# Patient Record
Sex: Female | Born: 1960 | Race: White | Hispanic: No | Marital: Married | Smoking: Former smoker
Health system: Southern US, Community
[De-identification: ages and names within clinical notes are randomized; demographics above are authoritative.]

## PROBLEM LIST (undated history)

## (undated) DIAGNOSIS — I639 Cerebral infarction, unspecified: Secondary | ICD-10-CM

## (undated) DIAGNOSIS — G709 Myoneural disorder, unspecified: Secondary | ICD-10-CM

## (undated) DIAGNOSIS — I4891 Unspecified atrial fibrillation: Secondary | ICD-10-CM

## (undated) DIAGNOSIS — H518 Other specified disorders of binocular movement: Secondary | ICD-10-CM

## (undated) DIAGNOSIS — I10 Essential (primary) hypertension: Secondary | ICD-10-CM

## (undated) DIAGNOSIS — F419 Anxiety disorder, unspecified: Secondary | ICD-10-CM

## (undated) DIAGNOSIS — K219 Gastro-esophageal reflux disease without esophagitis: Secondary | ICD-10-CM

## (undated) DIAGNOSIS — R51 Headache: Secondary | ICD-10-CM

## (undated) DIAGNOSIS — M1990 Unspecified osteoarthritis, unspecified site: Secondary | ICD-10-CM

## (undated) DIAGNOSIS — G8929 Other chronic pain: Secondary | ICD-10-CM

## (undated) DIAGNOSIS — F410 Panic disorder [episodic paroxysmal anxiety] without agoraphobia: Secondary | ICD-10-CM

## (undated) DIAGNOSIS — R519 Headache, unspecified: Secondary | ICD-10-CM

## (undated) DIAGNOSIS — M542 Cervicalgia: Secondary | ICD-10-CM

## (undated) DIAGNOSIS — F329 Major depressive disorder, single episode, unspecified: Secondary | ICD-10-CM

## (undated) DIAGNOSIS — J45909 Unspecified asthma, uncomplicated: Secondary | ICD-10-CM

## (undated) HISTORY — PX: CERVICAL SPINE SURGERY: SHX589

## (undated) HISTORY — PX: BACK SURGERY: SHX140

## (undated) HISTORY — DX: Cerebral infarction, unspecified: I63.9

## (undated) HISTORY — PX: CHOLECYSTECTOMY: SHX55

---

## 2000-04-01 DIAGNOSIS — F32A Depression, unspecified: Secondary | ICD-10-CM

## 2000-04-01 DIAGNOSIS — F410 Panic disorder [episodic paroxysmal anxiety] without agoraphobia: Secondary | ICD-10-CM

## 2000-04-01 HISTORY — DX: Depression, unspecified: F32.A

## 2000-04-01 HISTORY — DX: Panic disorder (episodic paroxysmal anxiety): F41.0

## 2006-11-04 ENCOUNTER — Ambulatory Visit (HOSPITAL_COMMUNITY): Admission: RE | Admit: 2006-11-04 | Discharge: 2006-11-04 | Payer: Self-pay | Admitting: Family Medicine

## 2006-11-04 ENCOUNTER — Encounter: Payer: Self-pay | Admitting: Orthopedic Surgery

## 2007-03-03 ENCOUNTER — Encounter: Payer: Self-pay | Admitting: Orthopedic Surgery

## 2007-03-03 ENCOUNTER — Ambulatory Visit (HOSPITAL_COMMUNITY): Admission: RE | Admit: 2007-03-03 | Discharge: 2007-03-03 | Payer: Self-pay | Admitting: Family Medicine

## 2007-05-11 ENCOUNTER — Ambulatory Visit: Payer: Self-pay | Admitting: Orthopedic Surgery

## 2007-05-11 DIAGNOSIS — M5430 Sciatica, unspecified side: Secondary | ICD-10-CM

## 2007-05-11 DIAGNOSIS — M25569 Pain in unspecified knee: Secondary | ICD-10-CM | POA: Insufficient documentation

## 2007-05-11 DIAGNOSIS — IMO0002 Reserved for concepts with insufficient information to code with codable children: Secondary | ICD-10-CM | POA: Insufficient documentation

## 2007-05-11 DIAGNOSIS — M2240 Chondromalacia patellae, unspecified knee: Secondary | ICD-10-CM

## 2007-05-15 ENCOUNTER — Ambulatory Visit (HOSPITAL_COMMUNITY): Admission: RE | Admit: 2007-05-15 | Discharge: 2007-05-15 | Payer: Self-pay | Admitting: Family Medicine

## 2008-06-03 ENCOUNTER — Ambulatory Visit (HOSPITAL_COMMUNITY): Admission: RE | Admit: 2008-06-03 | Discharge: 2008-06-03 | Payer: Self-pay | Admitting: Family Medicine

## 2008-06-03 ENCOUNTER — Encounter: Payer: Self-pay | Admitting: Orthopedic Surgery

## 2008-06-08 ENCOUNTER — Ambulatory Visit: Payer: Self-pay | Admitting: Orthopedic Surgery

## 2008-08-04 ENCOUNTER — Ambulatory Visit: Payer: Self-pay | Admitting: Orthopedic Surgery

## 2008-10-05 ENCOUNTER — Emergency Department (HOSPITAL_COMMUNITY): Admission: EM | Admit: 2008-10-05 | Discharge: 2008-10-05 | Payer: Self-pay | Admitting: Emergency Medicine

## 2008-11-21 ENCOUNTER — Ambulatory Visit (HOSPITAL_COMMUNITY): Admission: RE | Admit: 2008-11-21 | Discharge: 2008-11-21 | Payer: Self-pay | Admitting: Family Medicine

## 2009-05-31 ENCOUNTER — Ambulatory Visit: Payer: Self-pay | Admitting: Orthopedic Surgery

## 2009-06-12 ENCOUNTER — Telehealth: Payer: Self-pay | Admitting: Orthopedic Surgery

## 2009-08-24 ENCOUNTER — Ambulatory Visit: Payer: Self-pay | Admitting: Orthopedic Surgery

## 2009-10-12 ENCOUNTER — Emergency Department (HOSPITAL_COMMUNITY): Admission: EM | Admit: 2009-10-12 | Discharge: 2009-10-12 | Payer: Self-pay | Admitting: Emergency Medicine

## 2010-05-01 NOTE — Assessment & Plan Note (Signed)
Summary: reck knees/medicare/medicaid/bsf   Visit Type:  Follow-up Referring Provider:  self Primary Provider:  Dr. Janna Arch  CC:  recheck knees.  History of Present Illness: Followup bilateral knee pain in this 50 year old female with previous injections in both knees. History of lumbar disc disease at multiple back surgeries, and epidural injections, complicated by epidural leak requiring a patch.  Has dx of chondromalacia patella, was given relafen last visit.  complains of anterior knee pain and pain with squatting in the front of the knee,  She is about 50% improvement with Relafen 500 mg after 8 weeks. Still some stiffness, and pain in both knees.  System review there is some nerve damage in the RIGHT knee  Meds: Prilosec, Lyrica, Zantac, Losartan HCTZ, Vicodin no relief of knee pain, Ambien, Relafen for knees.  Tomorrow is 2 weeks of not smoking.    Allergies: No Known Drug Allergies  Physical Exam  Skin:  intact without lesions or rashes Psych:  alert and cooperative; normal mood and affect; normal attention span and concentration   Knee Exam  General:    Well-developed, well-nourished, normal body habitus; no deformities, normal grooming.  Gait:    Normal heel-toe gait pattern bilaterally.    Skin:    Intact, no scars, lesions, rashes, cafe au lait spots, or bruising.    Inspection:     No deformity, ecchymosis or swelling.   Palpation:    Non-tender to palpation over medial joint line, lateral joint line, parapatellar, condylar, patellar tendon, or Pes bursa.   Vascular:    There was no swelling or varicose veins. The pulses and temperature are normal. There was no edema or tenderness.  Motor:    Motor strength 5/5 bilaterally for quadriceps, hamstrings, ankle dorsiflexion, and ankle plantar flexion.    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 One by mouth bid  Orders: Est. Patient Level III (62703)  Medications Added to Medication List This Visit: 1)  Nabumetone 750 Mg Tabs (Nabumetone) .... One by mouth bid  Patient Instructions: 1)  continue taking Relafen increase to 750 two times a day 2)  come back in 3 months recheck after med increase Prescriptions: NABUMETONE 750 MG TABS (NABUMETONE) one by mouth bid  #60 x 3   Entered and Authorized by:   [name] MD   Signed by:   [name] MD on 08/24/2009   Method used:   Faxed to ...       [facility]  [address] [address]       [address], [place]  [address]       Ph: [phone]       Fax: [phone]   RxID:   [id number]

## 2010-05-01 NOTE — Assessment & Plan Note (Signed)
Summary: BI KNEE PAIN NEEDS XR/MEDICARE/MEDICAID/BSF   Visit Type:  Follow-up Referring Provider:  self Primary Provider:  Dr. Janna Arch  CC:  bilateral knee pain.  History of Present Illness: followup bilateral knee pain in this 50 year old female with previous injections in both knees. History of lumbar disc disease at multiple back surgeries, and epidural injections, complicated by epidural leak requiring a patch.  Last injections were done on Aug 04, 2008   complains of anterior knee pain and pain with squatting in the front of the knee, has become and has been severe  Review of systems:  there is some nerve damage in the RIGHT knee  Meds: Prilosec, Lyrica, Zantac, Losartan HCTZ, Vicodin no relief of knee pain, Ambien, Naproxen no relief for knees.   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  Past Medical History: Reviewed history from 05/11/2007 and no changes required. hiatal hernia acid reflux high blood pressure  Past Surgical History: Reviewed history from 05/11/2007 and no changes required. none  Family History: Reviewed history from 05/11/2007 and no changes required. Family History of Arthritis FH of Cancer:  Family History of Diabetes  Social History: Reviewed history from 05/11/2007 and no changes required. Patient is single.  housewife  Physical Exam  Additional Exam:  examination reveals a small framed, female, awake, alert, on a x3. Mood and affect. Normal. No gait disturbance.  Inspection revealed bilateral crepitance in both knees. No joint  effusion. No joint line tenderness.  Range of motion remains full. Knee is stable. Strength is normal. Skin is normal.  Sensory exam shows no major deficits. Pulses are excellent.     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  The tibiofemoral articulations are well preserved with mild medial joint space narrowing, the patellofemoral joints look normal.  Impression mild medial joint space narrowing from arthritis.  Her updated medication list for this problem includes:    Nabumetone 500 Mg Tabs (Nabumetone) ..... One by mouth bid  Orders: Est. Patient Level III (04540) Knee x-ray,  3 views (98119) change from Naprosyn to Relafen. No surgical treatment really to address the chondromalacia.  Medications Added to Medication List This Visit: 1)  Nabumetone 500 Mg Tabs (Nabumetone) .... One by mouth bid  Patient Instructions: 1)  Stop Naprosyn 2)  Start new med for 8 weeks 3)  come back in 8 weeks for recheck on knees Prescriptions: NABUMETONE 500 MG TABS (NABUMETONE) one by mouth bid  #60 x 2   Entered and Authorized by:   [name] MD   Signed by:   [name] MD on 05/31/2009   Method used:   Print then Give to Patient   RxID:   [id number]

## 2010-05-01 NOTE — Progress Notes (Signed)
Summary: Medicine not helping her pain  Phone Note Call from Patient   Summary of Call: Alyssa Poole (December 20, 2060) says the Nabumetone 500 mg is not helping her pain, she takes 2 a day.  Wants to know if she needs to come in sooner than her May 4,2011 appointment or can you prescribe something else.  [facility] [place] ph# [phone] Her # [phone] Initial call taken by: Jacklynn Ganong,  June 12, 2009 8:22 AM  Follow-up for Phone Call        no she does not have a surgical problem and will have to deal with the pain  Follow-up by: [name] MD,  June 12, 2009 8:44 AM  Additional Follow-up for Phone Call Additional follow up Details #1::        Left a message for [facility] to call the office Additional Follow-up by: Jacklynn Ganong,  June 12, 2009 9:12 AM

## 2010-07-03 ENCOUNTER — Emergency Department (HOSPITAL_COMMUNITY)
Admission: EM | Admit: 2010-07-03 | Discharge: 2010-07-03 | Disposition: A | Discharge status: Home / Self Care | Payer: Medicare Other | Attending: Emergency Medicine | Admitting: Emergency Medicine

## 2010-07-03 ENCOUNTER — Emergency Department (HOSPITAL_COMMUNITY): Payer: Medicare Other

## 2010-07-03 DIAGNOSIS — K219 Gastro-esophageal reflux disease without esophagitis: Secondary | ICD-10-CM | POA: Insufficient documentation

## 2010-07-03 DIAGNOSIS — E119 Type 2 diabetes mellitus without complications: Secondary | ICD-10-CM | POA: Insufficient documentation

## 2010-07-03 DIAGNOSIS — I10 Essential (primary) hypertension: Secondary | ICD-10-CM | POA: Insufficient documentation

## 2010-07-03 DIAGNOSIS — M1710 Unilateral primary osteoarthritis, unspecified knee: Secondary | ICD-10-CM | POA: Insufficient documentation

## 2010-07-03 DIAGNOSIS — Z79899 Other long term (current) drug therapy: Secondary | ICD-10-CM | POA: Insufficient documentation

## 2010-07-03 DIAGNOSIS — M25569 Pain in unspecified knee: Secondary | ICD-10-CM | POA: Insufficient documentation

## 2010-07-08 LAB — DIFFERENTIAL
Eosinophils Absolute: 0.1 10*3/uL (ref 0.0–0.7)
Eosinophils Relative: 2 % (ref 0–5)
Monocytes Relative: 11 % (ref 3–12)
Neutro Abs: 3.2 10*3/uL (ref 1.7–7.7)
Neutrophils Relative %: 57 % (ref 43–77)

## 2010-07-08 LAB — URINE CULTURE: Colony Count: 30000

## 2010-07-08 LAB — COMPREHENSIVE METABOLIC PANEL
ALT: 19 U/L (ref 0–35)
BUN: 10 mg/dL (ref 6–23)
CO2: 26 mEq/L (ref 19–32)
Creatinine, Ser: 0.61 mg/dL (ref 0.4–1.2)
Sodium: 138 mEq/L (ref 135–145)
Total Bilirubin: 0.4 mg/dL (ref 0.3–1.2)

## 2010-07-08 LAB — CBC
HCT: 33.5 % — ABNORMAL LOW (ref 36.0–46.0)
Hemoglobin: 11.9 g/dL — ABNORMAL LOW (ref 12.0–15.0)
MCHC: 35.7 g/dL (ref 30.0–36.0)
MCV: 96.5 fL (ref 78.0–100.0)
Platelets: 267 10*3/uL (ref 150–400)
RBC: 3.47 MIL/uL — ABNORMAL LOW (ref 3.87–5.11)
RDW: 12.9 % (ref 11.5–15.5)
WBC: 5.6 10*3/uL (ref 4.0–10.5)

## 2010-07-08 LAB — URINALYSIS, ROUTINE W REFLEX MICROSCOPIC
Nitrite: NEGATIVE
Protein, ur: NEGATIVE mg/dL
Specific Gravity, Urine: 1.005 (ref 1.005–1.030)

## 2010-07-08 LAB — POCT CARDIAC MARKERS: Troponin i, poc: <0.05 ng/mL (ref 0.00–0.09)

## 2010-08-23 ENCOUNTER — Emergency Department (HOSPITAL_COMMUNITY)
Admission: EM | Admit: 2010-08-23 | Discharge: 2010-08-23 | Disposition: A | Discharge status: Home / Self Care | Payer: Medicare Other | Attending: Emergency Medicine | Admitting: Emergency Medicine

## 2010-08-23 DIAGNOSIS — K219 Gastro-esophageal reflux disease without esophagitis: Secondary | ICD-10-CM | POA: Insufficient documentation

## 2010-08-23 DIAGNOSIS — M25559 Pain in unspecified hip: Secondary | ICD-10-CM | POA: Insufficient documentation

## 2010-08-23 DIAGNOSIS — I10 Essential (primary) hypertension: Secondary | ICD-10-CM | POA: Insufficient documentation

## 2010-08-29 ENCOUNTER — Other Ambulatory Visit (HOSPITAL_COMMUNITY): Payer: Self-pay | Admitting: Family Medicine

## 2010-08-29 DIAGNOSIS — M5104 Intervertebral disc disorders with myelopathy, thoracic region: Secondary | ICD-10-CM

## 2010-08-30 ENCOUNTER — Ambulatory Visit (HOSPITAL_COMMUNITY)
Admission: RE | Admit: 2010-08-30 | Discharge: 2010-08-30 | Disposition: A | Discharge status: Home / Self Care | Payer: Medicare Other | Source: Ambulatory Visit | Attending: Family Medicine | Admitting: Family Medicine

## 2010-08-30 DIAGNOSIS — M5104 Intervertebral disc disorders with myelopathy, thoracic region: Secondary | ICD-10-CM

## 2010-08-30 DIAGNOSIS — M545 Low back pain, unspecified: Secondary | ICD-10-CM | POA: Insufficient documentation

## 2010-08-30 DIAGNOSIS — M79609 Pain in unspecified limb: Secondary | ICD-10-CM | POA: Insufficient documentation

## 2010-08-30 DIAGNOSIS — M5126 Other intervertebral disc displacement, lumbar region: Secondary | ICD-10-CM | POA: Insufficient documentation

## 2010-09-11 ENCOUNTER — Ambulatory Visit: Payer: Medicare Other | Admitting: Orthopedic Surgery

## 2010-11-21 ENCOUNTER — Other Ambulatory Visit (HOSPITAL_COMMUNITY): Payer: Self-pay | Admitting: Neurosurgery

## 2010-11-21 DIAGNOSIS — M47817 Spondylosis without myelopathy or radiculopathy, lumbosacral region: Secondary | ICD-10-CM

## 2010-11-21 DIAGNOSIS — M545 Low back pain: Secondary | ICD-10-CM

## 2010-11-21 DIAGNOSIS — M48061 Spinal stenosis, lumbar region without neurogenic claudication: Secondary | ICD-10-CM

## 2010-11-21 DIAGNOSIS — M25559 Pain in unspecified hip: Secondary | ICD-10-CM

## 2010-11-27 ENCOUNTER — Ambulatory Visit (HOSPITAL_COMMUNITY)
Admission: RE | Admit: 2010-11-27 | Discharge: 2010-11-27 | Disposition: A | Discharge status: Home / Self Care | Payer: Medicare Other | Source: Ambulatory Visit | Attending: Neurosurgery | Admitting: Neurosurgery

## 2010-11-27 DIAGNOSIS — M545 Low back pain, unspecified: Secondary | ICD-10-CM | POA: Insufficient documentation

## 2010-11-27 DIAGNOSIS — M48061 Spinal stenosis, lumbar region without neurogenic claudication: Secondary | ICD-10-CM

## 2010-11-27 DIAGNOSIS — M5126 Other intervertebral disc displacement, lumbar region: Secondary | ICD-10-CM | POA: Insufficient documentation

## 2010-11-27 DIAGNOSIS — M25559 Pain in unspecified hip: Secondary | ICD-10-CM | POA: Insufficient documentation

## 2010-11-27 DIAGNOSIS — M47817 Spondylosis without myelopathy or radiculopathy, lumbosacral region: Secondary | ICD-10-CM

## 2010-11-27 LAB — CREATININE, SERUM: Creatinine, Ser: 0.74 mg/dL (ref 0.50–1.10)

## 2010-11-27 MED ORDER — GADOBENATE DIMEGLUMINE 529 MG/ML IV SOLN
14.0000 mL | Freq: Once | INTRAVENOUS | Status: AC | PRN
  Administered 2010-11-27: 14 mL via INTRAVENOUS

## 2010-12-05 ENCOUNTER — Other Ambulatory Visit (HOSPITAL_COMMUNITY): Payer: Self-pay | Admitting: Neurosurgery

## 2010-12-05 DIAGNOSIS — M8448XA Pathological fracture, other site, initial encounter for fracture: Secondary | ICD-10-CM

## 2010-12-05 DIAGNOSIS — S3210XA Unspecified fracture of sacrum, initial encounter for closed fracture: Secondary | ICD-10-CM

## 2010-12-07 ENCOUNTER — Encounter (HOSPITAL_COMMUNITY)
Admission: RE | Admit: 2010-12-07 | Discharge: 2010-12-07 | Disposition: A | Discharge status: Home / Self Care | Payer: Medicare Other | Source: Ambulatory Visit | Attending: Neurosurgery | Admitting: Neurosurgery

## 2010-12-07 ENCOUNTER — Encounter (HOSPITAL_COMMUNITY): Payer: Self-pay

## 2010-12-07 ENCOUNTER — Ambulatory Visit (HOSPITAL_COMMUNITY)
Admission: RE | Admit: 2010-12-07 | Discharge: 2010-12-07 | Disposition: A | Discharge status: Home / Self Care | Payer: Medicare Other | Source: Ambulatory Visit | Attending: Neurosurgery | Admitting: Neurosurgery

## 2010-12-07 DIAGNOSIS — R937 Abnormal findings on diagnostic imaging of other parts of musculoskeletal system: Secondary | ICD-10-CM | POA: Insufficient documentation

## 2010-12-07 DIAGNOSIS — S3210XA Unspecified fracture of sacrum, initial encounter for closed fracture: Secondary | ICD-10-CM

## 2010-12-07 DIAGNOSIS — M8448XA Pathological fracture, other site, initial encounter for fracture: Secondary | ICD-10-CM

## 2010-12-07 DIAGNOSIS — M899 Disorder of bone, unspecified: Secondary | ICD-10-CM | POA: Insufficient documentation

## 2010-12-07 DIAGNOSIS — Z780 Asymptomatic menopausal state: Secondary | ICD-10-CM | POA: Insufficient documentation

## 2010-12-07 HISTORY — DX: Essential (primary) hypertension: I10

## 2010-12-07 MED ORDER — TECHNETIUM TC 99M MEDRONATE IV KIT
25.0000 | PACK | Freq: Once | INTRAVENOUS | Status: AC | PRN
  Administered 2010-12-07: 22.6 via INTRAVENOUS

## 2011-01-15 ENCOUNTER — Emergency Department (HOSPITAL_COMMUNITY): Payer: Medicare Other

## 2011-01-15 ENCOUNTER — Inpatient Hospital Stay (HOSPITAL_COMMUNITY)
Admission: EM | Admit: 2011-01-15 | Discharge: 2011-01-18 | DRG: 544 | Disposition: A | Discharge status: Home / Self Care | Payer: Medicare Other | Attending: Family Medicine | Admitting: Family Medicine

## 2011-01-15 ENCOUNTER — Encounter (HOSPITAL_COMMUNITY): Payer: Self-pay | Admitting: *Deleted

## 2011-01-15 DIAGNOSIS — F411 Generalized anxiety disorder: Secondary | ICD-10-CM | POA: Diagnosis present

## 2011-01-15 DIAGNOSIS — M810 Age-related osteoporosis without current pathological fracture: Secondary | ICD-10-CM | POA: Diagnosis present

## 2011-01-15 DIAGNOSIS — M8448XA Pathological fracture, other site, initial encounter for fracture: Principal | ICD-10-CM | POA: Diagnosis present

## 2011-01-15 DIAGNOSIS — IMO0002 Reserved for concepts with insufficient information to code with codable children: Secondary | ICD-10-CM

## 2011-01-15 DIAGNOSIS — M5430 Sciatica, unspecified side: Secondary | ICD-10-CM

## 2011-01-15 DIAGNOSIS — K219 Gastro-esophageal reflux disease without esophagitis: Secondary | ICD-10-CM | POA: Diagnosis present

## 2011-01-15 DIAGNOSIS — J449 Chronic obstructive pulmonary disease, unspecified: Secondary | ICD-10-CM | POA: Diagnosis present

## 2011-01-15 DIAGNOSIS — Z87891 Personal history of nicotine dependence: Secondary | ICD-10-CM

## 2011-01-15 DIAGNOSIS — J4489 Other specified chronic obstructive pulmonary disease: Secondary | ICD-10-CM | POA: Diagnosis present

## 2011-01-15 DIAGNOSIS — M2240 Chondromalacia patellae, unspecified knee: Secondary | ICD-10-CM

## 2011-01-15 DIAGNOSIS — I10 Essential (primary) hypertension: Secondary | ICD-10-CM | POA: Diagnosis present

## 2011-01-15 DIAGNOSIS — M25569 Pain in unspecified knee: Secondary | ICD-10-CM

## 2011-01-15 HISTORY — DX: Anxiety disorder, unspecified: F41.9

## 2011-01-15 LAB — URINALYSIS, ROUTINE W REFLEX MICROSCOPIC
Bilirubin Urine: NEGATIVE
Specific Gravity, Urine: 1.01 (ref 1.005–1.030)
pH: 5.5 (ref 5.0–8.0)

## 2011-01-15 LAB — BASIC METABOLIC PANEL
BUN: 12 mg/dL (ref 6–23)
Chloride: 103 mEq/L (ref 96–112)
Glucose, Bld: 106 mg/dL — ABNORMAL HIGH (ref 70–99)
Potassium: 3.6 mEq/L (ref 3.5–5.1)

## 2011-01-15 LAB — HEPATIC FUNCTION PANEL
AST: 30 U/L (ref 0–37)
Albumin: 3.9 g/dL (ref 3.5–5.2)
Alkaline Phosphatase: 122 U/L — ABNORMAL HIGH (ref 39–117)
Total Bilirubin: 0.2 mg/dL — ABNORMAL LOW (ref 0.3–1.2)

## 2011-01-15 LAB — CBC
HCT: 32.7 % — ABNORMAL LOW (ref 36.0–46.0)
Hemoglobin: 11 g/dL — ABNORMAL LOW (ref 12.0–15.0)
MCHC: 33.6 g/dL (ref 30.0–36.0)

## 2011-01-15 LAB — URINE MICROSCOPIC-ADD ON

## 2011-01-15 LAB — DIFFERENTIAL
Lymphocytes Relative: 9 % — ABNORMAL LOW (ref 12–46)
Monocytes Absolute: 0.2 10*3/uL (ref 0.1–1.0)
Monocytes Relative: 2 % — ABNORMAL LOW (ref 3–12)
Neutro Abs: 7 10*3/uL (ref 1.7–7.7)

## 2011-01-15 MED ORDER — SODIUM CHLORIDE 0.9 % IJ SOLN
INTRAMUSCULAR | Status: AC
  Administered 2011-01-15: 10 mL
  Filled 2011-01-15: qty 10

## 2011-01-15 MED ORDER — ENOXAPARIN SODIUM 40 MG/0.4ML ~~LOC~~ SOLN
40.0000 mg | SUBCUTANEOUS | Status: DC
  Administered 2011-01-15 – 2011-01-18 (×4): 40 mg via SUBCUTANEOUS
  Filled 2011-01-15: qty 0.8
  Filled 2011-01-15 (×3): qty 0.4

## 2011-01-15 MED ORDER — DEXAMETHASONE SODIUM PHOSPHATE 4 MG/ML IJ SOLN
8.0000 mg | Freq: Once | INTRAMUSCULAR | Status: AC
  Administered 2011-01-15: 8 mg via INTRAVENOUS
  Filled 2011-01-15: qty 2

## 2011-01-15 MED ORDER — PROMETHAZINE HCL 12.5 MG PO TABS
12.5000 mg | ORAL_TABLET | Freq: Once | ORAL | Status: AC
  Administered 2011-01-15: 12.5 mg via ORAL
  Filled 2011-01-15: qty 1

## 2011-01-15 MED ORDER — SODIUM CHLORIDE 0.9 % IV SOLN
INTRAVENOUS | Status: DC
  Administered 2011-01-16: 1000 mL via INTRAVENOUS
  Administered 2011-01-16 – 2011-01-18 (×4): via INTRAVENOUS

## 2011-01-15 MED ORDER — HYDROMORPHONE HCL 1 MG/ML IJ SOLN
1.0000 mg | INTRAMUSCULAR | Status: DC | PRN
Start: 2011-01-15 — End: 2011-01-18
  Administered 2011-01-15 – 2011-01-18 (×13): 1 mg via INTRAVENOUS
  Filled 2011-01-15 (×13): qty 1

## 2011-01-15 MED ORDER — OLMESARTAN MEDOXOMIL 20 MG PO TABS
20.0000 mg | ORAL_TABLET | Freq: Every day | ORAL | Status: DC
  Administered 2011-01-15 – 2011-01-18 (×3): 20 mg via ORAL
  Filled 2011-01-15 (×6): qty 1

## 2011-01-15 MED ORDER — SODIUM CHLORIDE 0.9 % IV SOLN
Freq: Once | INTRAVENOUS | Status: AC
  Administered 2011-01-15: 1000 mL via INTRAVENOUS

## 2011-01-15 MED ORDER — MORPHINE SULFATE 10 MG/ML IJ SOLN
8.0000 mg | Freq: Once | INTRAMUSCULAR | Status: AC
  Administered 2011-01-15: 8 mg via INTRAVENOUS
  Filled 2011-01-15: qty 1

## 2011-01-15 NOTE — ED Notes (Signed)
Assisted to bathroom to void--obtained urine specimen and taken to lab.  Rates her pain 6 on 1-10 scale prior to administering IV meds.

## 2011-01-15 NOTE — H&P (Signed)
[id number] 

## 2011-01-15 NOTE — ED Notes (Signed)
Taken to floor via stretcher.  Stable

## 2011-01-15 NOTE — Progress Notes (Signed)
ANTICOAGULATION CONSULT NOTE - Initial Consult  Pharmacy Consult for Lovenox Indication: VTE prophylaxis  Allergies  Allergen Reactions  . Bee Venom Swelling    Causes bad swelling. No epipen  needed    Patient Measurements: Height: 5\' 3"  (160 cm) Weight: 140 lb (63.504 kg) IBW/kg (Calculated) : 52.4    Vital Signs: Temp: 98.4 F (36.9 C) (10/16 1350) Temp src: Oral (10/16 1350) BP: 100/55 mmHg (10/16 1350) Pulse Rate: 64  (10/16 1350)  Labs:  Basename 01/15/11 1221  HGB 11.0*  HCT 32.7*  PLT 485*  APTT --  LABPROT --  INR --  HEPARINUNFRC --  CREATININE 0.69  CKTOTAL --  CKMB --  TROPONINI --   Estimated Creatinine Clearance: 73.8 ml/min (by C-G formula based on Cr of 0.69).  Medical History: Past Medical History  Diagnosis Date  . Hypertension   . Anxiety     Medications:  Scheduled:    . sodium chloride   Intravenous Once  . dexamethasone  8 mg Intravenous Once  . enoxaparin  40 mg Subcutaneous Q24H  . morphine  8 mg Intravenous Once  . olmesartan  20 mg Oral Daily  . promethazine  12.5 mg Oral Once    Assessment: Okay for Protocol  Goal of Therapy:  Prophylaxis Dosing   Plan:  Lovenox 40mg  SQ daily. Dose Stable for AGE, Weight, Renal Function and Indication.  Sign off.   Lamonte Richer R 01/15/2011,2:23 PM

## 2011-01-15 NOTE — ED Notes (Signed)
Pt c/o pain in her tailbone. States that she was told a month ago that she had a broken tail bone. Pt states that the pain is so bad she can't do anything and her Lortab 7.5 mg pain pills are not working.

## 2011-01-15 NOTE — ED Provider Notes (Signed)
History     CSN: [id number] Arrival date & time: 01/15/2011  8:38 AM   None     Chief Complaint  Patient presents with  . Tailbone Pain    (Consider location/radiation/quality/duration/timing/severity/associated sxs/prior treatment) HPI Comments: Pt states she has a broken"tailbone" not related to injury .Marland Kitchen This was about 1 month ago. She states that now she has so much pain she can not walk or get out of bed. She was treated with Lortab 7.5mg , but this is no longer helping. No recent re-injury. No fever. Nausea, but no vomiting. No recent injury.  She is treated by Dr Janna Arch.  The history is provided by the patient.    Past Medical History  Diagnosis Date  . Hypertension   . Anxiety     Past Surgical History  Procedure Date  . Back surgery   . Cervical spine surgery   . Cholecystectomy     History reviewed. No pertinent family history.  History  Substance Use Topics  . Smoking status: Current Everyday Smoker  . Smokeless tobacco: Not on file  . Alcohol Use: No    OB History    Grav Para Term Preterm Abortions TAB SAB Ect Mult Living                  Review of Systems  Constitutional: Negative for activity change.       All ROS Neg except as noted in HPI  HENT: Negative for nosebleeds and neck pain.   Eyes: Negative for photophobia and discharge.  Respiratory: Negative for cough, shortness of breath and wheezing.   Cardiovascular: Negative for chest pain and palpitations.  Gastrointestinal: Negative for abdominal pain and blood in stool.  Genitourinary: Negative for dysuria, frequency and hematuria.  Musculoskeletal: Positive for back pain, arthralgias and gait problem.  Skin: Negative.   Neurological: Negative for dizziness, seizures and speech difficulty.  Psychiatric/Behavioral: Negative for hallucinations and confusion.    Allergies  Bee venom  Home Medications   Current Outpatient Rx  Name Route Sig Dispense Refill  . CALCIUM 600 + D PO  Oral Take 1 tablet by mouth daily.      Marland Kitchen HYDROCODONE-ACETAMINOPHEN 7.5-500 MG PO TABS Oral Take 1 tablet by mouth every 8 (eight) hours.      Marland Kitchen LOSARTAN POTASSIUM-HCTZ 50-12.5 MG PO TABS Oral Take 1 tablet by mouth daily.      Marland Kitchen MECLIZINE HCL 25 MG PO TABS Oral Take 25 mg by mouth 3 (three) times daily as needed. For dizziness     . MELOXICAM 15 MG PO TABS Oral Take 15 mg by mouth daily.      Marland Kitchen NAPROXEN 500 MG PO TABS Oral Take 500 mg by mouth 2 (two) times daily with a meal.      . OMEPRAZOLE 40 MG PO CPDR Oral Take 40 mg by mouth daily.      Marland Kitchen PAROXETINE HCL 20 MG PO TABS Oral Take 20 mg by mouth every morning.        BP 125/86  Pulse 63  Temp(Src) 97.8 F (36.6 C) (Oral)  Resp 16  Ht 5\' 3"  (1.6 m)  Wt 140 lb (63.504 kg)  BMI 24.80 kg/m2  SpO2 99%  Physical Exam  Nursing note and vitals reviewed. Constitutional: She is oriented to person, place, and time. She appears well-developed and well-nourished.  Non-toxic appearance.  HENT:  Head: Normocephalic.  Right Ear: Tympanic membrane and external ear normal.  Left Ear: Tympanic membrane and external  ear normal.  Eyes: EOM and lids are normal. Pupils are equal, round, and reactive to light.  Neck: Normal range of motion. Neck supple. Carotid bruit is not present.  Cardiovascular: Normal rate, regular rhythm, normal heart sounds, intact distal pulses and normal pulses.   Pulmonary/Chest: Breath sounds normal. No respiratory distress.  Abdominal: Soft. Bowel sounds are normal. There is no tenderness. There is no guarding.  Musculoskeletal: She exhibits tenderness.       Pain (tears) with movement of the hips or movement of the pelvis. Distal pulse symmetrical. Pain to palpation of the L- spine.  Lymphadenopathy:       Head (right side): No submandibular adenopathy present.       Head (left side): No submandibular adenopathy present.    She has no cervical adenopathy.  Neurological: She is alert and oriented to person, place, and  time. She has normal strength. No cranial nerve deficit or sensory deficit.  Skin: Skin is warm and dry.  Psychiatric: She has a normal mood and affect. Her speech is normal.    ED Course: Test results discussed with pt. Case discussed with Dr Janna Arch. He will see pt for admission in the ED.  Procedures (including critical care time)  Labs Reviewed - No data to display Ct Pelvis Wo Contrast  01/15/2011  *RADIOLOGY REPORT*  Clinical Data:  Low back and tail bone pain.  History of osteopenia and sacral insufficiency fracture.  CT PELVIS WITHOUT CONTRAST  Technique:  Multidetector CT imaging of the pelvis was performed following the standard protocol without intravenous contrast.  Comparison:  Whole body bone scan 12/07/2010, lumbar and hip MRIs 11/27/2010.  Findings:  As demonstrated on prior examinations, there is an extensive vertical insufficiency fracture of the right sacral ala. The fracture line remains apparent with irregular surrounding sclerosis.  No sacral neural foraminal displacement is demonstrated.  There is a more simple appearing contralateral left sacral fracture without associated sclerosis.  There is no asymmetric widening of the sacroiliac joints.  Both sacroiliac joints demonstrate mild vacuum phenomenon.  There are nondisplaced fractures of the right superior and inferior pubic rami.  Mild degenerative changes at the symphysis pubis are not associated with any diastasis.  The coccyx appears intact. There is stable chronic degenerative disc disease at L5-S1.  There is no evidence of proximal femur fracture or dislocation. There is no pelvic hematoma.  The piriformis muscles appear symmetric.  IMPRESSION:  1.  No demonstrated healing of previously described right sacral alar insufficiency fracture.  There is a nondisplaced contralateral fracture without adjacent sclerosis. 2.  Insufficiency fractures of the right superior and inferior pubic rami. 3.  Intact coccyx.  Original Report  Authenticated By: Gerrianne Scale, M.D.     Dx: Multiple fractures of the sacral area.  2. Osteopenia    MDM  Review of bone scans and screening test reveal osteoporosis. CT today suggest on one fracture of the sacral, but 3 fractures. Pt to be admitted by Dr Janna Arch.        Kathie Dike, [place] 01/29/11 [phone]

## 2011-01-15 NOTE — Consult Note (Signed)
Reason for Consult:Sacral deficiency fractures and pelvic fractures Referring Physician: Rodney Booze is an 50 y.o. female.  HPI: She has a four to six week history of lower back pain and tenderness with some left sided radiation at times. Prior studies of MRI showed sacral alar insufficiency fracture secondary to severe osteoporosis.  Her mother, aunt and sister have significant osteoporosis.  She denies any falls or trauma. She has no bowel or bladder problems.  She saw Dr. Venetia Maxon, neurosurgeon at [facility], about three weeks ago.  He recommended light exercise and pain medicine.  He told her that if her symptoms did not get better, she might be a candidate for placing bone cement in the sacral area to help her pain.  She did not want any procedure then.  Over the last several days she had more pain on the right side as well.  New CT scan shows a right sacral ala insufficiency fracture now.  She is very uncomfortable and in pain.  She says she may consider having the bone cement if she does not get better.  She has left sided paresthesias at times.  Past Medical History  Diagnosis Date  . Hypertension   . Anxiety     Past Surgical History  Procedure Date  . Back surgery   . Cervical spine surgery   . Cholecystectomy     History reviewed. No pertinent family history.  Social History:  reports that she has been smoking.  She does not have any smokeless tobacco history on file. She reports that she does not drink alcohol or use illicit drugs.  Allergies:  Allergies  Allergen Reactions  . Bee Venom Swelling    Causes bad swelling. No epipen  needed    Medications: I have reviewed the patient's current medications.  Results for orders placed during the hospital encounter of 01/15/11 (from the past 48 hour(s))  CBC     Status: Abnormal   Collection Time   01/15/11 12:21 PM      Component Value Range Comment   WBC 8.3  4.0 - 10.5 (K/uL)    RBC 3.49 (*) 3.87 - 5.11  (MIL/uL)    Hemoglobin 11.0 (*) 12.0 - 15.0 (g/dL)    HCT 45.4 (*) 09.8 - 46.0 (%)    MCV 93.7  78.0 - 100.0 (fL)    MCH 31.5  26.0 - 34.0 (pg)    MCHC 33.6  30.0 - 36.0 (g/dL)    RDW 11.9  14.7 - 82.9 (%)    Platelets 485 (*) 150 - 400 (K/uL)   BASIC METABOLIC PANEL     Status: Abnormal   Collection Time   01/15/11 12:21 PM      Component Value Range Comment   Sodium 140  135 - 145 (mEq/L)    Potassium 3.6  3.5 - 5.1 (mEq/L)    Chloride 103  96 - 112 (mEq/L)    CO2 29  19 - 32 (mEq/L)    Glucose, Bld 106 (*) 70 - 99 (mg/dL)    BUN 12  6 - 23 (mg/dL)    Creatinine, Ser 5.62  0.50 - 1.10 (mg/dL)    Calcium 9.9  8.4 - 10.5 (mg/dL)    GFR calc non Af Amer >90  >90 (mL/min)    GFR calc Af Amer >90  >90 (mL/min)   DIFFERENTIAL     Status: Abnormal   Collection Time   01/15/11 12:21 PM      Component Value  Range Comment   Neutrophils Relative 85 (*) 43 - 77 (%)    Neutro Abs 7.0  1.7 - 7.7 (K/uL)    Lymphocytes Relative 9 (*) 12 - 46 (%)    Lymphs Abs 0.8  0.7 - 4.0 (K/uL)    Monocytes Relative 2 (*) 3 - 12 (%)    Monocytes Absolute 0.2  0.1 - 1.0 (K/uL)    Eosinophils Relative 3  0 - 5 (%)    Eosinophils Absolute 0.2  0.0 - 0.7 (K/uL)    Basophils Relative 1  0 - 1 (%)    Basophils Absolute 0.1  0.0 - 0.1 (K/uL)   HEPATIC FUNCTION PANEL     Status: Abnormal   Collection Time   01/15/11 12:21 PM      Component Value Range Comment   Total Protein 7.5  6.0 - 8.3 (g/dL)    Albumin 3.9  3.5 - 5.2 (g/dL)    AST 30  0 - 37 (U/L)    ALT 27  0 - 35 (U/L)    Alkaline Phosphatase 122 (*) 39 - 117 (U/L)    Total Bilirubin 0.2 (*) 0.3 - 1.2 (mg/dL)    Bilirubin, Direct <1.6  0.0 - 0.3 (mg/dL)    Indirect Bilirubin NOT CALCULATED  0.3 - 0.9 (mg/dL)   URINALYSIS, ROUTINE W REFLEX MICROSCOPIC     Status: Abnormal   Collection Time   01/15/11 12:51 PM      Component Value Range Comment   Color, Urine STRAW (*) YELLOW     Appearance CLEAR  CLEAR     Specific Gravity, Urine 1.010  1.005  - 1.030     pH 5.5  5.0 - 8.0     Glucose, UA NEGATIVE  NEGATIVE (mg/dL)    Hgb urine dipstick TRACE (*) NEGATIVE     Bilirubin Urine NEGATIVE  NEGATIVE     Ketones, ur NEGATIVE  NEGATIVE (mg/dL)    Protein, ur NEGATIVE  NEGATIVE (mg/dL)    Urobilinogen, UA 0.2  0.0 - 1.0 (mg/dL)    Nitrite NEGATIVE  NEGATIVE     Leukocytes, UA NEGATIVE  NEGATIVE    URINE MICROSCOPIC-ADD ON     Status: Normal   Collection Time   01/15/11 12:51 PM      Component Value Range Comment   Squamous Epithelial / LPF RARE  RARE     WBC, UA 0-2  <3 (WBC/hpf)     Ct Pelvis Wo Contrast  01/15/2011  *RADIOLOGY REPORT*  Clinical Data:  Low back and tail bone pain.  History of osteopenia and sacral insufficiency fracture.  CT PELVIS WITHOUT CONTRAST  Technique:  Multidetector CT imaging of the pelvis was performed following the standard protocol without intravenous contrast.  Comparison:  Whole body bone scan 12/07/2010, lumbar and hip MRIs 11/27/2010.  Findings:  As demonstrated on prior examinations, there is an extensive vertical insufficiency fracture of the right sacral ala. The fracture line remains apparent with irregular surrounding sclerosis.  No sacral neural foraminal displacement is demonstrated.  There is a more simple appearing contralateral left sacral fracture without associated sclerosis.  There is no asymmetric widening of the sacroiliac joints.  Both sacroiliac joints demonstrate mild vacuum phenomenon.  There are nondisplaced fractures of the right superior and inferior pubic rami.  Mild degenerative changes at the symphysis pubis are not associated with any diastasis.  The coccyx appears intact. There is stable chronic degenerative disc disease at L5-S1.  There is no evidence of  proximal femur fracture or dislocation. There is no pelvic hematoma.  The piriformis muscles appear symmetric.  IMPRESSION:  1.  No demonstrated healing of previously described right sacral alar insufficiency fracture.  There is a  nondisplaced contralateral fracture without adjacent sclerosis. 2.  Insufficiency fractures of the right superior and inferior pubic rami. 3.  Intact coccyx.  Original Report Authenticated By: Gerrianne Scale, M.D.    Review of Systems  HENT: Negative.   Eyes: Negative.   Respiratory: Negative.   Cardiovascular: Negative.   Gastrointestinal: Negative.   Genitourinary: Negative.   Musculoskeletal: Positive for back pain (lower sacrum and pelvis and left hip area) and joint pain.  Skin: Negative.   Neurological: Positive for weakness.  Endo/Heme/Allergies: Negative.   Psychiatric/Behavioral: Negative.    Blood pressure 108/65, pulse 55, temperature 97.5 F (36.4 C), temperature source Oral, resp. rate 20, height 5\' 3"  (1.6 m), weight 63.504 kg (140 lb), SpO2 95.00%. Physical Exam  Constitutional: She is oriented to person, place, and time. She appears well-developed and well-nourished.  HENT:  Head: Normocephalic and atraumatic.  Eyes: Conjunctivae are normal. Pupils are equal, round, and reactive to light.  Neck: Normal range of motion. Neck supple.  Cardiovascular: Normal rate, regular rhythm and normal heart sounds.   Respiratory: Effort normal and breath sounds normal.  GI: Soft. Bowel sounds are normal.  Musculoskeletal: She exhibits tenderness (lower back more to the left).       Back:  Neurological: She is alert and oriented to person, place, and time. She has normal reflexes.  Skin: Skin is warm and dry.  Psychiatric: She has a normal mood and affect. Her behavior is normal. Judgment and thought content normal.    Assessment/Plan: Severe osteoporosis. Bilateral sacral ala fractures of insufficiency secondary to osteoporosis. Pelvic fractures nondisplaced pubic rami on the right.  Bed rest for now.  Analgesia for now.  Consider re-consulting Dr. Venetia Maxon for placement of bone cement to decrease pain and give more stability to fracture area.  Tonie Vizcarrondo 01/15/2011,  5:03 PM

## 2011-01-15 NOTE — ED Notes (Signed)
Patient is resting comfortably. 

## 2011-01-15 NOTE — ED Notes (Addendum)
Dr Janna Arch here and in to see pt

## 2011-01-15 NOTE — H&P (Signed)
NAMESANDIA, PFUND NO.:  [id number]  MEDICAL RECORD NO.:  [id number]  LOCATION:  [facility]                         FACILITY:  [facility]  PHYSICIAN:  Melvyn Novas, MDDATE OF BIRTH:  [birth year]  DATE OF ADMISSION:  01/15/2011 DATE OF DISCHARGE:  LH                             HISTORY & PHYSICAL   The patient is a 50 year old white female who has a long-standing history of smoking with osteoporosis who developed a sacral alar insufficiency fracture 3-4 weeks ago, was given conservative therapy and opioid analgesics.  The patient complains of demonstrably worse pain over the last 3-4 days.  Pain is described as sacral area and also pelvic area.  She was seen and evaluated in the hospital.  She denies any fall or recent trauma since the initial fracture which was due to a fall.  A CT scan reveals no demonstrable healing of the right sacral alar insufficiency fracture and with a new nondisplaced contralateral fracture without sclerosis, they were also mentioned insufficiency fractures of the right superior and inferior pubic rami with an intact coccyx per CT scan.  The patient is in demonstrable pain.  Denies any hematuria, dyspnea, chest pain, palpitations, dizziness or syncope.  She was admitted for intravenous opioid analgesia and Orthopedic consultation to see if surgery is an option for these versus conservative therapy.  PAST MEDICAL HISTORY:  Significant for hypertension, GERD, anxiety, osteoporosis, COPD.  PAST SURGICAL HISTORY:  Remarkable for cholecystectomy.  ALLERGIES:  She has no known allergies.  SOCIAL HISTORY:  She lives with her boyfriend.  She has a 27 year old child.  She is unemployed.  Smokes a pack per day up until 3 weeks ago she quit smoking with Chantix.  CURRENT MEDICINES: 1. Prevacid 30 mg p.o. daily. 2. Losartan 50/12.5 p.o. daily. 3. Paxil 20 mg p.o. daily. 4. Lortab 7.5/500 p.o. t.i.d. 5. Caltrate 600-400 p.o.  daily.  PHYSICAL EXAM:  VITAL SIGNS:  Blood pressure 125/86, temperature is 97.8, pulse is 63 and regular, respiratory rate is 16. HEENT:  Head, normocephalic atraumatic.  Eyes, PERRLA, extraocular movements intact.  Sclerae clear.  Conjunctivae pink. NECK:  Shows no JVD.  No carotid bruits.  No thyromegaly or thyroid bruits. LUNGS:  Prolonged expiratory phase.  No rales, wheeze, or rhonchi appreciable.  Diminished breath sounds at bases.  HEART:  Regular rhythm.  No murmurs, gallops, heaves, thrills, or rubs. ABDOMEN:  Soft, nontender.  Bowel sounds normoactive.  No guarding, rebound, mass or organomegaly. PELVIC:  Shows pain on flexion of left and right legs any motion of both legs.  EXTREMITIES:  No clubbing, cyanosis, or edema. NEUROLOGIC:  Cranial nerves II through XII are grossly intact.  The patient moves all 4 extremities.  Plantars are downgoing.  Sensory motor 5/5 in all extremities.  IMPRESSION:  As follows, 1. Right sacral alar insufficiency fracture of 2-3 weeks' duration,     insufficiency fractures of right superior and inferior pubic rami     and a nondisplaced contralateral fracture of the left sacral alar. 2. Hypertension, well controlled. 3. Chronic obstructive pulmonary disease, well controlled. 4. Anxiety. 5. Osteoporosis. 6. Gastroesophageal reflux disease.  PLAN:  The plan at present is to  give intravenous opioid analgesia in the form of Dilaudid 1 mg IV.  We will obtain Orthopedic consultation and we will add Lovenox for DVT prophylaxis.  We will make further recommendations as the database expands.     Melvyn Novas, MD     RMD/MEDQ  D:  01/15/2011  T:  01/15/2011  Job:  [id number]

## 2011-01-15 NOTE — ED Notes (Signed)
Resting on carrier---reports she is finally able to get some rest 2nd to pain relief--Now rates her pain a 2 on 1-10 scale.  Awaiting admission to floor

## 2011-01-15 NOTE — ED Notes (Signed)
Report called to [facility]

## 2011-01-15 NOTE — ED Notes (Signed)
Resting on carrier with eyes closed--awakens with verbal and reports feeling much improved--Husband at bedside.

## 2011-01-15 NOTE — ED Notes (Signed)
Dr. Hilda Lias returned call, was informed that Dr. Delbert Harness would like for him to see pt. On 3rd floor.

## 2011-01-15 NOTE — ED Notes (Signed)
Assisted up to bathroom to void  B/P 94/51  HR  55  T 98.5  P.O. 97%--Awaiting transfer to floor

## 2011-01-16 LAB — PROTIME-INR
INR: 1.08 (ref 0.00–1.49)
Prothrombin Time: 14.2 seconds (ref 11.6–15.2)

## 2011-01-16 MED ORDER — PAROXETINE HCL 20 MG PO TABS
20.0000 mg | ORAL_TABLET | Freq: Every day | ORAL | Status: DC
  Administered 2011-01-16 – 2011-01-18 (×3): 20 mg via ORAL
  Filled 2011-01-16 (×3): qty 1

## 2011-01-16 NOTE — Progress Notes (Signed)
[id number] 

## 2011-01-16 NOTE — Progress Notes (Signed)
NAMEMIKALA, PODOLL               ACCOUNT NO.:  [id number]  MEDICAL RECORD NO.:  [id number]  LOCATION:  [facility]                         FACILITY:  [facility]  PHYSICIAN:  Melvyn Novas, MDDATE OF BIRTH:  1960-07-14  DATE OF PROCEDURE: DATE OF DISCHARGE:                                PROGRESS NOTE   Blood pressure 99/52, temperature 98.2, respiratory rate is 16, pulse is 67 and regular.  Hemoglobin 11, white count 8.3, creatinine 0.69. Currently on Lovenox and Benicar 20 for control of hypertension. Problems are described as right sacral ala fracture with contralateral nondisplaced fracture and insufficiency fractures of superior and inferior pubic rami, as well as hypertension, COPD, osteoporosis, anxiety, and GERD.  The patient is resting quietly, currently on Dilaudid 1 mg IV q.3 hours p.r.n.  Lungs are clear.  No rales, no wheeze.  Heart, regular rhythm.  No S3, S4, or gallops.  Abdomen is soft, nontender.  There is no hematuria noted.  Believed Orthopedics saw the patient yesterday, did not see a note present.  We will continue current analgesic therapy and anti-thrombolytic therapy and await Orthopedic consultation.  Continue current therapy.     Melvyn Novas, MD     RMD/MEDQ  D:  01/16/2011  T:  01/16/2011  Job:  [id number]

## 2011-01-16 NOTE — Progress Notes (Signed)
She has much less pain on the IV pain medicine.  I have again recommended she strongly consider the placement of bone cement.  NV is intact.  A pain patch could be tried if she does not want surgical procedure.  By the way, I did see her yesterday and a note was done just after 5pm.

## 2011-01-17 NOTE — Progress Notes (Signed)
[id number] 

## 2011-01-17 NOTE — Patient Instructions (Signed)
Pt was instructed in use of heat and cold to help manage pain as well as positioning of LEs in bed to relieve back and hip pain.  Pt understands teaching

## 2011-01-17 NOTE — Progress Notes (Signed)
Physical Therapy Evaluation Patient Details Name: Alyssa Poole MRN: [id number] DOB: [birth year] Today's Date: 01/17/2011  Problem List:  Patient Active Problem List  Diagnoses  . CHONDROMALACIA PATELLA  . KNEE PAIN  . DISC DEGENERATION  . SCIATICA    Past Medical History:  Past Medical History  Diagnosis Date  . Hypertension   . Anxiety    Past Surgical History:  Past Surgical History  Procedure Date  . Back surgery   . Cervical spine surgery   . Cholecystectomy     PT Assessment/Plan/Recommendation PT Assessment Clinical Impression Statement: pt with slow healing pelvic fx's benefitting from use of walker to off-load body weight onto walker...demonstrates fair proficiency with walker, no increase in pain from gait.Marland Kitchenwill pklan to see once more for reinforcing gait pattern and safety...recommend HHPT for home safety check PT Recommendation/Assessment: Patient will need skilled PT in the acute care venue PT Problem List: Decreased knowledge of use of DME Barriers to Discharge: None PT Therapy Diagnosis : Difficulty walking PT Plan PT Frequency: Min 1X/week PT Treatment/Interventions: DME instruction;Gait training PT Recommendation Follow Up Recommendations: Home health PT Equipment Recommended: Rolling walker with 5" wheels PT Goals  Acute Rehab PT Goals PT Goal Formulation: With patient/family Time For Goal Achievement: 1 day Pt will Ambulate: 51 - 150 feet;with modified independence  PT Evaluation Precautions/Restrictions  Precautions Required Braces or Orthoses: No Restrictions Weight Bearing Restrictions: No RLE Weight Bearing:  (pt is actually WBAT per pelvic fx protocol) LLE Weight Bearing: Weight bearing as tolerated Prior Functioning  Home Living Lives With: Spouse Receives Help From: Family Type of Home: Mobile home Home Layout: One level Home Access: Level entry Bathroom Shower/Tub: Engineer, manufacturing systems: Standard Bathroom  Accessibility: Yes How Accessible: Accessible via walker Home Adaptive Equipment: Straight cane Prior Function Level of Independence: Independent with basic ADLs;Independent with gait;Independent with transfers;Independent with homemaking with ambulation Driving: Yes Cognition Cognition Arousal/Alertness: Awake/alert Overall Cognitive Status: Appears within functional limits for tasks assessed Sensation/Coordination Sensation Light Touch: Appears Intact Proprioception: Appears Intact Extremity Assessment RUE Assessment RUE Assessment: Within Functional Limits LUE Assessment LUE Assessment: Within Functional Limits RLE Assessment RLE Assessment: Within Functional Limits LLE Assessment LLE Assessment: Within Functional Limits Mobility (including Balance) Bed Mobility Bed Mobility: Yes (independent-instructed in log rolling) Transfers Transfers: Yes Sit to Stand: 7: Independent Stand to Sit: 7: Independent Ambulation/Gait Ambulation/Gait: Yes Ambulation/Gait Assistance: 5: Supervision Ambulation/Gait Assistance Details (indicate cue type and reason): instructed in proper use of walker, WBAT Ambulation Distance (Feet): 80 Feet Assistive device: Rolling walker Gait Pattern: Within Functional Limits Stairs: No Wheelchair Mobility Wheelchair Mobility: No  Posture/Postural Control Posture/Postural Control: No significant limitations Balance Balance Assessed: No Exercise    End of Session PT - End of Session Equipment Utilized During Treatment: Gait belt Activity Tolerance: Patient tolerated treatment well (no increase in pain with gait) Patient left: in bed General Behavior During Session: [facility] for tasks performed Cognition: [facility] for tasks performed  Konrad Penta 01/17/2011, 11:12 AM

## 2011-01-17 NOTE — Progress Notes (Signed)
Physical Therapy Treatment Patient Details Name: Alyssa Poole MRN: [id number] DOB: 18-Jun-1960 Today's Date: 01/17/2011  PT Assessment/Plan  PT - Assessment/Plan Comments on Treatment Session: Acute PT goals met PT Plan: Discharge plan remains appropriate PT Frequency: Min 1X/week Follow Up Recommendations: Home health PT Equipment Recommended: Rolling walker with 5" wheels PT Goals  Acute Rehab PT Goals PT Goal Formulation: With patient/family Time For Goal Achievement: 1 day Pt will Ambulate: 51 - 150 feet;with modified independence PT Goal: Ambulate - Progress: Met  PT Treatment Precautions/Restrictions  Precautions Required Braces or Orthoses: No Restrictions Weight Bearing Restrictions: No RLE Weight Bearing:  (pt is actually WBAT per pelvic fx protocol) LLE Weight Bearing: Weight bearing as tolerated Mobility (including Balance) Bed Mobility Bed Mobility:  (reinstructed in log rolling) Transfers Transfers: Yes Sit to Stand: 7: Independent Stand to Sit: 7: Independent Ambulation/Gait Ambulation/Gait: Yes Ambulation/Gait Assistance: 6: Modified independent (Device/Increase time) Ambulation/Gait Assistance Details (indicate cue type and reason): instructed in proper use of walker, WBAT Ambulation Distance (Feet): 200 Feet Assistive device: Rolling walker Gait Pattern: Within Functional Limits Stairs: No Wheelchair Mobility Wheelchair Mobility: No  Posture/Postural Control Posture/Postural Control: No significant limitations Balance Balance Assessed: No Exercise    End of Session PT - End of Session Equipment Utilized During Treatment: Gait belt Activity Tolerance: Patient tolerated treatment well Patient left: in bed;with call bell in reach;with family/visitor present Nurse Communication:  (walder left in room for pt to use with staff assist) General Behavior During Session: [facility] for tasks performed Cognition: [facility] for tasks performed  Konrad Penta 01/17/2011, 2:04 PM

## 2011-01-17 NOTE — Progress Notes (Signed)
NAMEJORIE, ZEE               ACCOUNT NO.:  [id number]  MEDICAL RECORD NO.:  [id number]  LOCATION:  [facility]                         FACILITY:  [facility]  PHYSICIAN:  Melvyn Novas, MDDATE OF BIRTH:  [birth year]  DATE OF PROCEDURE: DATE OF DISCHARGE:                                PROGRESS NOTE   SUBJECTIVE:  The patient with multiple nondisplaced fractures of the pubic rami and sacrum due to osteoporosis, longstanding smoking.  She has hypertension and generalized anxiety as well as COPD.  She is hemodynamically stable.  OBJECTIVE:  VITAL SIGNS:  Blood pressure 124/74, afebrile. GENERAL:  She has been bed rest since admission 48 hours ago. NECK:  No JVD. LUNGS:  Prolonged expiratory phase.  No rales, wheeze, or rhonchi appreciable. HEART:  Regular rhythm.  No murmurs, gallops, or rubs.  PLAN:  At present is to increase medical therapy for osteoporosis in form of Fosamax 70 mg p.o. every week, continue calcium plus vitamin D 600/400 and we will have Physical Therapy to see how the patient ambulates today, perhaps a four-point walker for home use and to continue home physical therapy.  She was advised of the importance of smoking cessation, and she seems to understand this.  If the patient ambulates with a four-point walker, we will feel free to discharge her within 24 hours.     Melvyn Novas, MD     RMD/MEDQ  D:  01/17/2011  T:  01/17/2011  Job:  [id number]

## 2011-01-18 NOTE — Progress Notes (Signed)
Pt discharged with instructions and verbalized understanding.  She had her walker for home, and left the floor via w/c in stable condition, with staff and family.

## 2011-01-18 NOTE — Discharge Summary (Signed)
NAMEMEIA, EMLEY               ACCOUNT NO.:  [id number]  MEDICAL RECORD NO.:  [id number]  LOCATION:  [facility]                         [facility]:  [facility]  PHYSICIAN:  Melvyn Novas, MDDATE OF BIRTH:  05-19-1960  DATE OF ADMISSION:  01/15/2011 DATE OF DISCHARGE:  LH                              DISCHARGE SUMMARY   The patient is a 50 year old white female with significant osteoporosis, hypertension, anxiety, and COPD from smoking who was admitted with sacral fracture diagnosed 3 weeks ago with increasing pain, she had a contralateral sacral alar nondisplaced fracture and nondisplaced fracture of the inferior pubic rami bilaterally.  She has severe pain, was admitted for analgesia, opioid intravenously as well as anticoagulation.  The patient was seen in consultation by Orthopedics, Dr. Hilda Lias who recommended cementing kyphoplasty.  The patient has now elected to this.  Risks and benefits was explained.  She was hemodynamically stable in the hospital.  Lab work was essentially within normal limits.  Her anxiety was well controlled.  The patient was subsequently discharged on the following medicines: 1. Calcium plus vitamin D 600/400 p.o. daily. 2. Hydrocodone 7.5/500 p.o. q.i.d. p.r.n. 3. Losartan 50/12.5 p.o. daily. 4. Meclizine 25 mg p.o. t.i.d. p.r.n. for vertigo. 5. Naprosyn 500 mg p.o. b.i.d. 6. Prilosec 40 mg p.o. daily. 7. Paxil 20 mg p.o. daily.  She will likewise have medicines for osteoporosis administered as an outpatient.  She will have outpatient physical therapy.     Melvyn Novas, MD     RMD/MEDQ  D:  01/18/2011  T:  01/18/2011  Job:  [id number]

## 2011-01-31 NOTE — ED Provider Notes (Signed)
Medical screening examination/treatment/procedure(s) were performed by non-physician practitioner and as supervising physician I was immediately available for consultation/collaboration.   Laray Anger, DO 01/31/11 1452

## 2011-02-23 ENCOUNTER — Emergency Department (HOSPITAL_COMMUNITY): Payer: Medicare Other

## 2011-02-23 ENCOUNTER — Emergency Department (HOSPITAL_COMMUNITY)
Admission: EM | Admit: 2011-02-23 | Discharge: 2011-02-23 | Disposition: A | Discharge status: Home / Self Care | Payer: Medicare Other | Attending: Emergency Medicine | Admitting: Emergency Medicine

## 2011-02-23 ENCOUNTER — Encounter (HOSPITAL_COMMUNITY): Payer: Self-pay | Admitting: *Deleted

## 2011-02-23 DIAGNOSIS — I10 Essential (primary) hypertension: Secondary | ICD-10-CM | POA: Insufficient documentation

## 2011-02-23 DIAGNOSIS — F411 Generalized anxiety disorder: Secondary | ICD-10-CM | POA: Insufficient documentation

## 2011-02-23 DIAGNOSIS — M7730 Calcaneal spur, unspecified foot: Secondary | ICD-10-CM

## 2011-02-23 DIAGNOSIS — H5440 Blindness, one eye, unspecified eye: Secondary | ICD-10-CM | POA: Insufficient documentation

## 2011-02-23 DIAGNOSIS — F17200 Nicotine dependence, unspecified, uncomplicated: Secondary | ICD-10-CM | POA: Insufficient documentation

## 2011-02-23 MED ORDER — HYDROCODONE-ACETAMINOPHEN 5-325 MG PO TABS: 2.0000 | ORAL_TABLET | ORAL | Status: AC | PRN

## 2011-02-23 NOTE — ED Notes (Signed)
Pt c/o right side foot pain that has been hurting for 2-3 weeks. Pt describes pain as sharp and increases with down pressure.

## 2011-02-23 NOTE — ED Provider Notes (Signed)
Medical screening examination/treatment/procedure(s) were performed by non-physician practitioner and as supervising physician I was immediately available for consultation/collaboration.   Ulas Zuercher, MD 02/23/11 1049 

## 2011-02-23 NOTE — ED Provider Notes (Signed)
History     CSN: [id number] Arrival date & time: 02/23/2011  9:03 AM   None     Chief Complaint  Patient presents with  . Foot Pain    HPI Alyssa Poole is a 50 y.o. female who presents to the ED with right foot pain. The pain started about 3 weeks ago and now is hurting a lot. The pain is sharpe in nature and increases with walking or pressure to the heel. The patient states that she has "weak bones" and is afraid that she may have a break. Uses walker or cane for assistance due to fractures in lumbar spine.   Past Medical History  Diagnosis Date  . Hypertension   . Anxiety     Past Surgical History  Procedure Date  . Back surgery   . Cervical spine surgery   . Cholecystectomy     History reviewed. No pertinent family history.  History  Substance Use Topics  . Smoking status: Current Everyday Smoker  . Smokeless tobacco: Not on file  . Alcohol Use: No    OB History    Grav Para Term Preterm Abortions TAB SAB Ect Mult Living                  Review of Systems  Constitutional: Negative for fever, chills, diaphoresis and fatigue.  HENT: Negative for ear pain, congestion, sore throat, neck pain, neck stiffness, dental problem and sinus pressure.   Eyes: Negative for photophobia, pain and discharge. 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  Respiratory: Negative for cough, chest tightness and wheezing.   Cardiovascular: Negative.   Gastrointestinal: Negative for nausea, vomiting, abdominal pain, diarrhea, constipation and abdominal distention.  Genitourinary: Negative for dysuria, frequency, flank pain and difficulty urinating.  Musculoskeletal: Positive for back pain. Negative for myalgias and gait problem.       Right heel pain.  Skin: Negative for color change and rash.  Neurological: Negative for dizziness, speech difficulty, weakness, light-headedness, numbness and headaches.  Psychiatric/Behavioral: Negative for confusion and agitation. The patient is  nervous/anxious.        Depression     Allergies  Bee venom  Home Medications   Current Outpatient Rx  Name Route Sig Dispense Refill  . CALCIUM 600 + D PO Oral Take 1 tablet by mouth daily.      Marland Kitchen HYDROCODONE-ACETAMINOPHEN 7.5-500 MG PO TABS Oral Take 1 tablet by mouth every 8 (eight) hours.      Marland Kitchen LOSARTAN POTASSIUM-HCTZ 50-12.5 MG PO TABS Oral Take 1 tablet by mouth daily.      Marland Kitchen MECLIZINE HCL 25 MG PO TABS Oral Take 25 mg by mouth 3 (three) times daily as needed. For dizziness     . NAPROXEN 500 MG PO TABS Oral Take 500 mg by mouth 2 (two) times daily with a meal.      . OMEPRAZOLE 40 MG PO CPDR Oral Take 40 mg by mouth daily.      Marland Kitchen PAROXETINE HCL 20 MG PO TABS Oral Take 20 mg by mouth every morning.        BP 115/78  Pulse 63  Temp(Src) 98.4 F (36.9 C) (Oral)  Resp 18  Ht 5\' 3"  (1.6 m)  Wt 140 lb (63.504 kg)  BMI 24.80 kg/m2  SpO2 98%  Physical Exam  Nursing note and vitals reviewed. Constitutional: She is oriented to person, place, and time. She appears well-developed and well-nourished.  HENT:  Head: Normocephalic.  Eyes:  Blind left eye.  Neck: Neck supple.  Pulmonary/Chest: Effort normal.  Musculoskeletal:       Feet:       Tenderness with palpation over right heel. Pedal pulse 2+ and adequate circulation.  Neurological: She is alert and oriented to person, place, and time. No cranial nerve deficit.  Skin: Skin is warm and dry.  Psychiatric: She has a normal mood and affect. Her behavior is normal. Judgment and thought content normal.    ED Course  Procedures Dg Foot Complete Right  02/23/2011  *RADIOLOGY REPORT*  Clinical Data: 50 year old female with right heel and foot pain.  RIGHT FOOT COMPLETE - 3+ VIEW  Comparison: None  Findings: There is no evidence of acute fracture, subluxation, or dislocation. The Lisfranc joints are intact. No lytic/sclerotic l bony lesions are identified. There is no evidence of radiopaque foreign body.  The joint  spaces are unremarkable.  A tiny calcaneal spur is present.  IMPRESSION: Tiny calcaneal spur, otherwise unremarkable exam.  Original Report Authenticated By: Rosendo Gros, M.D.   MDM          Kerrie Buffalo, NP 02/23/11 [phone]

## 2011-05-20 ENCOUNTER — Encounter (HOSPITAL_COMMUNITY): Payer: Self-pay | Admitting: Family Medicine

## 2011-05-20 ENCOUNTER — Emergency Department (HOSPITAL_COMMUNITY)
Admission: EM | Admit: 2011-05-20 | Discharge: 2011-05-20 | Disposition: A | Discharge status: Home / Self Care | Payer: Medicare Other | Attending: Emergency Medicine | Admitting: Emergency Medicine

## 2011-05-20 DIAGNOSIS — M810 Age-related osteoporosis without current pathological fracture: Secondary | ICD-10-CM | POA: Insufficient documentation

## 2011-05-20 DIAGNOSIS — M1810 Unilateral primary osteoarthritis of first carpometacarpal joint, unspecified hand: Secondary | ICD-10-CM

## 2011-05-20 DIAGNOSIS — F411 Generalized anxiety disorder: Secondary | ICD-10-CM | POA: Insufficient documentation

## 2011-05-20 DIAGNOSIS — F17200 Nicotine dependence, unspecified, uncomplicated: Secondary | ICD-10-CM | POA: Insufficient documentation

## 2011-05-20 DIAGNOSIS — Z9889 Other specified postprocedural states: Secondary | ICD-10-CM | POA: Insufficient documentation

## 2011-05-20 DIAGNOSIS — M19049 Primary osteoarthritis, unspecified hand: Secondary | ICD-10-CM | POA: Insufficient documentation

## 2011-05-20 DIAGNOSIS — I10 Essential (primary) hypertension: Secondary | ICD-10-CM | POA: Insufficient documentation

## 2011-05-20 NOTE — Discharge Instructions (Signed)
Please wear the thumb splint for two weeks.  You should also ice your thumb for 20 minutes twice a day.  Please follow up with Dr. Janna Arch in a few weeks if this is not improving.   Arthritis, Nonspecific Arthritis is pain, redness, warmth, or puffiness (swelling) of a joint. The joint may be stiff or hurt when you move it. One or more joints may be affected. There are many types of arthritis. Your doctor may not know what type you have right away. The most common cause of arthritis is wear and tear on the joint (osteoarthritis). HOME CARE   Only take medicine as told by your doctor.   Rest the joint as much as possible.   Raise (elevate) your joint if it is puffy.   Use crutches if the painful joint is in your leg.   Drink enough water and fluids to keep your pee (urine) clear or pale yellow.   Follow your doctor's instructions for diet.   Use cold packs for very bad joint pain for 10 to 15 minutes every hour. Ask your doctor if it is okay for you to use hot packs.   Exercise as told by your doctor.   Take a warm shower if you have stiffness in the morning.   Move your sore joints throughout the day.  GET HELP RIGHT AWAY IF:   You do not feel better in 24 hours or are getting worse.   You are having side effects from your medicine.   You are not getting better with treatment.   You have a fever.   You have very bad joint pain, puffiness, or redness.   Many joints become painful and puffy.   You have very bad back pain or leg weakness.   You cannot control when you poop (bowel movement) or pee (urinate).  MAKE SURE YOU:   Understand these instructions.   Will watch your condition.   Will get help right away if you are not doing well or get worse.  Document Released: 06/12/2009 Document Revised: 11/28/2010 Document Reviewed: 06/12/2009 [facility] Patient Information 2012 [place], [place].

## 2011-05-20 NOTE — ED Provider Notes (Signed)
History     CSN: [id number]  Arrival date & time 05/20/11  4098   First MD Initiated Contact with Patient 05/20/11 0930      Chief Complaint: Right thumb pain.  HPI Comments: Pt complaining of right thumb pain that has been gradually worsening.   Patient is a 51 y.o. female presenting with hand pain. The history is provided by the patient.  Hand Pain The current episode started more than 1 month ago. The problem occurs constantly. The problem has been gradually worsening. Associated symptoms include arthralgias and joint swelling. Pertinent negatives include no abdominal pain, chest pain, congestion, fever or rash. Exacerbated by: Using the thumb.  She has tried NSAIDs, oral narcotics, ice and heat for the symptoms. The treatment provided no relief.   She says she felt like the bone was swollen, and she knows she has osteoporosis, so she wanted to make sure it was not broken.  She has not had a fall or any trauma to that hand or thumb.   Past Medical History  Diagnosis Date  . Hypertension   . Anxiety   . Osteoporosis     Past Surgical History  Procedure Date  . Back surgery   . Cervical spine surgery   . Cholecystectomy     No family history on file.  History  Substance Use Topics  . Smoking status: Current Everyday Smoker  . Smokeless tobacco: Not on file  . Alcohol Use: No   Review of Systems  Constitutional: Negative for fever.  HENT: Negative for congestion.   Eyes: Negative for visual disturbance.  Respiratory: Negative for shortness of breath.   Cardiovascular: Negative for chest pain.  Gastrointestinal: Negative for abdominal pain.  Genitourinary: Negative for difficulty urinating.  Musculoskeletal: Positive for joint swelling and arthralgias.  Skin: Negative for rash.  Neurological: Negative for dizziness and syncope.    Allergies  Bee venom  Home Medications   Current Outpatient Rx  Name Route Sig Dispense Refill  . ALENDRONATE SODIUM 70 MG PO TABS  Oral Take 70 mg by mouth every 7 (seven) days. Take with a full glass of water on an empty stomach.    Marland Kitchen CALCIUM 600 + D PO Oral Take 1 tablet by mouth daily.      Marland Kitchen HYDROCODONE-ACETAMINOPHEN 7.5-500 MG PO TABS Oral Take 1 tablet by mouth every 8 (eight) hours.      Marland Kitchen LOSARTAN POTASSIUM-HCTZ 50-12.5 MG PO TABS Oral Take 1 tablet by mouth daily.      Marland Kitchen MECLIZINE HCL 25 MG PO TABS Oral Take 25 mg by mouth 3 (three) times daily as needed. For dizziness     . NAPROXEN 500 MG PO TABS Oral Take 500 mg by mouth 2 (two) times daily with a meal.      . OMEPRAZOLE 40 MG PO CPDR Oral Take 40 mg by mouth daily.      Marland Kitchen PAROXETINE HCL 20 MG PO TABS Oral Take 20 mg by mouth every morning.        There were no vitals taken for this visit.  Physical Exam  Constitutional: She appears well-developed and well-nourished. No distress.  HENT:  Head: Normocephalic and atraumatic.  Eyes: EOM are normal. Pupils are equal, round, and reactive to light.  Musculoskeletal:       Left thumb tenderness to palpation at base.  No joint effusion.  Pt has normal strength and sensation in right thumb and rest of right and left hand, but she does have  pain with strength testing.      ED Course  Procedures (including critical care time)  Labs Reviewed - No data to display No results found.   1. Degenerative arthritis of thumb       MDM  Pt placed in splint, advised to RICE, and follow up with PCP.         Ardyth Gal, MD 05/21/11 [phone]

## 2011-05-20 NOTE — ED Notes (Signed)
Pt c/o pain to left thumb for "a while."  Denies injury, pt says pain is gradually getting worse.  No obvious swelling or deformity noted.

## 2011-05-20 NOTE — ED Provider Notes (Signed)
I saw and evaluated the patient, reviewed the resident's note and I agree with the findings and plan. 51 year old, female complains of pain in her left thumb.  She denies trauma.  She has used Naprosyn and Lortab without any relief.  She has no other complaints.  On examination.  She has tenderness over the metacarpal phalangeal joint of her left thumb.  There is no overlying erythema or rash.  She has full strength and normal capillary refill.  Tightness.  This is arthritis.  We will give her a splint and have her follow up with her Dr. For reevaluation as needed.  Nicholes Stairs, MD 05/20/11 1024

## 2011-05-22 NOTE — ED Provider Notes (Signed)
I saw and evaluated the patient, reviewed the resident's note and I agree with the findings and plan.  Nicholes Stairs, MD 05/22/11 [phone]

## 2011-08-29 ENCOUNTER — Other Ambulatory Visit (HOSPITAL_COMMUNITY): Payer: Self-pay | Admitting: Family Medicine

## 2011-08-29 ENCOUNTER — Ambulatory Visit (HOSPITAL_COMMUNITY)
Admission: RE | Admit: 2011-08-29 | Discharge: 2011-08-29 | Disposition: A | Discharge status: Home / Self Care | Payer: Medicare Other | Source: Ambulatory Visit | Attending: Family Medicine | Admitting: Family Medicine

## 2011-08-29 DIAGNOSIS — R52 Pain, unspecified: Secondary | ICD-10-CM

## 2011-08-29 DIAGNOSIS — M898X9 Other specified disorders of bone, unspecified site: Secondary | ICD-10-CM | POA: Insufficient documentation

## 2011-08-29 DIAGNOSIS — M25579 Pain in unspecified ankle and joints of unspecified foot: Secondary | ICD-10-CM | POA: Insufficient documentation

## 2012-02-13 ENCOUNTER — Other Ambulatory Visit (HOSPITAL_COMMUNITY): Payer: Self-pay | Admitting: Family Medicine

## 2012-02-13 DIAGNOSIS — M79605 Pain in left leg: Secondary | ICD-10-CM

## 2012-02-13 DIAGNOSIS — M25552 Pain in left hip: Secondary | ICD-10-CM

## 2012-02-17 ENCOUNTER — Ambulatory Visit (HOSPITAL_COMMUNITY)
Admission: RE | Admit: 2012-02-17 | Discharge: 2012-02-17 | Disposition: A | Discharge status: Home / Self Care | Payer: Medicare Other | Source: Ambulatory Visit | Attending: Family Medicine | Admitting: Family Medicine

## 2012-02-17 DIAGNOSIS — M25552 Pain in left hip: Secondary | ICD-10-CM

## 2012-02-17 DIAGNOSIS — M4804 Spinal stenosis, thoracic region: Secondary | ICD-10-CM | POA: Insufficient documentation

## 2012-02-17 DIAGNOSIS — M79605 Pain in left leg: Secondary | ICD-10-CM

## 2012-03-01 ENCOUNTER — Encounter (HOSPITAL_COMMUNITY): Payer: Self-pay | Admitting: Emergency Medicine

## 2012-03-01 ENCOUNTER — Emergency Department (HOSPITAL_COMMUNITY)
Admission: EM | Admit: 2012-03-01 | Discharge: 2012-03-01 | Disposition: A | Discharge status: Home / Self Care | Payer: Medicare Other | Attending: Emergency Medicine | Admitting: Emergency Medicine

## 2012-03-01 DIAGNOSIS — F411 Generalized anxiety disorder: Secondary | ICD-10-CM | POA: Insufficient documentation

## 2012-03-01 DIAGNOSIS — M810 Age-related osteoporosis without current pathological fracture: Secondary | ICD-10-CM | POA: Insufficient documentation

## 2012-03-01 DIAGNOSIS — Z87891 Personal history of nicotine dependence: Secondary | ICD-10-CM | POA: Insufficient documentation

## 2012-03-01 DIAGNOSIS — Z79899 Other long term (current) drug therapy: Secondary | ICD-10-CM | POA: Insufficient documentation

## 2012-03-01 DIAGNOSIS — L02419 Cutaneous abscess of limb, unspecified: Secondary | ICD-10-CM | POA: Insufficient documentation

## 2012-03-01 DIAGNOSIS — I10 Essential (primary) hypertension: Secondary | ICD-10-CM | POA: Insufficient documentation

## 2012-03-01 DIAGNOSIS — L03119 Cellulitis of unspecified part of limb: Secondary | ICD-10-CM | POA: Insufficient documentation

## 2012-03-01 DIAGNOSIS — L03116 Cellulitis of left lower limb: Secondary | ICD-10-CM

## 2012-03-01 LAB — CBC WITH DIFFERENTIAL/PLATELET
Basophils Relative: 0 % (ref 0–1)
Eosinophils Absolute: 0.1 10*3/uL (ref 0.0–0.7)
Eosinophils Relative: 1 % (ref 0–5)
HCT: 32.4 % — ABNORMAL LOW (ref 36.0–46.0)
Hemoglobin: 11 g/dL — ABNORMAL LOW (ref 12.0–15.0)
MCH: 31.3 pg (ref 26.0–34.0)
MCHC: 34 g/dL (ref 30.0–36.0)
Monocytes Absolute: 1 10*3/uL (ref 0.1–1.0)
Monocytes Relative: 9 % (ref 3–12)

## 2012-03-01 LAB — BASIC METABOLIC PANEL
BUN: 12 mg/dL (ref 6–23)
Calcium: 9.4 mg/dL (ref 8.4–10.5)
GFR calc non Af Amer: >90 mL/min (ref >90)
Glucose, Bld: 116 mg/dL — ABNORMAL HIGH (ref 70–99)

## 2012-03-01 MED ORDER — OXYCODONE-ACETAMINOPHEN 5-325 MG PO TABS: 1.0000 | ORAL_TABLET | ORAL | Status: AC | PRN

## 2012-03-01 MED ORDER — VANCOMYCIN HCL IN DEXTROSE 1-5 GM/200ML-% IV SOLN
1000.0000 mg | Freq: Once | INTRAVENOUS | Status: AC
  Administered 2012-03-01: 1000 mg via INTRAVENOUS
  Filled 2012-03-01: qty 200

## 2012-03-01 MED ORDER — SULFAMETHOXAZOLE-TRIMETHOPRIM 800-160 MG PO TABS: 1.0000 | ORAL_TABLET | Freq: Two times a day (BID) | ORAL | Status: DC

## 2012-03-01 MED ORDER — SODIUM CHLORIDE 0.9 % IV SOLN
Freq: Once | INTRAVENOUS | Status: AC
  Administered 2012-03-01: 20 mL/h via INTRAVENOUS

## 2012-03-01 NOTE — ED Notes (Signed)
Dr Preston Fleeting at bedside, doppler study preformed at this time.

## 2012-03-01 NOTE — ED Notes (Signed)
Pt c/o left leg pain after scratching a bump yesterday. Pt has redness, swelling and warm to the touch to the left lower leg.

## 2012-03-01 NOTE — ED Notes (Signed)
Pt presents with redness, edema and open sores x 4 on left lower anterior leg. Pt reports being treated in August for " a spider bite" by PCP. Has had two rounds of antibiotics without success. Swelling and redness began yesterday and has worsened throughout the night per pt.  Pt denies fever, N/V today. Pt is alert, oriented at this time.

## 2012-03-01 NOTE — ED Provider Notes (Signed)
History     CSN: [id number]  Arrival date & time 03/01/12  1039   First MD Initiated Contact with Patient 03/01/12 1226      Chief Complaint  Patient presents with  . Cellulitis    (Consider location/radiation/quality/duration/timing/severity/associated sxs/prior treatment) The history is provided by the patient.   51 year old female noted a bump on her left leg had 2 days ago. She is resting state to white core came out of it. Since then, it has become painful and red and swollen. Pain is severe and she rates it 10/10. She states that the leg is warm but she has not actually run a body fever. There've been no chills or sweats. Just proximal to this, there are 2 areas which she was told were spider bites which were treated 3 months ago with antibiotics and continuing to drain. Drainage from these sites has been green. Pain is worse with palpation with ambulating, and nothing makes it better. She had initially been treated with antibiotics per the proximal lesions which were diagnosed as spider bites. She's not been any specific treatment for the current lesion.  Past Medical History  Diagnosis Date  . Hypertension   . Anxiety   . Osteoporosis     Past Surgical History  Procedure Date  . Back surgery   . Cervical spine surgery   . Cholecystectomy     History reviewed. No pertinent family history.  History  Substance Use Topics  . Smoking status: Former Games developer  . Smokeless tobacco: Not on file  . Alcohol Use: No    OB History    Grav Para Term Preterm Abortions TAB SAB Ect Mult Living                  Review of Systems  All other systems reviewed and are negative.    Allergies  Bee venom  Home Medications   Current Outpatient Rx  Name  Route  Sig  Dispense  Refill  . ALENDRONATE SODIUM 70 MG PO TABS   Oral   Take 70 mg by mouth every 7 (seven) days. Take with a full glass of water on an empty stomach.         Marland Kitchen CALCIUM 600 + D PO   Oral   Take 1 tablet  by mouth daily.           Marland Kitchen HYDROCODONE-ACETAMINOPHEN 7.5-500 MG PO TABS   Oral   Take 1 tablet by mouth every 8 (eight) hours.           Marland Kitchen LOSARTAN POTASSIUM-HCTZ 50-12.5 MG PO TABS   Oral   Take 1 tablet by mouth daily.           Marland Kitchen MECLIZINE HCL 25 MG PO TABS   Oral   Take 25 mg by mouth 3 (three) times daily as needed. For dizziness          . NAPROXEN 500 MG PO TABS   Oral   Take 500 mg by mouth 2 (two) times daily with a meal.           . OMEPRAZOLE 40 MG PO CPDR   Oral   Take 40 mg by mouth daily.           Marland Kitchen PAROXETINE HCL 20 MG PO TABS   Oral   Take 20 mg by mouth every morning.             BP 113/88  Pulse 98  Temp 98.1 F (  36.7 C) (Oral)  Resp 18  Ht 5\' 3"  (1.6 m)  Wt 152 lb (68.947 kg)  BMI 26.93 kg/m2  SpO2 99%  Physical Exam  Nursing note and vitals reviewed. 51 year old female, resting comfortably and in no acute distress. Vital signs are normal. Oxygen saturation is 99%, which is normal. Head is normocephalic and atraumatic. PERRLA, strabismus causes left sided deviated medially. Oropharynx is clear. Neck is nontender and supple without adenopathy or JVD. Back is nontender and there is no CVA tenderness. Lungs are clear without rales, wheezes, or rhonchi. Chest is nontender. Heart has regular rate and rhythm without murmur. Abdomen is soft, flat, nontender without masses or hepatosplenomegaly and peristalsis is normoactive. Extremities have no cyanosis or edema, full range of motion is present. Left leg has an area of induration in the anteromedial aspect approximately 10 cm distal to the knee. This indurated area is about 2.5 cm in diameter. There is a break in the skin in the central area but there is no drainage. This is quite tender to palpation. This is surrounded by a zone of erythema which is warm to touch but does not nontender - 14x16 cm. There's no area of fluctuance. No lymphangitic streaks are seen. Proximal to this, R2 sinus  tracts with mild erythema and no drainage. Skin is warm and dry without other rash. Neurologic: Mental status is normal, cranial nerves are intact, there are no motor or sensory deficits.   ED Course  Procedures (including critical care time)  Results for orders placed during the hospital encounter of 03/01/12  CBC WITH DIFFERENTIAL      Component Value Range   WBC 11.6 (*) 4.0 - 10.5 K/uL   RBC 3.52 (*) 3.87 - 5.11 MIL/uL   Hemoglobin 11.0 (*) 12.0 - 15.0 g/dL   HCT 40.9 (*) 81.1 - 91.4 %   MCV 92.0  78.0 - 100.0 fL   MCH 31.3  26.0 - 34.0 pg   MCHC 34.0  30.0 - 36.0 g/dL   RDW 78.2  95.6 - 21.3 %   Platelets 306  150 - 400 K/uL   Neutrophils Relative 79 (*) 43 - 77 %   Neutro Abs 9.2 (*) 1.7 - 7.7 K/uL   Lymphocytes Relative 11 (*) 12 - 46 %   Lymphs Abs 1.2  0.7 - 4.0 K/uL   Monocytes Relative 9  3 - 12 %   Monocytes Absolute 1.0  0.1 - 1.0 K/uL   Eosinophils Relative 1  0 - 5 %   Eosinophils Absolute 0.1  0.0 - 0.7 K/uL   Basophils Relative 0  0 - 1 %   Basophils Absolute 0.0  0.0 - 0.1 K/uL  BASIC METABOLIC PANEL      Component Value Range   Sodium 137  135 - 145 mEq/L   Potassium 3.0 (*) 3.5 - 5.1 mEq/L   Chloride 101  96 - 112 mEq/L   CO2 24  19 - 32 mEq/L   Glucose, Bld 116 (*) 70 - 99 mg/dL   BUN 12  6 - 23 mg/dL   Creatinine, Ser 0.86  0.50 - 1.10 mg/dL   Calcium 9.4  8.4 - 57.8 mg/dL   GFR calc non Af Amer >90  >90 mL/min   GFR calc Af Amer >90  >90 mL/min    1. Cellulitis of left leg       MDM  Possible abscess with surrounding cellulitis. Limited bedside ultrasound was done and did not show  any collection of pus in the subcutaneous tissues. Images were saved. Therefore, she will be treated as cellulitis and will be given initial dose of vancomycin in the emergency department. Laboratory workup has been initiated.  WBC is only mildly elevated with minimal left shift. I believe she can safely be started on treatment at home. She is discharged with  prescription for Bactrim DS and is instructed to be rechecked in 2 days-either at her PCPs office, or in the ED. She's also given prescription for Percocet for pain control.    Dione Booze, MD 03/01/12 [phone]

## 2012-04-24 ENCOUNTER — Emergency Department (HOSPITAL_COMMUNITY): Payer: Medicare Other

## 2012-04-24 ENCOUNTER — Emergency Department (HOSPITAL_COMMUNITY)
Admission: EM | Admit: 2012-04-24 | Discharge: 2012-04-24 | Disposition: A | Discharge status: Home / Self Care | Payer: Medicare Other | Attending: Emergency Medicine | Admitting: Emergency Medicine

## 2012-04-24 ENCOUNTER — Encounter (HOSPITAL_COMMUNITY): Payer: Self-pay | Admitting: *Deleted

## 2012-04-24 DIAGNOSIS — Z79899 Other long term (current) drug therapy: Secondary | ICD-10-CM | POA: Insufficient documentation

## 2012-04-24 DIAGNOSIS — S40019A Contusion of unspecified shoulder, initial encounter: Secondary | ICD-10-CM

## 2012-04-24 DIAGNOSIS — F411 Generalized anxiety disorder: Secondary | ICD-10-CM | POA: Insufficient documentation

## 2012-04-24 DIAGNOSIS — S335XXA Sprain of ligaments of lumbar spine, initial encounter: Secondary | ICD-10-CM | POA: Insufficient documentation

## 2012-04-24 DIAGNOSIS — Y92009 Unspecified place in unspecified non-institutional (private) residence as the place of occurrence of the external cause: Secondary | ICD-10-CM | POA: Insufficient documentation

## 2012-04-24 DIAGNOSIS — Z7982 Long term (current) use of aspirin: Secondary | ICD-10-CM | POA: Insufficient documentation

## 2012-04-24 DIAGNOSIS — S39012A Strain of muscle, fascia and tendon of lower back, initial encounter: Secondary | ICD-10-CM

## 2012-04-24 DIAGNOSIS — Y9389 Activity, other specified: Secondary | ICD-10-CM | POA: Insufficient documentation

## 2012-04-24 DIAGNOSIS — Z87891 Personal history of nicotine dependence: Secondary | ICD-10-CM | POA: Insufficient documentation

## 2012-04-24 DIAGNOSIS — I10 Essential (primary) hypertension: Secondary | ICD-10-CM | POA: Insufficient documentation

## 2012-04-24 DIAGNOSIS — W108XXA Fall (on) (from) other stairs and steps, initial encounter: Secondary | ICD-10-CM | POA: Insufficient documentation

## 2012-04-24 DIAGNOSIS — M810 Age-related osteoporosis without current pathological fracture: Secondary | ICD-10-CM | POA: Insufficient documentation

## 2012-04-24 MED ORDER — HYDROCODONE-ACETAMINOPHEN 5-325 MG PO TABS: 1.0000 | ORAL_TABLET | ORAL | Status: DC | PRN

## 2012-04-24 NOTE — ED Notes (Signed)
Bruising noted to left posterior shoulder

## 2012-04-24 NOTE — ED Provider Notes (Signed)
History     CSN: [id number]  Arrival date & time 04/24/12  1734   None     Chief Complaint  Patient presents with  . Fall   HPI Alyssa Poole is a 52 y.o. female who presents to the ED with pain in left shoulder and lower back. The onset was sudden. The patient was going down a step in her home and tripped and fell. She landed on her left shoulder. She also hit her lower back. There was no LOC. The history was provided by the patient.  Past Medical History  Diagnosis Date  . Hypertension   . Anxiety   . Osteoporosis     Past Surgical History  Procedure Date  . Back surgery   . Cervical spine surgery   . Cholecystectomy     History reviewed. No pertinent family history.  History  Substance Use Topics  . Smoking status: Former Games developer  . Smokeless tobacco: Not on file  . Alcohol Use: No    OB History    Grav Para Term Preterm Abortions TAB SAB Ect Mult Living                  Review of Systems  Constitutional: Negative for fever, chills, diaphoresis and fatigue.  HENT: Negative for ear pain, congestion, sore throat, facial swelling, neck pain, neck stiffness, dental problem and sinus pressure.   Eyes: Negative for photophobia, pain and discharge.  Respiratory: Negative for cough, chest tightness, shortness of breath and wheezing.   Cardiovascular: Negative for chest pain and palpitations.  Gastrointestinal: Negative for nausea, vomiting, abdominal pain, diarrhea, constipation and abdominal distention.  Genitourinary: Negative for dysuria, urgency, frequency, flank pain and difficulty urinating.  Musculoskeletal: Positive for back pain. Negative for myalgias and gait problem.  Skin: Negative for color change and rash.  Neurological: Negative for dizziness, speech difficulty, weakness, light-headedness, numbness and headaches.  Psychiatric/Behavioral: Negative for confusion and agitation. The patient is not nervous/anxious.     Allergies  Bee venom  Home  Medications   Current Outpatient Rx  Name  Route  Sig  Dispense  Refill  . ALENDRONATE SODIUM 70 MG PO TABS   Oral   Take 70 mg by mouth every 7 (seven) days. Takes on Sunday. Take with a full glass of water on an empty stomach.         . ASPIRIN EC 81 MG PO TBEC   Oral   Take 81 mg by mouth daily as needed.         Marland Kitchen CALCIUM 600 + D PO   Oral   Take 1 tablet by mouth daily.           Marland Kitchen HYDROCODONE-ACETAMINOPHEN 5-325 MG PO TABS   Oral   Take 1-2 tablets by mouth every 6 (six) hours as needed. For pain         . LOSARTAN POTASSIUM-HCTZ 50-12.5 MG PO TABS   Oral   Take 1 tablet by mouth daily.           Marland Kitchen MECLIZINE HCL 25 MG PO TABS   Oral   Take 25 mg by mouth 3 (three) times daily as needed. For dizziness         . PANTOPRAZOLE SODIUM 40 MG PO TBEC   Oral   Take 1 tablet by mouth Daily.         Marland Kitchen PAROXETINE HCL 20 MG PO TABS   Oral   Take 20 mg by mouth  daily.            BP 105/66  Pulse 66  Temp 98.2 F (36.8 C) (Oral)  Resp 20  Ht 5\' 3"  (1.6 m)  Wt 152 lb (68.947 kg)  BMI 26.93 kg/m2  SpO2 99%  Physical Exam  Nursing note and vitals reviewed. Constitutional: She is oriented to person, place, and time. She appears well-developed and well-nourished.  HENT:  Head: Normocephalic and atraumatic.  Eyes: EOM are normal. Pupils are equal, round, and reactive to light.  Neck: Neck supple.  Cardiovascular: Normal rate and regular rhythm.   Pulmonary/Chest: Effort normal. No respiratory distress. She has no wheezes.  Abdominal: Soft. There is no tenderness.  Musculoskeletal: Normal range of motion.       Arms:      Abrasion, tenderness and ecchymosis to left shoulder.  Tender with palpation and range of motion right lower lumbar area. Ambulatory without difficulty.  Neurological: She is alert and oriented to person, place, and time. She has normal strength and normal reflexes. No cranial nerve deficit or sensory deficit. Coordination normal.    Skin: Skin is warm and dry.  Psychiatric: She has a normal mood and affect. Her behavior is normal. Judgment and thought content normal.   Procedures   Labs Reviewed - No data to display Dg Lumbar Spine Complete  04/24/2012  *RADIOLOGY REPORT*  Clinical Data: Pain post fall. 02/17/2012 MR.  LUMBAR SPINE - COMPLETE 4+ VIEW  Comparison: None.  Findings: Degenerative changes most notable L5-S1 and T12-L1 level. No acute fracture noted.  If symptoms persist MR may prove helpful.  IMPRESSION: No acute fracture.  Please see above.   Original Report Authenticated By: Lacy Duverney, M.D.    Dg Shoulder Left  04/24/2012  *RADIOLOGY REPORT*  Clinical Data: Left shoulder pain post fall.  LEFT SHOULDER - 2+ VIEW  Comparison: None.  Findings: No fracture or dislocation.  Mild acromioclavicular joint degenerative changes.  Visualized lungs clear.  IMPRESSION: No fracture or dislocation.   Original Report Authenticated By: Lacy Duverney, M.D.    Assessment: 52 y.o. female s/p fall with left shoulder pain and lower back pain   Contusion and abrasion left shoulder   Low back strain  Plan:  Pain management   Ice, rest, follow up with PCP  I have reviewed this patient's vital signs, nurses notes, appropriate labs and imaging.  I have discussed results with the patient and plan of care. Patient voices understanding.    Medication List     As of 04/24/2012  7:45 PM    START taking these medications         * HYDROcodone-acetaminophen 5-325 MG per tablet   Commonly known as: NORCO/VICODIN   Take 1 tablet by mouth every 4 (four) hours as needed for pain.     * Notice: This list has 1 medication(s) that are the same as other medications prescribed for you. Read the directions carefully, and ask your doctor or other care provider to review them with you.    ASK your doctor about these medications         alendronate 70 MG tablet   Commonly known as: FOSAMAX      aspirin EC 81 MG tablet      CALCIUM 600  + D PO      * HYDROcodone-acetaminophen 5-325 MG per tablet   Commonly known as: NORCO/VICODIN      losartan-hydrochlorothiazide 50-12.5 MG per tablet   Commonly known as: HYZAAR  meclizine 25 MG tablet   Commonly known as: ANTIVERT      pantoprazole 40 MG tablet   Commonly known as: PROTONIX      PARoxetine 20 MG tablet   Commonly known as: PAXIL     * Notice: This list has 1 medication(s) that are the same as other medications prescribed for you. Read the directions carefully, and ask your doctor or other care provider to review them with you.        Where to get your medications    These are the prescriptions that you need to pick up.   You may get these medications from any pharmacy.         HYDROcodone-acetaminophen 5-325 MG per tablet              Janne Napoleon, NP 04/24/12 1945

## 2012-04-24 NOTE — ED Notes (Signed)
Fall yesterday",stepped down wrong",  Pain in low back since fall

## 2012-04-25 NOTE — ED Provider Notes (Signed)
Alyssa Poole is a 52 y.o. female  Flint Melter, MD 04/25/12 [phone]

## 2012-05-18 ENCOUNTER — Encounter (HOSPITAL_COMMUNITY): Payer: Self-pay | Admitting: *Deleted

## 2012-05-18 ENCOUNTER — Emergency Department (HOSPITAL_COMMUNITY)
Admission: EM | Admit: 2012-05-18 | Discharge: 2012-05-18 | Disposition: A | Discharge status: Home / Self Care | Payer: Medicare Other | Attending: Emergency Medicine | Admitting: Emergency Medicine

## 2012-05-18 ENCOUNTER — Emergency Department (HOSPITAL_COMMUNITY): Payer: Medicare Other

## 2012-05-18 DIAGNOSIS — J3489 Other specified disorders of nose and nasal sinuses: Secondary | ICD-10-CM | POA: Insufficient documentation

## 2012-05-18 DIAGNOSIS — R6889 Other general symptoms and signs: Secondary | ICD-10-CM | POA: Insufficient documentation

## 2012-05-18 DIAGNOSIS — M810 Age-related osteoporosis without current pathological fracture: Secondary | ICD-10-CM | POA: Insufficient documentation

## 2012-05-18 DIAGNOSIS — IMO0001 Reserved for inherently not codable concepts without codable children: Secondary | ICD-10-CM | POA: Insufficient documentation

## 2012-05-18 DIAGNOSIS — Z79899 Other long term (current) drug therapy: Secondary | ICD-10-CM | POA: Insufficient documentation

## 2012-05-18 DIAGNOSIS — R062 Wheezing: Secondary | ICD-10-CM | POA: Insufficient documentation

## 2012-05-18 DIAGNOSIS — F411 Generalized anxiety disorder: Secondary | ICD-10-CM | POA: Insufficient documentation

## 2012-05-18 DIAGNOSIS — I10 Essential (primary) hypertension: Secondary | ICD-10-CM | POA: Insufficient documentation

## 2012-05-18 DIAGNOSIS — Z87891 Personal history of nicotine dependence: Secondary | ICD-10-CM | POA: Insufficient documentation

## 2012-05-18 DIAGNOSIS — J111 Influenza due to unidentified influenza virus with other respiratory manifestations: Secondary | ICD-10-CM | POA: Insufficient documentation

## 2012-05-18 DIAGNOSIS — H9209 Otalgia, unspecified ear: Secondary | ICD-10-CM | POA: Insufficient documentation

## 2012-05-18 DIAGNOSIS — J029 Acute pharyngitis, unspecified: Secondary | ICD-10-CM | POA: Insufficient documentation

## 2012-05-18 DIAGNOSIS — R0602 Shortness of breath: Secondary | ICD-10-CM | POA: Insufficient documentation

## 2012-05-18 DIAGNOSIS — R51 Headache: Secondary | ICD-10-CM | POA: Insufficient documentation

## 2012-05-18 MED ORDER — ALBUTEROL SULFATE HFA 108 (90 BASE) MCG/ACT IN AERS: 1.0000 | INHALATION_SPRAY | Freq: Four times a day (QID) | RESPIRATORY_TRACT | Status: DC | PRN

## 2012-05-18 MED ORDER — GUAIFENESIN-CODEINE 100-10 MG/5ML PO SYRP: 5.0000 mL | ORAL_SOLUTION | Freq: Three times a day (TID) | ORAL | Status: DC | PRN

## 2012-05-18 NOTE — ED Provider Notes (Signed)
History     CSN: [id number]  Arrival date & time 05/18/12  4098   First MD Initiated Contact with Patient 05/18/12 1004      Chief Complaint  Patient presents with  . Influenza   HPI Alyssa Poole is a 52 y.o. female who presents to the ED with flu like symptoms. The symptoms started 4 days ago. The symptoms include cough, aching, congestion and right ear pain. The history was provided by the patient.  Past Medical History  Diagnosis Date  . Hypertension   . Anxiety   . Osteoporosis     Past Surgical History  Procedure Laterality Date  . Back surgery    . Cervical spine surgery    . Cholecystectomy      No family history on file.  History  Substance Use Topics  . Smoking status: Former Games developer  . Smokeless tobacco: Not on file  . Alcohol Use: No    OB History   Grav Para Term Preterm Abortions TAB SAB Ect Mult Living                  Review of Systems  Constitutional: Negative for fever, chills, diaphoresis and fatigue.  HENT: Positive for ear pain, congestion, sore throat, sneezing and sinus pressure. Negative for facial swelling, neck pain, neck stiffness and dental problem.   Eyes: Negative for photophobia, pain and discharge.  Respiratory: Positive for cough, shortness of breath and wheezing. Negative for chest tightness.   Cardiovascular: Negative for chest pain and palpitations.  Gastrointestinal: Negative for nausea, vomiting, abdominal pain, diarrhea, constipation and abdominal distention.  Genitourinary: Negative for dysuria, urgency, frequency, flank pain and difficulty urinating.  Musculoskeletal: Positive for myalgias. Negative for back pain and gait problem.  Skin: Negative for color change and rash.  Neurological: Positive for headaches. Negative for dizziness, speech difficulty, weakness, light-headedness and numbness.  Psychiatric/Behavioral: Negative for confusion and agitation. The patient is not nervous/anxious.     Allergies  Bee  venom  Home Medications   Current Outpatient Rx  Name  Route  Sig  Dispense  Refill  . alendronate (FOSAMAX) 70 MG tablet   Oral   Take 70 mg by mouth every 7 (seven) days. Takes on Sunday. Take with a full glass of water on an empty stomach.         Marland Kitchen aspirin EC 81 MG tablet   Oral   Take 81 mg by mouth daily as needed.         . Calcium Carbonate-Vitamin D (CALCIUM 600 + D PO)   Oral   Take 1 tablet by mouth daily.           Marland Kitchen losartan-hydrochlorothiazide (HYZAAR) 50-12.5 MG per tablet   Oral   Take 1 tablet by mouth daily.           Marland Kitchen oxyCODONE (OXY IR/ROXICODONE) 5 MG immediate release tablet   Oral   Take 5 mg by mouth 3 (three) times daily.         . pantoprazole (PROTONIX) 40 MG tablet   Oral   Take 1 tablet by mouth Daily.         Marland Kitchen PARoxetine (PAXIL) 20 MG tablet   Oral   Take 20 mg by mouth daily.            BP 123/80  Pulse 77  Temp(Src) 98.2 F (36.8 C) (Oral)  Resp 16  Ht 5\' 3"  (1.6 m)  Wt 152 lb (68.947 kg)  BMI 26.93 kg/m2  SpO2 97%  Physical Exam  Nursing note and vitals reviewed. Constitutional: She is oriented to person, place, and time. She appears well-developed and well-nourished.  HENT:  Head: Normocephalic and atraumatic.  Right Ear: Tympanic membrane normal.  Left Ear: Tympanic membrane normal.  Nose: Rhinorrhea present.  Mouth/Throat: Uvula is midline and mucous membranes are normal. Posterior oropharyngeal erythema present. No oropharyngeal exudate or posterior oropharyngeal edema.  Eyes: EOM are normal. Pupils are equal, round, and reactive to light.  Neck: Neck supple.  Cardiovascular: Normal rate and regular rhythm.   Pulmonary/Chest: Effort normal. She has wheezes.  Musculoskeletal: Normal range of motion. She exhibits no edema.  Neurological: She is alert and oriented to person, place, and time. No cranial nerve deficit.  Skin: Skin is warm and dry.  Psychiatric: She has a normal mood and affect. Her behavior  is normal. Judgment and thought content normal.   Procedures  Labs Reviewed - No data to display Dg Chest 2 View  05/18/2012  *RADIOLOGY REPORT*  Clinical Data: Influenza.  CHEST - 2 VIEW  Comparison: Chest x-ray 10/12/2009.  Findings: Lung volumes are normal.  No consolidative airspace disease.  No pleural effusions.  No pneumothorax.  No pulmonary nodule or mass noted.  Pulmonary vasculature and the cardiomediastinal silhouette are within normal limits. Atherosclerotic calcifications within the arch of the aorta. Orthopedic fixation hardware in the lower cervical spine.  Surgical clips project over the right upper quadrant of the abdomen, likely from prior cholecystectomy.  IMPRESSION: 1. No radiographic evidence of acute cardiopulmonary disease. 2.  Atherosclerosis.   Original Report Authenticated By: Trudie Reed, M.D.    Assessment: 52 y.o. female with bronchitis  Plan:  Treat symptoms Discussed with the patient and all questioned fully answered. She willreturn if any problems arise.    Medication List    TAKE these medications       albuterol 108 (90 BASE) MCG/ACT inhaler  Commonly known as:  PROVENTIL HFA;VENTOLIN HFA  Inhale 1-2 puffs into the lungs every 6 (six) hours as needed for wheezing.     guaiFENesin-codeine 100-10 MG/5ML syrup  Commonly known as:  ROBITUSSIN AC  Take 5 mLs by mouth 3 (three) times daily as needed for cough.      ASK your doctor about these medications       alendronate 70 MG tablet  Commonly known as:  FOSAMAX  Take 70 mg by mouth every 7 (seven) days. Takes on Sunday. Take with a full glass of water on an empty stomach.     aspirin EC 81 MG tablet  Take 81 mg by mouth daily as needed.     CALCIUM 600 + D PO  Take 1 tablet by mouth daily.     losartan-hydrochlorothiazide 50-12.5 MG per tablet  Commonly known as:  HYZAAR  Take 1 tablet by mouth daily.     oxyCODONE 5 MG immediate release tablet  Commonly known as:  Oxy IR/ROXICODONE  Take  5 mg by mouth 3 (three) times daily.     pantoprazole 40 MG tablet  Commonly known as:  PROTONIX  Take 1 tablet by mouth Daily.     PARoxetine 20 MG tablet  Commonly known as:  PAXIL  Take 20 mg by mouth daily.              Janne Napoleon, [place] 05/18/12 [phone]

## 2012-05-18 NOTE — ED Notes (Signed)
Flu like sx with cough, congestion and right ear pain x 4 days.

## 2012-05-18 NOTE — ED Provider Notes (Signed)
Medical screening examination/treatment/procedure(s) were performed by non-physician practitioner and as supervising physician I was immediately available for consultation/collaboration.   Lani Mendiola L Esaw Knippel, MD 05/18/12 1534 

## 2012-07-02 ENCOUNTER — Other Ambulatory Visit (HOSPITAL_COMMUNITY): Payer: Self-pay | Admitting: Family Medicine

## 2012-07-02 ENCOUNTER — Ambulatory Visit (HOSPITAL_COMMUNITY)
Admission: RE | Admit: 2012-07-02 | Discharge: 2012-07-02 | Disposition: A | Discharge status: Home / Self Care | Payer: Medicare Other | Source: Ambulatory Visit | Attending: Family Medicine | Admitting: Family Medicine

## 2012-07-02 DIAGNOSIS — M1120 Other chondrocalcinosis, unspecified site: Secondary | ICD-10-CM | POA: Insufficient documentation

## 2012-07-02 DIAGNOSIS — M1990 Unspecified osteoarthritis, unspecified site: Secondary | ICD-10-CM

## 2012-07-02 DIAGNOSIS — M25569 Pain in unspecified knee: Secondary | ICD-10-CM | POA: Insufficient documentation

## 2013-09-19 ENCOUNTER — Encounter (HOSPITAL_COMMUNITY): Payer: Self-pay | Admitting: Emergency Medicine

## 2013-09-19 ENCOUNTER — Emergency Department (HOSPITAL_COMMUNITY): Payer: Medicare Other

## 2013-09-19 ENCOUNTER — Emergency Department (HOSPITAL_COMMUNITY)
Admission: EM | Admit: 2013-09-19 | Discharge: 2013-09-19 | Disposition: A | Discharge status: Home / Self Care | Payer: Medicare Other | Attending: Emergency Medicine | Admitting: Emergency Medicine

## 2013-09-19 DIAGNOSIS — Z87891 Personal history of nicotine dependence: Secondary | ICD-10-CM | POA: Diagnosis not present

## 2013-09-19 DIAGNOSIS — S93499A Sprain of other ligament of unspecified ankle, initial encounter: Secondary | ICD-10-CM | POA: Diagnosis not present

## 2013-09-19 DIAGNOSIS — M810 Age-related osteoporosis without current pathological fracture: Secondary | ICD-10-CM | POA: Diagnosis not present

## 2013-09-19 DIAGNOSIS — F411 Generalized anxiety disorder: Secondary | ICD-10-CM | POA: Diagnosis not present

## 2013-09-19 DIAGNOSIS — Y9289 Other specified places as the place of occurrence of the external cause: Secondary | ICD-10-CM | POA: Diagnosis not present

## 2013-09-19 DIAGNOSIS — S99919A Unspecified injury of unspecified ankle, initial encounter: Secondary | ICD-10-CM | POA: Diagnosis present

## 2013-09-19 DIAGNOSIS — Z7983 Long term (current) use of bisphosphonates: Secondary | ICD-10-CM | POA: Insufficient documentation

## 2013-09-19 DIAGNOSIS — S93401A Sprain of unspecified ligament of right ankle, initial encounter: Secondary | ICD-10-CM

## 2013-09-19 DIAGNOSIS — Z79899 Other long term (current) drug therapy: Secondary | ICD-10-CM | POA: Insufficient documentation

## 2013-09-19 DIAGNOSIS — I10 Essential (primary) hypertension: Secondary | ICD-10-CM | POA: Diagnosis not present

## 2013-09-19 DIAGNOSIS — Y9301 Activity, walking, marching and hiking: Secondary | ICD-10-CM | POA: Diagnosis not present

## 2013-09-19 DIAGNOSIS — S96819A Strain of other specified muscles and tendons at ankle and foot level, unspecified foot, initial encounter: Secondary | ICD-10-CM | POA: Diagnosis not present

## 2013-09-19 DIAGNOSIS — X500XXA Overexertion from strenuous movement or load, initial encounter: Secondary | ICD-10-CM | POA: Insufficient documentation

## 2013-09-19 DIAGNOSIS — Z7982 Long term (current) use of aspirin: Secondary | ICD-10-CM | POA: Diagnosis not present

## 2013-09-19 DIAGNOSIS — S8990XA Unspecified injury of unspecified lower leg, initial encounter: Secondary | ICD-10-CM | POA: Diagnosis present

## 2013-09-19 NOTE — Discharge Instructions (Signed)
Take ibuprofen with the Lortab you have. Apply ice, elevate and follow up with Dr. Hilda LiasKeeling if symptoms persist. Return here as needed.

## 2013-09-19 NOTE — ED Provider Notes (Signed)
CSN: [id number]     Arrival date & time 09/19/13  1353 History   None    Chief Complaint  Patient presents with  . Ankle Pain   Patient is a 53 y.o. female presenting with ankle pain. The history is provided by the patient. No language interpreter was used.  Ankle Pain  This chart was scribed for nurse practitioner working with Donnetta HutchingBrian Cook, MD, by Andrew Auaven Small, ED Scribe. This patient was seen in room APFT21/APFT21 and the patient's care was started at 2:07 PM.  Alyssa Poole is a 53 y.o. female who presents to the Emergency Department complaining of right ankle pain and swelling. States she twisted her ankle a couple of day ago when she slipped in the garden and again 1 day ago while walking. She rates her pain 8/10. States swelling goes down when lying down but returns with standing. Pt has tried pain medication without relief. Pt has not taken ibuprofen. She has not tried ice, heat or ace wrap. The patient is a homemaker.    Past Medical History  Diagnosis Date  . Hypertension   . Anxiety   . Osteoporosis    Past Surgical History  Procedure Laterality Date  . Back surgery    . Cervical spine surgery    . Cholecystectomy     No family history on file. History  Substance Use Topics  . Smoking status: Former Games developermoker  . Smokeless tobacco: Not on file  . Alcohol Use: No   OB History   Grav Para Term Preterm Abortions TAB SAB Ect Mult Living                 Review of Systems  Musculoskeletal:       Ankle pain and swelling  all other systems negative  Allergies  Bee venom  Home Medications   Prior to Admission medications   Medication Sig Start Date End Date Taking? Authorizing Provider  albuterol (PROVENTIL HFA;VENTOLIN HFA) 108 (90 BASE) MCG/ACT inhaler Inhale 1-2 puffs into the lungs every 6 (six) hours as needed for wheezing. 05/18/12   Hope Orlene OchM Neese, NP  alendronate (FOSAMAX) 70 MG tablet Take 70 mg by mouth every 7 (seven) days. Takes on Sunday. Take with a full  glass of water on an empty stomach.    Historical Provider, MD  aspirin EC 81 MG tablet Take 81 mg by mouth daily as needed.    Historical Provider, MD  Calcium Carbonate-Vitamin D (CALCIUM 600 + D PO) Take 1 tablet by mouth daily.      Historical Provider, MD  guaiFENesin-codeine (ROBITUSSIN AC) 100-10 MG/5ML syrup Take 5 mLs by mouth 3 (three) times daily as needed for cough. 05/18/12   Hope Orlene OchM Neese, NP  losartan-hydrochlorothiazide (HYZAAR) 50-12.5 MG per tablet Take 1 tablet by mouth daily.      Historical Provider, MD  oxyCODONE (OXY IR/ROXICODONE) 5 MG immediate release tablet Take 5 mg by mouth 3 (three) times daily.    Historical Provider, MD  pantoprazole (PROTONIX) 40 MG tablet Take 1 tablet by mouth Daily. 01/27/12   Historical Provider, MD  PARoxetine (PAXIL) 20 MG tablet Take 20 mg by mouth daily.     Historical Provider, MD   Triage Vitals- BP 110/63  Pulse 65  Temp(Src) 98.3 F (36.8 C) (Oral)  Resp 16  Ht 5\' 3"  (1.6 m)  Wt 152 lb (68.947 kg)  BMI 26.93 kg/m2  SpO2 97% Physical Exam  Nursing note and vitals reviewed. Constitutional: She  is oriented to person, place, and time. She appears well-developed and well-nourished. No distress.  HENT:  Head: Normocephalic and atraumatic.  Eyes: Conjunctivae and EOM are normal.  Neck: Neck supple.  Cardiovascular: Normal rate.   Pulmonary/Chest: Effort normal. No respiratory distress.  Musculoskeletal:       Right ankle: She exhibits swelling and ecchymosis. She exhibits no deformity, no laceration and normal pulse. Decreased range of motion: due to pain. Tenderness. Lateral malleolus tenderness found. Achilles tendon normal.  Good pulses in bilateral feet and ankles. Good circulation.No pain with dorsal flexion. normal achilles tendon. Pain with eversion and inversion. Pain on lateral aspect of ankle with plantar flexion Pain over lateral malleolus .minimal swelling noted No calf pain  Neurological: She is alert and oriented to  person, place, and time.  Skin: Skin is warm and dry.  Psychiatric: She has a normal mood and affect. Her behavior is normal.    ED Course  Procedures 2:47 PM-Discussed treatment plan which includes ASO, Ice, Elevation, and Crutches. Patient agreed to plan. She will follow up with ortho if symptoms persist.  Dg Ankle Complete Right  09/19/2013   CLINICAL DATA:  Proximal foot and ankle pain  EXAM: RIGHT ANKLE - COMPLETE 3+ VIEW  COMPARISON:  None.  FINDINGS: There is no evidence of fracture, dislocation, or joint effusion. Small plantar calcaneal spur. Soft tissues are unremarkable.  IMPRESSION: No acute osseous injury of the right ankle.   Electronically Signed   By: Elige KoHetal  Patel   On: 09/19/2013 14:23     MDM  53 y.o. female with right ankle pain and swelling s/p injury x2 recently. Stable for discharge without neurovascular compromise. I have reviewed this patient's vital signs, nurses notes, appropriate labs and imaging.  I have discussed findings and plan of care with the patient and she voices understanding.   I personally performed the services described in this documentation, which was scribed in my presence. The recorded information has been reviewed and is accurate.      [facility] Orlene OchM Neese, [place] 09/19/13 1714

## 2013-09-19 NOTE — ED Notes (Signed)
Pt states she twisted her right ankle 2-3 days ago and again yesterday. Pt refused wheelchair. Able to ambulate without difficulty. Pt states intermittent swelling to the ankle, worse in the mornings.

## 2013-09-22 NOTE — ED Provider Notes (Signed)
Medical screening examination/treatment/procedure(s) were performed by non-physician practitioner and as supervising physician I was immediately available for consultation/collaboration.   EKG Interpretation None       Donnetta HutchingBrian Cook, MD 09/22/13 [phone]

## 2013-12-27 ENCOUNTER — Ambulatory Visit (INDEPENDENT_AMBULATORY_CARE_PROVIDER_SITE_OTHER): Payer: Medicare Other

## 2013-12-27 ENCOUNTER — Encounter: Payer: Self-pay | Admitting: Orthopedic Surgery

## 2013-12-27 ENCOUNTER — Ambulatory Visit (INDEPENDENT_AMBULATORY_CARE_PROVIDER_SITE_OTHER): Payer: Medicare Other | Admitting: Orthopedic Surgery

## 2013-12-27 VITALS — BP 122/68 | Ht 63.0 in | Wt 152.0 lb

## 2013-12-27 DIAGNOSIS — M1712 Unilateral primary osteoarthritis, left knee: Secondary | ICD-10-CM

## 2013-12-27 DIAGNOSIS — M23322 Other meniscus derangements, posterior horn of medial meniscus, left knee: Secondary | ICD-10-CM

## 2013-12-27 DIAGNOSIS — IMO0002 Reserved for concepts with insufficient information to code with codable children: Secondary | ICD-10-CM

## 2013-12-27 DIAGNOSIS — S8000XA Contusion of unspecified knee, initial encounter: Secondary | ICD-10-CM | POA: Insufficient documentation

## 2013-12-27 DIAGNOSIS — M1710 Unilateral primary osteoarthritis, unspecified knee: Secondary | ICD-10-CM

## 2013-12-27 DIAGNOSIS — M25569 Pain in unspecified knee: Secondary | ICD-10-CM

## 2013-12-27 DIAGNOSIS — M23329 Other meniscus derangements, posterior horn of medial meniscus, unspecified knee: Secondary | ICD-10-CM

## 2013-12-27 DIAGNOSIS — M25562 Pain in left knee: Secondary | ICD-10-CM

## 2013-12-27 DIAGNOSIS — S8002XA Contusion of left knee, initial encounter: Secondary | ICD-10-CM

## 2013-12-27 NOTE — Patient Instructions (Signed)
Joint Injection Care After Refer to this sheet in the next few days. These instructions provide you with information on caring for yourself after you have had a joint injection. Your caregiver also may give you more specific instructions. Your treatment has been planned according to current medical practices, but problems sometimes occur. Call your caregiver if you have any problems or questions after your procedure. After any type of joint injection, it is not uncommon to experience:  Soreness, swelling, or bruising around the injection site.  Mild numbness, tingling, or weakness around the injection site caused by the numbing medicine used before or with the injection. It also is possible to experience the following effects associated with the specific agent after injection:  Iodine-based contrast agents:  Allergic reaction (itching, hives, widespread redness, and swelling beyond the injection site).  Corticosteroids (These effects are rare.):  Allergic reaction.  Increased blood sugar levels (If you have diabetes and you notice that your blood sugar levels have increased, notify your caregiver).  Increased blood pressure levels.  Mood swings.  Hyaluronic acid in the use of viscosupplementation.  Temporary heat or redness.  Temporary rash and itching.  Increased fluid accumulation in the injected joint. These effects all should resolve within a day after your procedure.  HOME CARE INSTRUCTIONS  Limit yourself to light activity the day of your procedure. Avoid lifting heavy objects, bending, stooping, or twisting.  Take prescription or over-the-counter pain medication as directed by your caregiver.  You may apply ice to your injection site to reduce pain and swelling the day of your procedure. Ice may be applied 03-04 times:  Put ice in a plastic bag.  Place a towel between your skin and the bag.  Leave the ice on for no longer than 15-20 minutes each time. SEEK  IMMEDIATE MEDICAL CARE IF:   Pain and swelling get worse rather than better or extend beyond the injection site.  Numbness does not go away.  Blood or fluid continues to leak from the injection site.  You have chest pain.  You have swelling of your face or tongue.  You have trouble breathing or you become dizzy.  You develop a fever, chills, or severe tenderness at the injection site that last longer than 1 day. MAKE SURE YOU:  Understand these instructions.  Watch your condition.  Get help right away if you are not doing well or if you get worse. Document Released: 11/29/2010 Document Revised: 06/10/2011 Document Reviewed: 11/29/2010 [facility] Patient Information 2015 [place], [place]. This information is not intended to replace advice given to you by your health care provider. Make sure you discuss any questions you have with your health care provider.  Wear brace and take hydrocodone and naprosyn for next 6 weeks

## 2013-12-27 NOTE — Progress Notes (Signed)
New patient office visit   Chief Complaint  Patient presents with  . Knee Pain    left knee pain s/p fall two weeks ago   Knee Pain Patient complains of left knee pain. This is evaluated as a personal injury.  Location the patient complains of pain on the medial side of her left knee which has been present for several years but has increased and she fell 2 weeks ago. She complains of burning stabbing pain morning and night and rates her pain 10 out of 10. She denies catching locking but complains of lack of flexion since she hurt her knee. She is oriented hydrocodone and naproxen without relief. Her pain is worse with knee flexion and with weightbearing.  Review of systems she complains that she does have dental problems, vision problems, irregular heartbeat and chest pain with ankle and leg swelling she complains of joint pain and back pain lightheadedness and dizziness which actually cause her to fall her remaining review of systems were reviewed and were negative.  Medical history of hypertension. She listed no surgeries. She takes alendronate for osteoporosis she takes black cohosh, losartan with hydrochlorothiazide and a proton pump inhibitor as well as 7.5 mg of hydrocodone. Meclizine. Naproxen 500 mg. 600 mg of calcium. Lyrica 50 mg and lorazepam 0.5 mg as well as piroxicam 20 mg  Allergies she denies any allergies.  Family history diabetes hypertension heart attack cancer arthritis.  Denies smoking drinking or drug use   VS BP 122/68  Ht  (1.6 m)  Wt 152 lb (68.947 kg)  BMI 26.93 kg/m2  APPEARANCE overall normal grooming and hygiene.   ORIENTATION oriented to person place and time appropriate   MOOD anxious but pleasant   GAIT no significant abnormality in terms of limping or gait pattern   The patient's upper extremities show normal range of motion, stability and strength skin is normal without deformity. The pulses and perfusion are intact without  lymphadenopathy.  The patient's lower extremities have normal pulses and temperature without edema or swelling or significant varicose veins. She has no lymphadenopathy in the groin sensation and reflexes are normal and balance is normal.  Shows full range of motion in the right knee with no instability. Strength and muscle tone are normal skin is normal as well and there are no deformities in the right knee.     left Knee   Swelling: None  Effusion: Negative  Tenderness: Medial joint line     Range of Motion:     Extension: Normal    Flexion: 100     McMurrays: Positive Medial  Anterior Lachmann: Negative  Posterior Lachmann: Negative  Anterior Drawer: Negative  Posterior Drawer: Negative  Varus Stress Test: Negative  Valgus Stress Test: Negative  Pivot Shift: Negative  Patellar Apprehension: No   IMAGING I have ordered and interpreted her x-ray as noted below with moderate osteoarthritis without acute fracture  DX: Differential diagnosis acute exacerbation of underlying osteoarthritis, knee contusion, medial meniscal tear.  PLAN:  She is agreed to injection in the left knee and we'll place her in a knee brace and come back in 6 weeks for further follow-up  Procedure note   left knee injection verbal consent was obtained to inject left knee joint  Timeout was completed to confirm the site of injection  The medications used were 40 mg of Depo-Medrol and 1% lidocaine 3 cc  Anesthesia was provided by ethyl chloride and the skin was prepped with alcohol.  After  cleaning the skin with alcohol a 20-gauge needle was used to inject the left knee joint. There were no complications. A sterile bandage was applied.        Radiology report  3 views left knee  The patient fell  Medial joint space narrowing is noted. Mild chronic bone is noted. No cyst formation. No significant osteophyte formation.  Impression moderate osteoarthritis primarily medial compartment no  fracture

## 2014-02-08 ENCOUNTER — Encounter: Payer: Self-pay | Admitting: Orthopedic Surgery

## 2014-02-08 ENCOUNTER — Ambulatory Visit (INDEPENDENT_AMBULATORY_CARE_PROVIDER_SITE_OTHER): Payer: Medicare Other | Admitting: Orthopedic Surgery

## 2014-02-08 VITALS — BP 119/62 | Ht 63.0 in | Wt 152.0 lb

## 2014-02-08 DIAGNOSIS — M23322 Other meniscus derangements, posterior horn of medial meniscus, left knee: Secondary | ICD-10-CM

## 2014-02-08 NOTE — Progress Notes (Signed)
Patient ID: Alyssa RuskBobbie W Poole, female   DOB: December 19, 1960, 53 y.o.   MRN: [id number] Chief Complaint  Patient presents with  . Follow-up    6 week recheck left knee    The patient comes in with continued complaints in her left knee despite intra-articular injection, hydrocodone and naproxen and a  brace brace for 6 weeks.she is especially bothered by the mechanical symptoms of catching and locking in the left knee.  Review of systems has not changed since her last visit on 12/27/2013 Review of systems she complains that she does have dental problems, vision problems, irregular heartbeat and chest pain with ankle and leg swelling she complains of joint pain and back pain lightheadedness and dizziness which actually cause her to fall her remaining review of systems were reviewed and were negative.  At this point her pain and mechanical symptoms have worsened so I would recommend that she go ahead and be scheduled for MRI of her left knee to check for medial meniscal tear.  In review she had medial joint line tenderness flexion  of only 100 and a positive McMurray's  Impression torn medial meniscus left knee MRI left knee  She should continue hydrocodone and knee bracing

## 2014-02-08 NOTE — Patient Instructions (Signed)
Mri left knee medial meniscus tear

## 2014-02-15 ENCOUNTER — Ambulatory Visit (HOSPITAL_COMMUNITY)
Admission: RE | Admit: 2014-02-15 | Discharge: 2014-02-15 | Disposition: A | Discharge status: Home / Self Care | Payer: Medicare Other | Source: Ambulatory Visit | Attending: Orthopedic Surgery | Admitting: Orthopedic Surgery

## 2014-02-15 DIAGNOSIS — M25562 Pain in left knee: Secondary | ICD-10-CM | POA: Diagnosis present

## 2014-02-15 DIAGNOSIS — M2242 Chondromalacia patellae, left knee: Secondary | ICD-10-CM | POA: Diagnosis not present

## 2014-02-15 DIAGNOSIS — M23322 Other meniscus derangements, posterior horn of medial meniscus, left knee: Secondary | ICD-10-CM

## 2014-02-15 DIAGNOSIS — M2342 Loose body in knee, left knee: Secondary | ICD-10-CM | POA: Insufficient documentation

## 2014-02-28 ENCOUNTER — Ambulatory Visit (INDEPENDENT_AMBULATORY_CARE_PROVIDER_SITE_OTHER): Payer: Medicare Other | Admitting: Orthopedic Surgery

## 2014-02-28 ENCOUNTER — Encounter: Payer: Self-pay | Admitting: Orthopedic Surgery

## 2014-02-28 ENCOUNTER — Other Ambulatory Visit: Payer: Self-pay | Admitting: *Deleted

## 2014-02-28 VITALS — BP 126/62 | Ht 63.0 in | Wt 144.0 lb

## 2014-02-28 DIAGNOSIS — M129 Arthropathy, unspecified: Secondary | ICD-10-CM

## 2014-02-28 DIAGNOSIS — M1712 Unilateral primary osteoarthritis, left knee: Secondary | ICD-10-CM

## 2014-02-28 DIAGNOSIS — M2342 Loose body in knee, left knee: Secondary | ICD-10-CM

## 2014-02-28 NOTE — Patient Instructions (Addendum)
Surgery dec 9 SALK loose body removal  You have decided to proceed with operative arthroscopy of the knee. You have decided not to continue with nonoperative measures such as but not limited to oral medication, weight loss, activity modification, physical therapy, bracing, or injection.  We will perform operative arthroscopy of the knee. Some of the risks associated with arthroscopic surgery of the knee include but are not limited to Bleeding Infection Swelling Stiffness Blood clot Pain  If you're not comfortable with these risks and would like to continue with nonoperative treatment please let Dr. Romeo AppleHarrison know prior to your surgery.  Arthroscopy Arthroscopy is a procedure in which a caregiver uses an arthroscope. An arthroscope is an instrument that allows your caregiver to look directly into a joint. It is like a small telescope attached to a video camera and is similar in size to a pencil. Arthroscopes let your caregiver see inside your joint on an attached television monitor. Most joints in the human body can be examined and surgery can be performed through the arthroscope using small incisions. Prior to the use of arthroscopes, surgeries were done with larger open incisions, which require longer recovery times. On occasion, arthroscopic procedures result in complications such as bleeding, swelling, and pain. If a complication results, a longer recovery and rehabilitation may be required. INDICATIONS Arthroscopic procedures were developed to remove, repair, or replace (reconstruct) damaged tissue. Arthroscopy can be performed if the procedure involves trimming tissue, removing fragments of cartilage or bone (loose bodies) within joints, suctioning debris, biopsy of tissue, smoothing rough surfaces, removing inflamed tissue, shrinking tissue, or sewing (suturing), tacking, or stapling cartilage and ligaments. What can be done is dependent on many factors. Arthroscopy allows for surgeons to  perform certain surgical procedures. Most of the surgeries you can go home the same day as the procedure (outpatient procedures) because the procedure does not cause as much trauma to the patient. Arthroscopy is a valuable diagnostic tool. Radiographs (such as X-ray and CT scans) have poor ability at showing soft tissue, whereas arthroscopy gives the caregiver direct visualization of soft tissue, cartilage, and bone. However, the emergence of magnetic resonance imaging (MRI) has lessened the need for arthroscopy as a diagnostic tool.  TECHNIQUE  Repair and reconstruction arthroscopic techniques may require additional and/or larger incisions than diagnostic arthroscopy portals (-inch incisions). The procedures are often more extensive in repair and reconstruction than excision procedures. Therefore, the patient may need to stay in the hospital overnight after arthroscopic repair or reconstruction. These procedures also disrupt more tissue and discomfort may occur, so the temporary use of braces, casts, or crutches, as well as rehabilitation, may be needed.  In order to undergo an arthroscopic procedure, a complete evaluation is necessary in order to provide the caregiver with as accurate a diagnosis as possible. Sometimes it is necessary to perform diagnostic arthroscopy before another surgery can be scheduled.  Both diagnostic and surgical arthroscopy can be performed under local anesthesia (only the joint is numbed), regional anesthesia (the operative limb is numbed), spinal or epidural anesthesia (only the lower extremities are numbed), or general anesthesia (you are completely asleep). The type of anesthetic is dependent on the patient, the surgeon, and the procedure being performed.  If you ask prior to the operation, you may be able to obtain pictures or a video from the arthroscopic camera.  Do not eat or drink anything for at least 8 hours before surgery. Food and drinks (including coffee) make  general anesthesia more hazardous. SEEK MEDICAL  CARE IF:  You experience pain, numbness, or coldness in the extremity operated on.  Blue, gray, or dark color appears in the fingers or toenails.  You have increased pain, swelling, redness, drainage, or bleeding in the surgical area despite rest, ice, elevation, and pain medications.  You have signs of infection, including a fever 102F (38.9C) or higher. Document Released: 10/17/2004 Document Revised: 08/02/2013 Document Reviewed: 06/30/2008 [facility] Patient Information 2015 [place], [place]. This information is not intended to replace advice given to you by your health care provider. Make sure you discuss any questions you have with your health care provider.

## 2014-02-28 NOTE — Progress Notes (Signed)
Patient ID: Alyssa Poole, female   DOB: Mar 26, 1961, 53 y.o.   MRN: [id number] t Chief Complaint  Patient presents with  . Follow-up    review MRI left knee    The patient came in today to have her left knee reevaluated with the MRI report. 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   She decided that she would like to proceed with arthroscopic surgery understanding that she would still have some arthritis pain, but her mechanical symptoms should improve. Preoperative counseling risk / benefit of surgical and continued nonoperative treatment explained and understood.    Past Medical History  Diagnosis Date  . Hypertension   . Anxiety   . Osteoporosis    Past Surgical History  Procedure Laterality Date  . Back surgery    . Cervical spine surgery    . Cholecystectomy     History reviewed. No pertinent family history. History  Substance Use Topics  . Smoking status: Former Games developermoker  . Smokeless tobacco: Not on file  . Alcohol Use: No    Review of systems reveals that she has some dental problems vision problems irregular heartbeat chest pain ankle leg swelling none currently history of back pain back surgery. History lightheadedness dizziness. Other review of systems negative.  VS BP 126/62 mmHg  Ht 5\' 3"  (1.6 m)  Wt 144 lb (65.318 kg)  BMI 25.51 kg/m2  Gen. appearance is normal The patient is alert and oriented person place and time Mood is normal affect is normal Ambulatory status  normal  The patient's upper extremities are normal  Left lower extremity reveals tenderness over the medial joint line with continued diffuse pain. She does exhibit some limitation of knee flexion and medial joint line tenderness her McMurray sign was positive but her ligaments were stable. Motor exam was normal skin was intact good distal pulses normal sensation  She had general normal lymph nodes  Right lower extremity normal  Impression osteoarthritis with loose bodies left knee  Recommend arthroscopy left knee and removal of loose bodies.  Preop visit preop history and physical complete.

## 2014-03-02 ENCOUNTER — Telehealth: Payer: Self-pay | Admitting: Orthopedic Surgery

## 2014-03-02 NOTE — Patient Instructions (Signed)
Alyssa RuskBobbie W Benton  03/02/2014   Your procedure is scheduled on:  03/09/2014  [facility].  Call this number if you have problems the morning of surgery: [phone]   Remember:   Do not eat food or drink liquids after midnight.   Take these medicines the morning of surgery with A SIP OF WATER: Hydrocodone, Losartan/HCTZ, Protonix, Paxil, Lyrica   Do not wear jewelry, make-up or nail polish.  Do not wear lotions, powders, or perfumes. You may wear deodorant.  Do not shave 48 hours prior to surgery. Men may shave face and neck.  Do not bring valuables to the hospital.  [facility] any belongings or valuables.               Contacts, dentures or bridgework may not be worn into surgery.  Leave suitcase in the car. After surgery it may be brought to your room.  For patients admitted to the hospital, discharge time is determined by your treatment team.               Patients discharged the day of surgery will not be allowed to drive home.  Name and phone number of your driver:   Special Instructions: Shower using CHG 2 nights before surgery and the night before surgery.  If you shower the day of surgery use CHG.  Use special wash - you have one bottle of CHG for all showers.  You should use approximately 1/3 of the bottle for each shower.   Please read over the following fact sheets that you were given: Surgical Site Infection Prevention and Anesthesia Post-op Instructions   PATIENT INSTRUCTIONS POST-ANESTHESIA  IMMEDIATELY FOLLOWING SURGERY:  Do not drive or operate machinery for the first twenty four hours after surgery.  Do not make any important decisions for twenty four hours after surgery or while taking narcotic pain medications or sedatives.  If you develop intractable nausea and vomiting or a severe headache please notify your doctor immediately.  FOLLOW-UP:  Please make an appointment with your surgeon as instructed. You do not need to  follow up with anesthesia unless specifically instructed to do so.  WOUND CARE INSTRUCTIONS (if applicable):  Keep a dry clean dressing on the anesthesia/puncture wound site if there is drainage.  Once the wound has quit draining you may leave it open to air.  Generally you should leave the bandage intact for twenty four hours unless there is drainage.  If the epidural site drains for more than 36-48 hours please call the anesthesia department.  QUESTIONS?:  Please feel free to call your physician or the hospital operator if you have any questions, and they will be happy to assist you.      Arthroscopic Procedure, Knee An arthroscopic procedure can find what is wrong with your knee. PROCEDURE Arthroscopy is a surgical technique that allows your orthopedic surgeon to diagnose and treat your knee injury with accuracy. They will look into your knee through a small instrument. This is almost like a small (pencil sized) telescope. Because arthroscopy affects your knee less than open knee surgery, you can anticipate a more rapid recovery. Taking an active role by following your caregiver's instructions will help with rapid and complete recovery. Use crutches, rest, elevation, ice, and knee exercises as instructed. The length of recovery depends on various factors including type of injury, age, physical condition, medical conditions, and your rehabilitation. Your knee is the joint between the large bones (  femur and tibia) in your leg. Cartilage covers these bone ends which are smooth and slippery and allow your knee to bend and move smoothly. Two menisci, thick, semi-lunar shaped pads of cartilage which form a rim inside the joint, help absorb shock and stabilize your knee. Ligaments bind the bones together and support your knee joint. Muscles move the joint, help support your knee, and take stress off the joint itself. Because of this all programs and physical therapy to rehabilitate an injured or repaired knee  require rebuilding and strengthening your muscles. AFTER THE PROCEDURE  After the procedure, you will be moved to a recovery area until most of the effects of the medication have worn off. Your caregiver will discuss the test results with you.  Only take over-the-counter or prescription medicines for pain, discomfort, or fever as directed by your caregiver. SEEK MEDICAL CARE IF:   You have increased bleeding from your wounds.  You see redness, swelling, or have increasing pain in your wounds.  You have pus coming from your wound.  You have an oral temperature above 102 F (38.9 C).  You notice a bad smell coming from the wound or dressing.  You have severe pain with any motion of your knee. SEEK IMMEDIATE MEDICAL CARE IF:   You develop a rash.  You have difficulty breathing.  You have any allergic problems. Document Released: 03/15/2000 Document Revised: 06/10/2011 Document Reviewed: 10/07/2007 [facility] Patient Information 2015 [place], [place]. This information is not intended to replace advice given to you by your health care provider. Make sure you discuss any questions you have with your health care provider.

## 2014-03-02 NOTE — Telephone Encounter (Signed)
Regarding out-patient surgery [facility], arthroscopic knee procedure, CPT [phone] with possible [id number]. Per primary insurer, Medicare guidelines, no pre-authorization required. Per secondary insurer, Medicaid, online [employer] system, no pre-authorization required.

## 2014-03-03 ENCOUNTER — Encounter (HOSPITAL_COMMUNITY): Payer: Self-pay

## 2014-03-03 ENCOUNTER — Encounter (HOSPITAL_COMMUNITY)
Admission: RE | Admit: 2014-03-03 | Discharge: 2014-03-03 | Disposition: A | Discharge status: Home / Self Care | Payer: Medicare Other | Source: Ambulatory Visit | Attending: Orthopedic Surgery | Admitting: Orthopedic Surgery

## 2014-03-03 ENCOUNTER — Other Ambulatory Visit: Payer: Self-pay

## 2014-03-03 DIAGNOSIS — Z8614 Personal history of Methicillin resistant Staphylococcus aureus infection: Secondary | ICD-10-CM | POA: Diagnosis not present

## 2014-03-03 DIAGNOSIS — Z01812 Encounter for preprocedural laboratory examination: Secondary | ICD-10-CM | POA: Diagnosis present

## 2014-03-03 LAB — CBC
HEMATOCRIT: 33.9 % — AB (ref 36.0–46.0)
HEMOGLOBIN: 11.6 g/dL — AB (ref 12.0–15.0)
MCH: 32 pg (ref 26.0–34.0)
MCHC: 34.2 g/dL (ref 30.0–36.0)
MCV: 93.4 fL (ref 78.0–100.0)
Platelets: 399 10*3/uL (ref 150–400)
RBC: 3.63 MIL/uL — ABNORMAL LOW (ref 3.87–5.11)
RDW: 12.8 % (ref 11.5–15.5)
WBC: 6.4 10*3/uL (ref 4.0–10.5)

## 2014-03-03 LAB — BASIC METABOLIC PANEL
ANION GAP: 16 — AB (ref 5–15)
BUN: 16 mg/dL (ref 6–23)
CO2: 24 mEq/L (ref 19–32)
CREATININE: 0.94 mg/dL (ref 0.50–1.10)
Calcium: 10.7 mg/dL — ABNORMAL HIGH (ref 8.4–10.5)
Chloride: 100 mEq/L (ref 96–112)
GFR calc Af Amer: 78 mL/min — ABNORMAL LOW (ref >90)
GFR calc non Af Amer: 67 mL/min — ABNORMAL LOW (ref >90)
GLUCOSE: 83 mg/dL (ref 70–99)
Potassium: 3.6 mEq/L — ABNORMAL LOW (ref 3.7–5.3)
Sodium: 140 mEq/L (ref 137–147)

## 2014-03-03 LAB — SURGICAL PCR SCREEN
MRSA, PCR: NEGATIVE
STAPHYLOCOCCUS AUREUS: NEGATIVE

## 2014-03-08 NOTE — H&P (Signed)
  Patient ID: Alyssa Poole, female   DOB: [birth year], 53 y.o.   MRN: [id number] t Chief Complaint   Patient presents with   .  Follow-up        left knee pain    The patient came in today to have her left knee reevaluated with the MRI report. 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   She decided that she would like to proceed with arthroscopic surgery understanding that she would still have some arthritis pain, but her mechanical symptoms should improve. Preoperative counseling risk / benefit of surgical and continued nonoperative treatment explained and understood.   History  Substance Use Topics  . Smoking status: Former Smoker -- 1.50 packs/day for 35 years    Quit date: 03/03/2008  . Smokeless tobacco: Never Used  . Alcohol Use: No       Past Medical History   Diagnosis  Date   .  Hypertension     .  Anxiety     .  Osteoporosis      Past Surgical History   Procedure  Laterality  Date   .  Back surgery       .  Cervical spine surgery       .  Cholecystectomy        History reviewed. No pertinent family history. History   Substance Use Topics   .  Smoking status:  Former Games developermoker   .  Smokeless tobacco:  Not on file   .  Alcohol Use:  No     Review of systems reveals that she has some dental problems vision problems irregular heartbeat chest pain ankle leg swelling none currently history of back pain back surgery. History lightheadedness dizziness. Other review of systems  negative.  VS BP 126/62 mmHg  Ht 5\' 3"  (1.6 m)  Wt 144 lb (65.318 kg)  BMI 25.51 kg/m2  Gen. appearance is normal The patient is alert and oriented person place and time Mood is normal affect is normal Ambulatory status normal  The patient's upper extremities are normal  Left lower extremity reveals tenderness over the medial joint line with continued diffuse pain. She does exhibit some limitation of knee flexion and medial joint line tenderness her McMurray sign was positive but her ligaments were stable. Motor exam was normal skin was intact good distal pulses normal sensation  She had general normal lymph nodes  Right lower extremity normal  Impression osteoarthritis with loose bodies left knee  Recommend arthroscopy left knee and removal of loose bodies.  Preop visit preop history and physical complete.

## 2014-03-09 ENCOUNTER — Ambulatory Visit (HOSPITAL_COMMUNITY)
Admission: RE | Admit: 2014-03-09 | Discharge: 2014-03-09 | Disposition: A | Discharge status: Home / Self Care | Payer: Medicare Other | Source: Ambulatory Visit | Attending: Orthopedic Surgery | Admitting: Orthopedic Surgery

## 2014-03-09 ENCOUNTER — Telehealth: Payer: Self-pay | Admitting: Orthopedic Surgery

## 2014-03-09 ENCOUNTER — Encounter (HOSPITAL_COMMUNITY): Payer: Self-pay | Admitting: *Deleted

## 2014-03-09 ENCOUNTER — Ambulatory Visit (HOSPITAL_COMMUNITY): Payer: Medicare Other | Admitting: Anesthesiology

## 2014-03-09 ENCOUNTER — Encounter (HOSPITAL_COMMUNITY)
Admission: RE | Disposition: A | Discharge status: Home / Self Care | Payer: Self-pay | Source: Ambulatory Visit | Attending: Orthopedic Surgery

## 2014-03-09 DIAGNOSIS — S83242A Other tear of medial meniscus, current injury, left knee, initial encounter: Secondary | ICD-10-CM | POA: Insufficient documentation

## 2014-03-09 DIAGNOSIS — M129 Arthropathy, unspecified: Secondary | ICD-10-CM

## 2014-03-09 DIAGNOSIS — M1712 Unilateral primary osteoarthritis, left knee: Secondary | ICD-10-CM

## 2014-03-09 DIAGNOSIS — M2242 Chondromalacia patellae, left knee: Secondary | ICD-10-CM | POA: Diagnosis not present

## 2014-03-09 DIAGNOSIS — M179 Osteoarthritis of knee, unspecified: Secondary | ICD-10-CM | POA: Insufficient documentation

## 2014-03-09 DIAGNOSIS — M23322 Other meniscus derangements, posterior horn of medial meniscus, left knee: Secondary | ICD-10-CM

## 2014-03-09 DIAGNOSIS — M2342 Loose body in knee, left knee: Secondary | ICD-10-CM | POA: Insufficient documentation

## 2014-03-09 DIAGNOSIS — I10 Essential (primary) hypertension: Secondary | ICD-10-CM | POA: Diagnosis not present

## 2014-03-09 DIAGNOSIS — Y9289 Other specified places as the place of occurrence of the external cause: Secondary | ICD-10-CM | POA: Diagnosis not present

## 2014-03-09 DIAGNOSIS — X58XXXA Exposure to other specified factors, initial encounter: Secondary | ICD-10-CM | POA: Insufficient documentation

## 2014-03-09 DIAGNOSIS — Z87891 Personal history of nicotine dependence: Secondary | ICD-10-CM | POA: Insufficient documentation

## 2014-03-09 DIAGNOSIS — M810 Age-related osteoporosis without current pathological fracture: Secondary | ICD-10-CM | POA: Diagnosis not present

## 2014-03-09 DIAGNOSIS — F419 Anxiety disorder, unspecified: Secondary | ICD-10-CM | POA: Diagnosis not present

## 2014-03-09 DIAGNOSIS — M25562 Pain in left knee: Secondary | ICD-10-CM | POA: Diagnosis present

## 2014-03-09 HISTORY — PX: KNEE ARTHROSCOPY WITH MEDIAL MENISECTOMY: SHX5651

## 2014-03-09 SURGERY — ARTHROSCOPY, KNEE, WITH MEDIAL MENISCECTOMY
Anesthesia: General | Site: Knee | Laterality: Left

## 2014-03-09 MED ORDER — GLYCOPYRROLATE 0.2 MG/ML IJ SOLN
INTRAMUSCULAR | Status: AC
  Filled 2014-03-09: qty 3

## 2014-03-09 MED ORDER — ARTIFICIAL TEARS OP OINT
TOPICAL_OINTMENT | OPHTHALMIC | Status: AC
  Filled 2014-03-09: qty 3.5

## 2014-03-09 MED ORDER — ONDANSETRON HCL 4 MG/2ML IJ SOLN: 4.0000 mg | Freq: Once | INTRAMUSCULAR | Status: DC | PRN

## 2014-03-09 MED ORDER — FENTANYL CITRATE 0.05 MG/ML IJ SOLN: 25.0000 ug | INTRAMUSCULAR | Status: DC | PRN

## 2014-03-09 MED ORDER — CHLORHEXIDINE GLUCONATE 4 % EX LIQD: 60.0000 mL | Freq: Once | CUTANEOUS | Status: DC

## 2014-03-09 MED ORDER — FENTANYL CITRATE 0.05 MG/ML IJ SOLN
INTRAMUSCULAR | Status: AC
  Filled 2014-03-09: qty 5

## 2014-03-09 MED ORDER — SUCCINYLCHOLINE CHLORIDE 20 MG/ML IJ SOLN
INTRAMUSCULAR | Status: DC | PRN
  Administered 2014-03-09: 100 mg via INTRAVENOUS

## 2014-03-09 MED ORDER — LIDOCAINE HCL (CARDIAC) 10 MG/ML IV SOLN
INTRAVENOUS | Status: DC | PRN
  Administered 2014-03-09: 50 mg via INTRAVENOUS

## 2014-03-09 MED ORDER — LACTATED RINGERS IV SOLN
INTRAVENOUS | Status: DC
  Administered 2014-03-09: 08:00:00 via INTRAVENOUS

## 2014-03-09 MED ORDER — KETOROLAC TROMETHAMINE 30 MG/ML IJ SOLN
30.0000 mg | Freq: Once | INTRAMUSCULAR | Status: AC
  Administered 2014-03-09: 30 mg via INTRAVENOUS

## 2014-03-09 MED ORDER — NEOSTIGMINE METHYLSULFATE 10 MG/10ML IV SOLN
INTRAVENOUS | Status: AC
  Filled 2014-03-09: qty 3

## 2014-03-09 MED ORDER — CEFAZOLIN SODIUM-DEXTROSE 2-3 GM-% IV SOLR
INTRAVENOUS | Status: AC
  Filled 2014-03-09: qty 50

## 2014-03-09 MED ORDER — LACTATED RINGERS IV SOLN
INTRAVENOUS | Status: DC | PRN
  Administered 2014-03-09 (×2): via INTRAVENOUS

## 2014-03-09 MED ORDER — DEXAMETHASONE SODIUM PHOSPHATE 4 MG/ML IJ SOLN
4.0000 mg | Freq: Once | INTRAMUSCULAR | Status: AC
  Administered 2014-03-09: 4 mg via INTRAVENOUS

## 2014-03-09 MED ORDER — SODIUM CHLORIDE 0.9 % IR SOLN
Status: DC | PRN
  Administered 2014-03-09: 1000 mL

## 2014-03-09 MED ORDER — FENTANYL CITRATE 0.05 MG/ML IJ SOLN
INTRAMUSCULAR | Status: DC | PRN
  Administered 2014-03-09 (×2): 50 ug via INTRAVENOUS

## 2014-03-09 MED ORDER — PROPOFOL 10 MG/ML IV BOLUS
INTRAVENOUS | Status: AC
  Filled 2014-03-09: qty 20

## 2014-03-09 MED ORDER — HYDROCODONE-ACETAMINOPHEN 5-325 MG PO TABS
ORAL_TABLET | ORAL | Status: AC
  Filled 2014-03-09: qty 2

## 2014-03-09 MED ORDER — ONDANSETRON HCL 4 MG/2ML IJ SOLN
4.0000 mg | Freq: Once | INTRAMUSCULAR | Status: AC
  Administered 2014-03-09: 4 mg via INTRAVENOUS

## 2014-03-09 MED ORDER — CEFAZOLIN SODIUM-DEXTROSE 2-3 GM-% IV SOLR
2.0000 g | INTRAVENOUS | Status: AC
  Administered 2014-03-09: 2 g via INTRAVENOUS

## 2014-03-09 MED ORDER — MIDAZOLAM HCL 2 MG/2ML IJ SOLN
1.0000 mg | INTRAMUSCULAR | Status: DC | PRN
  Administered 2014-03-09: 2 mg via INTRAVENOUS

## 2014-03-09 MED ORDER — LIDOCAINE HCL (PF) 1 % IJ SOLN
INTRAMUSCULAR | Status: AC
  Filled 2014-03-09: qty 5

## 2014-03-09 MED ORDER — GLYCOPYRROLATE 0.2 MG/ML IJ SOLN
INTRAMUSCULAR | Status: DC | PRN
  Administered 2014-03-09: 0.6 mg via INTRAVENOUS

## 2014-03-09 MED ORDER — SODIUM CHLORIDE 0.9 % IR SOLN
Status: DC | PRN
  Administered 2014-03-09 (×2): 3000 mL

## 2014-03-09 MED ORDER — PROPOFOL 10 MG/ML IV BOLUS
INTRAVENOUS | Status: DC | PRN
  Administered 2014-03-09: 150 mg via INTRAVENOUS

## 2014-03-09 MED ORDER — ROCURONIUM BROMIDE 50 MG/5ML IV SOLN
INTRAVENOUS | Status: AC
Start: 2014-03-09 — End: 2014-03-09
  Filled 2014-03-09: qty 1

## 2014-03-09 MED ORDER — HYDROCODONE-ACETAMINOPHEN 5-325 MG PO TABS
2.0000 | ORAL_TABLET | Freq: Once | ORAL | Status: AC
  Administered 2014-03-09: 2 via ORAL

## 2014-03-09 MED ORDER — SUCCINYLCHOLINE CHLORIDE 20 MG/ML IJ SOLN
INTRAMUSCULAR | Status: AC
  Filled 2014-03-09: qty 1

## 2014-03-09 MED ORDER — ONDANSETRON HCL 4 MG/2ML IJ SOLN: 4.0000 mg | Freq: Once | INTRAMUSCULAR | Status: DC

## 2014-03-09 MED ORDER — ONDANSETRON HCL 4 MG/2ML IJ SOLN
INTRAMUSCULAR | Status: AC
  Filled 2014-03-09: qty 2

## 2014-03-09 MED ORDER — BUPIVACAINE-EPINEPHRINE (PF) 0.5% -1:200000 IJ SOLN
INTRAMUSCULAR | Status: AC
  Filled 2014-03-09: qty 60

## 2014-03-09 MED ORDER — NEOSTIGMINE METHYLSULFATE 10 MG/10ML IV SOLN
INTRAVENOUS | Status: DC | PRN
  Administered 2014-03-09: 4 mg via INTRAVENOUS

## 2014-03-09 MED ORDER — KETOROLAC TROMETHAMINE 30 MG/ML IJ SOLN
INTRAMUSCULAR | Status: AC
  Filled 2014-03-09: qty 1

## 2014-03-09 MED ORDER — SODIUM CHLORIDE 0.9 % IJ SOLN
INTRAMUSCULAR | Status: AC
  Filled 2014-03-09: qty 10

## 2014-03-09 MED ORDER — PROMETHAZINE HCL 12.5 MG PO TABS: 12.5000 mg | ORAL_TABLET | Freq: Four times a day (QID) | ORAL | Status: DC | PRN

## 2014-03-09 MED ORDER — HYDROCODONE-ACETAMINOPHEN 10-325 MG PO TABS: 1.0000 | ORAL_TABLET | ORAL | Status: DC | PRN

## 2014-03-09 MED ORDER — DEXAMETHASONE SODIUM PHOSPHATE 4 MG/ML IJ SOLN
INTRAMUSCULAR | Status: AC
  Filled 2014-03-09: qty 1

## 2014-03-09 MED ORDER — EPHEDRINE SULFATE 50 MG/ML IJ SOLN
INTRAMUSCULAR | Status: AC
  Filled 2014-03-09: qty 1

## 2014-03-09 MED ORDER — MIDAZOLAM HCL 2 MG/2ML IJ SOLN
INTRAMUSCULAR | Status: AC
  Filled 2014-03-09: qty 2

## 2014-03-09 MED ORDER — EPINEPHRINE HCL 1 MG/ML IJ SOLN
INTRAMUSCULAR | Status: AC
  Filled 2014-03-09: qty 5

## 2014-03-09 MED ORDER — BUPIVACAINE-EPINEPHRINE (PF) 0.5% -1:200000 IJ SOLN
INTRAMUSCULAR | Status: DC | PRN
  Administered 2014-03-09: 60 mL

## 2014-03-09 MED ORDER — SEVOFLURANE IN SOLN
RESPIRATORY_TRACT | Status: AC
  Filled 2014-03-09: qty 250

## 2014-03-09 MED ORDER — ROCURONIUM BROMIDE 100 MG/10ML IV SOLN
INTRAVENOUS | Status: DC | PRN
  Administered 2014-03-09: 15 mg via INTRAVENOUS

## 2014-03-09 SURGICAL SUPPLY — 41 items
BAG HAMPER (MISCELLANEOUS) ×4 IMPLANT
BANDAGE ELASTIC 6 VELCRO NS (GAUZE/BANDAGES/DRESSINGS) ×4 IMPLANT
BLADE AGGRESSIVE PLUS 4.0 (BLADE) ×4 IMPLANT
BLADE SURG SZ11 CARB STEEL (BLADE) ×4 IMPLANT
CHLORAPREP W/TINT 26ML (MISCELLANEOUS) ×4 IMPLANT
CLOTH BEACON ORANGE TIMEOUT ST (SAFETY) ×4 IMPLANT
COVER PROBE W GEL 5X96 (DRAPES) ×4 IMPLANT
CUFF TOURNIQUET SINGLE 34IN LL (TOURNIQUET CUFF) ×4 IMPLANT
DECANTER SPIKE VIAL GLASS SM (MISCELLANEOUS) ×8 IMPLANT
GAUZE SPONGE 4X4 12PLY STRL (GAUZE/BANDAGES/DRESSINGS) ×4 IMPLANT
GAUZE SPONGE 4X4 16PLY XRAY LF (GAUZE/BANDAGES/DRESSINGS) ×4 IMPLANT
GAUZE XEROFORM 5X9 LF (GAUZE/BANDAGES/DRESSINGS) ×4 IMPLANT
GLOVE BIOGEL M 7.0 STRL (GLOVE) ×4 IMPLANT
GLOVE BIOGEL PI IND STRL 7.0 (GLOVE) ×4 IMPLANT
GLOVE BIOGEL PI INDICATOR 7.0 (GLOVE) ×4
GLOVE SKINSENSE NS SZ8.0 LF (GLOVE) ×2
GLOVE SKINSENSE STRL SZ8.0 LF (GLOVE) ×2 IMPLANT
GLOVE SS N UNI LF 8.5 STRL (GLOVE) ×4 IMPLANT
GOWN STRL REUS W/TWL LRG LVL3 (GOWN DISPOSABLE) ×4 IMPLANT
GOWN STRL REUS W/TWL XL LVL3 (GOWN DISPOSABLE) ×4 IMPLANT
HLDR LEG FOAM (MISCELLANEOUS) ×2 IMPLANT
IV NS IRRIG 3000ML ARTHROMATIC (IV SOLUTION) ×8 IMPLANT
KIT BLADEGUARD II DBL (SET/KITS/TRAYS/PACK) ×4 IMPLANT
KIT ROOM TURNOVER AP CYSTO (KITS) ×4 IMPLANT
LEG HOLDER FOAM (MISCELLANEOUS) ×2
MANIFOLD NEPTUNE II (INSTRUMENTS) ×4 IMPLANT
MARKER SKIN DUAL TIP RULER LAB (MISCELLANEOUS) ×4 IMPLANT
NEEDLE HYPO 18GX1.5 BLUNT FILL (NEEDLE) ×4 IMPLANT
NEEDLE HYPO 21X1.5 SAFETY (NEEDLE) ×4 IMPLANT
NEEDLE SPNL 18GX3.5 QUINCKE PK (NEEDLE) ×4 IMPLANT
NS IRRIG 1000ML POUR BTL (IV SOLUTION) ×4 IMPLANT
PACK ARTHRO LIMB DRAPE STRL (MISCELLANEOUS) ×4 IMPLANT
PAD ABD 5X9 TENDERSORB (GAUZE/BANDAGES/DRESSINGS) ×4 IMPLANT
PAD ARMBOARD 7.5X6 YLW CONV (MISCELLANEOUS) ×4 IMPLANT
PADDING CAST COTTON 6X4 STRL (CAST SUPPLIES) ×4 IMPLANT
SET ARTHROSCOPY INST (INSTRUMENTS) ×4 IMPLANT
SET ARTHROSCOPY PUMP TUBE (IRRIGATION / IRRIGATOR) ×4 IMPLANT
SET BASIN LINEN APH (SET/KITS/TRAYS/PACK) ×4 IMPLANT
SUT ETHILON 3 0 FSL (SUTURE) ×4 IMPLANT
SYR 30ML LL (SYRINGE) ×4 IMPLANT
YANKAUER SUCT BULB TIP 10FT TU (MISCELLANEOUS) ×12 IMPLANT

## 2014-03-09 NOTE — Telephone Encounter (Signed)
Alyssa Poole IS ASKING FOR A WALKER INSTEAD OF CRUTCHES BE CALLED TO [facility], PLEASE ADVISE?

## 2014-03-09 NOTE — Interval H&P Note (Signed)
History and Physical Interval Note:  03/09/2014 8:23 AM  Alyssa RuskBobbie W Benton  has presented today for surgery, with the diagnosis of left knee arthritis and loose body  The various methods of treatment have been discussed with the patient and family. After consideration of risks, benefits and other options for treatment, the patient has consented to  Procedure(s) with comments: ARTHROSCOPY KNEE (Left) - with loose body removal as a surgical intervention .  The patient's history has been reviewed, patient examined, no change in status, stable for surgery.  I have reviewed the patient's chart and labs.  Questions were answered to the patient's satisfaction.     Fuller CanadaStanley Ponce Skillman

## 2014-03-09 NOTE — Transfer of Care (Signed)
Immediate Anesthesia Transfer of Care Note  Patient: Alyssa Poole  Procedure(s) Performed: Procedure(s): LEFT KNEE ARTHROSCOPY WITH PARTIAL MEDIAL MENISECTOMY (Left)  Patient Location: PACU  Anesthesia Type:General  Level of Consciousness: sedated and patient cooperative  Airway & Oxygen Therapy: Patient Spontanous Breathing and Patient connected to face mask oxygen  Post-op Assessment: Report given to PACU RN and Post -op Vital signs reviewed and stable  Post vital signs: Reviewed and stable  Complications: No apparent anesthesia complications

## 2014-03-09 NOTE — Discharge Instructions (Signed)
Arthroscopic Procedure, Knee °An arthroscopic procedure can find what is wrong with your knee. °PROCEDURE °Arthroscopy is a surgical technique that allows your orthopedic surgeon to diagnose and treat your knee injury with accuracy. They will look into your knee through a small instrument. This is almost like a small (pencil sized) telescope. Because arthroscopy affects your knee less than open knee surgery, you can anticipate a more rapid recovery. Taking an active role by following your caregiver's instructions will help with rapid and complete recovery. Use crutches, rest, elevation, ice, and knee exercises as instructed. The length of recovery depends on various factors including type of injury, age, physical condition, medical conditions, and your rehabilitation. °Your knee is the joint between the large bones (femur and tibia) in your leg. Cartilage covers these bone ends which are smooth and slippery and allow your knee to bend and move smoothly. Two menisci, thick, semi-lunar shaped pads of cartilage which form a rim inside the joint, help absorb shock and stabilize your knee. Ligaments bind the bones together and support your knee joint. Muscles move the joint, help support your knee, and take stress off the joint itself. Because of this all programs and physical therapy to rehabilitate an injured or repaired knee require rebuilding and strengthening your muscles. °AFTER THE PROCEDURE °· After the procedure, you will be moved to a recovery area until most of the effects of the medication have worn off. Your caregiver will discuss the test results with you. °· Only take over-the-counter or prescription medicines for pain, discomfort, or fever as directed by your caregiver. °SEEK MEDICAL CARE IF:  °· You have increased bleeding from your wounds. °· You see redness, swelling, or have increasing pain in your wounds. °· You have pus coming from your wound. °· You have an oral temperature above 102° F (38.9°  C). °· You notice a bad smell coming from the wound or dressing. °· You have severe pain with any motion of your knee. °SEEK IMMEDIATE MEDICAL CARE IF:  °· You develop a rash. °· You have difficulty breathing. °· You have any allergic problems. °Document Released: 03/15/2000 Document Revised: 06/10/2011 Document Reviewed: 10/07/2007 °ExitCare® Patient Information ©2015 [employer], [employer]. This information is not intended to replace advice given to you by your health care provider. Make sure you discuss any questions you have with your health care provider. ° °

## 2014-03-09 NOTE — Anesthesia Preprocedure Evaluation (Signed)
Anesthesia Evaluation  Patient identified by MRN, date of birth, ID band Patient awake    Reviewed: Allergy & Precautions, H&P , NPO status   History of Anesthesia Complications Negative for: history of anesthetic complications  Airway Mallampati: II  TM Distance: >3 FB     Dental  (+) Partial Upper, Partial Lower, Poor Dentition, Dental Advisory Given   Pulmonary COPDformer smoker,  breath sounds clear to auscultation        Cardiovascular hypertension, Pt. on medications Rhythm:Regular Rate:Normal     Neuro/Psych PSYCHIATRIC DISORDERS Anxiety  Neuromuscular disease    GI/Hepatic GERD-  Medicated,  Endo/Other    Renal/GU      Musculoskeletal  (+) Arthritis -,   Abdominal   Peds  Hematology   Anesthesia Other Findings   Reproductive/Obstetrics                             Anesthesia Physical Anesthesia Plan  ASA: III  Anesthesia Plan: General   Post-op Pain Management:    Induction: Intravenous, Rapid sequence and Cricoid pressure planned  Airway Management Planned: Oral ETT  Additional Equipment:   Intra-op Plan:   Post-operative Plan: Extubation in OR  Informed Consent: I have reviewed the patients History and Physical, chart, labs and discussed the procedure including the risks, benefits and alternatives for the proposed anesthesia with the patient or authorized representative who has indicated his/her understanding and acceptance.     Plan Discussed with:   Anesthesia Plan Comments:         Anesthesia Quick Evaluation

## 2014-03-09 NOTE — Anesthesia Procedure Notes (Signed)
Procedure Name: Intubation Date/Time: 03/09/2014 8:35 AM Performed by: Pernell DupreADAMS, AMY A Pre-anesthesia Checklist: Patient identified, Patient being monitored, Timeout performed, Emergency Drugs available and Suction available Patient Re-evaluated:Patient Re-evaluated prior to inductionOxygen Delivery Method: Circle System Utilized Preoxygenation: Pre-oxygenation with 100% oxygen Intubation Type: IV induction, Rapid sequence and Cricoid Pressure applied Laryngoscope Size: 3 and Miller Grade View: Grade I Tube type: Oral Tube size: 7.0 mm Number of attempts: 1 Airway Equipment and Method: stylet Placement Confirmation: ETT inserted through vocal cords under direct vision,  positive ETCO2 and breath sounds checked- equal and bilateral Secured at: 21 cm Tube secured with: Tape Dental Injury: Teeth and Oropharynx as per pre-operative assessment

## 2014-03-09 NOTE — Brief Op Note (Addendum)
03/09/2014  9:23 AM  PATIENT:  Alyssa Poole  53 y.o. female   MRI findings   IMPRESSION: 1. Moderate medial compartment degenerative changes with suspected small intra-articular loose bodies. 2. Mild patellar chondromalacia. 3. No acute osteochondral or ligamentous findings. Both menisci appear intact.   PRE-OPERATIVE DIAGNOSIS:  left knee arthritis and loose body  POST-OPERATIVE DIAGNOSIS:  Torn medial meniscus left knee with osteoarthritis medial femoral condyle.  Operative findings posterior horn tear medial meniscus grade 2 chondromalacia medial femoral condyle abnormal attachment of the anterior cruciate ligament but no evidence of acute tear all of the compartments were normal  Procedure arthroscopy partial medial meniscectomy and chondroplasty medial femoral condyle  Details of procedure Knee arthroscopy dictation  The patient was identified in the preoperative holding area using 2 approved identification mechanisms. The chart was reviewed and updated. The surgical site was confirmed and marked with an indelible marker. (Left knee)  The patient was taken to the operating room for anesthesia. After successful  general anesthesia, 2 g Ancef  was used as IV antibiotics.  The patient was placed in the supine position with the operative extremity , left knee,  in an arthroscopic leg holder and the opposite extremity in a padded leg holder.  The timeout was executed.  A lateral portal was established with an 11 blade and the scope was introduced into the joint. A diagnostic arthroscopy was performed. A medial portal was established and the diagnostic arthroscopy was repeated using a probe to palpate intra-articular structures as they were encountered. The knee was evaluated circumferentially starting in the medial compartment and ending in the medial compartment.  The operative findings are noted above.  The meniscus was resected using a duckbill forceps. The meniscal  fragments were removed with a motorized shaver. The meniscus was balanced with a motorized shaver and stability was confirmed with a probe.  The scope was then placed in the medial portal and driven through the notch to evaluate the posterior compartment. The pump was placed in wash mode to wash out any loose bodies although no loose bodies were found.  The arthroscopic pump was placed on the wash mode and any excess debris was removed from the joint using suction.  60 cc of Marcaine with epinephrine was injected to the arthroscope.  The portals were closed with 3-0 nylon suture.  A sterile bandage, Ace wrap and Cryo/Cuff was placed and a Cryo/Cuff was activated. The patient was taken to the recovery room in stable condition.    PROCEDURE:  Procedure(s): LEFT KNEE ARTHROSCOPY (Left)  SURGEON:  Surgeon(s) and Role:    * Vickki HearingStanley E Maham Quintin, MD - Primary  PHYSICIAN ASSISTANT:   ASSISTANTS: none   ANESTHESIA:   general  EBL:  Total I/O In: 700 [I.V.:700] Out: 10 [Blood:10]  BLOOD ADMINISTERED:none  DRAINS: none   LOCAL MEDICATIONS USED:  MARCAINE     SPECIMEN:  No Specimen  DISPOSITION OF SPECIMEN:  N/A  COUNTS:  YES  TOURNIQUET:    DICTATION: .Dragon Dictation  PLAN OF CARE: Discharge to home after PACU  PATIENT DISPOSITION:  PACU - hemodynamically stable.   Delay start of Pharmacological VTE agent (>24hrs) due to surgical blood loss or risk of bleeding: not applicable

## 2014-03-09 NOTE — Anesthesia Postprocedure Evaluation (Signed)
  Anesthesia Post-op Note  Patient: Alyssa AlmBobbie W Poole  Procedure(s) Performed: Procedure(s): LEFT KNEE ARTHROSCOPY WITH PARTIAL MEDIAL MENISECTOMY (Left)  Patient Location: PACU  Anesthesia Type:General  Level of Consciousness: awake, alert , oriented and patient cooperative  Airway and Oxygen Therapy: Patient Spontanous Breathing and Patient connected to face mask oxygen  Post-op Pain: mild  Post-op Assessment: Post-op Vital signs reviewed, Patient's Cardiovascular Status Stable, Respiratory Function Stable, Patent Airway, No signs of Nausea or vomiting and Pain level controlled  Post-op Vital Signs: Reviewed and stable  Last Vitals:  Filed Vitals:   03/09/14 0945  BP: 142/71  Pulse: 58  Temp:   Resp: 19    Complications: No apparent anesthesia complications

## 2014-03-09 NOTE — Op Note (Addendum)
03/09/2014  9:23 AM  PATIENT:  Alyssa Poole  53 y.o. female   MRI findings   IMPRESSION: 1. Moderate medial compartment degenerative changes with suspected small intra-articular loose bodies. 2. Mild patellar chondromalacia. 3. No acute osteochondral or ligamentous findings. Both menisci appear intact.   PRE-OPERATIVE DIAGNOSIS:  left knee arthritis and loose body  POST-OPERATIVE DIAGNOSIS:  Torn medial meniscus left knee with osteoarthritis medial femoral condyle.  Operative findings posterior horn tear medial meniscus grade 2 chondromalacia medial femoral condyle abnormal attachment of the anterior cruciate ligament but no evidence of acute tear all of the compartments were normal  Procedure arthroscopy left knee partial medial meniscectomy and chondroplasty medial femoral condyle (however the meniscectomy was the primary portion of the procedure)  Details of procedure Knee arthroscopy dictation  The patient was identified in the preoperative holding area using 2 approved identification mechanisms. The chart was reviewed and updated. The surgical site was confirmed and marked with an indelible marker. (Left knee)  The patient was taken to the operating room for anesthesia. After successful  general anesthesia, 2 g Ancef  was used as IV antibiotics.  The patient was placed in the supine position with the operative extremity , left knee,  in an arthroscopic leg holder and the opposite extremity in a padded leg holder.  The timeout was executed.  A lateral portal was established with an 11 blade and the scope was introduced into the joint. A diagnostic arthroscopy was performed. A medial portal was established and the diagnostic arthroscopy was repeated using a probe to palpate intra-articular structures as they were encountered. The knee was evaluated circumferentially starting in the medial compartment and ending in the medial compartment.  The operative findings are noted  above.  The meniscus was resected using a duckbill forceps. The meniscal fragments were removed with a motorized shaver. The meniscus was balanced with a motorized shaver and stability was confirmed with a probe. We then did a brief chondroplasty only resecting loose tissue from the condyle.  The scope was then placed in the medial portal and driven through the notch to evaluate the posterior compartment. The pump was placed in wash mode to wash out any loose bodies although no loose bodies were found.  The arthroscopic pump was placed on the wash mode and any excess debris was removed from the joint using suction.  60 cc of Marcaine with epinephrine was injected to the arthroscope.  The portals were closed with 3-0 nylon suture.  A sterile bandage, Ace wrap and Cryo/Cuff was placed and a Cryo/Cuff was activated. The patient was taken to the recovery room in stable condition.    PROCEDURE:  Procedure(s): LEFT KNEE ARTHROSCOPY (Left)  SURGEON:  Surgeon(s) and Role:    * Vickki HearingStanley E Dreshon Proffit, MD - Primary  PHYSICIAN ASSISTANT:   ASSISTANTS: none   ANESTHESIA:   general  EBL:  Total I/O In: 700 [I.V.:700] Out: 10 [Blood:10]  BLOOD ADMINISTERED:none  DRAINS: none   LOCAL MEDICATIONS USED:  MARCAINE     SPECIMEN:  No Specimen  DISPOSITION OF SPECIMEN:  N/A  COUNTS:  YES  TOURNIQUET:    DICTATION: .Dragon Dictation  PLAN OF CARE: Discharge to home after PACU  PATIENT DISPOSITION:  PACU - hemodynamically stable.   Delay start of Pharmacological VTE agent (>24hrs) due to surgical blood loss or risk of bleeding: not applicable

## 2014-03-10 ENCOUNTER — Encounter (HOSPITAL_COMMUNITY): Payer: Self-pay | Admitting: Orthopedic Surgery

## 2014-03-10 ENCOUNTER — Other Ambulatory Visit: Payer: Self-pay | Admitting: *Deleted

## 2014-03-10 DIAGNOSIS — M1712 Unilateral primary osteoarthritis, left knee: Secondary | ICD-10-CM

## 2014-03-10 NOTE — Telephone Encounter (Signed)
If [facility] am not in [facility]   jaime send order for walker

## 2014-03-11 ENCOUNTER — Telehealth: Payer: Self-pay | Admitting: *Deleted

## 2014-03-11 NOTE — Telephone Encounter (Signed)
Spoke with patient, she already has a walker

## 2014-03-11 NOTE — Telephone Encounter (Signed)
Patient called states minimal pain day after surgery was able to get up and ambulate but today she is having more pain and some stiffness and harder to ambulate, advised patient not to over do it, elevate knee to reduce swelling and ice, if pain becomes severe go to ER otherwise keep her post op appointment monday

## 2014-03-14 ENCOUNTER — Ambulatory Visit (INDEPENDENT_AMBULATORY_CARE_PROVIDER_SITE_OTHER): Payer: Self-pay | Admitting: Orthopedic Surgery

## 2014-03-14 ENCOUNTER — Encounter: Payer: Self-pay | Admitting: Orthopedic Surgery

## 2014-03-14 VITALS — BP 115/72 | Ht 63.0 in | Wt 148.0 lb

## 2014-03-14 DIAGNOSIS — Z9889 Other specified postprocedural states: Secondary | ICD-10-CM

## 2014-03-14 DIAGNOSIS — M129 Arthropathy, unspecified: Secondary | ICD-10-CM

## 2014-03-14 DIAGNOSIS — M23322 Other meniscus derangements, posterior horn of medial meniscus, left knee: Secondary | ICD-10-CM

## 2014-03-14 DIAGNOSIS — M1712 Unilateral primary osteoarthritis, left knee: Secondary | ICD-10-CM

## 2014-03-14 NOTE — Patient Instructions (Signed)
Start physical therapy

## 2014-03-14 NOTE — Progress Notes (Signed)
Chief Complaint  Patient presents with  . Follow-up    post op 1, SALK, DOS 03/09/14   BP 115/72 mmHg  Ht 5\' 3"  (1.6 m)  Wt 148 lb (67.132 kg)  BMI 26.22 kg/m2  Encounter Diagnoses  Name Primary?  Alyssa Poole. Arthritis of knee, left   . Medial meniscus, posterior horn derangement, left   . S/P arthroscopy of left knee Yes    Postop visit #1 knee arthroscopy left primarily medial meniscectomy  Mild swelling portals are clean  She is ambulating without assistive device slight limp  Okay to start physical therapy  She declined any refills on pain medication   Return in 3 weeks continue ice 3-4 times a day

## 2014-03-17 ENCOUNTER — Ambulatory Visit (INDEPENDENT_AMBULATORY_CARE_PROVIDER_SITE_OTHER): Payer: Self-pay | Admitting: Orthopedic Surgery

## 2014-03-17 VITALS — BP 117/60 | Ht 63.0 in | Wt 148.0 lb

## 2014-03-17 DIAGNOSIS — M129 Arthropathy, unspecified: Secondary | ICD-10-CM

## 2014-03-17 DIAGNOSIS — Z9889 Other specified postprocedural states: Secondary | ICD-10-CM

## 2014-03-17 DIAGNOSIS — M1712 Unilateral primary osteoarthritis, left knee: Secondary | ICD-10-CM

## 2014-03-17 NOTE — Progress Notes (Signed)
Patient ID: Alyssa RuskBobbie W Poole, female   DOB: 11/27/1960, 53 y.o.   MRN: [id number] Chief Complaint  Patient presents with  . Follow-up    Wound check on left knee, DOS 03-09-14.    Sanguinous drainage from the portal. Steri-Strips. Continue Band-Aid until it stops no signs of infection

## 2014-03-21 ENCOUNTER — Ambulatory Visit (HOSPITAL_COMMUNITY)
Admission: RE | Admit: 2014-03-21 | Discharge: 2014-03-21 | Disposition: A | Discharge status: Home / Self Care | Payer: Medicare Other | Source: Ambulatory Visit | Attending: Orthopedic Surgery | Admitting: Orthopedic Surgery

## 2014-03-21 DIAGNOSIS — M1710 Unilateral primary osteoarthritis, unspecified knee: Secondary | ICD-10-CM

## 2014-03-21 DIAGNOSIS — Z4789 Encounter for other orthopedic aftercare: Secondary | ICD-10-CM | POA: Insufficient documentation

## 2014-03-21 DIAGNOSIS — M25662 Stiffness of left knee, not elsewhere classified: Secondary | ICD-10-CM

## 2014-03-21 DIAGNOSIS — M25562 Pain in left knee: Secondary | ICD-10-CM | POA: Insufficient documentation

## 2014-03-21 DIAGNOSIS — R269 Unspecified abnormalities of gait and mobility: Secondary | ICD-10-CM

## 2014-03-21 DIAGNOSIS — R262 Difficulty in walking, not elsewhere classified: Secondary | ICD-10-CM | POA: Insufficient documentation

## 2014-03-21 DIAGNOSIS — R531 Weakness: Secondary | ICD-10-CM | POA: Insufficient documentation

## 2014-03-21 DIAGNOSIS — R29898 Other symptoms and signs involving the musculoskeletal system: Secondary | ICD-10-CM

## 2014-03-21 DIAGNOSIS — M25362 Other instability, left knee: Secondary | ICD-10-CM

## 2014-03-21 NOTE — Therapy (Signed)
[facility] [address] [place], [name], [address] Phone: [phone]   Fax:  [phone]  Physical Therapy Evaluation  Patient Details  Name: Alyssa Poole MRN: [id number] Date of Birth: 09/23/1960  Encounter Date: 03/21/2014      PT End of Session - 03/21/14 1017    Visit Number 1   Number of Visits 17   Date for PT Re-Evaluation 04/20/14   Authorization Type medicre   Authorization - Visit Number 1   Authorization - Number of Visits 10   PT Start Time 0935   PT Stop Time 1015   PT Time Calculation (min) 40 min   Activity Tolerance Patient tolerated treatment well      Past Medical History  Diagnosis Date  . Hypertension   . Anxiety   . Osteoporosis     Past Surgical History  Procedure Laterality Date  . Back surgery    . Cervical spine surgery    . Cholecystectomy    . Knee arthroscopy with medial menisectomy Left 03/09/2014    Procedure: LEFT KNEE ARTHROSCOPY WITH PARTIAL MEDIAL MENISECTOMY;  Surgeon: Vickki HearingStanley E Harrison, MD;  Location: [facility];  Service: Orthopedics;  Laterality: Left;    There were no vitals taken for this visit.  Visit Diagnosis:  Knee arthropathy  Knee stiffness, left  Knee buckling, left  Weakness of left leg  Abnormality of gait  Difficulty walking      Subjective Assessment - 03/21/14 0947    Symptoms Pain   Pertinent History Patient has a lonjg history of Lt knee pain over the last 7 to 8 years, only Lt knee pain. Patient had surgery to heal Medical meniscus, posterior horn derangement, Left on March 09, 2014. Patient has since had no therapy and just tried to walk on her own. Patient also has a history of disc bulgeing in her neck and back, patient has 3 back surgeries (no fusions). Patient still experiences back pain for which she used a TENS unit.    Patient Stated Goals to be balt ot squat to the floor,    Currently in Pain? Yes   Pain Score 9    Pain Location Knee    Pain Orientation Left   Pain Descriptors / Indicators Aching   Pain Type Chronic pain;Surgical pain   Pain Onset 1 to 4 weeks ago   Pain Frequency Intermittent   Aggravating Factors  cold,    Pain Relieving Factors pain medication. compression, warmth.    Effect of Pain on Daily Activities sqattig to the floor,           [facility] - 03/21/14 0001    Assessment   Medical Diagnosis Lt knee pain, weakness and stiffness s/p nee arthroscopic surgery for medial meniscectomy.    Onset Date 03/09/14   Next MD Visit Romeo AppleHarrison, 04/04/14   Prior Therapy no   Restrictions   Weight Bearing Restrictions No   Balance Screen   Has the patient fallen in the past 6 months No   Has the patient had a decrease in activity level because of a fear of falling?  No   Is the patient reluctant to leave their home because of a fear of falling?  No   Prior Function   Level of Independence Independent with basic ADLs   Cognition   Overall Cognitive Status Within Functional Limits for tasks assessed   Observation/Other Assessments   Focus on Therapeutic Outcomes (FOTO)  77% limited  Functional Tests   Functional tests Sit to Stand;Other;Other2   Other:   Other/ Comments gait: excessive toe out bilateral, Lt trendelenberg gait, decreased stride length and occasional Lt knee buckling.    AROM   Right Hip Flexion 125   Right Hip External Rotation  55   Right Hip Internal Rotation  22   Left Hip Flexion 125   Left Hip External Rotation  55   Left Hip Internal Rotation  22   Left Knee Extension -8   Left Knee Flexion 75   Strength   Right Hip Flexion --  4+/5   Right Hip Extension 2+/5   Right Hip ABduction 2+/5   Left Hip Flexion --  4+/5   Left Hip Extension 2+/5   Left Hip ABduction 2-/5   Left Knee Flexion 2-/5   Left Knee Extension --  4+/5   Flexibility   Soft Tissue Assessment /Muscle Lenght yes   Hamstrings limited   Quadriceps limited   Piriformis limited             [facility] Adult PT Treatment/Exercise - 03/21/14 0001    Exercises   Exercises Knee/Hip   Knee/Hip Exercises: Supine   Short Arc Quad Sets 2 sets;10 reps;AROM   Heel Slides 10 reps;AROM;Left             PT Education - 03/21/14 1017    Education provided Yes   Education Details HEP, see patient instructions, heel slides and short arc quadriceps   Person(s) Educated Patient   Methods Explanation;Demonstration;Handout   Comprehension Verbalized understanding;Returned demonstration          PT Short Term Goals - 03/21/14 1041    PT SHORT TERM GOAL #1   Title Patient will demosntrate full knee extension to decrease limping and normalize stride length   Baseline -8   Time 4   Period Weeks   Status New   PT SHORT TERM GOAL #2   Title Patient will demosntrate increased knee flexion to >100 degrees to be able to sit to normal height chair without UE asistance.    Baseline <80 degrees   Time 4   Period Weeks   Status New   PT SHORT TERM GOAL #3   Title patient will demonstrate increased glut med/max strength of 3/5 MMT bilaterally to be able to sit to stand withtou UE assistance 5x in <15 seconds.    Baseline 2-/5   Time 4   Period Weeks   Status New   PT SHORT TERM GOAL #4   Title Patient will dmeosntrate increased hamstring strength of 4-/5 MMT to be able to increased knee stability durign gait and prevent buckling.    Baseline 2+/5    Time 4   Period Weeks   Status New   PT SHORT TERM GOAL #5   Title Patient will state independence with HEP.    Time 4   Period Weeks           PT Long Term Goals - 03/21/14 1216    PT LONG TERM GOAL #1   Title Patient will demosntrate increased knee flexion to >120 degrees to be able to sit to normal height chair without UE asistance.    Time 8   Period Weeks   Status New   PT LONG TERM GOAL #2   Title patient will demonstrate increased glut med/max strength of 4/5 MMT bilaterally to be able to sit to stand withtou UE assistance  5x in <15 seconds.  Time 8   Period Weeks   Status New   PT LONG TERM GOAL #3   Title Patient will dmeosntrate increased hamstring strength of 5/5 MMT to be able to increased knee stability durign gait and prevent buckling.    Time 8   Period Weeks   Status New   PT LONG TERM GOAL #4   Title Patient will be able to walk withotu knee pain >13minutes.    Time 8   Period Weeks   Status New           Plan - 04/19/2014 1030    Clinical Impression Statement Patient displasy Lt knee weakness and stiffness as well as bilateral hip weakness and stiffness resulting in abnormal gait and continued knee pain sp knee arthoroscopic surgery for a medial meniscectomy. Patient was educated on the improtance of proper knee mechanics and exercises to perform for HEP. patient dmeosntrated goo performance of exercises. Patient will benefit from continued physical therapy to increase knee flexion and extension, decrease pain and increase LE strength so patient can return to performing all of her normal every day ADLs and IADL's withotu pain.    Pt will benefit from skilled therapeutic intervention in order to improve on the following deficits Abnormal gait;Decreased strength;Pain;Difficulty walking;Decreased mobility;Decreased activity tolerance;Decreased range of motion;Decreased balance;Increased fascial restricitons;Improper body mechanics;Decreased endurance;Impaired flexibility   Rehab Potential Good   PT Frequency 2x / week   PT Duration 8 weeks   PT Treatment/Interventions Therapeutic exercise;Balance training;Passive range of motion;Neuromuscular re-education;Patient/family education;Gait training;Stair training;Manual techniques;Functional mobility training;Therapeutic activities   PT Next Visit Plan Introduce standing LE stretches and review HEP.    PT Home Exercise Plan short arc quad, heel slides, bridges, calf raises, side laying hip abduction, prone quadriceps stretch.           G-Codes -  04-19-2014 1222    Functional Assessment Tool Used FOTO 77% limited   Functional Limitation Mobility: Walking and moving around   Mobility: Walking and Moving Around Current Status [phone]) At least 60 percent but less than 80 percent impaired, limited or restricted   Mobility: Walking and Moving Around Goal Status [phone]) At least 40 percent but less than 60 percent impaired, limited or restricted       Problem List Patient Active Problem List   Diagnosis Date Noted  . Knee contusion 12/27/2013  . Medial meniscus, posterior horn derangement 12/27/2013  . Arthritis of knee, left 12/27/2013  . Left knee pain 12/27/2013  . 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 KNEE PAIN 05/11/2007  . DISC DEGENERATION 05/11/2007  . SCIATICA 05/11/2007   Jerilee Field PT DPT [phone]  [facility] [address] [place], [place], [address] Phone: [phone]   Fax:  [phone]

## 2014-03-21 NOTE — Patient Instructions (Signed)
  Copyright  VHI. All rights reserved.  Flexion: Stretch - Quadriceps (Prone)  Patient should feel stretch along front of thigh. There should be no pain in knee joint. Hold 20 seconds. Repeat 4 times. Repeat with other leg. Do 3 sessions per day.   Bridge   Lie back, legs bent. Inhale, pressing hips up. Keeping ribs in, lengthen lower back. Exhale, rolling down along spine from top. Repeat 10 times. Do 3 sessions per day.  Copyright  VHI. All rights reserved.  Abduction   Lift leg up toward ceiling. Return Repeat 10 times each leg. Do 3 sessions per day.  http://[email or web address].exer.us/386   Copyright  VHI. All rights reserved.  Heel Raises   Stand with support. Tighten pelvic floor and hold. With knees straight, raise heels off ground. Hold 1 seconds. Repeat 10 times. Do 3 times a day.

## 2014-03-22 ENCOUNTER — Ambulatory Visit (HOSPITAL_COMMUNITY): Payer: Medicare Other

## 2014-03-23 ENCOUNTER — Ambulatory Visit (HOSPITAL_COMMUNITY)
Admission: RE | Admit: 2014-03-23 | Discharge: 2014-03-23 | Disposition: A | Discharge status: Home / Self Care | Payer: Medicare Other | Source: Ambulatory Visit | Attending: Family Medicine | Admitting: Family Medicine

## 2014-03-23 ENCOUNTER — Encounter (HOSPITAL_COMMUNITY): Payer: Self-pay

## 2014-03-23 DIAGNOSIS — R269 Unspecified abnormalities of gait and mobility: Secondary | ICD-10-CM

## 2014-03-23 DIAGNOSIS — R262 Difficulty in walking, not elsewhere classified: Secondary | ICD-10-CM

## 2014-03-23 DIAGNOSIS — M25662 Stiffness of left knee, not elsewhere classified: Secondary | ICD-10-CM

## 2014-03-23 DIAGNOSIS — Z4789 Encounter for other orthopedic aftercare: Secondary | ICD-10-CM | POA: Diagnosis not present

## 2014-03-23 DIAGNOSIS — M25362 Other instability, left knee: Secondary | ICD-10-CM

## 2014-03-23 DIAGNOSIS — R29898 Other symptoms and signs involving the musculoskeletal system: Secondary | ICD-10-CM

## 2014-03-23 DIAGNOSIS — M1710 Unilateral primary osteoarthritis, unspecified knee: Secondary | ICD-10-CM

## 2014-03-23 NOTE — Therapy (Signed)
[facility] [address] [place], [name], [address] Phone: [phone]   Fax:  [phone]  Physical Therapy Treatment  Patient Details  Name: Alyssa Poole MRN: [id number] Date of Birth: [birth year]  Encounter Date: 03/23/2014      PT End of Session - 03/23/14 1010    Visit Number 2   Number of Visits 17   Date for PT Re-Evaluation 04/20/14   Authorization - Visit Number 2   Authorization - Number of Visits 10   PT Start Time [phone]   PT Stop Time 1020   PT Time Calculation (min) 38 min   Activity Tolerance Patient tolerated treatment well   Behavior During Therapy [facility] for tasks assessed/performed      Past Medical History  Diagnosis Date  . Hypertension   . Anxiety   . Osteoporosis     Past Surgical History  Procedure Laterality Date  . Back surgery    . Cervical spine surgery    . Cholecystectomy    . Knee arthroscopy with medial menisectomy Left 03/09/2014    Procedure: LEFT KNEE ARTHROSCOPY WITH PARTIAL MEDIAL MENISECTOMY;  Surgeon: Vickki HearingStanley E Harrison, MD;  Location: [facility];  Service: Orthopedics;  Laterality: Left;    There were no vitals taken for this visit.  Visit Diagnosis:  Knee arthropathy  Knee stiffness, left  Knee buckling, left  Weakness of left leg  Abnormality of gait  Difficulty walking      Subjective Assessment - 03/23/14 0947    Symptoms Pain scale 5/10 today, knee feels really tight today.  Compliant with HEP daily   Pain Score 5    Pain Location Knee   Pain Orientation Left   Pain Descriptors / Indicators Sore;Aching          [facility] - 03/23/14 0947    Assessment   Medical Diagnosis Lt knee pain, weakness and stiffness s/p nee arthroscopic surgery for medial meniscectomy.    Onset Date 03/09/14   Next MD Visit Romeo AppleHarrison, 04/04/14   Prior Therapy no   AROM   Left Knee Extension -3   Left Knee Flexion 115                  [facility] Adult PT  Treatment/Exercise - 03/23/14 1007    Exercises   Exercises Knee/Hip   Knee/Hip Exercises: Stretches   Active Hamstring Stretch 3 reps;30 seconds;Limitations   Active Hamstring Stretch Limitations supine with rope   Quad Stretch 3 reps;30 seconds   Quad Stretch Limitations prone with rope   Gastroc Stretch 3 reps;30 seconds   Gastroc Stretch Limitations slant board   Knee/Hip Exercises: Standing   Heel Raises 15 reps   Heel Raises Limitations Toe raises 15x   Functional Squat 10 reps   Functional Squat Limitations 3D hip excursion   Knee/Hip Exercises: Supine   Quad Sets Left;10 reps   Short Arc Quad Sets 15 reps   Heel Slides 10 reps   Bridges 10 reps   Knee/Hip Exercises: Sidelying   Hip ABduction 10 reps             PT Short Term Goals - 03/23/14 1017    PT SHORT TERM GOAL #1   Title Patient will demosntrate full knee extension to decrease limping and normalize stride length   PT SHORT TERM GOAL #2   Title Patient will demosntrate increased knee flexion to >100 degrees to be able to sit to normal height chair without  UE asistance.    PT SHORT TERM GOAL #3   Title patient will demonstrate increased glut med/max strength of 3/5 MMT bilaterally to be able to sit to stand withtou UE assistance 5x in <15 seconds.    PT SHORT TERM GOAL #4   Title Patient will dmeosntrate increased hamstring strength of 4-/5 MMT to be able to increased knee stability durign gait and prevent buckling.    PT SHORT TERM GOAL #5   Title Patient will state independence with HEP.            PT Long Term Goals - 03/23/14 1018    PT LONG TERM GOAL #1   Title Patient will demosntrate increased knee flexion to >120 degrees to be able to sit to normal height chair without UE asistance.    PT LONG TERM GOAL #2   Title patient will demonstrate increased glut med/max strength of 4/5 MMT bilaterally to be able to sit to stand withtou UE assistance 5x in <15 seconds.    PT LONG TERM GOAL #3   Title  Patient will dmeosntrate increased hamstring strength of 5/5 MMT to be able to increased knee stability durign gait and prevent buckling.    PT LONG TERM GOAL #4   Title Patient will be able to walk withotu knee pain >7130minutes.             Plan - 03/23/14 1010    Clinical Impression Statement Pt 12' late for apt today, reviewed HEP exercises with ability to demonstrate appropraite techniques with all exercises with min cueing.  Added stretches to improve flexibilty and 3D hip excursion for hip mobilty.  Pt making great improvements with overall knee AROM to 3--115.   PT Next Visit Plan Continue stretches for flexibilty, quad strengthening exercises and progress squats and stair training.          Problem List Patient Active Problem List   Diagnosis Date Noted  . Knee contusion 12/27/2013  . Medial meniscus, posterior horn derangement 12/27/2013  . Arthritis of knee, left 12/27/2013  . Left knee pain 12/27/2013  . 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 KNEE PAIN 05/11/2007  . DISC DEGENERATION 05/11/2007  . SCIATICA 05/11/2007   Becky Saxasey Maygan Koeller, LPTA [phone]  Juel BurrowCockerham, Melvine Julin Jo 03/23/2014, 10:41 AM  [facility] [address] [place], [name], [address] Phone: [phone]   Fax:  [phone]

## 2014-03-30 ENCOUNTER — Ambulatory Visit (HOSPITAL_COMMUNITY): Payer: Medicare Other

## 2014-04-04 ENCOUNTER — Ambulatory Visit (HOSPITAL_COMMUNITY)
Admission: RE | Admit: 2014-04-04 | Discharge: 2014-04-04 | Disposition: A | Discharge status: Home / Self Care | Payer: Medicare Other | Source: Ambulatory Visit | Attending: Family Medicine | Admitting: Family Medicine

## 2014-04-04 ENCOUNTER — Ambulatory Visit (INDEPENDENT_AMBULATORY_CARE_PROVIDER_SITE_OTHER): Payer: Self-pay | Admitting: Orthopedic Surgery

## 2014-04-04 VITALS — BP 125/65 | Ht 63.0 in | Wt 148.0 lb

## 2014-04-04 DIAGNOSIS — R531 Weakness: Secondary | ICD-10-CM | POA: Insufficient documentation

## 2014-04-04 DIAGNOSIS — M25562 Pain in left knee: Secondary | ICD-10-CM | POA: Insufficient documentation

## 2014-04-04 DIAGNOSIS — R269 Unspecified abnormalities of gait and mobility: Secondary | ICD-10-CM

## 2014-04-04 DIAGNOSIS — R262 Difficulty in walking, not elsewhere classified: Secondary | ICD-10-CM | POA: Diagnosis not present

## 2014-04-04 DIAGNOSIS — M25662 Stiffness of left knee, not elsewhere classified: Secondary | ICD-10-CM | POA: Diagnosis not present

## 2014-04-04 DIAGNOSIS — R29898 Other symptoms and signs involving the musculoskeletal system: Secondary | ICD-10-CM

## 2014-04-04 DIAGNOSIS — Z9889 Other specified postprocedural states: Secondary | ICD-10-CM

## 2014-04-04 DIAGNOSIS — M23322 Other meniscus derangements, posterior horn of medial meniscus, left knee: Secondary | ICD-10-CM

## 2014-04-04 DIAGNOSIS — M129 Arthropathy, unspecified: Secondary | ICD-10-CM

## 2014-04-04 DIAGNOSIS — M1710 Unilateral primary osteoarthritis, unspecified knee: Secondary | ICD-10-CM

## 2014-04-04 DIAGNOSIS — M25362 Other instability, left knee: Secondary | ICD-10-CM

## 2014-04-04 DIAGNOSIS — Z4789 Encounter for other orthopedic aftercare: Secondary | ICD-10-CM | POA: Diagnosis present

## 2014-04-04 DIAGNOSIS — M1712 Unilateral primary osteoarthritis, left knee: Secondary | ICD-10-CM

## 2014-04-04 MED ORDER — HYDROCODONE-ACETAMINOPHEN 5-325 MG PO TABS: 1.0000 | ORAL_TABLET | ORAL | Status: DC | PRN

## 2014-04-04 NOTE — Progress Notes (Signed)
Patient ID: Alyssa Poole, female   DOB: Jul 12, 1960, 54 y.o.   MRN: [id number] Chief Complaint  Patient presents with  . Follow-up    Post op #2, left knee, SALK. DOS 03-09-14.    The patient had significant osteoarthritis of the left knee had a chondroplasty along with her medial meniscectomy. She complains of pain over the medial portal site and difficulty bending her right knee past about 115 he says is very sore. She declined pain medication last time and I think that is inhibiting some of her progress  She is in therapy she has 2 more weeks I would like her to continue. She has no swelling shows a good straight leg raise the only problem is she has pain at 115 flexion.  Recommend continue therapy follow-up. She should take some Norco for pain  When I see her next time we will discuss arthritis medication, knee bracing and cortisone injection.

## 2014-04-04 NOTE — Therapy (Signed)
[facility] [address] [place], [place], [address] Phone: [phone]   Fax:  [phone]  Physical Therapy Treatment  Patient Details  Name: Alyssa Poole MRN: [id number] Date of Birth: Dec 11, 1960  Encounter Date: 04/04/2014      PT End of Session - 04/04/14 1422    Visit Number 3   Number of Visits 17   Date for PT Re-Evaluation 04/20/14   Authorization Type medicre   Authorization - Visit Number 3   Authorization - Number of Visits 10   PT Start Time 1340   PT Stop Time 1428   PT Time Calculation (min) 48 min   Activity Tolerance Patient tolerated treatment well   Behavior During Therapy [facility] for tasks assessed/performed      Past Medical History  Diagnosis Date  . Hypertension   . Anxiety   . Osteoporosis     Past Surgical History  Procedure Laterality Date  . Back surgery    . Cervical spine surgery    . Cholecystectomy    . Knee arthroscopy with medial menisectomy Left 03/09/2014    Procedure: LEFT KNEE ARTHROSCOPY WITH PARTIAL MEDIAL MENISECTOMY;  Surgeon: Vickki Hearing, MD;  Location: [facility];  Service: Orthopedics;  Laterality: Left;    There were no vitals taken for this visit.  Visit Diagnosis:  Knee arthropathy  Knee stiffness, left  Knee buckling, left  Weakness of left leg  Abnormality of gait  Difficulty walking      Subjective Assessment - 04/04/14 1356    Symptoms Pt saw MD this morning, and MD reports pt needs to work on knee flexion.  Pt re-scheduled to see MD in 3 weeks to assess MD knee ROM.           [facility] PT Assessment - 04/04/14 1342    Assessment   Medical Diagnosis Lt knee pain, weakness and stiffness s/p knee arthroscopic surgery for medial meniscectomy   Onset Date 03/09/14   Next MD Visit Romeo Apple, 04/26/14   Balance   Balance Assessed Yes   Static Standing Balance   Static Standing - Balance Support No upper extremity supported   Static Standing - Level of  Assistance 5: Stand by assistance   Static Standing - Comment/# of Minutes Tandem Rt/Lt 60", SLS Rt 9.12" Lt 8.53"             [facility] Adult PT Treatment/Exercise - 04/04/14 1341    Exercises   Exercises Knee/Hip   Knee/Hip Exercises: Stretches   Active Hamstring Stretch 3 reps;30 seconds;Limitations   Active Hamstring Stretch Limitations 14" Box   Quad Stretch 3 reps;30 seconds   Quad Stretch Limitations prone with rope   Knee: Self-Stretch to increase Flexion Other (comment)   Knee: Self-Stretch Limitations 5" x10 on 14" Box   Gastroc Stretch 3 reps;30 seconds   Gastroc Stretch Limitations slant board   Knee/Hip Exercises: Aerobic   Stationary Bike Seat 4, 5' for knee flexion   Knee/Hip Exercises: Standing   Heel Raises 2 sets;10 reps  no HHA   Heel Raises Limitations Toe Raises 2x10, with 1 HHA   Lateral Step Up 10 reps;Hand Hold: 1;Step Height: 4";Left   Forward Step Up 10 reps;Hand Hold: 0;Step Height: 4";Left   Functional Squat 15 reps   SLS Rt 9.12", Lt 8.53"   SLS with Vectors Tandem Balance Rt/Lt 60"   Knee/Hip Exercises: Supine   Heel Slides 2 sets;10 reps   Heel Slides Limitations AROM x10,  with rope x10   Bridges 2 sets;10 reps   Knee/Hip Exercises: Sidelying   Hip ABduction 2 sets;10 reps;Left   Knee/Hip Exercises: Prone   Hamstring Curl 2 sets;10 reps   Hamstring Curl Limitations 1 1/2#   Hip Extension 2 sets;10 reps;Left             PT Short Term Goals - 04/04/14 1425    PT SHORT TERM GOAL #1   Title Patient will demosntrate full knee extension to decrease limping and normalize stride length   Status On-going   PT SHORT TERM GOAL #2   Title Patient will demosntrate increased knee flexion to >100 degrees to be able to sit to normal height chair without UE asistance.    Status On-going   PT SHORT TERM GOAL #3   Title patient will demonstrate increased glut med/max strength of 3/5 MMT bilaterally to be able to sit to stand withtou UE assistance 5x  in <15 seconds.    Status On-going   PT SHORT TERM GOAL #4   Title Patient will dmeosntrate increased hamstring strength of 4-/5 MMT to be able to increased knee stability during gait and prevent buckling.    Status On-going   PT SHORT TERM GOAL #5   Title Patient will state independence with HEP.    Status On-going           PT Long Term Goals - 04/04/14 1425    PT LONG TERM GOAL #1   Title Patient will demosntrate increased knee flexion to >120 degrees to be able to sit to normal height chair without UE asistance.    Status On-going   PT LONG TERM GOAL #2   Title patient will demonstrate increased glut med/max strength of 4/5 MMT bilaterally to be able to sit to stand withtou UE assistance 5x in <15 seconds.    Status On-going   PT LONG TERM GOAL #3   Title Patient will dmeosntrate increased hamstring strength of 5/5 MMT to be able to increased knee stability durign gait and prevent buckling.    Status On-going   PT LONG TERM GOAL #4   Title Patient will be able to walk withotu knee pain >25minutes.    Status On-going            Plan - 04/04/14 1423    Clinical Impression Statement Pt reports she saw MD, and he expressed some concern with limited knee flexion/stiffness with knee flexion.  Continued exercise program today, working on full ROM during therapeutic exercises for flexibility/ROM/strengthening.  Pt did express some concern/fear with "breaking" knee, and educated pt program provided by PT would not "break" knee but would work on deficiets of ROM/strength in the lower extremity.  Added step ups, educating pt on technique for knee flexion to avoid compensations and decreasing use of UE; no complaints of pain with activiity and recommend progression to higher step next visit.     Pt will benefit from skilled therapeutic intervention in order to improve on the following deficits Abnormal gait;Decreased strength;Pain;Difficulty walking;Decreased mobility;Decreased activity  tolerance;Decreased range of motion;Decreased balance;Increased fascial restricitons;Improper body mechanics;Decreased endurance;Impaired flexibility   Rehab Potential Good   PT Frequency 2x / week   PT Duration 8 weeks   PT Treatment/Interventions Therapeutic exercise;Balance training;Passive range of motion;Neuromuscular re-education;Patient/family education;Gait training;Stair training;Manual techniques;Functional mobility training;Therapeutic activities   PT Next Visit Plan Increase step height to 6" for forward/lateral step ups        Problem List Patient Active Problem List  Diagnosis Date Noted  . Knee contusion 12/27/2013  . Medial meniscus, posterior horn derangement 12/27/2013  . Arthritis of knee, left 12/27/2013  . Left knee pain 12/27/2013  . 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 KNEE PAIN 05/11/2007  . DISC DEGENERATION 05/11/2007  . SCIATICA 05/11/2007   Kellie Shropshire, DPT [phone]  [facility] [address] [place], [place], [address] Phone: [phone]   Fax:  [phone]

## 2014-04-06 ENCOUNTER — Ambulatory Visit (HOSPITAL_COMMUNITY): Payer: Medicare Other | Admitting: Physical Therapy

## 2014-04-08 ENCOUNTER — Encounter (HOSPITAL_COMMUNITY): Payer: Medicare Other | Admitting: Physical Therapy

## 2014-04-11 ENCOUNTER — Ambulatory Visit (HOSPITAL_COMMUNITY): Payer: Medicare Other | Admitting: Physical Therapy

## 2014-04-13 ENCOUNTER — Ambulatory Visit (HOSPITAL_COMMUNITY): Payer: Medicare Other

## 2014-04-15 ENCOUNTER — Encounter (HOSPITAL_COMMUNITY): Payer: Medicare Other | Admitting: Physical Therapy

## 2014-04-26 ENCOUNTER — Ambulatory Visit: Payer: Medicare Other | Admitting: Orthopedic Surgery

## 2014-05-02 ENCOUNTER — Ambulatory Visit (INDEPENDENT_AMBULATORY_CARE_PROVIDER_SITE_OTHER): Payer: Self-pay | Admitting: Orthopedic Surgery

## 2014-05-02 VITALS — BP 109/63 | Ht 63.0 in | Wt 148.0 lb

## 2014-05-02 DIAGNOSIS — M23322 Other meniscus derangements, posterior horn of medial meniscus, left knee: Secondary | ICD-10-CM

## 2014-05-02 DIAGNOSIS — Z9889 Other specified postprocedural states: Secondary | ICD-10-CM

## 2014-05-02 DIAGNOSIS — M1712 Unilateral primary osteoarthritis, left knee: Secondary | ICD-10-CM

## 2014-05-02 DIAGNOSIS — M129 Arthropathy, unspecified: Secondary | ICD-10-CM

## 2014-05-02 MED ORDER — DICLOFENAC POTASSIUM 50 MG PO TABS: 50.0000 mg | ORAL_TABLET | Freq: Two times a day (BID) | ORAL | Status: DC

## 2014-05-02 NOTE — Progress Notes (Signed)
Patient ID: Alyssa Poole, female   DOB: 1960/12/08, 54 y.o.   MRN: [id number] Chief Complaint  Patient presents with  . Follow-up    follow up SALK, DOS 03/09/14    This is a follow-up after arthroscopy left knee partial meniscectomy and chondroplasty medial femoral condyle about 2 months ago. She is on naproxen and hydrocodone 10 mg and is having some medial knee pain and trouble going down the steps. She has regained her range of motion to 125 knee flexion has a good straight leg raise no joint effusion but tenderness over the medial femoral condyle  She is on naproxen and then a change to diclofenac 50 mg twice a day to try for 3 months if no improvement increase to 75 mg.  Follow-up 3 months

## 2014-05-12 ENCOUNTER — Other Ambulatory Visit (HOSPITAL_COMMUNITY): Payer: Self-pay | Admitting: Family Medicine

## 2014-05-12 ENCOUNTER — Ambulatory Visit (HOSPITAL_COMMUNITY)
Admission: RE | Admit: 2014-05-12 | Discharge: 2014-05-12 | Disposition: A | Discharge status: Home / Self Care | Payer: Medicare Other | Source: Ambulatory Visit | Attending: Family Medicine | Admitting: Family Medicine

## 2014-05-12 DIAGNOSIS — M5030 Other cervical disc degeneration, unspecified cervical region: Secondary | ICD-10-CM | POA: Diagnosis not present

## 2014-05-12 DIAGNOSIS — M542 Cervicalgia: Secondary | ICD-10-CM | POA: Diagnosis present

## 2014-05-31 ENCOUNTER — Other Ambulatory Visit (HOSPITAL_COMMUNITY): Payer: Self-pay | Admitting: Family Medicine

## 2014-05-31 DIAGNOSIS — M542 Cervicalgia: Secondary | ICD-10-CM

## 2014-06-02 ENCOUNTER — Other Ambulatory Visit (HOSPITAL_COMMUNITY): Payer: Medicare Other

## 2014-06-02 ENCOUNTER — Ambulatory Visit (HOSPITAL_COMMUNITY)
Admission: RE | Admit: 2014-06-02 | Discharge: 2014-06-02 | Disposition: A | Discharge status: Home / Self Care | Payer: Medicare Other | Source: Ambulatory Visit | Attending: Family Medicine | Admitting: Family Medicine

## 2014-06-02 DIAGNOSIS — M5021 Other cervical disc displacement,  high cervical region: Secondary | ICD-10-CM | POA: Insufficient documentation

## 2014-06-02 DIAGNOSIS — J323 Chronic sphenoidal sinusitis: Secondary | ICD-10-CM | POA: Insufficient documentation

## 2014-06-02 DIAGNOSIS — M542 Cervicalgia: Secondary | ICD-10-CM | POA: Insufficient documentation

## 2014-06-02 DIAGNOSIS — M5022 Other cervical disc displacement, mid-cervical region: Secondary | ICD-10-CM | POA: Diagnosis not present

## 2014-06-07 ENCOUNTER — Encounter (HOSPITAL_COMMUNITY): Payer: Self-pay | Admitting: Physical Therapy

## 2014-06-07 NOTE — Therapy (Signed)
[place], [place], [address] Phone: [phone]   Fax:  [phone]  Patient Details  Name: Alyssa Poole MRN: [id number] Date of Birth: 11/04/1960 Referring Provider:  No ref. provider found  Encounter Date: 06/07/2014  PHYSICAL THERAPY DISCHARGE SUMMARY  Visits from Start of Care: 3  Current functional level related to goals / functional outcomes: PT SHORT TERM GOAL #1    Title Patient will demosntrate full knee extension to decrease limping and normalize stride length   Status Not Met   PT SHORT TERM GOAL #2   Title Patient will demosntrate increased knee flexion to >100 degrees to be able to sit to normal height chair without UE asistance.    Status Not Met   PT SHORT TERM GOAL #3   Title patient will demonstrate increased glut med/max strength of 3/5 MMT bilaterally to be able to sit to stand withtou UE assistance 5x in <15 seconds.    Status Not Met   PT SHORT TERM GOAL #4   Title Patient will dmeosntrate increased hamstring strength of 4-/5 MMT to be able to increased knee stability during gait and prevent buckling.    Status Not Met   PT SHORT TERM GOAL #5   Title Patient will state independence with HEP.    Status Not Met           PT Long Term Goals - 04/04/14 1425    PT LONG TERM GOAL #1   Title Patient will demosntrate increased knee flexion to >120 degrees to be able to sit to normal height chair without UE asistance.    Status Not Met   PT LONG TERM GOAL #2   Title patient will demonstrate increased glut med/max strength of 4/5 MMT bilaterally to be able to sit to stand withtou UE assistance 5x in <15 seconds.    Status Not Met   PT LONG TERM GOAL #3   Title Patient will dmeosntrate increased hamstring strength of 5/5 MMT to be able to increased knee stability durign gait and prevent buckling.    Status Not Met   PT LONG  TERM GOAL #4   Title Patient will be able to walk withotu knee pain >35mnutes.    Status Not Met        Plan: Patient agrees to discharge.  Patient goals were not met. Patient is being discharged due to not returning since the last visit.  ?????       CDevona KonigPT DPT [id number]SSh[address] [address]Phone: [phone]  Fax:  [phone]

## 2014-07-12 ENCOUNTER — Encounter: Payer: Self-pay | Admitting: Orthopedic Surgery

## 2014-07-12 ENCOUNTER — Ambulatory Visit (INDEPENDENT_AMBULATORY_CARE_PROVIDER_SITE_OTHER): Payer: Medicare Other | Admitting: Orthopedic Surgery

## 2014-07-12 VITALS — BP 124/74 | Ht 63.0 in | Wt 148.0 lb

## 2014-07-12 DIAGNOSIS — M1712 Unilateral primary osteoarthritis, left knee: Secondary | ICD-10-CM

## 2014-07-12 DIAGNOSIS — Z9889 Other specified postprocedural states: Secondary | ICD-10-CM

## 2014-07-12 DIAGNOSIS — M129 Arthropathy, unspecified: Secondary | ICD-10-CM

## 2014-07-12 MED ORDER — PREDNISONE (PAK) 5 MG PO TABS: ORAL_TABLET | ORAL | Status: DC

## 2014-07-12 NOTE — Progress Notes (Signed)
Patient ID: Silvano RuskBobbie W Benton, female   DOB: 10-09-1960, 54 y.o.   MRN: [id number] Chief Complaint  Patient presents with  . Knee Pain    sudden onset left knee pain, no injury, SALK 03/09/14    Acute onset of knee pain after arthroscopy back in December. She was raising her leg to go over something in her yard and felt acute pain she did it twice and she comes for evaluation present and swelling she was having some discomfort prior to this as well  Review of systems denies numbness or tingling. Gen. appearance is normal she has a medium size joint effusion her knee flexion is limited at 100 due to pressure stability is normal strength is normal McMurray sign is negative neurovascular exam is intact  We aspirated about 25 mL of clear fluid I did not see any cartilage particles in it. We injected Depo-Medrol and started on a 12 day Dosepak advise rest ice and a follow-up in 3 weeks  When she comes back and would like to discuss possibility of monovisc injections for her

## 2014-07-12 NOTE — Patient Instructions (Signed)
Keep previously scheduled follow up appointment

## 2014-07-22 ENCOUNTER — Other Ambulatory Visit: Payer: Self-pay | Admitting: Orthopedic Surgery

## 2014-08-02 ENCOUNTER — Telehealth: Payer: Self-pay | Admitting: Orthopedic Surgery

## 2014-08-02 ENCOUNTER — Ambulatory Visit (INDEPENDENT_AMBULATORY_CARE_PROVIDER_SITE_OTHER): Payer: Medicare Other | Admitting: Orthopedic Surgery

## 2014-08-02 DIAGNOSIS — Z9889 Other specified postprocedural states: Secondary | ICD-10-CM | POA: Diagnosis not present

## 2014-08-02 DIAGNOSIS — M1712 Unilateral primary osteoarthritis, left knee: Secondary | ICD-10-CM

## 2014-08-02 DIAGNOSIS — M129 Arthropathy, unspecified: Secondary | ICD-10-CM | POA: Diagnosis not present

## 2014-08-02 MED ORDER — HYDROCODONE-ACETAMINOPHEN 10-325 MG PO TABS: 1.0000 | ORAL_TABLET | Freq: Four times a day (QID) | ORAL | Status: DC | PRN

## 2014-08-02 NOTE — Telephone Encounter (Signed)
Patient is calling requesting her medication diclofenac (CATAFLAM) 50 MG tablet be sent to [facility], please advise?

## 2014-08-02 NOTE — Progress Notes (Signed)
The patient had arthroscopy December rated 25 mL of clear fluid without any cartilage particles upper on a 12 day dose pack and injected Depo-Medrol she's stable at the moment  System review no catching locking giving way but persistent crepitance and pain in the medial compartment crepitance in the patellofemoral compartment  Alyssa Poole to date is no swelling she's regained her full range of motion she has crepitance and pain with patellofemoral manipulation no apprehension motor exam is normal knee is stable medial joint line tenderness noted. Neurovascular exam intact  Recommend continue Norco 10 mg every 6 #100 follow-up in 6 months for x-rays left knee continue Naprosyn 500 twice a day  Prescriptions written  Patient understands treatment plan

## 2014-08-02 NOTE — Telephone Encounter (Signed)
Ok to refill 

## 2014-08-02 NOTE — Patient Instructions (Signed)
Continue current medications. 

## 2014-08-04 ENCOUNTER — Other Ambulatory Visit: Payer: Self-pay | Admitting: *Deleted

## 2014-08-04 DIAGNOSIS — Z9889 Other specified postprocedural states: Secondary | ICD-10-CM

## 2014-08-04 DIAGNOSIS — M23322 Other meniscus derangements, posterior horn of medial meniscus, left knee: Secondary | ICD-10-CM

## 2014-08-04 DIAGNOSIS — M1712 Unilateral primary osteoarthritis, left knee: Secondary | ICD-10-CM

## 2014-08-04 MED ORDER — DICLOFENAC POTASSIUM 50 MG PO TABS: 50.0000 mg | ORAL_TABLET | Freq: Two times a day (BID) | ORAL | Status: DC

## 2014-08-04 NOTE — Telephone Encounter (Signed)
Called patient to advise not necessary to pick up in office but no answer  Will leave up front for pick up

## 2014-08-04 NOTE — Telephone Encounter (Signed)
yes

## 2014-08-04 NOTE — Telephone Encounter (Signed)
Alyssa NiemannJaime, [name] was on the machine this morning stating that [facility] does not [facility], she is coming by this office this morning around 10 to pick up written Rx, please advise?

## 2014-08-10 ENCOUNTER — Emergency Department (HOSPITAL_COMMUNITY)
Admission: EM | Admit: 2014-08-10 | Discharge: 2014-08-10 | Disposition: A | Discharge status: Home / Self Care | Payer: Medicare Other | Attending: Emergency Medicine | Admitting: Emergency Medicine

## 2014-08-10 ENCOUNTER — Encounter (HOSPITAL_COMMUNITY): Payer: Self-pay | Admitting: Emergency Medicine

## 2014-08-10 ENCOUNTER — Emergency Department (HOSPITAL_COMMUNITY): Payer: Medicare Other

## 2014-08-10 DIAGNOSIS — Z791 Long term (current) use of non-steroidal anti-inflammatories (NSAID): Secondary | ICD-10-CM | POA: Diagnosis not present

## 2014-08-10 DIAGNOSIS — S39012A Strain of muscle, fascia and tendon of lower back, initial encounter: Secondary | ICD-10-CM | POA: Insufficient documentation

## 2014-08-10 DIAGNOSIS — Y9289 Other specified places as the place of occurrence of the external cause: Secondary | ICD-10-CM | POA: Insufficient documentation

## 2014-08-10 DIAGNOSIS — Z79899 Other long term (current) drug therapy: Secondary | ICD-10-CM | POA: Insufficient documentation

## 2014-08-10 DIAGNOSIS — X58XXXA Exposure to other specified factors, initial encounter: Secondary | ICD-10-CM | POA: Insufficient documentation

## 2014-08-10 DIAGNOSIS — I10 Essential (primary) hypertension: Secondary | ICD-10-CM | POA: Insufficient documentation

## 2014-08-10 DIAGNOSIS — Z87891 Personal history of nicotine dependence: Secondary | ICD-10-CM | POA: Insufficient documentation

## 2014-08-10 DIAGNOSIS — Y998 Other external cause status: Secondary | ICD-10-CM | POA: Insufficient documentation

## 2014-08-10 DIAGNOSIS — F419 Anxiety disorder, unspecified: Secondary | ICD-10-CM | POA: Diagnosis not present

## 2014-08-10 DIAGNOSIS — M810 Age-related osteoporosis without current pathological fracture: Secondary | ICD-10-CM | POA: Insufficient documentation

## 2014-08-10 DIAGNOSIS — Y9389 Activity, other specified: Secondary | ICD-10-CM | POA: Diagnosis not present

## 2014-08-10 DIAGNOSIS — S3992XA Unspecified injury of lower back, initial encounter: Secondary | ICD-10-CM | POA: Diagnosis present

## 2014-08-10 DIAGNOSIS — M5136 Other intervertebral disc degeneration, lumbar region: Secondary | ICD-10-CM | POA: Diagnosis not present

## 2014-08-10 MED ORDER — DICLOFENAC SODIUM 75 MG PO TBEC
75.0000 mg | DELAYED_RELEASE_TABLET | Freq: Two times a day (BID) | ORAL | Status: DC
Start: 2014-08-10 — End: 2014-12-22

## 2014-08-10 MED ORDER — BACLOFEN 10 MG PO TABS: 10.0000 mg | ORAL_TABLET | Freq: Three times a day (TID) | ORAL | Status: DC

## 2014-08-10 MED ORDER — KETOROLAC TROMETHAMINE 10 MG PO TABS
10.0000 mg | ORAL_TABLET | Freq: Once | ORAL | Status: AC
  Administered 2014-08-10: 10 mg via ORAL
  Filled 2014-08-10: qty 1

## 2014-08-10 MED ORDER — DIAZEPAM 5 MG PO TABS
5.0000 mg | ORAL_TABLET | Freq: Once | ORAL | Status: AC
  Administered 2014-08-10: 5 mg via ORAL
  Filled 2014-08-10: qty 1

## 2014-08-10 NOTE — ED Provider Notes (Signed)
CSN: [id number]     Arrival date & time 08/10/14  1032 History   First MD Initiated Contact with Patient 08/10/14 1117     Chief Complaint  Patient presents with  . Back Pain     (Consider location/radiation/quality/duration/timing/severity/associated sxs/prior Treatment) Patient is a 54 y.o. female presenting with back pain. The history is provided by the patient.  Back Pain Location:  Lumbar spine Quality:  Stabbing Pain severity:  Moderate Pain is:  Same all the time Onset quality:  Sudden Timing:  Intermittent Progression:  Worsening Chronicity: acute on chronic. Context: lifting heavy objects   Worsened by:  Movement and bending Ineffective treatments:  Lying down Associated symptoms: no bladder incontinence, no bowel incontinence, no paresthesias and no perianal numbness     Past Medical History  Diagnosis Date  . Hypertension   . Anxiety   . Osteoporosis    Past Surgical History  Procedure Laterality Date  . Back surgery    . Cervical spine surgery    . Cholecystectomy    . Knee arthroscopy with medial menisectomy Left 03/09/2014    Procedure: LEFT KNEE ARTHROSCOPY WITH PARTIAL MEDIAL MENISECTOMY;  Surgeon: Vickki HearingStanley E Harrison, MD;  Location: [facility];  Service: Orthopedics;  Laterality: Left;   History reviewed. No pertinent family history. History  Substance Use Topics  . Smoking status: Former Smoker -- 1.50 packs/day for 35 years    Quit date: 03/03/2008  . Smokeless tobacco: Never Used  . Alcohol Use: No   OB History    No data available     Review of Systems  Gastrointestinal: Negative for bowel incontinence.  Genitourinary: Negative for bladder incontinence.  Musculoskeletal: Positive for back pain and arthralgias.  Neurological: Negative for paresthesias.  Psychiatric/Behavioral: The patient is nervous/anxious.   All other systems reviewed and are negative.     Allergies  Bee venom  Home Medications   Prior to Admission medications     Medication Sig Start Date End Date Taking? Authorizing Provider  alendronate (FOSAMAX) 70 MG tablet Take 70 mg by mouth every 7 (seven) days. Takes on Sunday. Take with a full glass of water on an empty stomach.   Yes Historical Provider, MD  Black Cohosh 175 MG CAPS Take 175 mg by mouth 3 (three) times daily.   Yes Historical Provider, MD  Calcium Carbonate-Vitamin D (CALCIUM 600 + D PO) Take 1 tablet by mouth daily.     Yes Historical Provider, MD  diclofenac (CATAFLAM) 50 MG tablet Take 1 tablet (50 mg total) by mouth 2 (two) times daily. 08/04/14  Yes Vickki HearingStanley E Harrison, MD  HYDROcodone-acetaminophen [facility](NORCO) 10-325 MG per tablet Take 1 tablet by mouth every 6 (six) hours as needed. Patient taking differently: Take 1 tablet by mouth every 6 (six) hours as needed for moderate pain.  08/02/14  Yes Vickki HearingStanley E Harrison, MD  LORazepam (ATIVAN) 0.5 MG tablet Take 0.5 mg by mouth at bedtime. 02/12/14  Yes Historical Provider, MD  losartan-hydrochlorothiazide (HYZAAR) 50-12.5 MG per tablet Take 1 tablet by mouth daily.     Yes Historical Provider, MD  naproxen (NAPROSYN) 500 MG tablet Take 500 mg by mouth 2 (two) times daily with a meal.   Yes Historical Provider, MD  pantoprazole (PROTONIX) 40 MG tablet Take 40 mg by mouth Daily.  01/27/12  Yes Historical Provider, MD  PARoxetine (PAXIL) 20 MG tablet Take 20 mg by mouth daily.    Yes Historical Provider, MD  pregabalin (LYRICA) 50 MG capsule Take 50  mg by mouth at bedtime.   Yes Historical Provider, MD  predniSONE (STERAPRED UNI-PAK) 5 MG TABS tablet Use as directed Patient not taking: Reported on 08/10/2014 07/12/14   Vickki HearingStanley E Harrison, MD  promethazine (PHENERGAN) 12.5 MG tablet Take 1 tablet (12.5 mg total) by mouth every 6 (six) hours as needed for nausea or vomiting. Patient not taking: Reported on 08/10/2014 03/09/14   Vickki HearingStanley E Harrison, MD   BP 129/83 mmHg  Pulse 70  Temp(Src) 98.1 F (36.7 C) (Oral)  Resp 18  Ht 5\' 3"  (1.6 m)  Wt 144 lb  (65.318 kg)  BMI 25.51 kg/m2  SpO2 100% Physical Exam  Musculoskeletal:       Lumbar back: She exhibits decreased range of motion, tenderness and spasm.       Back:    ED Course  Procedures (including critical care time) Labs Review Labs Reviewed - No data to display  Imaging Review No results found.   EKG Interpretation None      MDM  No gross neuro deficit. No evidence for caudal equina. Exam suggest lumbar strain. Rx for diclofenac and baclofen given to the patient. Pt referred to orthopedics for additional management.   Final diagnoses:  None    *I have reviewed nursing notes, vital signs, and all appropriate lab and imaging results for this patient.**    Ivery QualeHobson Nickole Adamek, PA-C 08/12/14 2127  Bethann BerkshireJoseph Zammit, MD 08/15/14 [phone]

## 2014-08-10 NOTE — ED Notes (Signed)
Pt states that she lifted her mother out of bed on Saturday and has been having pain in middle of back since then.

## 2014-08-10 NOTE — Discharge Instructions (Signed)
Your x-ray is negative for hidden fracture. There are some degenerative disc disease changes present, and your examination suggests muscle strain. Please use medications as suggested. Baclofen may cause drowsiness, please use with caution. Please see Dr. Delbert Harnesson Diego as soon as possible for evaluation and recheck concerning your back and the muscle spasms. Lumbosacral Strain Lumbosacral strain is a strain of any of the parts that make up your lumbosacral vertebrae. Your lumbosacral vertebrae are the bones that make up the lower third of your backbone. Your lumbosacral vertebrae are held together by muscles and tough, fibrous tissue (ligaments).  CAUSES  A sudden blow to your back can cause lumbosacral strain. Also, anything that causes an excessive stretch of the muscles in the low back can cause this strain. This is typically seen when people exert themselves strenuously, fall, lift heavy objects, bend, or crouch repeatedly. RISK FACTORS  Physically demanding work.  Participation in pushing or pulling sports or sports that require a sudden twist of the back (tennis, golf, baseball).  Weight lifting.  Excessive lower back curvature.  Forward-tilted pelvis.  Weak back or abdominal muscles or both.  Tight hamstrings. SIGNS AND SYMPTOMS  Lumbosacral strain may cause pain in the area of your injury or pain that moves (radiates) down your leg.  DIAGNOSIS Your health care provider can often diagnose lumbosacral strain through a physical exam. In some cases, you may need tests such as X-ray exams.  TREATMENT  Treatment for your lower back injury depends on many factors that your clinician will have to evaluate. However, most treatment will include the use of anti-inflammatory medicines. HOME CARE INSTRUCTIONS   Avoid hard physical activities (tennis, racquetball, waterskiing) if you are not in proper physical condition for it. This may aggravate or create problems.  If you have a back problem,  avoid sports requiring sudden body movements. Swimming and walking are generally safer activities.  Maintain good posture.  Maintain a healthy weight.  For acute conditions, you may put ice on the injured area.  Put ice in a plastic bag.  Place a towel between your skin and the bag.  Leave the ice on for 20 minutes, 2-3 times a day.  When the low back starts healing, stretching and strengthening exercises may be recommended. SEEK MEDICAL CARE IF:  Your back pain is getting worse.  You experience severe back pain not relieved with medicines. SEEK IMMEDIATE MEDICAL CARE IF:   You have numbness, tingling, weakness, or problems with the use of your arms or legs.  There is a change in bowel or bladder control.  You have increasing pain in any area of the body, including your belly (abdomen).  You notice shortness of breath, dizziness, or feel faint.  You feel sick to your stomach (nauseous), are throwing up (vomiting), or become sweaty.  You notice discoloration of your toes or legs, or your feet get very cold. MAKE SURE YOU:   Understand these instructions.  Will watch your condition.  Will get help right away if you are not doing well or get worse. Document Released: 12/26/2004 Document Revised: 03/23/2013 Document Reviewed: 11/04/2012 [facility] Patient Information 2015 [place], [place]. This information is not intended to replace advice given to you by your health care provider. Make sure you discuss any questions you have with your health care provider.  Degenerative Disk Disease Degenerative disk disease is a condition caused by the changes that occur in the cushions of the backbone (spinal disks) as you grow older. Spinal disks are soft  and compressible disks located between the bones of the spine (vertebrae). They act like shock absorbers. Degenerative disk disease can affect the whole spine. However, the neck and lower back are most commonly affected. Many changes can  occur in the spinal disks with aging, such as:  The spinal disks may dry and shrink.  Small tears may occur in the tough, outer covering of the disk (annulus).  The disk space may become smaller due to loss of water.  Abnormal growths in the bone (spurs) may occur. This can put pressure on the nerve roots exiting the spinal canal, causing pain.  The spinal canal may become narrowed. CAUSES  Degenerative disk disease is a condition caused by the changes that occur in the spinal disks with aging. The exact cause is not known, but there is a genetic basis for many patients. Degenerative changes can occur due to loss of fluid in the disk. This makes the disk thinner and reduces the space between the backbones. Small cracks can develop in the outer layer of the disk. This can lead to the breakdown of the disk. You are more likely to get degenerative disk disease if you are overweight. Smoking cigarettes and doing heavy work such as weightlifting can also increase your risk of this condition. Degenerative changes can start after a sudden injury. Growth of bone spurs can compress the nerve roots and cause pain.  SYMPTOMS  The symptoms vary from person to person. Some people may have no pain, while others have severe pain. The pain may be so severe that it can limit your activities. The location of the pain depends on the part of your backbone that is affected. You will have neck or arm pain if a disk in the neck area is affected. You will have pain in your back, buttocks, or legs if a disk in the lower back is affected. The pain becomes worse while bending, reaching up, or with twisting movements. The pain may start gradually and then get worse as time passes. It may also start after a major or minor injury. You may feel numbness or tingling in the arms or legs.  DIAGNOSIS  Your caregiver will ask you about your symptoms and about activities or habits that may cause the pain. He or she may also ask about  any injuries, diseases, or treatments you have had earlier. Your caregiver will examine you to check for the range of movement that is possible in the affected area, to check for strength in your extremities, and to check for sensation in the areas of the arms and legs supplied by different nerve roots. An X-ray of the spine may be taken. Your caregiver may suggest other imaging tests, such as magnetic resonance imaging (MRI), if needed.  TREATMENT  Treatment includes rest, modifying your activities, and applying ice and heat. Your caregiver may prescribe medicines to reduce your pain and may ask you to do some exercises to strengthen your back. In some cases, you may need surgery. You and your caregiver will decide on the treatment that is best for you. HOME CARE INSTRUCTIONS   Follow proper lifting and walking techniques as advised by your caregiver.  Maintain good posture.  Exercise regularly as advised.  Perform relaxation exercises.  Change your sitting, standing, and sleeping habits as advised. Change positions frequently.  Lose weight as advised.  Stop smoking if you smoke.  Wear supportive footwear. SEEK MEDICAL CARE IF:  Your pain does not go away within 1 to  4 weeks. SEEK IMMEDIATE MEDICAL CARE IF:   Your pain is severe.  You notice weakness in your arms, hands, or legs.  You begin to lose control of your bladder or bowel movements. MAKE SURE YOU:   Understand these instructions.  Will watch your condition.  Will get help right away if you are not doing well or get worse. Document Released: 01/13/2007 Document Revised: 06/10/2011 Document Reviewed: 07/20/2013 [facility] Patient Information 2015 [place], [place]. This information is not intended to replace advice given to you by your health care provider. Make sure you discuss any questions you have with your health care provider.

## 2014-08-16 ENCOUNTER — Telehealth: Payer: Self-pay | Admitting: Orthopedic Surgery

## 2014-08-16 NOTE — Telephone Encounter (Signed)
No  thursday

## 2014-08-16 NOTE — Telephone Encounter (Signed)
Am appointment?

## 2014-08-16 NOTE — Telephone Encounter (Signed)
Please advise and schedule 

## 2014-08-16 NOTE — Telephone Encounter (Signed)
Patient called regarding left knee (status post arthroscopy 03/09/14) - states swelling again, as it was doing at her last office visit, 08/02/14. She is asking for immediate appointment.  Please advise.  Patient's ph# [phone]

## 2014-08-17 NOTE — Telephone Encounter (Signed)
Called back to patient this morning; scheduled accordingly, for tomorrow, 08/18/14.

## 2014-08-18 ENCOUNTER — Ambulatory Visit (INDEPENDENT_AMBULATORY_CARE_PROVIDER_SITE_OTHER): Payer: Medicare Other | Admitting: Orthopedic Surgery

## 2014-08-18 VITALS — BP 129/69 | Ht 63.0 in | Wt 144.0 lb

## 2014-08-18 DIAGNOSIS — M1712 Unilateral primary osteoarthritis, left knee: Secondary | ICD-10-CM

## 2014-08-18 DIAGNOSIS — Z9889 Other specified postprocedural states: Secondary | ICD-10-CM

## 2014-08-18 DIAGNOSIS — M129 Arthropathy, unspecified: Secondary | ICD-10-CM | POA: Diagnosis not present

## 2014-08-18 DIAGNOSIS — M23322 Other meniscus derangements, posterior horn of medial meniscus, left knee: Secondary | ICD-10-CM

## 2014-08-18 NOTE — Patient Instructions (Signed)
We need a new x-ray next week

## 2014-08-18 NOTE — Progress Notes (Signed)
Encounter Diagnoses  Name Primary?  Marland Kitchen. Arthritis of knee, left Yes  . S/P arthroscopy of left knee   . Medial meniscus, posterior horn derangement, left     Chief Complaint  Patient presents with  . Joint Swelling    Follow up SALK DOS 03/09/14 c/o knee swelling    This Eliseo GumBenton is coming in today complaining of severe pain. She said Dr. you have to do something  She had aching pain in her leg last night as well as ongoing pain in that left knee with intermittent effusions. She's had a scope. We need to re-x-ray her knee. My Vernona RiegerQuitman is down today and will have to do it next week  Review of systems ongoing back pain  She was advised to have a back fusion. She was in the ER May 11 that x-ray show severe L5-S1 degenerative disease. She's had 3 back surgeries in the past.  She is on 10 mg of Norco for pain in her left knee which doesn't control the pain.  BP 129/69 mmHg  Ht 5\' 3"  (1.6 m)  Wt 144 lb (65.318 kg)  BMI 25.51 kg/m2 She is ambulatory with a limp she has no effusion today she has painful range of motion at 120 of flexion he feels stable muscle tone and strength are normal she had some pain with straight leg raise in her back neurovascular exam is intact mood pleasant oriented 3 appearance normal  I gave her a lidocaine injection test with 20 mL of lidocaine. She tolerated that well and had complete pain relief  She will come in next week for x-ray and then we will schedule her for left total knee

## 2014-08-24 ENCOUNTER — Ambulatory Visit (INDEPENDENT_AMBULATORY_CARE_PROVIDER_SITE_OTHER): Payer: Medicare Other

## 2014-08-24 ENCOUNTER — Ambulatory Visit (INDEPENDENT_AMBULATORY_CARE_PROVIDER_SITE_OTHER): Payer: Medicare Other | Admitting: Orthopedic Surgery

## 2014-08-24 ENCOUNTER — Encounter: Payer: Self-pay | Admitting: Orthopedic Surgery

## 2014-08-24 VITALS — BP 122/65 | Ht 63.0 in | Wt 144.0 lb

## 2014-08-24 DIAGNOSIS — M129 Arthropathy, unspecified: Secondary | ICD-10-CM | POA: Diagnosis not present

## 2014-08-24 DIAGNOSIS — Z9889 Other specified postprocedural states: Secondary | ICD-10-CM

## 2014-08-24 DIAGNOSIS — M1712 Unilateral primary osteoarthritis, left knee: Secondary | ICD-10-CM

## 2014-08-24 NOTE — Patient Instructions (Signed)
SURGERY 09/14/14 LEFT TOTAL KNEE REPLACEMENT   You have decided to proceed with knee replacement surgery. You have decided not to continue with nonoperative measures such as but not limited to oral medication, weight loss, activity modification, physical therapy, bracing, or injection.  We will perform the procedure commonly known as total knee replacement. Some of the risks associated with knee replacement surgery include but are not limited to Bleeding Infection Swelling Stiffness Blood clot Pain that persists even after surgery  Infection is especially devastating complication of knee surgery although rare. If infection does occur your implant will usually have to be removed and several surgeries will be needed to eradicate the infection prior to performing a repeat replacement.   If you're not comfortable with these risks and would like to continue with nonoperative treatment please let Dr. Romeo AppleHarrison know prior to your surgery.

## 2014-08-24 NOTE — Progress Notes (Signed)
Patient ID: Alyssa RuskBobbie W Benton, female   DOB: 1960/10/10, 54 y.o.   MRN: [id number] Chief Complaint  Patient presents with  . Follow-up    follow up + xray left knee, discuss surgery    The patient underwent a lidocaine injection test and got good relief from her left knee pain which is primarily in the medial joint line. She's had arthroscopic surgery and partial meniscal resection but had some chondral changes consistent with osteoarthritis. Postoperatively she's had multiple effusions and progressively increasing pain on the medial joint line. I repeated her x-rays today and she has greater than 75% cartilage loss on the medial compartment. Her leg is still anatomically aligned with physiologic valgus  Knee flexion is approximately 125. She has some tightness in extension.  The knee is otherwise stable neurovascular exam is intact  We do note history of lumbar disc disease and back pain but lidocaine injection test did prove that she has primarily knee pain at this time and she can distinguish between her back symptoms radicular symptoms in her knee pain which is medial and is palpably tender on exam  In any event we decided to go ahead with surgical treatment with a left total knee replacement. We went over the models of the knee. We went over the hospital course.  We explained to her the risks benefits of surgery versus non-surgery  She agrees to surgery  Follow-up after surgery surgery scheduled for June 15 for left total knee arthroplasty  The complete history and physical will be done closer to the time of surgery and is incorporated by reference.

## 2014-08-26 ENCOUNTER — Other Ambulatory Visit: Payer: Self-pay | Admitting: *Deleted

## 2014-09-01 ENCOUNTER — Telehealth: Payer: Self-pay | Admitting: Orthopedic Surgery

## 2014-09-01 NOTE — Telephone Encounter (Signed)
Regarding in-patient/admit [facility]14[facility]14, CPT [id number] (total knee arthroplasty), per primary Insurer, Medicare, no pre-authorization is required per guidelines; for secondary insurer, Medicaid, no pre-authorization is required per online system, [place] [employer].

## 2014-09-07 NOTE — Patient Instructions (Signed)
Alyssa Poole  09/07/2014     @   Your procedure is scheduled on 09/14/2014  Report to [facility] at 630  A.M.  Call this number if you have problems the morning of surgery:  [phone]   Remember:  Do not eat food or drink liquids after midnight.  Take these medicines the morning of surgery with A SIP OF WATER diclofenac, gabapentin, hydrocodone, hyzaar, protonix, paxil   Do not wear jewelry, make-up or nail polish.  Do not wear lotions, powders, or perfumes.    Do not shave 48 hours prior to surgery.  Men may shave face and neck.  Do not bring valuables to the hospital.  [facility] is not responsible for any belongings or valuables.  Contacts, dentures or bridgework may not be worn into surgery.  Leave your suitcase in the car.  After surgery it may be brought to your room.  For patients admitted to the hospital, discharge time will be determined by your treatment team.  Patients discharged the day of surgery will not be allowed to drive home.   Name and phone number of your driver:   family Special instructions:  none  Please read over the following fact sheets that you were given. Pain Booklet, Coughing and Deep Breathing, Blood Transfusion Information, Lab Information, Total Joint Packet, MRSA Information, Surgical Site Infection Prevention, Anesthesia Post-op Instructions and Care and Recovery After Surgery      Total Knee Replacement Total knee replacement is a procedure to replace your knee joint with an artificial knee joint (prosthetic knee joint). The purpose of this surgery is to reduce pain and improve your knee function. LET [facility] CARE PROVIDER KNOW ABOUT:   Any allergies you have.  All medicines you are taking, including vitamins, herbs, eye drops, creams, and over-the-counter medicines.  Previous problems you or members of your family have had with the use of anesthetics.  Any blood disorders you have.  Previous  surgeries you have had.  Medical conditions you have. RISKS AND COMPLICATIONS  Generally, total knee replacement is a safe procedure. However, problems can occur and include:  Loss of range of motion of the knee or instability.  Loosening of the prosthesis.  Infection.  Persistent pain. BEFORE THE PROCEDURE   Do not eat or drink anything after midnight on the night before the procedure or as directed by your health care provider.  Ask your health care provider about changing or stopping your regular medicines. This is especially important if you are taking diabetes medicines or blood thinners. PROCEDURE   Just before the procedure, you will receive medicine that will make you drowsy (sedative). This will be given through a tube that is inserted into one of your veins (IV tube).  Then you will be given one of the following:  A medicine injected into your spine that numbs your body below the waist (spinal anesthetic).  A medicine that makes you fall asleep (general anesthetic).  You may also receive medicine to block feeling in your leg (nerve block) to help ease pain after surgery.  An incision will be made in your knee. Your surgeon will take out any damaged cartilage and bone by sawing off the damaged surfaces.  The surgeon will then put a new metal liner over the sawed-off portion of your thigh bone (femur) and a plastic liner over the sawed-off portion of one of the bones of your lower leg (tibia). This is to restore  alignment and function to your knee. A plastic piece is often used to restore the surface of your knee cap. AFTER THE PROCEDURE   You will be taken to the recovery area.  You may have drainage tubes to drain excess fluid from your knee. These tubes attach to a device that removes these fluids.  Once you are awake, stable, and taking fluids well, you will be taken to your hospital room.  You will receive physical therapy as prescribed by your health care  provider.  Your surgeon may recommend that you spend time (usually an additional 10-14 days) in an extended-care facility to help you begin walking again and improve your range of motion before you go home.  You may also be prescribed blood-thinning medicine to decrease your risk of developing blood clots in your leg. Document Released: 06/24/2000 Document Revised: 08/02/2013 Document Reviewed: 04/28/2011 [facility] Patient Information 2015 [place], [place]. This information is not intended to replace advice given to you by your health care provider. Make sure you discuss any questions you have with your health care provider. PATIENT INSTRUCTIONS POST-ANESTHESIA  IMMEDIATELY FOLLOWING SURGERY:  Do not drive or operate machinery for the first twenty four hours after surgery.  Do not make any important decisions for twenty four hours after surgery or while taking narcotic pain medications or sedatives.  If you develop intractable nausea and vomiting or a severe headache please notify your doctor immediately.  FOLLOW-UP:  Please make an appointment with your surgeon as instructed. You do not need to follow up with anesthesia unless specifically instructed to do so.  WOUND CARE INSTRUCTIONS (if applicable):  Keep a dry clean dressing on the anesthesia/puncture wound site if there is drainage.  Once the wound has quit draining you may leave it open to air.  Generally you should leave the bandage intact for twenty four hours unless there is drainage.  If the epidural site drains for more than 36-48 hours please call the anesthesia department.  QUESTIONS?:  Please feel free to call your physician or the hospital operator if you have any questions, and they will be happy to assist you.

## 2014-09-08 ENCOUNTER — Encounter (HOSPITAL_COMMUNITY): Payer: Self-pay

## 2014-09-08 ENCOUNTER — Encounter (HOSPITAL_COMMUNITY)
Admission: RE | Admit: 2014-09-08 | Discharge: 2014-09-08 | Disposition: A | Discharge status: Home / Self Care | Payer: Medicare Other | Source: Ambulatory Visit | Attending: Orthopedic Surgery | Admitting: Orthopedic Surgery

## 2014-09-08 DIAGNOSIS — M179 Osteoarthritis of knee, unspecified: Secondary | ICD-10-CM | POA: Diagnosis not present

## 2014-09-08 DIAGNOSIS — Z01818 Encounter for other preprocedural examination: Secondary | ICD-10-CM | POA: Insufficient documentation

## 2014-09-08 HISTORY — DX: Gastro-esophageal reflux disease without esophagitis: K21.9

## 2014-09-08 HISTORY — DX: Myoneural disorder, unspecified: G70.9

## 2014-09-08 HISTORY — DX: Major depressive disorder, single episode, unspecified: F32.9

## 2014-09-08 HISTORY — DX: Panic disorder (episodic paroxysmal anxiety): F41.0

## 2014-09-08 HISTORY — DX: Unspecified asthma, uncomplicated: J45.909

## 2014-09-08 LAB — CBC WITH DIFFERENTIAL/PLATELET
BASOS ABS: 0.1 10*3/uL (ref 0.0–0.1)
Basophils Relative: 2 % — ABNORMAL HIGH (ref 0–1)
Eosinophils Absolute: 0.3 10*3/uL (ref 0.0–0.7)
Eosinophils Relative: 5 % (ref 0–5)
HEMATOCRIT: 32.7 % — AB (ref 36.0–46.0)
Hemoglobin: 10.8 g/dL — ABNORMAL LOW (ref 12.0–15.0)
LYMPHS ABS: 1.4 10*3/uL (ref 0.7–4.0)
Lymphocytes Relative: 30 % (ref 12–46)
MCH: 31.5 pg (ref 26.0–34.0)
MCHC: 33 g/dL (ref 30.0–36.0)
MCV: 95.3 fL (ref 78.0–100.0)
Monocytes Absolute: 0.4 10*3/uL (ref 0.1–1.0)
Monocytes Relative: 9 % (ref 3–12)
NEUTROS PCT: 54 % (ref 43–77)
Neutro Abs: 2.5 10*3/uL (ref 1.7–7.7)
Platelets: 353 10*3/uL (ref 150–400)
RBC: 3.43 MIL/uL — ABNORMAL LOW (ref 3.87–5.11)
RDW: 12.6 % (ref 11.5–15.5)
WBC: 4.7 10*3/uL (ref 4.0–10.5)

## 2014-09-08 LAB — BASIC METABOLIC PANEL
Anion gap: 10 (ref 5–15)
BUN: 12 mg/dL (ref 6–20)
CO2: 29 mmol/L (ref 22–32)
Calcium: 9.1 mg/dL (ref 8.9–10.3)
Chloride: 101 mmol/L (ref 101–111)
Creatinine, Ser: 0.87 mg/dL (ref 0.44–1.00)
GFR calc Af Amer: >60 mL/min (ref >60)
GLUCOSE: 88 mg/dL (ref 65–99)
Potassium: 4 mmol/L (ref 3.5–5.1)
Sodium: 140 mmol/L (ref 135–145)

## 2014-09-08 LAB — PROTIME-INR
INR: 1.03 (ref 0.00–1.49)
PROTHROMBIN TIME: 13.7 s (ref 11.6–15.2)

## 2014-09-08 LAB — ABO/RH: ABO/RH(D): O POS

## 2014-09-08 LAB — SURGICAL PCR SCREEN
MRSA, PCR: NEGATIVE
STAPHYLOCOCCUS AUREUS: NEGATIVE

## 2014-09-08 LAB — APTT: APTT: 27 s (ref 24–37)

## 2014-09-09 LAB — PREPARE RBC (CROSSMATCH)

## 2014-09-09 NOTE — Progress Notes (Signed)
Pt's Hgb was 10.8, Hmt 32.7 on pre-op bloodwork for total knee replacement. Type & Crossmatch for 2 units is currently being processed in lab. Dr. Jayme Cloud made aware and no new orders given. He said he would notify Dr. Romeo Apple himself.

## 2014-09-13 MED ORDER — TRANEXAMIC ACID 1000 MG/10ML IV SOLN
1000.0000 mg | INTRAVENOUS | Status: AC
  Administered 2014-09-14: 1000 mg via INTRAVENOUS
  Filled 2014-09-13: qty 10

## 2014-09-13 NOTE — H&P (Signed)
TOTAL KNEE ADMISSION H&P  Patient is being admitted for left total knee arthroplasty.  Subjective:  Chief Complaint:left knee pain.  HPI: Alyssa Poole, 54 y.o. female, has a history of pain and functional disability in the knee. She had a knee arthroscopy she had a meniscectomy for recurrent effusions and torn medial meniscus. Postoperatively she continued to have pain swelling she had injections medications nothing helped. We did a lidocaine injection test most of her pain seemed to go away. It should be noted that she's had degenerative disc disease and lumbar disc problems in the past and we are not sure how much of her problem comes from her back. But we do know that the lidocaine injection test removed 85% of her pain    Patient Active Problem List   Diagnosis Date Noted  . Knee contusion 12/27/2013  . Medial meniscus, posterior horn derangement 12/27/2013  . Arthritis of knee, left 12/27/2013  . Left knee pain 12/27/2013  . 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 KNEE PAIN 05/11/2007  . DISC DEGENERATION 05/11/2007  . SCIATICA 05/11/2007   Past Medical History  Diagnosis Date  . Hypertension   . Anxiety   . Osteoporosis   . GERD (gastroesophageal reflux disease)   . Neuromuscular disorder   . Depression 2002  . Panic attacks 2002  . Asthma     Past Surgical History  Procedure Laterality Date  . Back surgery    . Cervical spine surgery    . Cholecystectomy    . Knee arthroscopy with medial menisectomy Left 03/09/2014    Procedure: LEFT KNEE ARTHROSCOPY WITH PARTIAL MEDIAL MENISECTOMY;  Surgeon: Vickki Hearing, MD;  Location: [facility];  Service: Orthopedics;  Laterality: Left;    No prescriptions prior to admission   Allergies  Allergen Reactions  . Bee Venom Swelling    Causes bad swelling. No epipen  needed    History  Substance Use Topics  . Smoking status: Former Smoker -- 1.50 packs/day for 35 years    Quit date: 03/03/2008  . Smokeless tobacco: Never  Used  . Alcohol Use: No    No family history on file.   ROS  Denies back pain other systems were reviewed and they were negative for current illness  Objective:  Physical Exam  Vitals reviewed. Constitutional: She is oriented to person, place, and time. She appears well-developed and well-nourished. No distress.  HENT:  Head: Normocephalic.  Eyes: Pupils are equal, round, and reactive to light.  Neck: Normal range of motion. Neck supple. No JVD present. No tracheal deviation present. No thyromegaly present.  Cardiovascular: Normal rate.   Respiratory: Effort normal.  GI: Soft.  Musculoskeletal:  The patient's upper extremities exhibit normal alignment, range of motion, stability, strength. Skin neurovascular exam intact  Left knee flexion painful at 115. She has full extension. She has medial joint line tenderness. She has painful range of motion and crepitance. The knee is otherwise stable motor exam is normal neurovascular exam is intact skin is clean without erythema  Right leg is normal  Lymphadenopathy:    She has no cervical adenopathy.  Neurological: She is alert and oriented to person, place, and time. She has normal reflexes. She displays normal reflexes. No cranial nerve deficit. She exhibits normal muscle tone. Coordination normal.  Skin: Skin is warm and dry. She is not diaphoretic.  Psychiatric: She has a normal mood and affect. Her behavior is normal. Judgment and thought content normal.    Vital signs in last  24 hours:    Labs:   Estimated body mass index is 25.51 kg/(m^2) as calculated from the following:   Height as of 08/24/14: 5\' 3"  (1.6 m).   Weight as of 08/24/14: 144 lb (65.318 kg).   Imaging Review Plain radiographs demonstrate moderate degenerative joint disease of the left knee(s). The overall alignment ismild varus. The bone quality appears to be good for age and reported activity level.  Assessment/Plan:  End stage arthritis, left knee    The patient history, physical examination, clinical judgment of the provider and imaging studies are consistent with end stage degenerative joint disease of the left knee(s) and total knee arthroplasty is deemed medically necessary. The treatment options including medical management, injection therapy arthroscopy and arthroplasty were discussed at length. The risks and benefits of total knee arthroplasty were presented and reviewed. The risks due to aseptic loosening, infection, stiffness, patella tracking problems, thromboembolic complications and other imponderables were discussed. The patient acknowledged the explanation, agreed to proceed with the plan and consent was signed. Patient is being admitted for inpatient treatment for surgery, pain control, PT, OT, prophylactic antibiotics, VTE prophylaxis, progressive ambulation and ADL's and discharge planning. The patient is planning to be discharged home with home health services  Left total knee

## 2014-09-14 ENCOUNTER — Inpatient Hospital Stay (HOSPITAL_COMMUNITY)
Admission: RE | Admit: 2014-09-14 | Discharge: 2014-09-16 | DRG: 470 | Disposition: A | Discharge status: Home Health | Payer: Medicare Other | Source: Ambulatory Visit | Attending: Orthopedic Surgery | Admitting: Orthopedic Surgery

## 2014-09-14 ENCOUNTER — Encounter (HOSPITAL_COMMUNITY): Payer: Self-pay | Admitting: *Deleted

## 2014-09-14 ENCOUNTER — Inpatient Hospital Stay (HOSPITAL_COMMUNITY): Payer: Medicare Other | Admitting: Anesthesiology

## 2014-09-14 ENCOUNTER — Inpatient Hospital Stay (HOSPITAL_COMMUNITY): Payer: Medicare Other

## 2014-09-14 ENCOUNTER — Encounter (HOSPITAL_COMMUNITY)
Admission: RE | Disposition: A | Discharge status: Home Health | Payer: Self-pay | Source: Ambulatory Visit | Attending: Orthopedic Surgery

## 2014-09-14 DIAGNOSIS — T404X5A Adverse effect of other synthetic narcotics, initial encounter: Secondary | ICD-10-CM | POA: Diagnosis not present

## 2014-09-14 DIAGNOSIS — M1712 Unilateral primary osteoarthritis, left knee: Principal | ICD-10-CM | POA: Diagnosis present

## 2014-09-14 DIAGNOSIS — J45909 Unspecified asthma, uncomplicated: Secondary | ICD-10-CM | POA: Diagnosis present

## 2014-09-14 DIAGNOSIS — M129 Arthropathy, unspecified: Secondary | ICD-10-CM

## 2014-09-14 DIAGNOSIS — K219 Gastro-esophageal reflux disease without esophagitis: Secondary | ICD-10-CM | POA: Diagnosis present

## 2014-09-14 DIAGNOSIS — I10 Essential (primary) hypertension: Secondary | ICD-10-CM | POA: Diagnosis present

## 2014-09-14 DIAGNOSIS — Z87891 Personal history of nicotine dependence: Secondary | ICD-10-CM | POA: Diagnosis not present

## 2014-09-14 DIAGNOSIS — M25562 Pain in left knee: Secondary | ICD-10-CM | POA: Diagnosis present

## 2014-09-14 DIAGNOSIS — T402X5A Adverse effect of other opioids, initial encounter: Secondary | ICD-10-CM | POA: Diagnosis not present

## 2014-09-14 DIAGNOSIS — M810 Age-related osteoporosis without current pathological fracture: Secondary | ICD-10-CM | POA: Diagnosis present

## 2014-09-14 DIAGNOSIS — M1990 Unspecified osteoarthritis, unspecified site: Secondary | ICD-10-CM

## 2014-09-14 DIAGNOSIS — R0681 Apnea, not elsewhere classified: Secondary | ICD-10-CM | POA: Diagnosis not present

## 2014-09-14 HISTORY — PX: TOTAL KNEE ARTHROPLASTY: SHX125

## 2014-09-14 SURGERY — ARTHROPLASTY, KNEE, TOTAL
Anesthesia: General | Site: Knee | Laterality: Left

## 2014-09-14 MED ORDER — BUPIVACAINE-EPINEPHRINE (PF) 0.5% -1:200000 IJ SOLN
INTRAMUSCULAR | Status: AC
  Filled 2014-09-14: qty 30

## 2014-09-14 MED ORDER — ONDANSETRON HCL 4 MG/2ML IJ SOLN: 4.0000 mg | Freq: Once | INTRAMUSCULAR | Status: DC | PRN

## 2014-09-14 MED ORDER — BUPIVACAINE LIPOSOME 1.3 % IJ SUSP
INTRAMUSCULAR | Status: AC
  Filled 2014-09-14: qty 20

## 2014-09-14 MED ORDER — SODIUM CHLORIDE 0.9 % IV SOLN
INTRAVENOUS | Status: DC
  Administered 2014-09-14 – 2014-09-15 (×3): via INTRAVENOUS

## 2014-09-14 MED ORDER — FENTANYL CITRATE (PF) 100 MCG/2ML IJ SOLN
25.0000 ug | INTRAMUSCULAR | Status: DC | PRN
  Administered 2014-09-14: 50 ug via INTRAVENOUS
  Administered 2014-09-14: 25 ug via INTRAVENOUS
  Administered 2014-09-14 (×2): 50 ug via INTRAVENOUS
  Filled 2014-09-14: qty 2

## 2014-09-14 MED ORDER — CEFAZOLIN SODIUM-DEXTROSE 2-3 GM-% IV SOLR
2.0000 g | INTRAVENOUS | Status: AC
  Administered 2014-09-14: 2 g via INTRAVENOUS
  Filled 2014-09-14: qty 50

## 2014-09-14 MED ORDER — ONDANSETRON HCL 4 MG/2ML IJ SOLN
4.0000 mg | Freq: Once | INTRAMUSCULAR | Status: AC
  Administered 2014-09-14: 4 mg via INTRAVENOUS
  Filled 2014-09-14: qty 2

## 2014-09-14 MED ORDER — PAROXETINE HCL 20 MG PO TABS
20.0000 mg | ORAL_TABLET | Freq: Every day | ORAL | Status: DC
  Administered 2014-09-15 – 2014-09-16 (×2): 20 mg via ORAL
  Filled 2014-09-14 (×2): qty 1

## 2014-09-14 MED ORDER — METOCLOPRAMIDE HCL 5 MG/ML IJ SOLN: 5.0000 mg | Freq: Three times a day (TID) | INTRAMUSCULAR | Status: DC | PRN

## 2014-09-14 MED ORDER — ONDANSETRON HCL 4 MG PO TABS: 4.0000 mg | ORAL_TABLET | Freq: Four times a day (QID) | ORAL | Status: DC | PRN

## 2014-09-14 MED ORDER — ALUM & MAG HYDROXIDE-SIMETH 200-200-20 MG/5ML PO SUSP
30.0000 mL | ORAL | Status: DC | PRN
Start: 2014-09-14 — End: 2014-09-16

## 2014-09-14 MED ORDER — DEXAMETHASONE SODIUM PHOSPHATE 4 MG/ML IJ SOLN
4.0000 mg | Freq: Once | INTRAMUSCULAR | Status: AC
  Administered 2014-09-14: 4 mg via INTRAVENOUS
  Filled 2014-09-14: qty 1

## 2014-09-14 MED ORDER — OXYCODONE HCL 5 MG PO TABS
5.0000 mg | ORAL_TABLET | ORAL | Status: DC | PRN
  Administered 2014-09-15: 5 mg via ORAL
  Filled 2014-09-14: qty 1

## 2014-09-14 MED ORDER — FENTANYL CITRATE (PF) 100 MCG/2ML IJ SOLN
INTRAMUSCULAR | Status: AC
  Filled 2014-09-14: qty 2

## 2014-09-14 MED ORDER — GLYCOPYRROLATE 0.2 MG/ML IJ SOLN
INTRAMUSCULAR | Status: DC | PRN
  Administered 2014-09-14: 0.6 mg via INTRAVENOUS

## 2014-09-14 MED ORDER — METOCLOPRAMIDE HCL 10 MG PO TABS: 5.0000 mg | ORAL_TABLET | Freq: Three times a day (TID) | ORAL | Status: DC | PRN

## 2014-09-14 MED ORDER — SUFENTANIL CITRATE 50 MCG/ML IV SOLN
INTRAVENOUS | Status: AC
  Filled 2014-09-14: qty 1

## 2014-09-14 MED ORDER — MIDAZOLAM HCL 2 MG/2ML IJ SOLN
1.0000 mg | INTRAMUSCULAR | Status: DC | PRN
Start: 2014-09-14 — End: 2014-09-14
  Administered 2014-09-14: 2 mg via INTRAVENOUS
  Filled 2014-09-14: qty 2

## 2014-09-14 MED ORDER — SODIUM CHLORIDE 0.9 % IJ SOLN
INTRAMUSCULAR | Status: AC
  Filled 2014-09-14: qty 10

## 2014-09-14 MED ORDER — CELECOXIB 400 MG PO CAPS
400.0000 mg | ORAL_CAPSULE | Freq: Once | ORAL | Status: AC
  Administered 2014-09-14: 400 mg via ORAL
  Filled 2014-09-14: qty 1

## 2014-09-14 MED ORDER — BISACODYL 10 MG RE SUPP: 10.0000 mg | Freq: Every day | RECTAL | Status: DC | PRN

## 2014-09-14 MED ORDER — OXYCODONE HCL 5 MG PO TABS
5.0000 mg | ORAL_TABLET | Freq: Once | ORAL | Status: AC
  Administered 2014-09-14: 5 mg via ORAL
  Filled 2014-09-14: qty 1

## 2014-09-14 MED ORDER — METHOCARBAMOL 1000 MG/10ML IJ SOLN
500.0000 mg | Freq: Once | INTRAVENOUS | Status: AC
  Administered 2014-09-14: 500 mg via INTRAVENOUS
  Filled 2014-09-14: qty 5

## 2014-09-14 MED ORDER — SUCCINYLCHOLINE CHLORIDE 20 MG/ML IJ SOLN
INTRAMUSCULAR | Status: DC | PRN
  Administered 2014-09-14: 100 mg via INTRAVENOUS

## 2014-09-14 MED ORDER — HYDROMORPHONE HCL 1 MG/ML IJ SOLN
0.5000 mg | INTRAMUSCULAR | Status: DC | PRN
  Administered 2014-09-14: 0.5 mg via INTRAVENOUS

## 2014-09-14 MED ORDER — PROPOFOL 10 MG/ML IV BOLUS
INTRAVENOUS | Status: AC
  Filled 2014-09-14: qty 20

## 2014-09-14 MED ORDER — SODIUM CHLORIDE 0.9 % IJ SOLN
INTRAMUSCULAR | Status: AC
  Filled 2014-09-14: qty 40

## 2014-09-14 MED ORDER — ROCURONIUM BROMIDE 50 MG/5ML IV SOLN
INTRAVENOUS | Status: AC
  Filled 2014-09-14: qty 1

## 2014-09-14 MED ORDER — PROPOFOL 10 MG/ML IV BOLUS
INTRAVENOUS | Status: DC | PRN
  Administered 2014-09-14: 130 mg via INTRAVENOUS

## 2014-09-14 MED ORDER — NALOXONE HCL 0.4 MG/ML IJ SOLN
INTRAMUSCULAR | Status: DC | PRN
  Administered 2014-09-14: 40 ug via INTRAVENOUS

## 2014-09-14 MED ORDER — SUFENTANIL CITRATE 50 MCG/ML IV SOLN
INTRAVENOUS | Status: DC | PRN
Start: 2014-09-14 — End: 2014-09-14
  Administered 2014-09-14: 20 ug via INTRAVENOUS
  Administered 2014-09-14: 10 ug via INTRAVENOUS
  Administered 2014-09-14: 20 ug via INTRAVENOUS

## 2014-09-14 MED ORDER — PREGABALIN 50 MG PO CAPS
50.0000 mg | ORAL_CAPSULE | Freq: Once | ORAL | Status: AC
  Administered 2014-09-14: 50 mg via ORAL
  Filled 2014-09-14: qty 1

## 2014-09-14 MED ORDER — ROCURONIUM BROMIDE 100 MG/10ML IV SOLN
INTRAVENOUS | Status: DC | PRN
  Administered 2014-09-14: 10 mg via INTRAVENOUS
  Administered 2014-09-14: 20 mg via INTRAVENOUS

## 2014-09-14 MED ORDER — LOSARTAN POTASSIUM 50 MG PO TABS
50.0000 mg | ORAL_TABLET | Freq: Every day | ORAL | Status: DC
  Administered 2014-09-15 – 2014-09-16 (×2): 50 mg via ORAL
  Filled 2014-09-14 (×2): qty 1

## 2014-09-14 MED ORDER — PANTOPRAZOLE SODIUM 40 MG PO TBEC
40.0000 mg | DELAYED_RELEASE_TABLET | Freq: Every day | ORAL | Status: DC
  Administered 2014-09-15 – 2014-09-16 (×2): 40 mg via ORAL
  Filled 2014-09-14 (×2): qty 1

## 2014-09-14 MED ORDER — KETOROLAC TROMETHAMINE 15 MG/ML IJ SOLN
15.0000 mg | Freq: Four times a day (QID) | INTRAMUSCULAR | Status: AC
  Administered 2014-09-14 – 2014-09-15 (×4): 15 mg via INTRAVENOUS
  Filled 2014-09-14 (×4): qty 1

## 2014-09-14 MED ORDER — LIDOCAINE HCL (PF) 1 % IJ SOLN
INTRAMUSCULAR | Status: AC
  Filled 2014-09-14: qty 5

## 2014-09-14 MED ORDER — ARTIFICIAL TEARS OP OINT
TOPICAL_OINTMENT | OPHTHALMIC | Status: DC | PRN
  Administered 2014-09-14: 1 via OPHTHALMIC

## 2014-09-14 MED ORDER — HYDROMORPHONE HCL 1 MG/ML IJ SOLN
INTRAMUSCULAR | Status: AC
  Filled 2014-09-14: qty 1

## 2014-09-14 MED ORDER — HYDROCHLOROTHIAZIDE 12.5 MG PO CAPS
12.5000 mg | ORAL_CAPSULE | Freq: Every day | ORAL | Status: DC
  Administered 2014-09-15 – 2014-09-16 (×2): 12.5 mg via ORAL
  Filled 2014-09-14 (×2): qty 1

## 2014-09-14 MED ORDER — ACETAMINOPHEN 10 MG/ML IV SOLN
INTRAVENOUS | Status: AC
  Filled 2014-09-14: qty 100

## 2014-09-14 MED ORDER — PNEUMOCOCCAL VAC POLYVALENT 25 MCG/0.5ML IJ INJ
0.5000 mL | INJECTION | INTRAMUSCULAR | Status: AC
  Administered 2014-09-15: 0.5 mL via INTRAMUSCULAR
  Filled 2014-09-14: qty 0.5

## 2014-09-14 MED ORDER — ONDANSETRON HCL 4 MG/2ML IJ SOLN: 4.0000 mg | Freq: Four times a day (QID) | INTRAMUSCULAR | Status: DC | PRN

## 2014-09-14 MED ORDER — ACETAMINOPHEN 10 MG/ML IV SOLN: 1000.0000 mg | Freq: Four times a day (QID) | INTRAVENOUS | Status: DC

## 2014-09-14 MED ORDER — LACTATED RINGERS IV SOLN
INTRAVENOUS | Status: DC
  Administered 2014-09-14: 1000 mL via INTRAVENOUS
  Administered 2014-09-14: 09:00:00 via INTRAVENOUS

## 2014-09-14 MED ORDER — CALCIUM CARBONATE-VITAMIN D 500-200 MG-UNIT PO TABS
ORAL_TABLET | Freq: Every morning | ORAL | Status: DC
  Administered 2014-09-14 – 2014-09-16 (×3): 1 via ORAL
  Filled 2014-09-14 (×3): qty 1

## 2014-09-14 MED ORDER — PHENOL 1.4 % MT LIQD: 1.0000 | OROMUCOSAL | Status: DC | PRN

## 2014-09-14 MED ORDER — MAGNESIUM CITRATE PO SOLN: 1.0000 | Freq: Once | ORAL | Status: AC | PRN

## 2014-09-14 MED ORDER — EPHEDRINE SULFATE 50 MG/ML IJ SOLN
INTRAMUSCULAR | Status: AC
  Filled 2014-09-14: qty 1

## 2014-09-14 MED ORDER — CHLORHEXIDINE GLUCONATE 4 % EX LIQD: 60.0000 mL | Freq: Once | CUTANEOUS | Status: DC

## 2014-09-14 MED ORDER — DOCUSATE SODIUM 100 MG PO CAPS
100.0000 mg | ORAL_CAPSULE | Freq: Two times a day (BID) | ORAL | Status: DC
  Administered 2014-09-14 – 2014-09-16 (×5): 100 mg via ORAL
  Filled 2014-09-14 (×5): qty 1

## 2014-09-14 MED ORDER — LIDOCAINE HCL (CARDIAC) 20 MG/ML IV SOLN
INTRAVENOUS | Status: DC | PRN
  Administered 2014-09-14: 50 mg via INTRAVENOUS

## 2014-09-14 MED ORDER — ACETAMINOPHEN 500 MG PO TABS
1000.0000 mg | ORAL_TABLET | Freq: Four times a day (QID) | ORAL | Status: AC
Start: 2014-09-14 — End: 2014-09-15
  Administered 2014-09-14 – 2014-09-15 (×4): 1000 mg via ORAL
  Filled 2014-09-14 (×4): qty 2

## 2014-09-14 MED ORDER — SUCCINYLCHOLINE CHLORIDE 20 MG/ML IJ SOLN
INTRAMUSCULAR | Status: AC
  Filled 2014-09-14: qty 1

## 2014-09-14 MED ORDER — ACETAMINOPHEN 10 MG/ML IV SOLN
1000.0000 mg | Freq: Once | INTRAVENOUS | Status: AC
  Administered 2014-09-14: 1000 mg via INTRAVENOUS

## 2014-09-14 MED ORDER — BUPIVACAINE LIPOSOME 1.3 % IJ SUSP
20.0000 mL | Freq: Once | INTRAMUSCULAR | Status: DC
  Filled 2014-09-14: qty 20

## 2014-09-14 MED ORDER — POLYETHYLENE GLYCOL 3350 17 G PO PACK
17.0000 g | PACK | Freq: Every day | ORAL | Status: DC
  Administered 2014-09-14 – 2014-09-16 (×3): 17 g via ORAL
  Filled 2014-09-14 (×3): qty 1

## 2014-09-14 MED ORDER — ASPIRIN EC 325 MG PO TBEC
325.0000 mg | DELAYED_RELEASE_TABLET | Freq: Every day | ORAL | Status: DC
  Administered 2014-09-15 – 2014-09-16 (×2): 325 mg via ORAL
  Filled 2014-09-14 (×2): qty 1

## 2014-09-14 MED ORDER — DIPHENHYDRAMINE HCL 12.5 MG/5ML PO ELIX: 12.5000 mg | ORAL_SOLUTION | ORAL | Status: DC | PRN

## 2014-09-14 MED ORDER — CEFAZOLIN SODIUM-DEXTROSE 2-3 GM-% IV SOLR
2.0000 g | Freq: Four times a day (QID) | INTRAVENOUS | Status: AC
  Administered 2014-09-14 (×2): 2 g via INTRAVENOUS
  Filled 2014-09-14 (×2): qty 50

## 2014-09-14 MED ORDER — BLACK COHOSH 175 MG PO CAPS: 175.0000 mg | ORAL_CAPSULE | Freq: Three times a day (TID) | ORAL | Status: DC

## 2014-09-14 MED ORDER — BUPIVACAINE-EPINEPHRINE (PF) 0.5% -1:200000 IJ SOLN
INTRAMUSCULAR | Status: DC | PRN
  Administered 2014-09-14: 30 mL via PERINEURAL

## 2014-09-14 MED ORDER — NEOSTIGMINE METHYLSULFATE 10 MG/10ML IV SOLN
INTRAVENOUS | Status: DC | PRN
  Administered 2014-09-14: 4 mg via INTRAVENOUS

## 2014-09-14 MED ORDER — LOSARTAN POTASSIUM-HCTZ 50-12.5 MG PO TABS: 1.0000 | ORAL_TABLET | Freq: Every day | ORAL | Status: DC

## 2014-09-14 MED ORDER — MENTHOL 3 MG MT LOZG
1.0000 | LOZENGE | OROMUCOSAL | Status: DC | PRN
Start: 2014-09-14 — End: 2014-09-16

## 2014-09-14 MED ORDER — NALOXONE HCL 0.4 MG/ML IJ SOLN
INTRAMUSCULAR | Status: AC
  Filled 2014-09-14: qty 1

## 2014-09-14 MED ORDER — BUPIVACAINE LIPOSOME 1.3 % IJ SUSP
INTRAMUSCULAR | Status: DC | PRN
  Administered 2014-09-14: 60 mL

## 2014-09-14 MED ORDER — SODIUM CHLORIDE 0.9 % IR SOLN
Status: DC | PRN
  Administered 2014-09-14: 3000 mL

## 2014-09-14 MED ORDER — SODIUM CHLORIDE 0.9 % IR SOLN
Status: DC | PRN
  Administered 2014-09-14: 1000 mL

## 2014-09-14 MED ORDER — LORAZEPAM 0.5 MG PO TABS
0.5000 mg | ORAL_TABLET | Freq: Every day | ORAL | Status: DC
  Administered 2014-09-14 – 2014-09-15 (×2): 0.5 mg via ORAL
  Filled 2014-09-14 (×2): qty 1

## 2014-09-14 SURGICAL SUPPLY — 67 items
BAG HAMPER (MISCELLANEOUS) ×3 IMPLANT
BANDAGE ESMARK 6X9 LF (GAUZE/BANDAGES/DRESSINGS) ×1 IMPLANT
BIT DRILL 3.2X128 (BIT) IMPLANT
BIT DRILL 3.2X128MM (BIT)
BLADE HEX COATED 2.75 (ELECTRODE) ×3 IMPLANT
BLADE SAG 18X100X1.27 (BLADE) IMPLANT
BLADE SAGITTAL 25.0X1.27X90 (BLADE) IMPLANT
BLADE SAGITTAL 25.0X1.27X90MM (BLADE)
BLADE SAW SAG 90X13X1.27 (BLADE) IMPLANT
BNDG ESMARK 6X9 LF (GAUZE/BANDAGES/DRESSINGS) ×3
BOWL SMART MIX CTS (DISPOSABLE) IMPLANT
CAP KNEE TOTAL 3 SIGMA ×3 IMPLANT
CEMENT HV SMART SET (Cement) ×3 IMPLANT
CLOTH BEACON ORANGE TIMEOUT ST (SAFETY) ×3 IMPLANT
COVER LIGHT HANDLE STERIS (MISCELLANEOUS) ×12 IMPLANT
COVER PROBE W GEL 5X96 (DRAPES) ×3 IMPLANT
CUFF TOURNIQUET SINGLE 34IN LL (TOURNIQUET CUFF) ×3 IMPLANT
CUFF TOURNIQUET SINGLE 44IN (TOURNIQUET CUFF) IMPLANT
DECANTER SPIKE VIAL GLASS SM (MISCELLANEOUS) ×6 IMPLANT
DRAPE BACK TABLE (DRAPES) ×3 IMPLANT
DRAPE EXTREMITY T 121X128X90 (DRAPE) ×3 IMPLANT
DRSG MEPILEX BORDER 4X12 (GAUZE/BANDAGES/DRESSINGS) ×3 IMPLANT
DURAPREP 26ML APPLICATOR (WOUND CARE) ×6 IMPLANT
ELECT REM PT RETURN 9FT ADLT (ELECTROSURGICAL) ×3
ELECTRODE REM PT RTRN 9FT ADLT (ELECTROSURGICAL) ×1 IMPLANT
EVACUATOR 3/16  PVC DRAIN (DRAIN) ×2
EVACUATOR 3/16 PVC DRAIN (DRAIN) ×1 IMPLANT
GLOVE BIOGEL M 7.0 STRL (GLOVE) ×9 IMPLANT
GLOVE BIOGEL PI IND STRL 7.0 (GLOVE) ×3 IMPLANT
GLOVE BIOGEL PI INDICATOR 7.0 (GLOVE) ×6
GLOVE EXAM NITRILE MD LF STRL (GLOVE) ×9 IMPLANT
GLOVE OPTIFIT SS 8.0 STRL (GLOVE) ×3 IMPLANT
GLOVE SKINSENSE NS SZ8.0 LF (GLOVE) ×4
GLOVE SKINSENSE STRL SZ8.0 LF (GLOVE) ×2 IMPLANT
GLOVE SS N UNI LF 8.5 STRL (GLOVE) ×3 IMPLANT
GOWN STRL REUS W/TWL LRG LVL3 (GOWN DISPOSABLE) ×12 IMPLANT
GOWN STRL REUS W/TWL XL LVL3 (GOWN DISPOSABLE) ×3 IMPLANT
HANDPIECE INTERPULSE COAX TIP (DISPOSABLE) ×2
HOOD W/PEELAWAY (MISCELLANEOUS) ×12 IMPLANT
INST SET MAJOR BONE (KITS) ×3 IMPLANT
IV NS IRRIG 3000ML ARTHROMATIC (IV SOLUTION) ×3 IMPLANT
KIT BLADEGUARD II DBL (SET/KITS/TRAYS/PACK) ×3 IMPLANT
KIT ROOM TURNOVER APOR (KITS) ×3 IMPLANT
MANIFOLD NEPTUNE II (INSTRUMENTS) ×3 IMPLANT
MARKER SKIN DUAL TIP RULER LAB (MISCELLANEOUS) ×3 IMPLANT
NEEDLE HYPO 21X1.5 SAFETY (NEEDLE) IMPLANT
NEEDLE HYPO 25X1 1.5 SAFETY (NEEDLE) ×3 IMPLANT
NS IRRIG 1000ML POUR BTL (IV SOLUTION) ×3 IMPLANT
PACK TOTAL JOINT (CUSTOM PROCEDURE TRAY) ×3 IMPLANT
PAD ARMBOARD 7.5X6 YLW CONV (MISCELLANEOUS) ×3 IMPLANT
PAD DANNIFLEX CPM (ORTHOPEDIC SUPPLIES) ×3 IMPLANT
PIN TROCAR 3 INCH (PIN) ×3 IMPLANT
SAW OSC TIP CART 19.5X105X1.3 (SAW) ×3 IMPLANT
SET BASIN LINEN APH (SET/KITS/TRAYS/PACK) ×3 IMPLANT
SET HNDPC FAN SPRY TIP SCT (DISPOSABLE) ×1 IMPLANT
STAPLER VISISTAT 35W (STAPLE) ×3 IMPLANT
SUT BRALON NAB BRD #1 30IN (SUTURE) ×6 IMPLANT
SUT MON AB 0 CT1 (SUTURE) ×6 IMPLANT
SUT MON AB 2-0 CT1 36 (SUTURE) IMPLANT
SYR 20CC LL (SYRINGE) IMPLANT
SYR 30ML LL (SYRINGE) ×3 IMPLANT
SYR BULB IRRIGATION 50ML (SYRINGE) ×3 IMPLANT
TOWEL OR 17X26 4PK STRL BLUE (TOWEL DISPOSABLE) ×3 IMPLANT
TOWER CARTRIDGE SMART MIX (DISPOSABLE) ×3 IMPLANT
TRAY FOLEY CATH SILVER 16FR (SET/KITS/TRAYS/PACK) ×3 IMPLANT
WATER STERILE IRR 1000ML POUR (IV SOLUTION) ×12 IMPLANT
YANKAUER SUCT 12FT TUBE ARGYLE (SUCTIONS) ×3 IMPLANT

## 2014-09-14 NOTE — Interval H&P Note (Signed)
History and Physical Interval Note:  09/14/2014 7:20 AM  Alyssa Poole  has presented today for surgery, with the diagnosis of LEFT KNEE OSTEOARTHRITIS  The various methods of treatment have been discussed with the patient and family. After consideration of risks, benefits and other options for treatment, the patient has consented to  Procedure(s): LEFT TOTAL KNEE ARTHROPLASTY as a surgical intervention .  The patient's history has been reviewed, patient examined, no change in status, stable for surgery.  I have reviewed the patient's chart and labs.  Questions were answered to the patient's satisfaction.     [name]

## 2014-09-14 NOTE — Anesthesia Preprocedure Evaluation (Signed)
Anesthesia Evaluation  Patient identified by MRN, date of birth, ID band Patient awake    Reviewed: Allergy & Precautions, H&P , NPO status   History of Anesthesia Complications Negative for: history of anesthetic complications  Airway Mallampati: II  TM Distance: >3 FB     Dental  (+) Partial Upper, Partial Lower, Poor Dentition, Dental Advisory Given   Pulmonary COPDformer smoker,  breath sounds clear to auscultation        Cardiovascular hypertension, Pt. on medications Rhythm:Regular Rate:Normal     Neuro/Psych PSYCHIATRIC DISORDERS Anxiety  Neuromuscular disease    GI/Hepatic GERD-  Medicated,  Endo/Other    Renal/GU      Musculoskeletal  (+) Arthritis -,   Abdominal   Peds  Hematology   Anesthesia Other Findings   Reproductive/Obstetrics                             Anesthesia Physical Anesthesia Plan  ASA: III  Anesthesia Plan: General   Post-op Pain Management:    Induction: Intravenous, Rapid sequence and Cricoid pressure planned  Airway Management Planned: Oral ETT  Additional Equipment:   Intra-op Plan:   Post-operative Plan: Extubation in OR  Informed Consent: I have reviewed the patients History and Physical, chart, labs and discussed the procedure including the risks, benefits and alternatives for the proposed anesthesia with the patient or authorized representative who has indicated his/her understanding and acceptance.     Plan Discussed with:   Anesthesia Plan Comments:         Anesthesia Quick Evaluation

## 2014-09-14 NOTE — Anesthesia Postprocedure Evaluation (Signed)
  Anesthesia Post-op Note  Patient: Alyssa Poole  Procedure(s) Performed: Procedure(s): LEFT TOTAL KNEE ARTHROPLASTY (Left)  Patient Location: PACU  Anesthesia Type:General  Level of Consciousness: sedated and patient cooperative  Airway and Oxygen Therapy: Patient Spontanous Breathing and non-rebreather face mask  Post-op Pain: denies  Post-op Assessment: Post-op Vital signs reviewed, Patient's Cardiovascular Status Stable, Respiratory Function Stable and Patent Airway              Post-op Vital Signs: Reviewed and stable  Last Vitals:  Filed Vitals:   09/14/14 1130  BP: 129/65  Pulse: 92  Temp:   Resp: 20    Complications: No apparent anesthesia complications

## 2014-09-14 NOTE — Transfer of Care (Signed)
Immediate Anesthesia Transfer of Care Note  Patient: Alyssa Poole  Procedure(s) Performed: Procedure(s): LEFT TOTAL KNEE ARTHROPLASTY (Left)  Patient Location: PACU  Anesthesia Type:General  Level of Consciousness: sedated and patient cooperative  Airway & Oxygen Therapy: Patient Spontanous Breathing and Patient connected to face mask oxygen  Post-op Assessment: Report given to RN and Post -op Vital signs reviewed and stable  Post vital signs: Reviewed and stable  Last Vitals:  Filed Vitals:   09/14/14 0720  BP: 122/76  Temp:   Resp: 27    Complications: No apparent anesthesia complications

## 2014-09-14 NOTE — Addendum Note (Signed)
Addendum  created 09/14/14 1111 by Earleen Newport, CRNA   Modules edited: Charges VN

## 2014-09-14 NOTE — Anesthesia Postprocedure Evaluation (Signed)
  Anesthesia Post-op Note  Patient: Alyssa Poole  Procedure(s) Performed: Procedure(s): LEFT TOTAL KNEE ARTHROPLASTY (Left)  Patient Location: PACU  Anesthesia Type:General  Level of Consciousness: lethargic and obtunded  Airway and Oxygen Therapy: Ambu bag  Post-op Pain: unable to assess  Post-op Assessment: Patient's Cardiovascular Status Stable               Complications:  Patient obtunded. Saturation decreasing. Dr. Jayme Cloud called. Oral airway inserted. Assisted ventilations with Ambu. Narcan 40 mcg IV given. 1127 Patient arouses easily.

## 2014-09-14 NOTE — Anesthesia Postprocedure Evaluation (Signed)
  Anesthesia Post-op Note  Patient: Alyssa Poole  Procedure(s) Performed: Procedure(s): LEFT TOTAL KNEE ARTHROPLASTY (Left)  Patient Location: PACU  Anesthesia Type:General  Level of Consciousness: sleepy, arouses easily  Airway and Oxygen Therapy: Patient Spontanous Breathing and non-rebreather face mask  Post-op Pain: mild  Post-op Assessment: Post-op Vital signs reviewed, Patient's Cardiovascular Status Stable, Respiratory Function Stable and Patent Airway              Post-op Vital Signs: Reviewed and stable  Last Vitals:  Filed Vitals:   09/14/14 1015  BP: 124/64  Pulse: 88  Temp:   Resp: 19    Complications: No apparent anesthesia complications

## 2014-09-14 NOTE — Op Note (Signed)
09/14/2014  9:21 AM  PATIENT:  Alyssa Poole  54 y.o. female  PRE-OPERATIVE DIAGNOSIS:  LEFT KNEE OSTEOARTHRITIS  POST-OPERATIVE DIAGNOSIS:  LEFT KNEE OSTEOARTHRITIS  PROCEDURE:  Procedure(s): LEFT TOTAL KNEE ARTHROPLASTY (Left)  SURGEON:  Surgeon(s) and Role:    * Vickki Hearing, MD - Primary  PHYSICIAN ASSISTANT:   ASSISTANTS: BETTY ASHLEY AND KATHERINE PAGE    ANESTHESIA:   general  EBL:  Total I/O In: 1000 [I.V.:1000] Out: 105 [Urine:100; Blood:5]  BLOOD ADMINISTERED:none  DRAINS: 1 HEMOVAC    LOCAL MEDICATIONS USED:  MARCAINE   , Amount: 30 WITH EPI  ml and OTHER EXPAREL 20 DILUTED WITH 40 CC SALINE   SPECIMEN:  No Specimen  DISPOSITION OF SPECIMEN:  N/A  COUNTS:  YES  TOURNIQUET:   Total Tourniquet Time Documented: Thigh (Left) - 76 minutes Total: Thigh (Left) - 76 minutes   DICTATION: .Reubin Milan Dictation  PLAN OF CARE: Admit to inpatient   PATIENT DISPOSITION:  PACU - hemodynamically stable.   Delay start of Pharmacological VTE agent (>24hrs) due to surgical blood loss or risk of bleeding: not applicable  Our patient Alyssa Poole was identified in the preop holding area. Surgical site was confirmed. Surgical site marked. Chart review completed. Patient taken to surgery. General anesthesia was administered without complication. 2 g of Ancef were started per SCIP protocol. Foley catheter was inserted. Patient was in the supine position. The the left leg operative leg was prepped from the groin to the toes with chlor prep. The limb was then draped sterilely.  Timeout was executed and the surgical team agreed on the surgical site, implants in place and ready for use. X-rays available and reviewed.  The limb was exsanguinated with a six-inch Esmarch. The tourniquet was elevated to 300 mmHg. The knee was placed in flexion and a midline incision was made. The extensor mechanism was identified and a medial arthrotomy was performed. The patella was  everted and the patellofemoral ligament was released. The medial soft tissue sleeve was elevated to the mid coronal plane. The anterior cruciate ligament PCL medial lateral menisci were resected. The patella fat pad was resected.  This gave excellent exposure. The center of the femoral canal was opened with a drill and decompressed with suction irrigation. The femoral guide was set for 10 mm distal resection 5 valgus angle for left knee. After the cutting block was pinned in place 10 mm of the distal femur was resected giving a nice figure-of-eight configuration to the distal femur exposed bone. The femur was sized to a size 3 and pinned in external rotation to match the epicondyles.  4-in-1 cutting block was used to make the 4 distal femoral cuts.  The external alignment guide was placed for the tibial resection. The higher lateral side was used for reference and the block was pinned for a 8 mm resection. The proximal tibia was resected and measured with 4 mm removed from the medial side and 11 mm from the lateral side.  Spacer blocks were placed. The knee was balanced with a 10 mm spacer block no further resections were needed or releases were needed.  The notch was cut with a 3 femoral notch cutting guide pinned in place. Trial reduction was done with a 3 femur and a 2.5 tibia. Excellent range of motion including full extension and flexion without liftoff was noted with a 10 mm spacer.  The patella was measured to be 25 mm and it was resected down to 12 mm and  a 35 mm patellar button was placed with a thickness of 8.5 mm.  We then punched the tibia and prepared it per manufacture technique.  Identified all the implants they were opened. The cement was mixed on the back table. The cement was applied to the bone and to the prosthesis the prosthesis was placed and the cement was allowed to cure excess cement was removed.  Passive flexion test was 110 the knee came to full extension easily patella  tracked normally. The knee was irrigated. A drain was placed. The extensor mechanism was closed with #1 Bralon suture. The subcutaneous tissue was closed with 0 Monocryl suture and the skin was reapproximated with staples  We injected exparel prior to cementing and we injected Marcaine once the extensor mechanism was closed  We placed a sterile dressing and a TED hose. We released the tourniquet we got good flow of the limb  The patient was extubated taken recovery room in stable condition

## 2014-09-14 NOTE — Anesthesia Procedure Notes (Signed)
Procedure Name: Intubation Date/Time: 09/14/2014 7:34 AM Performed by: Pernell Dupre, AMY A Pre-anesthesia Checklist: Patient identified, Patient being monitored, Timeout performed, Emergency Drugs available and Suction available Patient Re-evaluated:Patient Re-evaluated prior to inductionOxygen Delivery Method: Circle System Utilized Preoxygenation: Pre-oxygenation with 100% oxygen Intubation Type: IV induction, Rapid sequence and Cricoid Pressure applied Laryngoscope Size: 3 and Miller Grade View: Grade I Tube type: Oral Tube size: 7.0 mm Number of attempts: 1 Airway Equipment and Method: Stylet Placement Confirmation: ETT inserted through vocal cords under direct vision,  positive ETCO2 and breath sounds checked- equal and bilateral Secured at: 21 cm Tube secured with: Tape Dental Injury: Teeth and Oropharynx as per pre-operative assessment

## 2014-09-14 NOTE — Addendum Note (Signed)
Addendum  created 09/14/14 1138 by Franco Nones, CRNA   Modules edited: Anesthesia Medication Administration, Notes Section   Notes Section:  File: [id number]

## 2014-09-14 NOTE — Brief Op Note (Signed)
09/14/2014  9:21 AM  PATIENT:  Alyssa Poole  53 y.o. female  PRE-OPERATIVE DIAGNOSIS:  LEFT KNEE OSTEOARTHRITIS  POST-OPERATIVE DIAGNOSIS:  LEFT KNEE OSTEOARTHRITIS  PROCEDURE:  Procedure(s): LEFT TOTAL KNEE ARTHROPLASTY (Left)  SURGEON:  Surgeon(s) and Role:    * Laney Bagshaw E Kijuan Gallicchio, MD - Primary  PHYSICIAN ASSISTANT:   ASSISTANTS: BETTY ASHLEY AND KATHERINE PAGE    ANESTHESIA:   general  EBL:  Total I/O In: 1000 [I.V.:1000] Out: 105 [Urine:100; Blood:5]  BLOOD ADMINISTERED:none  DRAINS: 1 HEMOVAC    LOCAL MEDICATIONS USED:  MARCAINE   , Amount: 30 WITH EPI  ml and OTHER EXPAREL 20 DILUTED WITH 40 CC SALINE   SPECIMEN:  No Specimen  DISPOSITION OF SPECIMEN:  N/A  COUNTS:  YES  TOURNIQUET:   Total Tourniquet Time Documented: Thigh (Left) - 76 minutes Total: Thigh (Left) - 76 minutes   DICTATION: .Dragon Dictation  PLAN OF CARE: Admit to inpatient   PATIENT DISPOSITION:  PACU - hemodynamically stable.   Delay start of Pharmacological VTE agent (>24hrs) due to surgical blood loss or risk of bleeding: not applicable  Our patient Alyssa Poole was identified in the preop holding area. Surgical site was confirmed. Surgical site marked. Chart review completed. Patient taken to surgery. General anesthesia was administered without complication. 2 g of Ancef were started per SCIP protocol. Foley catheter was inserted. Patient was in the supine position. The the left leg operative leg was prepped from the groin to the toes with chlor prep. The limb was then draped sterilely.  Timeout was executed and the surgical team agreed on the surgical site, implants in place and ready for use. X-rays available and reviewed.  The limb was exsanguinated with a six-inch Esmarch. The tourniquet was elevated to 300 mmHg. The knee was placed in flexion and a midline incision was made. The extensor mechanism was identified and a medial arthrotomy was performed. The patella was  everted and the patellofemoral ligament was released. The medial soft tissue sleeve was elevated to the mid coronal plane. The anterior cruciate ligament PCL medial lateral menisci were resected. The patella fat pad was resected.  This gave excellent exposure. The center of the femoral canal was opened with a drill and decompressed with suction irrigation. The femoral guide was set for 10 mm distal resection 5 valgus angle for left knee. After the cutting block was pinned in place 10 mm of the distal femur was resected giving a nice figure-of-eight configuration to the distal femur exposed bone. The femur was sized to a size 3 and pinned in external rotation to match the epicondyles.  4-in-1 cutting block was used to make the 4 distal femoral cuts.  The external alignment guide was placed for the tibial resection. The higher lateral side was used for reference and the block was pinned for a 8 mm resection. The proximal tibia was resected and measured with 4 mm removed from the medial side and 11 mm from the lateral side.  Spacer blocks were placed. The knee was balanced with a 10 mm spacer block no further resections were needed or releases were needed.  The notch was cut with a 3 femoral notch cutting guide pinned in place. Trial reduction was done with a 3 femur and a 2.5 tibia. Excellent range of motion including full extension and flexion without liftoff was noted with a 10 mm spacer.  The patella was measured to be 25 mm and it was resected down to 12 mm and   a 35 mm patellar button was placed with a thickness of 8.5 mm.  We then punched the tibia and prepared it per manufacture technique.  Identified all the implants they were opened. The cement was mixed on the back table. The cement was applied to the bone and to the prosthesis the prosthesis was placed and the cement was allowed to cure excess cement was removed.  Passive flexion test was 110 the knee came to full extension easily patella  tracked normally. The knee was irrigated. A drain was placed. The extensor mechanism was closed with #1 Bralon suture. The subcutaneous tissue was closed with 0 Monocryl suture and the skin was reapproximated with staples  We injected exparel prior to cementing and we injected Marcaine once the extensor mechanism was closed  We placed a sterile dressing and a TED hose. We released the tourniquet we got good flow of the limb  The patient was extubated taken recovery room in stable condition  

## 2014-09-14 NOTE — Progress Notes (Signed)
The patient is receiving acetaminophen by the intravenous route.  Based on criteria approved by the Pharmacy and Therapeutics Committee and the Medical Executive Committee, the medication is being converted to the equivalent oral dose form.  These criteria include: -No Active GI bleeding -Able to tolerate diet of full liquids (or better) or tube feeding OR able to tolerate other medications by the oral or enteral route  If you have any questions about this conversion, please contact the Pharmacy Department (ext [phone]).  Thank you.  PHARMACIST - PHYSICIAN ORDER COMMUNICATION  CONCERNING: P&T Medication Policy on Herbal Medications  DESCRIPTION:  This patient's order for:  Black cohosh  has been noted.  This product(s) is classified as an "herbal" or natural product. Due to a lack of definitive safety studies or FDA approval, nonstandard manufacturing practices, plus the potential risk of unknown drug-drug interactions while on inpatient medications, the Pharmacy and Therapeutics Committee does not permit the use of "herbal" or natural products of this type within [facility].   ACTION TAKEN: The pharmacy department is unable to verify this order at this time and your patient has been informed of this safety policy. Please reevaluate patient's clinical condition at discharge and address if the herbal or natural product(s) should be resumed at that time.   Elson Clan, [facility] 09/14/2014 1:48 PM

## 2014-09-14 NOTE — Progress Notes (Signed)
PT Cancellation Note  Patient Details Name: Alyssa Poole MRN: [id number] DOB: [birth year]   Cancelled Treatment:    Reason Eval/Treat Not Completed: Patient's level of consciousness;Pain limiting ability to participate   Konrad Penta 09/14/2014, 2:54 PM

## 2014-09-14 NOTE — Addendum Note (Signed)
Addendum  created 09/14/14 1142 by Franco Nones, CRNA   Modules edited: Notes Section   Notes Section:  File: [id number]

## 2014-09-15 ENCOUNTER — Encounter (HOSPITAL_COMMUNITY): Payer: Self-pay | Admitting: Orthopedic Surgery

## 2014-09-15 DIAGNOSIS — M1712 Unilateral primary osteoarthritis, left knee: Secondary | ICD-10-CM | POA: Diagnosis not present

## 2014-09-15 LAB — BASIC METABOLIC PANEL
Anion gap: 6 (ref 5–15)
BUN: 12 mg/dL (ref 6–20)
CHLORIDE: 103 mmol/L (ref 101–111)
CO2: 31 mmol/L (ref 22–32)
Calcium: 8.6 mg/dL — ABNORMAL LOW (ref 8.9–10.3)
Creatinine, Ser: 0.85 mg/dL (ref 0.44–1.00)
GFR calc non Af Amer: >60 mL/min (ref >60)
Glucose, Bld: 116 mg/dL — ABNORMAL HIGH (ref 65–99)
Potassium: 3.5 mmol/L (ref 3.5–5.1)
Sodium: 140 mmol/L (ref 135–145)

## 2014-09-15 LAB — CBC
HEMATOCRIT: 27.5 % — AB (ref 36.0–46.0)
Hemoglobin: 9.2 g/dL — ABNORMAL LOW (ref 12.0–15.0)
MCH: 32.1 pg (ref 26.0–34.0)
MCHC: 33.5 g/dL (ref 30.0–36.0)
MCV: 95.8 fL (ref 78.0–100.0)
Platelets: 315 10*3/uL (ref 150–400)
RBC: 2.87 MIL/uL — AB (ref 3.87–5.11)
RDW: 12.7 % (ref 11.5–15.5)
WBC: 10.1 10*3/uL (ref 4.0–10.5)

## 2014-09-15 MED ORDER — KETOROLAC TROMETHAMINE 30 MG/ML IJ SOLN
30.0000 mg | Freq: Four times a day (QID) | INTRAMUSCULAR | Status: DC
  Administered 2014-09-15 – 2014-09-16 (×6): 30 mg via INTRAVENOUS
  Filled 2014-09-15 (×6): qty 1

## 2014-09-15 NOTE — Anesthesia Postprocedure Evaluation (Signed)
  Anesthesia Post-op Note  Patient: Alyssa Poole  Procedure(s) Performed: Procedure(s): LEFT TOTAL KNEE ARTHROPLASTY (Left)  Patient Location: Nursing Unit  Anesthesia Type:General  Level of Consciousness: awake, alert  and oriented  Airway and Oxygen Therapy: Patient Spontanous Breathing  Post-op Pain: mild  Post-op Assessment: Post-op Vital signs reviewed, Patient's Cardiovascular Status Stable, Respiratory Function Stable, Patent Airway and No signs of Nausea or vomiting              Post-op Vital Signs: Reviewed and stable  Last Vitals:  Filed Vitals:   09/15/14 1000  BP: 131/62  Pulse: 51  Temp: 36.9 C  Resp: 18    Complications: No apparent anesthesia complications

## 2014-09-15 NOTE — Progress Notes (Signed)
OT Screen  Patient Details Name: Alyssa Poole MRN: [id number] DOB: 20-Aug-1960   OT Screen:    Reason evaluation not completed: Pt screened for OT needs. Pt demonstrates WNL BUE range of motion and strength. Pt reports her boyfriend will be assisting her with BADLs for the immediate future until she is able to complete these independently. Educated pt on available assistive devices for dressing and bathing. No further OT needs at this time.   Ezra Sites, OTR/L  [phone]  09/15/2014, 11:52 AM

## 2014-09-15 NOTE — Care Management Note (Signed)
Case Management Note  Patient Details  Name: Alyssa Poole MRN: [id number] Date of Birth: 09/08/1960  Subjective/Objective:                  Pt admitted from home s/p total knee. Pt lives with family and will return home at discharge. Pt had been independent with ADl's.  Action/Plan: Pt would like DME and HH PT with [facility]. Will make referral to [facility] and continue to follow for discharge planning needs.  Expected Discharge Date:                  Expected Discharge Plan:  Home w Home Health Services  In-House Referral:  NA  Discharge planning Services  CM Consult  Post Acute Care Choice:  Durable Medical Equipment, Home Health Choice offered to:  Patient  DME Arranged:  CPM, Walker rolling, Bedside commode DME Agency:  [facility]  HH Arranged:  PT [facility] Agency:  [facility]  Status of Service:  In process, will continue to follow  Medicare Important Message Given:    Date Medicare IM Given:    Medicare IM give by:    Date Additional Medicare IM Given:    Additional Medicare Important Message give by:     If discussed at Long Length of Stay Meetings, dates discussed:    Additional Comments:  Cheryl Flash, RN 09/15/2014, 2:10 PM

## 2014-09-15 NOTE — Clinical Social Work Note (Signed)
CSW received consult for possible SNF. Per PT, pt is doing well and anticipate return home with home health. CSW will sign off, but can be reconsulted if needed.  Derenda Fennel, LCSW [phone]

## 2014-09-15 NOTE — Evaluation (Signed)
Physical Therapy Evaluation Patient Details Name: Alyssa Poole MRN: [id number] DOB: 08/14/1960 Today's Date: 09/15/2014   History of Present Illness  Pt underwent left TKR 09-14-14.  She is normally independent with all ADLs, lives with significant other and her son.  Clinical Impression  Pt was seen for eval/tx.  She was medicated for pain just prior to my visit and pain was well controlled.  She did not attend the total joint class.  She was fully instructed in the plan and goals of care.  There was minimal edema noted in the left knee.  AA left knee ROM= 5-68 degrees.  She is able to do an independent SLR and able to transfer to EOB independently.  She was instructed in bed to [facility] transfer needing only SBA.  She was instructed in gait with a walker and able to ambulate 15' with a stable gait pattern.  She is doing extremely well but tends to be very anxious.  She should be able to transition to home at d/c.    Follow Up Recommendations Home health PT    Equipment Recommendations  Rolling walker with 5" wheels    Recommendations for Other Services   none    Precautions / Restrictions Precautions Precautions: Knee Precaution Booklet Issued: No Restrictions LLE Weight Bearing: Weight bearing as tolerated      Mobility  Bed Mobility Overal bed mobility: Modified Independent                Transfers Overall transfer level: Needs assistance Equipment used: Rolling walker (2 wheeled) Transfers: Sit to/from Stand Sit to Stand: Supervision            Ambulation/Gait Ambulation/Gait assistance: Supervision Ambulation Distance (Feet): 15 Feet Assistive device: Rolling walker (2 wheeled) Gait Pattern/deviations: Antalgic   Gait velocity interpretation: at or above normal speed for age/gender                        Balance Overall balance assessment: Independent                                           Pertinent Vitals/Pain Pain  Assessment: No/denies pain    Home Living Family/patient expects to be discharged to:: Private residence Living Arrangements: Spouse/significant other;Children Available Help at Discharge: Family Type of Home: Mobile home Home Access: Stairs to enter   Secretary/administrator of Steps: 1 Home Layout: One level Home Equipment: Cane - single point      Prior Function Level of Independence: Independent                       Extremity/Trunk Assessment               Lower Extremity Assessment: LLE deficits/detail   LLE Deficits / Details: AA ROM of the left knee= 5-68 degrees,,,she is able to do a SLR independently  Cervical / Trunk Assessment: Normal  Communication   Communication: No difficulties  Cognition Arousal/Alertness: Awake/alert Behavior During Therapy: Anxious Overall Cognitive Status: Within Functional Limits for tasks assessed                            Exercises Total Joint Exercises Ankle Circles/Pumps: AROM;Both;10 reps;Supine Quad Sets: AROM;Both;10 reps;Supine Short Arc Quad: AAROM;Left;10 reps Heel Slides: AAROM;Left;10 reps;Supine  Assessment/Plan    PT Assessment Patient needs continued PT services  PT Diagnosis Difficulty walking;Generalized weakness;Acute pain   PT Problem List Decreased strength;Decreased range of motion;Decreased activity tolerance;Decreased mobility;Decreased knowledge of use of DME;Decreased knowledge of precautions;Pain  PT Treatment Interventions DME instruction;Gait training;Stair training;Functional mobility training;Therapeutic exercise;Patient/family education   PT Goals (Current goals can be found in the Care Plan section) Acute Rehab PT Goals Patient Stated Goal: full function of the left knee PT Goal Formulation: With patient Time For Goal Achievement: 09/29/14 Potential to Achieve Goals: Good    Frequency BID   Barriers to discharge   none    Co-evaluation                End of Session Equipment Utilized During Treatment: Gait belt Activity Tolerance: Patient tolerated treatment well Patient left: in chair;with call bell/phone within reach Nurse Communication: Mobility status         Time: 0918-1020 PT Time Calculation (min) (ACUTE ONLY): 62 min   Charges:   PT Evaluation $Initial PT Evaluation Tier I: 1 Procedure PT Treatments $Therapeutic Exercise: 8-22 mins   PT G CodesKonrad Penta PT 09/15/2014, 11:19 AM [phone]

## 2014-09-15 NOTE — Addendum Note (Signed)
Addendum  created 09/15/14 1325 by Moshe Salisbury, CRNA   Modules edited: Notes Section   Notes Section:  File: [id number]

## 2014-09-15 NOTE — Progress Notes (Signed)
Physical Therapy Treatment Patient Details Name: Alyssa Poole MRN: [id number] DOB: 10/26/1960 Today's Date: 09/15/2014    History of Present Illness Pt underwent left TKR 09-14-14.  She is normally independent with all ADLs, lives with significant other and her son.    PT Comments    Pt is progressing well.  Left knee ROM= 5-82 degrees, AA.  She was able to ambulate 125' with a walker, nearly full weight bearing left.  She was having more pain in the knee this afternoon, RN was alerted.  I instructed the CNA in how to fill and operate the cryocuff and this has been maintained all day.  The CPM was set up and it was placed in the bed moving from 0-60 degrees.  She was very comfortable when I left her.  RN was alerted to have it removed between 8-10 PM tonight.  Pt is easily upset and cries easily but seemed very calm when I left her.  Follow Up Recommendations  Home health PT     Equipment Recommendations  Rolling walker with 5" wheels    Recommendations for Other Services  none     Precautions / Restrictions Precautions Precautions: Knee Precaution Booklet Issued: No Restrictions LLE Weight Bearing: Weight bearing as tolerated    Mobility  Bed Mobility Overal bed mobility: Needs Assistance Bed Mobility: Sit to Supine       Sit to supine: Min assist   General bed mobility comments: due to fatigue, pt needed assist to bring LLE into the bed  Transfers Overall transfer level: Needs assistance Equipment used: Rolling walker (2 wheeled) Transfers: Sit to/from Stand Sit to Stand: Supervision            Ambulation/Gait Ambulation/Gait assistance: Supervision Ambulation Distance (Feet): 125 Feet Assistive device: Rolling walker (2 wheeled) Gait Pattern/deviations: Antalgic   Gait velocity interpretation: at or above normal speed for age/gender General Gait Details: nearly full weight bearing LLE   Stairs            Wheelchair Mobility    Modified  Rankin (Stroke Patients Only)       Balance Overall balance assessment: Independent                                  Cognition Arousal/Alertness: Awake/alert Behavior During Therapy: Anxious (cries easily) Overall Cognitive Status: Within Functional Limits for tasks assessed                      Exercises Total Joint Exercises Ankle Circles/Pumps: AROM;Both;10 reps;Supine Quad Sets: AROM;Both;10 reps;Supine Short Arc Quad: AAROM;Left;10 reps Heel Slides: AAROM;Left;10 reps;Supine    General Comments        Pertinent Vitals/Pain Pain Assessment: 0-10 Pain Score: 9  Pain Location: left knee Pain Descriptors / Indicators: Aching Pain Intervention(s): Limited activity within patient's tolerance;Patient requesting pain meds-RN notified;Ice applied    Home Living Family/patient expects to be discharged to:: Private residence Living Arrangements: Spouse/significant other;Children Available Help at Discharge: Family Type of Home: Mobile home Home Access: Stairs to enter   Home Layout: One level Home Equipment: Cane - single point      Prior Function Level of Independence: Independent          PT Goals (current goals can now be found in the care plan section) Acute Rehab PT Goals Patient Stated Goal: full function of the left knee PT Goal Formulation: With patient Time For  Goal Achievement: 09/29/14 Potential to Achieve Goals: Good Progress towards PT goals: Progressing toward goals    Frequency  BID    PT Plan Current plan remains appropriate    Co-evaluation             End of Session Equipment Utilized During Treatment: Gait belt Activity Tolerance: Patient tolerated treatment well Patient left: in bed;in CPM     Time: 1250-1400 PT Time Calculation (min) (ACUTE ONLY): 70 min  Charges:  $Gait Training: 8-22 mins $Therapeutic Exercise: 8-22 mins $Therapeutic Activity: 8-22 mins                    G CodesKonrad Penta   PT 09/15/2014, 3:02 PM [phone]

## 2014-09-15 NOTE — Progress Notes (Signed)
Patient ID: Alyssa Poole, female   DOB: 07-31-1960, 54 y.o.   MRN: [id number]  POD # 1  Status post left total knee   VS BP 111/63 mmHg  Pulse 54  Temp(Src) 99 F (37.2 C) (Oral)  Resp 20  Ht 5\' 3"  (1.6 m)  Wt 143 lb (64.864 kg)  BMI 25.34 kg/m2  SpO2 98%   LABS  CBC    Component Value Date/Time   WBC 10.1 09/15/2014 0641   RBC 2.87* 09/15/2014 0641   HGB 9.2* 09/15/2014 0641   HCT 27.5* 09/15/2014 0641   PLT 315 09/15/2014 0641   MCV 95.8 09/15/2014 0641   MCH 32.1 09/15/2014 0641   MCHC 33.5 09/15/2014 0641   RDW 12.7 09/15/2014 0641   LYMPHSABS 1.4 09/08/2014 0925   MONOABS 0.4 09/08/2014 0925   EOSABS 0.3 09/08/2014 0925   BASOSABS 0.1 09/08/2014 0925      DRESSING dry  Neuro-vasculo-motor status of operative limb normal   Homans sign normal calf supple  Assessment status post total knee arthroplasty patient doing well  Plan continue physical therapy. Provide adequate pain control. 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 We will continue with Toradol increase to 30 mg and oxycodone 5 mg on a when necessary basis

## 2014-09-15 NOTE — Progress Notes (Signed)
UR chart review completed.  

## 2014-09-16 LAB — CBC
HEMATOCRIT: 27.8 % — AB (ref 36.0–46.0)
HEMOGLOBIN: 9.3 g/dL — AB (ref 12.0–15.0)
MCH: 31.7 pg (ref 26.0–34.0)
MCHC: 33.5 g/dL (ref 30.0–36.0)
MCV: 94.9 fL (ref 78.0–100.0)
Platelets: 317 10*3/uL (ref 150–400)
RBC: 2.93 MIL/uL — ABNORMAL LOW (ref 3.87–5.11)
RDW: 12.6 % (ref 11.5–15.5)
WBC: 8.4 10*3/uL (ref 4.0–10.5)

## 2014-09-16 MED ORDER — ASPIRIN 325 MG PO TBEC: 325.0000 mg | DELAYED_RELEASE_TABLET | Freq: Every day | ORAL | Status: DC

## 2014-09-16 MED ORDER — OXYCODONE-ACETAMINOPHEN 5-325 MG PO TABS: 1.0000 | ORAL_TABLET | ORAL | Status: DC | PRN

## 2014-09-16 NOTE — Progress Notes (Signed)
Subjective: She reports that she is doing well and she wants to go home  Objective: Vital signs in last 24 hours: Temp:  [98.2 F (36.8 C)-99.1 F (37.3 C)] 98.2 F (36.8 C) (06/17 0610) Pulse Rate:  [51-71] 71 (06/17 0610) Resp:  [18] 18 (06/17 0610) BP: (121-132)/(60-71) 128/71 mmHg (06/17 0610) SpO2:  [95 %-99 %] 96 % (06/17 0610)  Intake/Output from previous day: 06/16 0701 - 06/17 0700 In: 1588.8 [P.O.:1080; I.V.:508.8] Out: 1400 [Urine:1225; Drains:175] Intake/Output this shift:     Recent Labs  09/15/14 0641 09/16/14 0547  HGB 9.2* 9.3*    Recent Labs  09/15/14 0641 09/16/14 0547  WBC 10.1 8.4  RBC 2.87* 2.93*  HCT 27.5* 27.8*  PLT 315 317    Recent Labs  09/15/14 0641  NA 140  K 3.5  CL 103  CO2 31  BUN 12  CREATININE 0.85  GLUCOSE 116*  CALCIUM 8.6*   No results for input(s): LABPT, INR in the last 72 hours. The incision shows that the knee looks good she has minimal swelling she has excellent dorsiflexion plantar flexion, her calf is soft and supple with a negative Homans  Her drain was removed intact    Assessment/Plan: Stable ready to go home discharged today  [name] 09/16/2014, 7:17 AM

## 2014-09-16 NOTE — Addendum Note (Signed)
Addendum  created 09/16/14 0913 by Earleen Newport, CRNA   Modules edited: Notes Section   Notes Section:  File: [id number]

## 2014-09-16 NOTE — Discharge Summary (Signed)
Physician Discharge Summary  Patient ID: Alyssa Poole MRN: [id number] DOB/AGE: 56-Feb-1962 56 y.o.  Admit date: 09/14/2014 Discharge date: 09/16/2014  Admission Diagnoses: Osteoarthritis left knee  Discharge Diagnoses: Osteoarthritis left knee Active Problems:   Arthritis of left knee   Discharged Condition: stable  Hospital Course: The patient was admitted on 09/14/2014 she underwent left total knee arthroplasty with general anesthesia. Postoperatively in the PACU she became apneic secondary to medication. She received fentanyl and Dilaudid and oxycodone. She responded well to Narcan and had no other problems during her postoperative course.  She was maintained on oral analgesia without IV medicine. She ambulated well did the steps well knee flexion 88. Knee extension -8.       Discharge Exam: Blood pressure 136/71, pulse 65, temperature 99.5 F (37.5 C), temperature source Oral, resp. rate 18, height  (1.6 m), weight 143 lb (64.864 kg), SpO2 96 %. She is awake alert and oriented 3 mood and affect are normal knee incision is clean dry and intact Is supple soft negative Homans sign incision is clean  Disposition: 01-Home or Self Care  Discharge Instructions    CPM    Complete by:  As directed   Continuous passive motion machine (CPM):      Use the CPM from 0 to 80 for 6 hours per day.      You may increase by 10 per day.  You may break it up into 2 or 3 sessions per day.      Use CPM for 3 weeks or until you are told to stop.     Call MD / Call 911    Complete by:  As directed   If you experience chest pain or shortness of breath, CALL 911 and be transported to the hospital emergency room.  If you develope a fever above 101 F, pus (white drainage) or increased drainage or redness at the wound, or calf pain, call your surgeon's office.     Change dressing    Complete by:  As directed   Change dressing on sun, then change the dressing daily with sterile 4 x 4 inch  gauze dressing and apply TED hose.  You may clean the incision with alcohol prior to redressing.     Constipation Prevention    Complete by:  As directed   Drink plenty of fluids.  Prune juice may be helpful.  You may use a stool softener, such as Colace (over the counter) 100 mg twice a day.  Use MiraLax (over the counter) for constipation as needed.     Diet - low sodium heart healthy    Complete by:  As directed      Do not put a pillow under the knee. Place it under the heel.    Complete by:  As directed      Driving restrictions    Complete by:  As directed   No driving for 3 weeks     Increase activity slowly as tolerated    Complete by:  As directed             Medication List    STOP taking these medications        diclofenac 50 MG tablet  Commonly known as:  CATAFLAM     HYDROcodone-acetaminophen 10-325 MG per tablet  Commonly known as:  NORCO     predniSONE 5 MG Tabs tablet  Commonly known as:  STERAPRED UNI-PAK     promethazine 12.5 MG tablet  Commonly known as:  PHENERGAN      TAKE these medications        alendronate 70 MG tablet  Commonly known as:  FOSAMAX  Take 70 mg by mouth every 7 (seven) days. Takes on Sunday. Take with a full glass of water on an empty stomach.     aspirin 325 MG EC tablet  Take 1 tablet (325 mg total) by mouth daily with breakfast.     baclofen 10 MG tablet  Commonly known as:  LIORESAL  Take 1 tablet (10 mg total) by mouth 3 (three) times daily.     Black Cohosh 175 MG Caps  Take 175 mg by mouth 3 (three) times daily.     CALCIUM 600 + D PO  Take 1 tablet by mouth daily.     diclofenac 75 MG EC tablet  Commonly known as:  VOLTAREN  Take 1 tablet (75 mg total) by mouth 2 (two) times daily.     gabapentin 300 MG capsule  Commonly known as:  NEURONTIN  Take 300 mg by mouth 3 (three) times daily.     LORazepam 0.5 MG tablet  Commonly known as:  ATIVAN  Take 0.5 mg by mouth at bedtime.      losartan-hydrochlorothiazide 50-12.5 MG per tablet  Commonly known as:  HYZAAR  Take 1 tablet by mouth daily.     oxyCODONE-acetaminophen 5-325 MG per tablet  Commonly known as:  ROXICET  Take 1 tablet by mouth every 4 (four) hours as needed for severe pain.     pantoprazole 40 MG tablet  Commonly known as:  PROTONIX  Take 40 mg by mouth Daily.     PARoxetine 20 MG tablet  Commonly known as:  PAXIL  Take 20 mg by mouth daily.           Follow-up Information    Follow up with [facility].   Contact information:   [address] [place] [address] [phone]       Follow up with [name], MD.   Specialties:  Orthopedic Surgery, Radiology   Contact information:   [address] [place] [address] [phone]       Signed: [name] 09/16/2014, 3:24 PM

## 2014-09-16 NOTE — Progress Notes (Signed)
Physical Therapy Treatment Patient Details Name: Alyssa Poole MRN: [id number] DOB: Aug 08, 1960 Today's Date: 09/16/2014    History of Present Illness Pt underwent left TKR 09-14-14.  She is normally independent with all ADLs, lives with significant other and her son.    PT Comments    Pt continues to be anxious to go home.  The left knee ROM is 8-88 degrees, AA.  CPM was initiated at 0-70 degrees to the LLE, pt tolerated well.  Cryocuff and SCDs also in place.    Follow Up Recommendations  Home health PT     Equipment Recommendations  Rolling walker with 5" wheels    Recommendations for Other Services  none     Precautions / Restrictions Precautions Precautions: Knee Precaution Booklet Issued: Yes (comment) Restrictions LLE Weight Bearing: Weight bearing as tolerated    Mobility  Bed Mobility Overal bed mobility: Modified Independent       Supine to sit: Modified independent (Device/Increase time) Sit to supine: Modified independent (Device/Increase time)      Transfers Overall transfer level: Modified independent Equipment used: Rolling walker (2 wheeled)   Sit to Stand: Modified independent (Device/Increase time)            Ambulation/Gait                 Stairs            Wheelchair Mobility    Modified Rankin (Stroke Patients Only)       Balance                                    Cognition Arousal/Alertness: Awake/alert Behavior During Therapy: WFL for tasks assessed/performed Overall Cognitive Status: Within Functional Limits for tasks assessed                      Exercises Total Joint Exercises Ankle Circles/Pumps: AROM;Both;10 reps;Supine Quad Sets: AROM;Both;10 reps;Supine Short Arc Quad: AAROM;Left;10 reps Heel Slides: AAROM;Left;10 reps;Supine Goniometric ROM: 8-88 degrees, AA    General Comments        Pertinent Vitals/Pain Pain Assessment: No/denies pain    Home Living                      Prior Function            PT Goals (current goals can now be found in the care plan section) Progress towards PT goals: PT to reassess next treatment    Frequency  BID    PT Plan Current plan remains appropriate    Co-evaluation             End of Session Equipment Utilized During Treatment: Gait belt Activity Tolerance: Patient tolerated treatment well Patient left: in bed;in CPM;with call bell/phone within reach;with bed alarm set     Time: 1340-1415 PT Time Calculation (min) (ACUTE ONLY): 35 min  Charges:  $Therapeutic Exercise: 8-22 mins $Therapeutic Activity: 8-22 mins                    G CodesKonrad Penta  PT 09/16/2014, 3:31 PM [phone]

## 2014-09-16 NOTE — Anesthesia Postprocedure Evaluation (Signed)
  Anesthesia Post-op Note  Patient: Alyssa Poole  Procedure(s) Performed: Procedure(s): LEFT TOTAL KNEE ARTHROPLASTY (Left)  Patient Location: Room 309  Anesthesia Type:General  Level of Consciousness: awake, alert  and oriented  Airway and Oxygen Therapy: Patient Spontanous Breathing  Post-op Pain: mild  Post-op Assessment: Post-op Vital signs reviewed, Patient's Cardiovascular Status Stable, Respiratory Function Stable, Patent Airway, No signs of Nausea or vomiting and Pain level controlled              Post-op Vital Signs: Reviewed and stable  Last Vitals:  Filed Vitals:   09/16/14 0841  BP: 136/71  Pulse: 65  Temp: 37.5 C  Resp: 18    Complications: No apparent anesthesia complications

## 2014-09-16 NOTE — Progress Notes (Signed)
Physical Therapy Treatment Patient Details Name: Alyssa Poole MRN: [id number] DOB: [birth year] Today's Date: 09/16/2014    History of Present Illness Pt underwent left TKR 09-14-14.  She is normally independent with all ADLs, lives with significant other and her son.    PT Comments    Pt is very anxious to get home.  She is doing well with PT.  Her knee is more edemetus today  She is independent with transfers and is ambulating at least 200' with a walker, nearly full weight bearing.  She was instructed on a step this AM.  Her knee extension is a bit decreased, so I spent some time instructing in the importance of achieving full knee extension.  She was also instructed in bridging the left knee with a towel.  AA ROM of the left knee is 8-80 degrees.  She tends to be highly emotional and will benefit from discharge as soon as MD deems appropriate.  Follow Up Recommendations  Home health PT     Equipment Recommendations  Rolling walker with 5" wheels    Recommendations for Other Services  none     Precautions / Restrictions Precautions Precautions: Knee Precaution Booklet Issued: Yes (comment) Restrictions Weight Bearing Restrictions: Yes LLE Weight Bearing: Weight bearing as tolerated    Mobility  Bed Mobility   Bed Mobility: Supine to Sit     Supine to sit: Modified independent (Device/Increase time) Sit to supine: Modified independent (Device/Increase time)   General bed mobility comments: Pt instructed in using the RLE to assist the LLE into the bed  Transfers Overall transfer level: Modified independent Equipment used: Rolling walker (2 wheeled) Transfers: Sit to/from Stand Sit to Stand: Modified independent (Device/Increase time)            Ambulation/Gait Ambulation/Gait assistance: Modified independent (Device/Increase time) Ambulation Distance (Feet): 200 Feet Assistive device: Rolling walker (2 wheeled) Gait Pattern/deviations: WFL(Within Functional  Limits)   Gait velocity interpretation: at or above normal speed for age/gender General Gait Details: nearly full weight bearing LLE   Stairs Stairs: Yes Stairs assistance: Supervision Stair Management: Backwards Number of Stairs: 1 General stair comments: pt has only one step at home and she had no difficulty with this  Wheelchair Mobility    Modified Rankin (Stroke Patients Only)       Balance Overall balance assessment: Independent                                  Cognition Arousal/Alertness: Awake/alert Behavior During Therapy: WFL for tasks assessed/performed Overall Cognitive Status: Within Functional Limits for tasks assessed                      Exercises Total Joint Exercises Ankle Circles/Pumps: AROM;Both;10 reps;Supine Quad Sets: AROM;Both;10 reps;Supine Short Arc Quad: AAROM;Left;10 reps Heel Slides: AAROM;Left;10 reps;Supine Goniometric ROM: 8-80 degrees, AA    General Comments        Pertinent Vitals/Pain Pain Assessment: 0-10 Pain Score: 9  Pain Location: left knee Pain Descriptors / Indicators: Aching Pain Intervention(s): Limited activity within patient's tolerance;Patient requesting pain meds-RN notified;Ice applied    Home Living                      Prior Function            PT Goals (current goals can now be found in the care plan section) Progress towards PT goals:  PT to reassess next treatment    Frequency  BID    PT Plan Current plan remains appropriate    Co-evaluation             End of Session Equipment Utilized During Treatment: Gait belt Activity Tolerance: Patient tolerated treatment well Patient left: in bed;with call bell/phone within reach (pt had been up all morning in the chair)     Time: 6004-5997 PT Time Calculation (min) (ACUTE ONLY): 50 min  Charges:  $Gait Training: 8-22 mins $Therapeutic Exercise: 8-22 mins $Therapeutic Activity: 8-22 mins                    G  CodesKonrad Penta  PT 09/16/2014, 11:21 AM [phone]

## 2014-09-16 NOTE — Progress Notes (Signed)
Pt discharged home today per Dr. Romeo Apple.  Pt's IV site D/C'd and WDL.  Pt's VSS.  Pt provided with home medication list, discharge instructions and prescriptions.  Verbalized understanding.  Pt left floor via WC in stable condition accompanied by NT.

## 2014-09-16 NOTE — Care Management Note (Signed)
Case Management Note  Patient Details  Name: Alyssa Poole MRN: [id number] Date of Birth: December 25, 1960  Subjective/Objective:                    Action/Plan:   Expected Discharge Date:                  Expected Discharge Plan:  Home w Home Health Services  In-House Referral:  NA  Discharge planning Services  CM Consult  Post Acute Care Choice:  Durable Medical Equipment, Home Health Choice offered to:  Patient  DME Arranged:  Walker rolling, Bedside commode (CPM ordered from [facility] by MD office prior to admission) DME Agency:  [facility]  HH Arranged:  PT [facility] Agency:  [facility]  Status of Service:  Completed, signed off  Medicare Important Message Given:  Yes Date Medicare IM Given:  09/16/14 Medicare IM give by:  Arlyss Queen, RN BSN CM Date Additional Medicare IM Given:    Additional Medicare Important Message give by:     If discussed at Long Length of Stay Meetings, dates discussed:    Additional Comments: Pt discharged home today with [facility] PT (per pts choice). [name] of [facility] is aware and will collect the pts information from the chart. HH services to start within 48 hours of discharge. CPM was arranged by Dr. Romeo Apple office prior to surgery with [facility] who is aware that pt is discharging home today. They will deliver pts CPm this afternoon at pts home. Pt has rolling walker and BSC from [facility] in the room. No other CM needs noted. Pt and pts nurse aware of discharge arrangements. Arlyss Queen [place], RN 09/16/2014, 3:28 PM

## 2014-09-16 NOTE — Care Management Note (Signed)
Case Management Note  Patient Details  Name: Alyssa Poole MRN: [id number] Date of Birth: March 23, 1961  Subjective/Objective:                    Action/Plan:   Expected Discharge Date:                  Expected Discharge Plan:  Home w Home Health Services  In-House Referral:  NA  Discharge planning Services  CM Consult  Post Acute Care Choice:  Durable Medical Equipment, Home Health Choice offered to:  Patient  DME Arranged:  CPM, Walker rolling, Bedside commode DME Agency:  [facility]  HH Arranged:  PT [facility] Agency:  [facility]  Status of Service:  In process, will continue to follow  Medicare Important Message Given:  Yes Date Medicare IM Given:  09/16/14 Medicare IM give by:  Arlyss Queen, RN BSN CM Date Additional Medicare IM Given:    Additional Medicare Important Message give by:     If discussed at Long Length of Stay Meetings, dates discussed:    Additional Comments: Anticipate discharge within 24 hours. Pt HH PT arranged with [facility] (per pts choice). [name] of [facility] is aware and will collect the pts information from the chart. HH services to start within 48 hours of discharge. Pts DME has been arranged with [facility] as well. CPM will be delivered to pts home after discharge. RW and 3N1 will be delivered to pts room prior to discharge. Pt and pts nurse aware of discharge arrangements. Arlyss Queen Morgan, RN 09/16/2014, 8:48 AM

## 2014-09-18 LAB — TYPE AND SCREEN
ABO/RH(D): O POS
Antibody Screen: NEGATIVE
UNIT DIVISION: 0
Unit division: 0

## 2014-09-26 ENCOUNTER — Ambulatory Visit (INDEPENDENT_AMBULATORY_CARE_PROVIDER_SITE_OTHER): Payer: Medicare Other | Admitting: Orthopedic Surgery

## 2014-09-26 ENCOUNTER — Encounter: Payer: Self-pay | Admitting: Orthopedic Surgery

## 2014-09-26 VITALS — Ht 63.0 in | Wt 143.0 lb

## 2014-09-26 DIAGNOSIS — Z96652 Presence of left artificial knee joint: Secondary | ICD-10-CM

## 2014-09-26 MED ORDER — HYDROCODONE-ACETAMINOPHEN 10-325 MG PO TABS: 1.0000 | ORAL_TABLET | ORAL | Status: DC | PRN

## 2014-09-26 NOTE — Progress Notes (Signed)
Chief Complaint  Patient presents with  . Follow-up    Post op #1, left knee replacement. DOS 09-14-14.    The patient is doing well postop from her knee replacement 12 days ago left knee. We will go down one level on her pain medicine but we really fill her some medication she will start physical therapy outpatient next week she'll follow-up in 4 weeks  Her knee flexion is 93 by the therapist measurements pain is 5-10 and 0-10 after control measures she's walking 200 feet and progressing well she does need to work on extension  Follow-up with me in 4 weeks

## 2014-09-26 NOTE — Patient Instructions (Signed)
Call aph therapy dept to schedule therapy for next week

## 2014-10-06 ENCOUNTER — Ambulatory Visit (HOSPITAL_COMMUNITY): Payer: Medicare Other | Attending: Orthopedic Surgery | Admitting: Physical Therapy

## 2014-10-06 DIAGNOSIS — M25362 Other instability, left knee: Secondary | ICD-10-CM | POA: Diagnosis present

## 2014-10-06 DIAGNOSIS — R269 Unspecified abnormalities of gait and mobility: Secondary | ICD-10-CM | POA: Insufficient documentation

## 2014-10-06 DIAGNOSIS — R29898 Other symptoms and signs involving the musculoskeletal system: Secondary | ICD-10-CM | POA: Insufficient documentation

## 2014-10-06 DIAGNOSIS — M129 Arthropathy, unspecified: Secondary | ICD-10-CM | POA: Insufficient documentation

## 2014-10-06 DIAGNOSIS — M25662 Stiffness of left knee, not elsewhere classified: Secondary | ICD-10-CM

## 2014-10-06 DIAGNOSIS — R262 Difficulty in walking, not elsewhere classified: Secondary | ICD-10-CM | POA: Insufficient documentation

## 2014-10-06 NOTE — Therapy (Signed)
[facility] [address] [place], [name], [address] Phone: [phone]   Fax:  [phone]  Physical Therapy Evaluation  Patient Details  Name: Alyssa Poole MRN: [id number] Date of Birth: Feb 26, 1961 Referring Provider:  Vickki HearingHarrison, Stanley E, MD  Encounter Date: 10/06/2014      PT End of Session - 10/06/14 1339    Visit Number 1   Number of Visits 18   Date for PT Re-Evaluation 11/05/14   Authorization Type medicare   PT Start Time 1307   PT Stop Time 1350   PT Time Calculation (min) 43 min      Past Medical History  Diagnosis Date  . Hypertension   . Anxiety   . Osteoporosis   . GERD (gastroesophageal reflux disease)   . Neuromuscular disorder   . Depression 2002  . Panic attacks 2002  . Asthma     Past Surgical History  Procedure Laterality Date  . Back surgery    . Cervical spine surgery    . Cholecystectomy    . Knee arthroscopy with medial menisectomy Left 03/09/2014    Procedure: LEFT KNEE ARTHROSCOPY WITH PARTIAL MEDIAL MENISECTOMY;  Surgeon: Vickki HearingStanley E Harrison, MD;  Location: [facility];  Service: Orthopedics;  Laterality: Left;  . Total knee arthroplasty Left 09/14/2014    Procedure: LEFT TOTAL KNEE ARTHROPLASTY;  Surgeon: Vickki HearingStanley E Harrison, MD;  Location: [facility];  Service: Orthopedics;  Laterality: Left;    There were no vitals filed for this visit.  Visit Diagnosis:  Knee stiffness, left  Weakness of left leg  Abnormality of gait  Difficulty walking      Subjective Assessment - 10/06/14 1309    Subjective Pt states she had a Lt TKR on 09/14/2014 and feels she is doing fairly well.  She is no longer using an assistive device to ambulate.  She has difficulty putting on her shoes and socks due to the lack of bending and she states she is still havng trouble with the swelling therefore she is being referred to physical therapy.   Pertinent History htn    How long can you sit comfortably? five minutes   How  long can you stand comfortably? 20 minutes    How long can you walk comfortably? 15 minutes    Patient Stated Goals be able to bend her knee more, put shoes and socks on with ease and sit longer.    Currently in Pain? No/denies  highest is a 10/10 at night    Pain Location Knee   Pain Orientation Left            [facility] - 10/06/14 0001    Assessment   Medical Diagnosis Lt TKR   Onset Date/Surgical Date 09/14/14   Prior Therapy HH   Precautions   Precautions None   Restrictions   Weight Bearing Restrictions No   Balance Screen   Has the patient fallen in the past 6 months No   Has the patient had a decrease in activity level because of a fear of falling?  Yes   Is the patient reluctant to leave their home because of a fear of falling?  No   Home Environment   Living Environment Private residence   Type of Home Mobile home   Home Access Stairs to enter   Entrance Stairs-Number of Steps 1   Prior Function   Level of Independence Independent   Vocation On disability   Leisure crochet  Cognition   Overall Cognitive Status Within Functional Limits for tasks assessed   Observation/Other Assessments   Focus on Therapeutic Outcomes (FOTO)  37   Functional Tests   Functional tests Single leg stance   Single Leg Stance   Comments Rt:  60 seconds; LT 28   ROM / Strength   AROM / PROM / Strength AROM;Strength   AROM   AROM Assessment Site Knee   Right/Left Knee Left   Left Knee Extension 15   Left Knee Flexion 92   Strength   Strength Assessment Site Hip;Knee;Ankle   Right/Left Hip Left   Left Hip Flexion 5/5   Left Hip Extension 4-/5   Left Hip ABduction 4/5   Right/Left Knee Left   Left Knee Flexion 5/5   Left Knee Extension 5/5   Right/Left Ankle Left   Left Ankle Dorsiflexion 5/5                   [facility] Adult PT Treatment/Exercise - 10/06/14 0001    Exercises   Exercises Knee/Hip   Knee/Hip Exercises: Stretches   Active Hamstring  Stretch Left;3 reps;30 seconds   Knee/Hip Exercises: Standing   SLS x2   Knee/Hip Exercises: Supine   Quad Sets 10 reps   Heel Slides 5 reps   Terminal Knee Extension 10 reps   Knee/Hip Exercises: Sidelying   Hip ABduction 10 reps   Knee/Hip Exercises: Prone   Hamstring Curl 5 reps   Hip Extension 10 reps                PT Education - 10/06/14 1338    Education provided Yes   Education Details HEP   Person(s) Educated Patient   Methods Explanation;Handout   Comprehension Verbalized understanding;Returned demonstration          PT Short Term Goals - 10/06/14 1346    PT SHORT TERM GOAL #1   Title Patient will demosntrate full knee extension to decrease limping and normalize stride length   Time 3   Period Weeks   Status New   PT SHORT TERM GOAL #2   Title Patient will demosntrate increased knee flexion to >100 degrees to be able to sit to normal height chair for 30 minutes with comfort   Time 3   Period Weeks   Status New   PT SHORT TERM GOAL #3   Title patient will demonstrate increased glut med/max strength of 4/5 MMT bilaterally to be able to sit to stand withtou UE assistance 5x in <15 seconds.    Time 3   Period Weeks   PT SHORT TERM GOAL #4   Title Pt pain will be no greater than a 6/10    Time 3   Period Weeks   PT SHORT TERM GOAL #5   Title Patient will state independence with HEP.    Time 3   Period Weeks   Status New           PT Long Term Goals - 10/06/14 1354    PT LONG TERM GOAL #1   Title Patient will demosntrate increased knee flexion to >120 degrees to be able to sit to normal height chair for an hour with comfort.  As well as put her shoes and socks on   Time 6   Period Weeks   Status New   PT LONG TERM GOAL #2   Title patient will demonstrate increased glut med/max strength of 5/5 MMT bilaterally to be able to complete  steps in a reciprocal manner   Time 6   Period Weeks   Status New   PT LONG TERM GOAL #3   Title Pt pain to  be no greater than a 3/10 90% of the time   Time 6   Period Weeks   Status New   PT LONG TERM GOAL #4   Title Patient will be able to walk withotu knee pain >41minutes.    Time 6   Period Weeks   Status New               Plan - Oct 10, 2014 1343    Clinical Impression Statement Alyssa Poole is a 54 yo female who has had a recent TKR.  She is experiencing decreased ROM, decreased strength, decreased balance and increased edema and pain and will benefit from skilled physcial therapy to maximize her functional ability.    Pt will benefit from skilled therapeutic intervention in order to improve on the following deficits Abnormal gait;Pain;Decreased balance;Decreased activity tolerance;Decreased strength;Difficulty walking;Increased edema   Rehab Potential Good   PT Frequency 3x / week   PT Duration 6 weeks   PT Treatment/Interventions Gait training;Stair training;Functional mobility training;Therapeutic activities;Therapeutic exercise;Balance training;Patient/family education;Manual techniques   PT Next Visit Plan begin heel toe gt training, rocker board, heel raises, functional squats. then concentrate on ROM and balance.  Majority of therapy sessions will concentrate on improving ROM   PT Home Exercise Plan given           G-Codes - 2014-10-10 1351    Functional Assessment Tool Used foto   Functional Limitation Mobility: Walking and moving around   Mobility: Walking and Moving Around Current Status [phone]) At least 60 percent but less than 80 percent impaired, limited or restricted   Mobility: Walking and Moving Around Goal Status [phone]) At least 40 percent but less than 60 percent impaired, limited or restricted       Problem List Patient Active Problem List   Diagnosis Date Noted  . Arthritis of left knee 09/14/2014  . Knee contusion 12/27/2013  . Medial meniscus, posterior horn derangement 12/27/2013  . Arthritis of knee, left 12/27/2013  . Left knee pain 12/27/2013  .  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 KNEE PAIN 05/11/2007  . DISC DEGENERATION 05/11/2007  . SCIATICA 05/11/2007   Alyssa Poole, PT CLT [phone] 10/10/2014, 1:55 PM  [facility] [address] [place], [place], [address] Phone: [phone]   Fax:  [phone]

## 2014-10-06 NOTE — Patient Instructions (Addendum)
Strengthening: Quadriceps Set   Tighten muscles on top of thighs by pushing knees down into surface. Hold _3___ seconds. Repeat _10___ times per set. Do ___1_ sets per session. Do _3___ sessions per day.  http://orth.exer.us/602   Copyright  VHI. All rights reserved.  Stretching: Hamstring (Supine)   Supporting right thigh behind knee, slowly straighten knee until stretch is felt in back of thigh. Hold __30__ seconds. Repeat _3___ times per set. Do _1___ sets per session. Do ___3_ sessions per day.  http://orth.exer.us/656   Copyright  VHI. All rights reserved.  Strengthening: Terminal Knee Extension (Supine)   With right knee over bolster, straighten knee by tightening muscles on top of thigh. Keep bottom of knee on bolster. Repeat 10____ times per set. Do 1____ sets per session. Do __3__ sessions per day.  http://orth.exer.us/626   Copyright  VHI. All rights reserved.  Self-Mobilization: Heel Slide (Supine)   Slide left heel toward buttocks until a gentle stretch is felt. Hold __5__ seconds. Relax. Repeat _10___ times per set. Do _1___ sets per session. Do __2__ sessions per day.  http://orth.exer.us/710   Copyright  VHI. All rights reserved.  Strengthening: Hip Abduction (Side-Lying)   Tighten muscles on front of left thigh, then lift leg __15__ inches from surface, keeping knee locked.  Repeat __10__ times per set. Do _1___ sets per session. Do __3__ sessions per day.  http://orth.exer.us/622   Copyright  VHI. All rights reserved.  Self-Mobilization: Knee Flexion (Prone)   Bring left heel toward buttocks as close as possible. Hold __2__ seconds. Relax. Repeat _10___ times per set. Do ___1_ sets per session. Do __2__ sessions per day.  http://orth.exer.us/596   Copyright  VHI. All rights reserved.  Strengthening: Hip Extension (Prone)   Tighten muscles on front of left thigh, then lift leg __2__ inches from surface, keeping knee locked. Repeat __10__  times per set. Do __1__ sets per session. Do __2__ sessions per day. 1 http://orth.exer.us/620   Copyright  VHI. All rights reserved.

## 2014-10-11 ENCOUNTER — Ambulatory Visit (INDEPENDENT_AMBULATORY_CARE_PROVIDER_SITE_OTHER): Payer: Medicare Other | Admitting: Orthopedic Surgery

## 2014-10-11 VITALS — BP 111/68 | Ht 63.0 in | Wt 143.0 lb

## 2014-10-11 DIAGNOSIS — Z4789 Encounter for other orthopedic aftercare: Secondary | ICD-10-CM | POA: Insufficient documentation

## 2014-10-11 DIAGNOSIS — Z96652 Presence of left artificial knee joint: Secondary | ICD-10-CM

## 2014-10-11 NOTE — Progress Notes (Signed)
Patient ID: Silvano RuskBobbie W Benton, female   DOB: Dec 13, 1960, 54 y.o.   MRN: [id number]  Follow up visit  Chief Complaint  Patient presents with  . Wound Check    wound check LEFT TKA, DOS 09/14/14    BP 111/68 mmHg  Ht 5\' 3"  (1.6 m)  Wt 143 lb (64.864 kg)  BMI 25.34 kg/m2   aftercare musculoskeletal left knee replacement   knee replacement almost a month ago patient had a scab proximally thought it might be infected. We removed it clean it with alcohol advised her to use alcohol and cover the knee with a Band-Aid keep previous appointment

## 2014-10-18 ENCOUNTER — Other Ambulatory Visit: Payer: Self-pay | Admitting: Orthopedic Surgery

## 2014-10-20 ENCOUNTER — Ambulatory Visit: Payer: Medicare Other | Admitting: Orthopedic Surgery

## 2014-10-25 ENCOUNTER — Ambulatory Visit (HOSPITAL_COMMUNITY): Payer: Medicare Other | Admitting: Physical Therapy

## 2014-10-27 ENCOUNTER — Ambulatory Visit (HOSPITAL_COMMUNITY): Payer: Medicare Other

## 2014-10-27 DIAGNOSIS — R269 Unspecified abnormalities of gait and mobility: Secondary | ICD-10-CM

## 2014-10-27 DIAGNOSIS — R262 Difficulty in walking, not elsewhere classified: Secondary | ICD-10-CM

## 2014-10-27 DIAGNOSIS — M25662 Stiffness of left knee, not elsewhere classified: Secondary | ICD-10-CM

## 2014-10-27 DIAGNOSIS — M25362 Other instability, left knee: Secondary | ICD-10-CM

## 2014-10-27 DIAGNOSIS — R29898 Other symptoms and signs involving the musculoskeletal system: Secondary | ICD-10-CM

## 2014-10-27 DIAGNOSIS — M1710 Unilateral primary osteoarthritis, unspecified knee: Secondary | ICD-10-CM

## 2014-10-27 NOTE — Therapy (Signed)
[facility] [address] [place], [place], [address] Phone: [phone]   Fax:  [phone]  Physical Therapy Treatment  Patient Details  Name: Alyssa Poole MRN: [id number] Date of Birth: 21-Feb-1961 Referring Provider:  Vickki Hearing, MD  Encounter Date: 10/27/2014      PT End of Session - 10/27/14 0959    Visit Number 2   Number of Visits 18   Date for PT Re-Evaluation 11/05/14   Authorization Type medicare   PT Start Time 1000   PT Stop Time 1056   PT Time Calculation (min) 56 min   Activity Tolerance Patient tolerated treatment well   Behavior During Therapy [facility] for tasks assessed/performed      Past Medical History  Diagnosis Date  . Hypertension   . Anxiety   . Osteoporosis   . GERD (gastroesophageal reflux disease)   . Neuromuscular disorder   . Depression 2002  . Panic attacks 2002  . Asthma     Past Surgical History  Procedure Laterality Date  . Back surgery    . Cervical spine surgery    . Cholecystectomy    . Knee arthroscopy with medial menisectomy Left 03/09/2014    Procedure: LEFT KNEE ARTHROSCOPY WITH PARTIAL MEDIAL MENISECTOMY;  Surgeon: Vickki Hearing, MD;  Location: [facility];  Service: Orthopedics;  Laterality: Left;  . Total knee arthroplasty Left 09/14/2014    Procedure: LEFT TOTAL KNEE ARTHROPLASTY;  Surgeon: Vickki Hearing, MD;  Location: [facility];  Service: Orthopedics;  Laterality: Left;    There were no vitals filed for this visit.  Visit Diagnosis:  Knee stiffness, left  Weakness of left leg  Abnormality of gait  Difficulty walking  Knee arthropathy  Knee buckling, left      Subjective Assessment - 10/27/14 0958    Subjective Pt stated knee is feeling good, reports compliance with HEP 2-3x a day.     Currently in Pain? No/denies            [facility] PT Assessment - 10/27/14 0001    Assessment   Medical Diagnosis Lt TKR   Onset Date/Surgical Date 09/14/14   Next  MD Visit [facility]  11/21/2014   Prior Therapy HH   Precautions   Precautions None            [facility] Adult PT Treatment/Exercise - 10/27/14 0001    Exercises   Exercises Knee/Hip   Knee/Hip Exercises: Stretches   Active Hamstring Stretch Both;3 reps;30 seconds   Active Hamstring Stretch Limitations 14in step   Knee: Self-Stretch Limitations 10 knee drives on 8in step   Gastroc Stretch 3 reps;30 seconds   Gastroc Stretch Limitations slant board   Knee/Hip Exercises: Aerobic   Stationary Bike 8' for ROM full revolution seat 6   Knee/Hip Exercises: Standing   Heel Raises 15 reps   Heel Raises Limitations Toe raises with HHA   Functional Squat 10 reps   Rocker Board 2 minutes   Rocker Board Limitations R/L and A/P   SLS Lt 21", Rt 60"+ max of 3   Gait Training Gait training heel to toe, equalize stance phase and stride length as well as cueing for UE swing with opposite heel strike x 600 feet   Knee/Hip Exercises: Supine   Quad Sets 10 reps   Short Arc Quad Sets 15 reps   Heel Slides 10 reps            PT Short Term Goals -  10/27/14 1015    PT SHORT TERM GOAL #1   Title Patient will demosntrate full knee extension to decrease limping and normalize stride length   Status On-going   PT SHORT TERM GOAL #2   Title Patient will demosntrate increased knee flexion to >100 degrees to be able to sit to normal height chair for 30 minutes with comfort   Status On-going   PT SHORT TERM GOAL #3   Title patient will demonstrate increased glut med/max strength of 4/5 MMT bilaterally to be able to sit to stand withtou UE assistance 5x in <15 seconds.    Status On-going   PT SHORT TERM GOAL #4   Title Pt pain will be no greater than a 6/10    Status On-going   PT SHORT TERM GOAL #5   Title Patient will state independence with HEP.    Status On-going           PT Long Term Goals - 10/27/14 1016    PT LONG TERM GOAL #1   Title Patient will demosntrate increased knee flexion to  >120 degrees to be able to sit to normal height chair for an hour with comfort.  As well as put her shoes and socks on   PT LONG TERM GOAL #2   Title patient will demonstrate increased glut med/max strength of 5/5 MMT bilaterally to be able to complete steps in a reciprocal manner   PT LONG TERM GOAL #3   Title Pt pain to be no greater than a 3/10 90% of the time   PT LONG TERM GOAL #4   Title Patient will be able to walk withotu knee pain >90minutes.                Plan - 10/27/14 1026    Clinical Impression Statement Reviewed goals, compliance with HEP and copy of evaluation given to pt.  Began session with gait training with min cueing for heel to toe with knee extension, equalized stride length and stance phase as well as cueing for arm swing with opposite heel strike for more normalized gait mechanics and balance.  Began rocker board to improve weight distribution with gait.  Session focus on improving AROM with stretches and ROM exercises and functional strenghtening.  Pt educated on importance of ROM to improve gait mechanics.  AROM 8-98 degrees folllowing ROM exercises and stretches.  No reports of pain through session.   PT Next Visit Plan Continue with PT POC to improve gait mechanics with heel to toe and arm swing, focus on ROM, functional strengthening and balance.  Begin standing and resume supine TKE next session.          Problem List Patient Active Problem List   Diagnosis Date Noted  . Surgical aftercare, musculoskeletal system 10/11/2014  . Status post total left knee replacement 10/11/2014  . Arthritis of left knee 09/14/2014  . Knee contusion 12/27/2013  . Medial meniscus, posterior horn derangement 12/27/2013  . Arthritis of knee, left 12/27/2013  . Left knee pain 12/27/2013  . 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 KNEE PAIN 05/11/2007  . DISC DEGENERATION 05/11/2007  . SCIATICA 05/11/2007   Becky Sax, LPTA; CBIS [phone]  Juel Burrow 10/27/2014, 10:55 AM  [facility] [address] [place], [place], [address] Phone: [phone]   Fax:  [phone]

## 2014-10-31 ENCOUNTER — Ambulatory Visit (HOSPITAL_COMMUNITY): Payer: Medicare Other | Admitting: Physical Therapy

## 2014-11-02 ENCOUNTER — Ambulatory Visit (HOSPITAL_COMMUNITY): Payer: Medicare Other | Attending: Orthopedic Surgery | Admitting: Physical Therapy

## 2014-11-02 DIAGNOSIS — M129 Arthropathy, unspecified: Secondary | ICD-10-CM | POA: Insufficient documentation

## 2014-11-02 DIAGNOSIS — R29898 Other symptoms and signs involving the musculoskeletal system: Secondary | ICD-10-CM | POA: Diagnosis present

## 2014-11-02 DIAGNOSIS — R269 Unspecified abnormalities of gait and mobility: Secondary | ICD-10-CM

## 2014-11-02 DIAGNOSIS — M25662 Stiffness of left knee, not elsewhere classified: Secondary | ICD-10-CM | POA: Insufficient documentation

## 2014-11-02 DIAGNOSIS — M25362 Other instability, left knee: Secondary | ICD-10-CM | POA: Insufficient documentation

## 2014-11-02 DIAGNOSIS — R262 Difficulty in walking, not elsewhere classified: Secondary | ICD-10-CM | POA: Insufficient documentation

## 2014-11-02 NOTE — Therapy (Signed)
[facility] [address] [place], [place], [address] Phone: [phone]   Fax:  [phone]  Physical Therapy Treatment  Patient Details  Name: Alyssa Poole MRN: [id number] Date of Birth: 1960/07/16 Referring Provider:  Vickki Hearing, MD  Encounter Date: 11/02/2014      PT End of Session - 11/02/14 1055    Visit Number 3   Number of Visits 18   Date for PT Re-Evaluation 11/05/14   Authorization Type medicare   PT Start Time 1015   PT Stop Time 1103   PT Time Calculation (min) 48 min   Activity Tolerance Patient tolerated treatment well   Behavior During Therapy [facility] for tasks assessed/performed      Past Medical History  Diagnosis Date  . Hypertension   . Anxiety   . Osteoporosis   . GERD (gastroesophageal reflux disease)   . Neuromuscular disorder   . Depression 2002  . Panic attacks 2002  . Asthma     Past Surgical History  Procedure Laterality Date  . Back surgery    . Cervical spine surgery    . Cholecystectomy    . Knee arthroscopy with medial menisectomy Left 03/09/2014    Procedure: LEFT KNEE ARTHROSCOPY WITH PARTIAL MEDIAL MENISECTOMY;  Surgeon: Vickki Hearing, MD;  Location: [facility];  Service: Orthopedics;  Laterality: Left;  . Total knee arthroplasty Left 09/14/2014    Procedure: LEFT TOTAL KNEE ARTHROPLASTY;  Surgeon: Vickki Hearing, MD;  Location: [facility];  Service: Orthopedics;  Laterality: Left;    There were no vitals filed for this visit.  Visit Diagnosis:  Knee stiffness, left  Weakness of left leg  Difficulty walking  Abnormality of gait      Subjective Assessment - 11/02/14 1022    Subjective Pt denies having any kne pain today, states that she is feeling pretty good. She reports that she has remained compliant with HEP.   Currently in Pain? No/denies                 [facility] Adult PT Treatment/Exercise - 11/02/14 0001    Knee/Hip Exercises: Stretches   Active  Hamstring Stretch Both;3 reps;30 seconds   Active Hamstring Stretch Limitations 14in step   Knee: Self-Stretch to increase Flexion 10 seconds   Knee: Self-Stretch Limitations 10 reps at 12" step   Gastroc Stretch 3 reps;30 seconds   Gastroc Stretch Limitations slant board   Knee/Hip Exercises: Aerobic   Stationary Bike 10' seat 8 to increase ROM   Knee/Hip Exercises: Standing   Forward Lunges 10 reps   Forward Lunges Limitations 8" step   Side Lunges 10 reps   Side Lunges Limitations 8" step   Terminal Knee Extension Limitations 10 with red tband   Rocker Board 2 minutes   Rocker Board Limitations R/L and A/P   Knee/Hip Exercises: Supine   Quad Sets 10 reps   Quad Sets Limitations 5 second hold   Heel Slides 15 reps   Heel Slides Limitations x5 with overpressure from PT   Straight Leg Raises 10 reps   Knee/Hip Exercises: Prone   Hamstring Curl 10 reps   Hip Extension 10 reps                  PT Short Term Goals - 10/27/14 1015    PT SHORT TERM GOAL #1   Title Patient will demosntrate full knee extension to decrease limping and normalize stride length   Status On-going  PT SHORT TERM GOAL #2   Title Patient will demosntrate increased knee flexion to >100 degrees to be able to sit to normal height chair for 30 minutes with comfort   Status On-going   PT SHORT TERM GOAL #3   Title patient will demonstrate increased glut med/max strength of 4/5 MMT bilaterally to be able to sit to stand withtou UE assistance 5x in <15 seconds.    Status On-going   PT SHORT TERM GOAL #4   Title Pt pain will be no greater than a 6/10    Status On-going   PT SHORT TERM GOAL #5   Title Patient will state independence with HEP.    Status On-going           PT Long Term Goals - 10/27/14 1016    PT LONG TERM GOAL #1   Title Patient will demosntrate increased knee flexion to >120 degrees to be able to sit to normal height chair for an hour with comfort.  As well as put her shoes and  socks on   PT LONG TERM GOAL #2   Title patient will demonstrate increased glut med/max strength of 5/5 MMT bilaterally to be able to complete steps in a reciprocal manner   PT LONG TERM GOAL #3   Title Pt pain to be no greater than a 3/10 90% of the time   PT LONG TERM GOAL #4   Title Patient will be able to walk withotu knee pain >31minutes.                Plan - 11/02/14 1055    Clinical Impression Statement Treatment focused on improving ROM and LLE strength. Pt continues to demonstrate significant limitations in knee ROM. She was educated on continueing with heel slides and knee drives at home to improve flexion. Pt denied any pain post treatment.    PT Next Visit Plan Continue with ROM activities, functional strengthening        Problem List Patient Active Problem List   Diagnosis Date Noted  . Surgical aftercare, musculoskeletal system 10/11/2014  . Status post total left knee replacement 10/11/2014  . Arthritis of left knee 09/14/2014  . Knee contusion 12/27/2013  . Medial meniscus, posterior horn derangement 12/27/2013  . Arthritis of knee, left 12/27/2013  . Left knee pain 12/27/2013  . 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 KNEE PAIN 05/11/2007  . DISC DEGENERATION 05/11/2007  . SCIATICA 05/11/2007    Leona Singleton, PT, DPT [phone] 11/02/2014, 10:58 AM   [facility] [address] [place], [place], [address] Phone: [phone]   Fax:  [phone]

## 2014-11-04 ENCOUNTER — Ambulatory Visit (HOSPITAL_COMMUNITY): Payer: Medicare Other

## 2014-11-04 DIAGNOSIS — R262 Difficulty in walking, not elsewhere classified: Secondary | ICD-10-CM

## 2014-11-04 DIAGNOSIS — R29898 Other symptoms and signs involving the musculoskeletal system: Secondary | ICD-10-CM

## 2014-11-04 DIAGNOSIS — M25362 Other instability, left knee: Secondary | ICD-10-CM

## 2014-11-04 DIAGNOSIS — M25662 Stiffness of left knee, not elsewhere classified: Secondary | ICD-10-CM | POA: Diagnosis not present

## 2014-11-04 DIAGNOSIS — M1710 Unilateral primary osteoarthritis, unspecified knee: Secondary | ICD-10-CM

## 2014-11-04 DIAGNOSIS — R269 Unspecified abnormalities of gait and mobility: Secondary | ICD-10-CM

## 2014-11-04 NOTE — Therapy (Addendum)
[place], [place], [address] Phone: [phone]   Fax:  [phone]  Physical Therapy Treatment  Patient Details  Name: Alyssa Poole MRN: [id number] Date of Birth: 1960/09/24 Referring Provider:  Carole Civil, MD  Encounter Date: 11/04/2014      PT End of Session - 11/04/14 0932    Visit Number 4   Number of Visits 18   Date for PT Re-Evaluation 11/05/14   Authorization Type medicare   PT Start Time 0932   PT Stop Time 1025   PT Time Calculation (min) 53 min   Activity Tolerance Patient tolerated treatment well   Behavior During Therapy [facility] for tasks assessed/performed      Past Medical History  Diagnosis Date  . Hypertension   . Anxiety   . Osteoporosis   . GERD (gastroesophageal reflux disease)   . Neuromuscular disorder   . Depression 2002  . Panic attacks 2002  . Asthma     Past Surgical History  Procedure Laterality Date  . Back surgery    . Cervical spine surgery    . Cholecystectomy    . Knee arthroscopy with medial menisectomy Left 03/09/2014    Procedure: LEFT KNEE ARTHROSCOPY WITH PARTIAL MEDIAL MENISECTOMY;  Surgeon: Carole Civil, MD;  Location: [facility];  Service: Orthopedics;  Laterality: Left;  . Total knee arthroplasty Left 09/14/2014    Procedure: LEFT TOTAL KNEE ARTHROPLASTY;  Surgeon: Carole Civil, MD;  Location: [facility];  Service: Orthopedics;  Laterality: Left;    There were no vitals filed for this visit.  Visit Diagnosis:  Knee stiffness, left  Weakness of left leg  Difficulty walking  Abnormality of gait  Knee arthropathy  Knee buckling, left      Subjective Assessment - 11/04/14 0931    Subjective Pt stated pain free today, stated it does wake her up at night.   Currently in Pain? No/denies                         [facility] Adult PT Treatment/Exercise - 11/04/14 0001    Exercises   Exercises Knee/Hip   Knee/Hip Exercises:  Stretches   Active Hamstring Stretch Both;3 reps;30 seconds   Active Hamstring Stretch Limitations 14in step   Quad Stretch 3 reps;30 seconds   Quad Stretch Limitations prone with rope   Knee: Self-Stretch to increase Flexion 10 seconds   Knee: Self-Stretch Limitations 10 reps at 12" step   Gastroc Stretch 3 reps;30 seconds   Gastroc Stretch Limitations slant board   Knee/Hip Exercises: Aerobic   Stationary Bike 10' seat 7 to increase ROM   Knee/Hip Exercises: Standing   Heel Raises 15 reps   Heel Raises Limitations Toe raises with HHA   Forward Lunges 10 reps   Forward Lunges Limitations 8" step   Side Lunges 10 reps   Side Lunges Limitations 8" step   Terminal Knee Extension Limitations 10 with red tband   Functional Squat 15 reps   Rocker Board 2 minutes   Rocker Board Limitations R/L and A/P   Knee/Hip Exercises: Supine   Quad Sets 10 reps   Quad Sets Limitations 5 second holds   Short Arc Quad Sets 15 reps   Heel Slides 15 reps   Heel Slides Limitations x5 with overpressure from PT   Terminal Knee Extension 10 reps   Knee/Hip Exercises: Sidelying   Hip ABduction 15 reps   Knee/Hip  Exercises: Prone   Hamstring Curl 15 reps   Hip Extension 15 reps                PT Education - 11/04/14 1014    Education provided Yes   Education Details Scar tissue massage to reduce adhesions   Person(s) Educated Patient   Methods Explanation;Demonstration   Comprehension Verbalized understanding;Returned demonstration          PT Short Term Goals - 11/04/14 8546    PT SHORT TERM GOAL #1   Title Patient will demosntrate full knee extension to decrease limping and normalize stride length   Status On-going   PT SHORT TERM GOAL #2   Title Patient will demosntrate increased knee flexion to >100 degrees to be able to sit to normal height chair for 30 minutes with comfort   Status On-going   PT SHORT TERM GOAL #3   Title patient will demonstrate increased glut med/max  strength of 4/5 MMT bilaterally to be able to sit to stand withtou UE assistance 5x in <15 seconds.    Status On-going   PT SHORT TERM GOAL #4   Title Pt pain will be no greater than a 6/10    Status On-going   PT SHORT TERM GOAL #5   Title Patient will state independence with HEP.    Baseline 11/04/2014 Reports compliance with HEP 2x daily           PT Long Term Goals - 11/04/14 0939    PT LONG TERM GOAL #1   Title Patient will demosntrate increased knee flexion to >120 degrees to be able to sit to normal height chair for an hour with comfort.  As well as put her shoes and socks on   Status On-going   PT LONG TERM GOAL #2   Title patient will demonstrate increased glut med/max strength of 5/5 MMT bilaterally to be able to complete steps in a reciprocal manner   PT LONG TERM GOAL #3   Title Pt pain to be no greater than a 3/10 90% of the time   PT LONG TERM GOAL #4   Title Patient will be able to walk withotu knee pain >52mnutes.                Plan - 11/04/14 1007    Clinical Impression Statement Session focus on improving ROM and Lt LE strengthenig.  Pt improving but does continue to demonstrate significant limitation with knee ROM. AROM 2-105 degrees today.  Pt c/o pain distal quad with quad sets, pt instructed scar tissue massage to reduce adhesions over incision.  No reports of pain through session.   PT Next Visit Plan Continue with ROM activities, functional strengthening        Problem List Patient Active Problem List   Diagnosis Date Noted  . Surgical aftercare, musculoskeletal system 10/11/2014  . Status post total left knee replacement 10/11/2014  . Arthritis of left knee 09/14/2014  . Knee contusion 12/27/2013  . Medial meniscus, posterior horn derangement 12/27/2013  . Arthritis of knee, left 12/27/2013  . Left knee pain 12/27/2013  . 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 KNEE PAIN 05/11/2007  . [place] 05/11/2007  . SCIATICA 05/11/2007    [place] [address]Phone: [phone]  Fax:  [phone] PHYSICAL THERAPY DISCHARGE SUMMARY  Visits from Start of Care: 4  Current functional level related to  goals / functional outcomes: Pt pleased with level of function and feels she is ready for discharge   Remaining deficits: Unknown pt called back after visit to state that she felt she was doing good enough to stop therapy.   Education / Equipment: HEP  Plan: Patient agrees to discharge.  Patient goals were met. Patient is being discharged due to being pleased with the current functional level.  ?????       Rayetta Humphrey, [place] CLT [phone]

## 2014-11-07 ENCOUNTER — Ambulatory Visit (HOSPITAL_COMMUNITY): Payer: Medicare Other | Admitting: Physical Therapy

## 2014-11-09 ENCOUNTER — Ambulatory Visit (HOSPITAL_COMMUNITY): Payer: Medicare Other | Admitting: Physical Therapy

## 2014-11-11 ENCOUNTER — Telehealth (HOSPITAL_COMMUNITY): Payer: Self-pay | Admitting: Physical Therapy

## 2014-11-11 ENCOUNTER — Ambulatory Visit (HOSPITAL_COMMUNITY): Payer: Medicare Other | Admitting: Physical Therapy

## 2014-11-11 NOTE — Telephone Encounter (Signed)
Patient called and was not feeling well yesterday, talked to [place] and she told me to cx appt. I got busy and forgot to cx yesterday.

## 2014-11-14 ENCOUNTER — Ambulatory Visit (HOSPITAL_COMMUNITY): Payer: Medicare Other | Admitting: Physical Therapy

## 2014-11-14 ENCOUNTER — Encounter (HOSPITAL_COMMUNITY): Payer: Self-pay | Admitting: Emergency Medicine

## 2014-11-14 ENCOUNTER — Emergency Department (HOSPITAL_COMMUNITY)
Admission: EM | Admit: 2014-11-14 | Discharge: 2014-11-14 | Disposition: A | Discharge status: Home / Self Care | Payer: Medicare Other | Attending: Emergency Medicine | Admitting: Emergency Medicine

## 2014-11-14 DIAGNOSIS — I10 Essential (primary) hypertension: Secondary | ICD-10-CM | POA: Diagnosis not present

## 2014-11-14 DIAGNOSIS — Y9389 Activity, other specified: Secondary | ICD-10-CM | POA: Insufficient documentation

## 2014-11-14 DIAGNOSIS — Z7982 Long term (current) use of aspirin: Secondary | ICD-10-CM | POA: Insufficient documentation

## 2014-11-14 DIAGNOSIS — F410 Panic disorder [episodic paroxysmal anxiety] without agoraphobia: Secondary | ICD-10-CM | POA: Insufficient documentation

## 2014-11-14 DIAGNOSIS — T63441A Toxic effect of venom of bees, accidental (unintentional), initial encounter: Secondary | ICD-10-CM

## 2014-11-14 DIAGNOSIS — K219 Gastro-esophageal reflux disease without esophagitis: Secondary | ICD-10-CM | POA: Diagnosis not present

## 2014-11-14 DIAGNOSIS — Z79899 Other long term (current) drug therapy: Secondary | ICD-10-CM | POA: Insufficient documentation

## 2014-11-14 DIAGNOSIS — F329 Major depressive disorder, single episode, unspecified: Secondary | ICD-10-CM | POA: Insufficient documentation

## 2014-11-14 DIAGNOSIS — J45909 Unspecified asthma, uncomplicated: Secondary | ICD-10-CM | POA: Insufficient documentation

## 2014-11-14 DIAGNOSIS — Y9289 Other specified places as the place of occurrence of the external cause: Secondary | ICD-10-CM | POA: Insufficient documentation

## 2014-11-14 DIAGNOSIS — Y998 Other external cause status: Secondary | ICD-10-CM | POA: Insufficient documentation

## 2014-11-14 DIAGNOSIS — Z720 Tobacco use: Secondary | ICD-10-CM | POA: Insufficient documentation

## 2014-11-14 MED ORDER — DEXAMETHASONE 4 MG PO TABS: 4.0000 mg | ORAL_TABLET | Freq: Two times a day (BID) | ORAL | Status: DC

## 2014-11-14 MED ORDER — LORATADINE 10 MG PO TABS
10.0000 mg | ORAL_TABLET | Freq: Once | ORAL | Status: AC
  Administered 2014-11-14: 10 mg via ORAL
  Filled 2014-11-14: qty 1

## 2014-11-14 MED ORDER — HYDROXYZINE HCL 25 MG PO TABS: ORAL_TABLET | ORAL | Status: DC

## 2014-11-14 MED ORDER — PREDNISONE 50 MG PO TABS
60.0000 mg | ORAL_TABLET | Freq: Once | ORAL | Status: AC
  Administered 2014-11-14: 60 mg via ORAL
  Filled 2014-11-14 (×2): qty 1

## 2014-11-14 NOTE — ED Provider Notes (Signed)
CSN: [id number]     Arrival date & time 11/14/14  1443 History  This chart was scribed for non-physician practitioner Ivery Quale, PA-C, working with Nelva Nay, MD by Littie Deeds, ED Scribe. This patient was seen in room APFT22/APFT22 and the patient's care was started at 3:44 PM.     Chief Complaint  Patient presents with  . Insect Bite   The history is provided by the patient. No language interpreter was used.   HPI Comments: Alyssa Poole is a 54 y.o. female who presents to the Emergency Department complaining of a bee sting to her right thumb that occurred around 7:00 AM this morning. At this time, patient is complaining of itching and swelling to her right thumb and hand. She applied mayonnaise after being stung which did provide relief to the stinging pain. Patient denies facial swelling, lip swelling, and urticarial rash. She reports having allergies to bees.   Past Medical History  Diagnosis Date  . Hypertension   . Anxiety   . Osteoporosis   . GERD (gastroesophageal reflux disease)   . Neuromuscular disorder   . Depression 2002  . Panic attacks 2002  . Asthma    Past Surgical History  Procedure Laterality Date  . Back surgery    . Cervical spine surgery    . Cholecystectomy    . Knee arthroscopy with medial menisectomy Left 03/09/2014    Procedure: LEFT KNEE ARTHROSCOPY WITH PARTIAL MEDIAL MENISECTOMY;  Surgeon: Vickki Hearing, MD;  Location: [facility];  Service: Orthopedics;  Laterality: Left;  . Total knee arthroplasty Left 09/14/2014    Procedure: LEFT TOTAL KNEE ARTHROPLASTY;  Surgeon: Vickki Hearing, MD;  Location: [facility];  Service: Orthopedics;  Laterality: Left;   No family history on file. Social History  Substance Use Topics  . 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 Smokeless tobacco: Never Used  . Alcohol Use: No   OB History    No data available     Review of Systems   HENT: Negative for facial swelling.   Skin: Negative for rash.  All other systems reviewed and are negative.     Allergies  Bee venom  Home Medications   Prior to Admission medications   Medication Sig Start Date End Date Taking? Authorizing Provider  alendronate (FOSAMAX) 70 MG tablet Take 70 mg by mouth every 7 (seven) days. Takes on Sunday. Take with a full glass of water on an empty stomach.    Historical Provider, MD  aspirin EC 325 MG EC tablet Take 1 tablet (325 mg total) by mouth daily with breakfast. 09/16/14   Vickki Hearing, MD  baclofen (LIORESAL) 10 MG tablet Take 1 tablet (10 mg total) by mouth 3 (three) times daily. Patient not taking: Reported on 08/24/2014 08/10/14   Ivery Quale, PA-C  Black Cohosh 175 MG CAPS Take 175 mg by mouth 3 (three) times daily.    Historical Provider, MD  Calcium Carbonate-Vitamin D (CALCIUM 600 + D PO) Take 1 tablet by mouth daily.      Historical Provider, MD  diclofenac (VOLTAREN) 75 MG EC tablet Take 1 tablet (75 mg total) by mouth 2 (two) times daily. 08/10/14   Ivery Quale, PA-C  gabapentin (NEURONTIN) 300 MG capsule Take 300 mg by mouth 3 (three) times daily.    Historical Provider, MD  HYDROcodone-acetaminophen (NORCO) 10-325 MG per tablet Take 1 tablet by  mouth every 4 (four) hours as needed. 09/26/14   Vickki Hearing, MD  LORazepam (ATIVAN) 0.5 MG tablet Take 0.5 mg by mouth at bedtime. 02/12/14   Historical Provider, MD  losartan-hydrochlorothiazide (HYZAAR) 50-12.5 MG per tablet Take 1 tablet by mouth daily.      Historical Provider, MD  pantoprazole (PROTONIX) 40 MG tablet Take 40 mg by mouth Daily.  01/27/12   Historical Provider, MD  PARoxetine (PAXIL) 20 MG tablet Take 20 mg by mouth daily.     Historical Provider, MD   BP 112/50 mmHg  Pulse 76  Temp(Src) 99.3 F (37.4 C)  Resp 18  Ht  (1.6 m)  Wt 140 lb (63.504 kg)  BMI 24.81 kg/m2  SpO2 98% Physical Exam  Constitutional: She is oriented to person, place,  and time. She appears well-developed and well-nourished. No distress.  HENT:  Head: Normocephalic and atraumatic.  Mouth/Throat: Oropharynx is clear and moist. No oropharyngeal exudate.  Eyes: Pupils are equal, round, and reactive to light.  Neck: Neck supple.  Cardiovascular: Normal rate, regular rhythm and normal heart sounds.  Exam reveals no gallop and no friction rub.   No murmur heard. Normal cardiac.  Pulmonary/Chest: Effort normal and breath sounds normal. No respiratory distress. She has no wheezes. She has no rales.  Normal pulmonary. Patient speaks in complete sentences. Airway is patent.  Musculoskeletal: She exhibits no edema.  No stinger or retained parts in right thumb. Full ROM of the right wrist, elbow, and shoulder.   Neurological: She is alert and oriented to person, place, and time. No cranial nerve deficit.  Skin: Skin is warm and dry. No rash noted.  No rash.  Psychiatric: She has a normal mood and affect. Her behavior is normal.  Nursing note and vitals reviewed.   ED Course  Procedures  DIAGNOSTIC STUDIES: Oxygen Saturation is 98% on room air, normal by my interpretation.    COORDINATION OF CARE: 3:49 PM-Discussed treatment plan which includes medications with patient/guardian at bedside and patient/guardian agreed to plan.    Labs Review Labs Reviewed - No data to display  Imaging Review No results found. I personally reviewed and evaluated these images and lab results as part of my medical decision-making.   EKG Interpretation None      MDM  The patient is awake and alert oriented. She is in no distress whatsoever. There is no evidence for any anaphylactic type reaction. The patient will be treated with short course of Decadron and Claritin and Vistaril. Patient will return if any changes, problems, or concerns.    Final diagnoses:  None    *I have reviewed nursing notes, vital signs, and all appropriate lab and imaging results for this  patient.**  **I personally performed the services described in this documentation, which was scribed in my presence. The recorded information has been reviewed and is accurate.Ivery Quale, PA-C 11/14/14 1609  Linwood Dibbles, MD 11/15/14 [phone]

## 2014-11-14 NOTE — ED Notes (Signed)
Pt c/o bee sting to right thumb at 0700 this am. States she is allergic, denies cp/sob. No Epi or benadryl pta. No swelling noted.

## 2014-11-14 NOTE — Discharge Instructions (Signed)

## 2014-11-16 ENCOUNTER — Ambulatory Visit (HOSPITAL_COMMUNITY): Payer: Medicare Other | Admitting: Physical Therapy

## 2014-11-18 ENCOUNTER — Encounter (HOSPITAL_COMMUNITY): Payer: Medicare Other | Admitting: Physical Therapy

## 2014-11-21 ENCOUNTER — Ambulatory Visit (INDEPENDENT_AMBULATORY_CARE_PROVIDER_SITE_OTHER): Payer: Self-pay | Admitting: Orthopedic Surgery

## 2014-11-21 ENCOUNTER — Encounter: Payer: Self-pay | Admitting: Orthopedic Surgery

## 2014-11-21 VITALS — BP 117/64 | Ht 63.0 in | Wt 140.0 lb

## 2014-11-21 DIAGNOSIS — Z4789 Encounter for other orthopedic aftercare: Secondary | ICD-10-CM

## 2014-11-21 DIAGNOSIS — Z96652 Presence of left artificial knee joint: Secondary | ICD-10-CM

## 2014-11-21 MED ORDER — HYDROCODONE-ACETAMINOPHEN 7.5-325 MG PO TABS: 1.0000 | ORAL_TABLET | Freq: Four times a day (QID) | ORAL | Status: DC | PRN

## 2014-11-21 NOTE — Progress Notes (Signed)
Patient ID: ASUCENA GALER, female   DOB: 10-24-1960, 54 y.o.   MRN: [id number]  Follow up visit  Chief Complaint  Patient presents with  . Follow-up    follow up left total knee replacement, DOS 09/14/14    BP 117/64 mmHg  Ht  (1.6 m)  Wt 140 lb (63.504 kg)  BMI 24.81 kg/m2  Encounter Diagnoses  Name Primary?  . Surgical aftercare, musculoskeletal system Yes  . Status post total left knee replacement     2 months after total knee patient doing well but she doesn't want to go back to therapy she says it hurts she is right at 100 of flexion but I can get her to one 10 very easily of course she says that's uncomfortable. She wants to try it at home I wonder that gets better if she goes to therapy. We refilled her hydrocodone and decreased the dose to 7.5 mg  Follow-up in a month she will do therapy at home  Meds ordered this encounter  Medications  . HYDROcodone-acetaminophen (NORCO) 7.5-325 MG per tablet    Sig: Take 1 tablet by mouth every 6 (six) hours as needed for moderate pain.    Dispense:  84 tablet    Refill:  0

## 2014-11-21 NOTE — Patient Instructions (Signed)
Home PT

## 2014-12-22 ENCOUNTER — Ambulatory Visit (INDEPENDENT_AMBULATORY_CARE_PROVIDER_SITE_OTHER): Payer: Medicare Other | Admitting: Orthopedic Surgery

## 2014-12-22 ENCOUNTER — Ambulatory Visit (INDEPENDENT_AMBULATORY_CARE_PROVIDER_SITE_OTHER): Payer: Medicare Other

## 2014-12-22 VITALS — BP 128/77 | Ht 63.0 in | Wt 140.0 lb

## 2014-12-22 DIAGNOSIS — M25562 Pain in left knee: Secondary | ICD-10-CM

## 2014-12-22 DIAGNOSIS — Z96652 Presence of left artificial knee joint: Secondary | ICD-10-CM

## 2014-12-22 MED ORDER — HYDROCODONE-ACETAMINOPHEN 5-325 MG PO TABS: 1.0000 | ORAL_TABLET | Freq: Four times a day (QID) | ORAL | Status: DC | PRN

## 2014-12-22 NOTE — Progress Notes (Signed)
Patient ID: Alyssa Poole, female   DOB: [birth year], 55 y.o.   MRN: [id number]  Follow up visit  Chief Complaint  Patient presents with  . Follow-up    1 month follow up LT TKA, DOS 09/14/14    BP 128/77 mmHg  Ht  (1.6 m)  Wt 140 lb (63.504 kg)  BMI 24.81 kg/m2  Encounter Diagnosis  Name Primary?  . Status post left knee replacement Yes   She went AGAINST MEDICAL ADVICE and refused therapy she says she fell and the knee folded up under  No swelling no significant palpable tenderness. Knee flexion 8-85. Incision looks good. She is walking but has an abnormal gait because of flexion contracture. Ligaments are stable. Quadriceps muscle tone is normal.  Neurovascular exam is intact  Took an x-ray. No change in the implant  I gave her a warning about the opiate use and decrease the medication to 5 mg. If we can get her off this medicine by December essential have to go to pain management  Recommend follow-up in December

## 2015-01-26 ENCOUNTER — Telehealth: Payer: Self-pay | Admitting: Orthopedic Surgery

## 2015-01-26 NOTE — Telephone Encounter (Signed)
Call received from dentist, Dr Effie ShyWentz' office; [facility], ph# [phone], asking what pre-medication to prescribe for dental services.  Please advise.

## 2015-01-26 NOTE — Telephone Encounter (Signed)
Advised keflex 500mg  4 tabs by mouth prior to dental procedure

## 2015-02-07 ENCOUNTER — Ambulatory Visit: Payer: Medicare Other | Admitting: Orthopedic Surgery

## 2015-03-20 ENCOUNTER — Other Ambulatory Visit (HOSPITAL_COMMUNITY): Payer: Self-pay | Admitting: Family Medicine

## 2015-03-20 DIAGNOSIS — G43911 Migraine, unspecified, intractable, with status migrainosus: Secondary | ICD-10-CM

## 2015-03-23 ENCOUNTER — Encounter: Payer: Self-pay | Admitting: *Deleted

## 2015-03-23 ENCOUNTER — Ambulatory Visit: Payer: Medicare Other | Admitting: Orthopedic Surgery

## 2015-04-05 ENCOUNTER — Ambulatory Visit (HOSPITAL_COMMUNITY)
Admission: RE | Admit: 2015-04-05 | Discharge: 2015-04-05 | Disposition: A | Discharge status: Home / Self Care | Payer: Medicare Other | Source: Ambulatory Visit | Attending: Family Medicine | Admitting: Family Medicine

## 2015-04-05 DIAGNOSIS — G43911 Migraine, unspecified, intractable, with status migrainosus: Secondary | ICD-10-CM

## 2015-04-05 DIAGNOSIS — M542 Cervicalgia: Secondary | ICD-10-CM | POA: Diagnosis not present

## 2015-04-05 DIAGNOSIS — X58XXXD Exposure to other specified factors, subsequent encounter: Secondary | ICD-10-CM | POA: Insufficient documentation

## 2015-04-05 DIAGNOSIS — S12590K Other displaced fracture of sixth cervical vertebra, subsequent encounter for fracture with nonunion: Secondary | ICD-10-CM | POA: Diagnosis not present

## 2015-04-05 DIAGNOSIS — R51 Headache: Secondary | ICD-10-CM | POA: Diagnosis not present

## 2015-06-26 DIAGNOSIS — M5416 Radiculopathy, lumbar region: Secondary | ICD-10-CM | POA: Insufficient documentation

## 2015-06-26 DIAGNOSIS — M810 Age-related osteoporosis without current pathological fracture: Secondary | ICD-10-CM | POA: Insufficient documentation

## 2015-06-26 DIAGNOSIS — M48061 Spinal stenosis, lumbar region without neurogenic claudication: Secondary | ICD-10-CM | POA: Insufficient documentation

## 2015-06-26 DIAGNOSIS — M545 Low back pain, unspecified: Secondary | ICD-10-CM | POA: Insufficient documentation

## 2015-06-27 ENCOUNTER — Other Ambulatory Visit (HOSPITAL_COMMUNITY): Payer: Self-pay | Admitting: Neurosurgery

## 2015-06-27 DIAGNOSIS — M810 Age-related osteoporosis without current pathological fracture: Secondary | ICD-10-CM

## 2015-06-27 DIAGNOSIS — M48061 Spinal stenosis, lumbar region without neurogenic claudication: Secondary | ICD-10-CM

## 2015-06-27 DIAGNOSIS — Z780 Asymptomatic menopausal state: Secondary | ICD-10-CM

## 2015-07-03 ENCOUNTER — Ambulatory Visit (HOSPITAL_COMMUNITY)
Admission: RE | Admit: 2015-07-03 | Discharge: 2015-07-03 | Disposition: A | Discharge status: Home / Self Care | Payer: Medicare Other | Source: Ambulatory Visit | Attending: Neurosurgery | Admitting: Neurosurgery

## 2015-07-03 DIAGNOSIS — Z780 Asymptomatic menopausal state: Secondary | ICD-10-CM | POA: Insufficient documentation

## 2015-07-03 DIAGNOSIS — M48061 Spinal stenosis, lumbar region without neurogenic claudication: Secondary | ICD-10-CM

## 2015-07-03 DIAGNOSIS — M810 Age-related osteoporosis without current pathological fracture: Secondary | ICD-10-CM | POA: Insufficient documentation

## 2015-07-03 DIAGNOSIS — M8580 Other specified disorders of bone density and structure, unspecified site: Secondary | ICD-10-CM | POA: Diagnosis not present

## 2015-07-03 DIAGNOSIS — M4806 Spinal stenosis, lumbar region: Secondary | ICD-10-CM | POA: Diagnosis present

## 2015-07-03 LAB — POCT I-STAT CREATININE: Creatinine, Ser: 1.4 mg/dL — ABNORMAL HIGH (ref 0.44–1.00)

## 2015-07-03 MED ORDER — GADOBENATE DIMEGLUMINE 529 MG/ML IV SOLN
7.0000 mL | Freq: Once | INTRAVENOUS | Status: AC | PRN
  Administered 2015-07-03: 7 mL via INTRAVENOUS

## 2015-10-20 ENCOUNTER — Emergency Department (HOSPITAL_COMMUNITY): Payer: Medicare Other

## 2015-10-20 ENCOUNTER — Emergency Department (HOSPITAL_COMMUNITY)
Admission: EM | Admit: 2015-10-20 | Discharge: 2015-10-20 | Disposition: A | Discharge status: Home / Self Care | Payer: Medicare Other | Attending: Emergency Medicine | Admitting: Emergency Medicine

## 2015-10-20 ENCOUNTER — Encounter (HOSPITAL_COMMUNITY): Payer: Self-pay | Admitting: *Deleted

## 2015-10-20 DIAGNOSIS — L03032 Cellulitis of left toe: Secondary | ICD-10-CM | POA: Insufficient documentation

## 2015-10-20 DIAGNOSIS — J45909 Unspecified asthma, uncomplicated: Secondary | ICD-10-CM | POA: Insufficient documentation

## 2015-10-20 DIAGNOSIS — M25562 Pain in left knee: Secondary | ICD-10-CM | POA: Diagnosis not present

## 2015-10-20 DIAGNOSIS — F17200 Nicotine dependence, unspecified, uncomplicated: Secondary | ICD-10-CM | POA: Insufficient documentation

## 2015-10-20 DIAGNOSIS — Z79899 Other long term (current) drug therapy: Secondary | ICD-10-CM | POA: Insufficient documentation

## 2015-10-20 DIAGNOSIS — Z7982 Long term (current) use of aspirin: Secondary | ICD-10-CM | POA: Diagnosis not present

## 2015-10-20 DIAGNOSIS — F329 Major depressive disorder, single episode, unspecified: Secondary | ICD-10-CM | POA: Insufficient documentation

## 2015-10-20 DIAGNOSIS — I10 Essential (primary) hypertension: Secondary | ICD-10-CM | POA: Diagnosis not present

## 2015-10-20 DIAGNOSIS — M79675 Pain in left toe(s): Secondary | ICD-10-CM | POA: Diagnosis present

## 2015-10-20 MED ORDER — SULFAMETHOXAZOLE-TRIMETHOPRIM 800-160 MG PO TABS: 1.0000 | ORAL_TABLET | Freq: Once | ORAL | Status: DC

## 2015-10-20 MED ORDER — CEPHALEXIN 500 MG PO CAPS
500.0000 mg | ORAL_CAPSULE | Freq: Once | ORAL | Status: AC
  Administered 2015-10-20: 500 mg via ORAL
  Filled 2015-10-20: qty 1

## 2015-10-20 MED ORDER — CEPHALEXIN 500 MG PO CAPS: 500.0000 mg | ORAL_CAPSULE | Freq: Four times a day (QID) | ORAL | Status: DC

## 2015-10-20 NOTE — Discharge Instructions (Signed)
Paronychia °Paronychia is an infection of the skin that surrounds a nail. It usually affects the skin around a fingernail, but it may also occur near a toenail. It often causes pain and swelling around the nail. This condition may come on suddenly or develop over a longer period. In some cases, a collection of pus (abscess) can form near or under the nail. Usually, paronychia is not serious and it clears up with treatment. °CAUSES °This condition may be caused by bacteria or fungi. It is commonly caused by either Streptococcus or Staphylococcus bacteria. The bacteria or fungi often cause the infection by getting into the affected area through an opening in the skin, such as a cut or a hangnail. °RISK FACTORS °This condition is more likely to develop in: °· People who get their hands wet often, such as those who work as dishwashers, bartenders, or nurses. °· People who bite their fingernails or suck their thumbs. °· People who trim their nails too short. °· People who have hangnails or injured fingertips. °· People who get manicures. °· People who have diabetes. °SYMPTOMS °Symptoms of this condition include: °· Redness and swelling of the skin near the nail. °· Tenderness around the nail when you touch the area. °· Pus-filled bumps under the cuticle. The cuticle is the skin at the base or sides of the nail. °· Fluid or pus under the nail. °· Throbbing pain in the area. °DIAGNOSIS °This condition is usually diagnosed with a physical exam. In some cases, a sample of pus may be taken from an abscess to be tested in a lab. This can help to determine what type of bacteria or fungi is causing the condition. °TREATMENT °Treatment for this condition depends on the cause and severity of the condition. If the condition is mild, it may clear up on its own in a few days. Your health care provider may recommend soaking the affected area in warm water a few times a day. When treatment is needed, the options may  include: °· Antibiotic medicine, if the condition is caused by a bacterial infection. °· Antifungal medicine, if the condition is caused by a fungal infection. °· Incision and drainage, if an abscess is present. In this procedure, the health care provider will cut open the abscess so the pus can drain out. °HOME CARE INSTRUCTIONS °· Soak the affected area in warm water if directed to do so by your health care provider. You may be told to do this for 20 minutes, 2-3 times a day. Keep the area dry in between soakings. °· Take medicines only as directed by your health care provider. °· If you were prescribed an antibiotic medicine, finish all of it even if you start to feel better. °· Keep the affected area clean. °· Do not try to drain a fluid-filled bump yourself. °· If you will be washing dishes or performing other tasks that require your hands to get wet, wear rubber gloves. You should also wear gloves if your hands might come in contact with irritating substances, such as cleaners or chemicals. °· Follow your health care provider's instructions about: °¨ Wound care. °¨ Bandage (dressing) changes and removal. °SEEK MEDICAL CARE IF: °· Your symptoms get worse or do not improve with treatment. °· You have a fever or chills. °· You have redness spreading from the affected area. °· You have continued or increased fluid, blood, or pus coming from the affected area. °· Your finger or knuckle becomes swollen or is difficult to move. °  °  This information is not intended to replace advice given to you by your health care provider. Make sure you discuss any questions you have with your health care provider. °  °Document Released: 09/11/2000 Document Revised: 08/02/2014 Document Reviewed: 02/23/2014 °[employer]vier Interactive Patient Education ©2016 [employer] ° °

## 2015-10-20 NOTE — ED Provider Notes (Signed)
CSN: [id number]     Arrival date & time 10/20/15  2001 History   First MD Initiated Contact with Patient 10/20/15 2047     Chief Complaint  Patient presents with  . Toe Pain     (Consider location/radiation/quality/duration/timing/severity/associated sxs/prior Treatment) The history is provided by the patient.   Alyssa Poole is a 55 y.o. female presenting with infection in her toes.  She reports having an infection around the 2nd and 3rd toenails of her left foot weeks ago which has improved with warm water epsom salt soaks but resulted in loss of her 2nd toenail.  She now has tenderness and yellow drainage from around her great toenail of this same foot.  She has been using warm epsom salt soaks and reports squeezing the toe 2 days ago and a small amount of pus came out from the nail edge.  She additionally reports pain on the medial aspect of her left knee for the past week.  She had a total knee arthroplasty one year ago and she has concerns for infection in this joint given her toe infections.  She denies any increased swelling in the knee (chronic swelling since surgery) also denies redness or increased pain with flexion. She denies fevers or chills or other complaints.     Past Medical History  Diagnosis Date  . Hypertension   . Anxiety   . Osteoporosis   . GERD (gastroesophageal reflux disease)   . Neuromuscular disorder (HCC)   . Depression 2002  . Panic attacks 2002  . Asthma    Past Surgical History  Procedure Laterality Date  . Back surgery    . Cervical spine surgery    . Cholecystectomy    . Knee arthroscopy with medial menisectomy Left 03/09/2014    Procedure: LEFT KNEE ARTHROSCOPY WITH PARTIAL MEDIAL MENISECTOMY;  Surgeon: Vickki HearingStanley E Harrison, MD;  Location: [facility];  Service: Orthopedics;  Laterality: Left;  . Total knee arthroplasty Left 09/14/2014    Procedure: LEFT TOTAL KNEE ARTHROPLASTY;  Surgeon: Vickki HearingStanley E Harrison, MD;  Location: [facility];  Service:  Orthopedics;  Laterality: Left;   History reviewed. No pertinent family history. Social History  Substance Use Topics  . 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 Smokeless tobacco: Never Used  . Alcohol Use: No   OB History    No data available     Review of Systems  Constitutional: Negative for fever.  Musculoskeletal: Positive for arthralgias. Negative for myalgias and joint swelling.  Skin:       Negative except as mentioned in HPI.    Neurological: Negative for weakness and numbness.      Allergies  Bee venom  Home Medications   Prior to Admission medications   Medication Sig Start Date End Date Taking? Authorizing Provider  alendronate (FOSAMAX) 70 MG tablet Take 70 mg by mouth every 7 (seven) days. Takes on Sunday. Take with a full glass of water on an empty stomach.   Yes Historical Provider, MD  aspirin EC 81 MG tablet Take 81 mg by mouth daily.   Yes Historical Provider, MD  Black Cohosh 175 MG CAPS Take 175 mg by mouth 3 (three) times daily.   Yes Historical Provider, MD  citalopram (CELEXA) 20 MG tablet Take 20 mg by mouth daily. 10/11/15  Yes Historical Provider, MD  [employer] CALCIUM 600+D 600-800 MG-UNIT TABS Take 1 tablet by mouth daily. 10/08/15  Yes Historical Provider, MD  gabapentin (NEURONTIN) 300 MG capsule Take 300 mg by mouth daily as needed (for neuropathy).    Yes Historical Provider, MD  HYDROcodone-acetaminophen (NORCO) 10-325 MG tablet Take 1 tablet by mouth 4 (four) times daily.   Yes Historical Provider, MD  losartan-hydrochlorothiazide (HYZAAR) 50-12.5 MG per tablet Take 1 tablet by mouth daily.     Yes Historical Provider, MD  Multiple Vitamin (MULTIVITAMIN WITH MINERALS) TABS tablet Take 1 tablet by mouth daily.   Yes Historical Provider, MD  naproxen (NAPROSYN) 500 MG tablet Take 500 mg by mouth 2 (two) times daily with a meal.   Yes Historical Provider, MD  pantoprazole (PROTONIX)  40 MG tablet Take 40 mg by mouth Daily.  01/27/12  Yes Historical Provider, MD  topiramate (TOPAMAX) 100 MG tablet Take 100 mg by mouth at bedtime. 09/22/15  Yes Historical Provider, MD  cephALEXin (KEFLEX) 500 MG capsule Take 1 capsule (500 mg total) by mouth 4 (four) times daily. 10/20/15   Burgess Amor, PA-C   BP 115/64 mmHg  Pulse 69  Temp(Src) 98.5 F (36.9 C) (Oral)  Resp 16  Ht  (1.6 m)  Wt 63.504 kg  BMI 24.81 kg/m2  SpO2 100% Physical Exam  Constitutional: She appears well-developed and well-nourished.  HENT:  Head: Atraumatic.  Neck: Normal range of motion.  Cardiovascular:  Pulses equal bilaterally  Musculoskeletal: She exhibits tenderness. She exhibits no edema.       Left knee: She exhibits normal range of motion, no effusion, no deformity, no erythema, no LCL laxity and no MCL laxity. Tenderness found. Medial joint line tenderness noted.       Left foot: There is normal capillary refill.  Neurological: She is alert. She has normal strength. She displays normal reflexes. No sensory deficit.  Skin: Skin is warm and dry.  Dried yellow discharge from around left great toenail.  No fluctuance or visible pus collection along nail folds. Plantar toe soft.    Psychiatric: She has a normal mood and affect.    ED Course  Procedures (including critical care time) Labs Review Labs Reviewed - No data to display  Imaging Review Dg Knee Complete 4 Views Left  10/20/2015  CLINICAL DATA:  Intermittent left knee pain since surgery 1 year ago. EXAM: LEFT KNEE - COMPLETE 4+ VIEW COMPARISON:  12/22/2014 FINDINGS: Well-seated total knee arthroplasty. No subluxation or fracture. No focal osseous finding. IMPRESSION: 1. No acute finding. 2. Stable appearance of total knee arthroplasty. Electronically Signed   By: Marnee Spring M.D.   On: 10/20/2015 22:35   I have personally reviewed and evaluated these images and lab results as part of my medical decision-making.   EKG  Interpretation None      MDM   Final diagnoses:  Paronychia of great toe of left foot  Knee pain, acute, left    Pt with toenail infection, suspect bacterial, no thickening of nails to suggest fungal infection.  She was encouraged to continue epsom salt soaks, keflex prescribed.  Knee films negative for acute source of knee pain, no effusion.  Knee is not warm, pt displays FROM of the knee fairly easily , no crepitus, highly unlikely to be septic joint given degree of ROM.  Advised recheck by pcp this week if sx persist or worsen.    Burgess Amor, PA-C 10/21/15 0116  Samuel Jester, DO 10/21/15 2159

## 2015-10-20 NOTE — ED Notes (Signed)
Pt reports that her left big toe has had pus coming out of it on Wednesday. Pt also c/o left knee pain as well.

## 2015-12-08 ENCOUNTER — Ambulatory Visit (INDEPENDENT_AMBULATORY_CARE_PROVIDER_SITE_OTHER): Payer: Medicare Other

## 2015-12-08 ENCOUNTER — Ambulatory Visit (INDEPENDENT_AMBULATORY_CARE_PROVIDER_SITE_OTHER): Payer: Medicare Other | Admitting: Orthopedic Surgery

## 2015-12-08 VITALS — BP 116/61 | HR 64 | Ht 62.0 in | Wt 132.0 lb

## 2015-12-08 DIAGNOSIS — M25562 Pain in left knee: Secondary | ICD-10-CM

## 2015-12-08 DIAGNOSIS — L089 Local infection of the skin and subcutaneous tissue, unspecified: Secondary | ICD-10-CM

## 2015-12-08 NOTE — Progress Notes (Signed)
Patient ID: Alyssa Poole, female   DOB: 1960/12/31, 55 y.o.   MRN: [id number]  Chief Complaint  Patient presents with  . Knee Pain    Left knee pain, Left knee replacement, DOS 09-14-14.    HPI Alyssa Poole is a 55 y.o. female.  Status post left total knee.  Recent toe infection left great toe treated with antibiotics. The first round of antibiotics she stopped when the toe started feeling better but did not complete the bottle she went to the ER was placed back on Keflex 500 mg 4 times a day has 1 pill left her toe has improved  She complains of severe left knee pain which was noted at the time her toe was hurting so she is concerned about infection  Review of Systems Review of Systems 1. No fever no chills 2. Lumbar spine lower back pain   Past Medical History:  Diagnosis Date  . Anxiety   . Asthma   . Depression 2002  . GERD (gastroesophageal reflux disease)   . Hypertension   . Neuromuscular disorder (HCC)   . Osteoporosis   . Panic attacks 2002    Past Surgical History:  Procedure Laterality Date  . BACK SURGERY    . CERVICAL SPINE SURGERY    . CHOLECYSTECTOMY    . KNEE ARTHROSCOPY WITH MEDIAL MENISECTOMY Left 03/09/2014   Procedure: LEFT KNEE ARTHROSCOPY WITH PARTIAL MEDIAL MENISECTOMY;  Surgeon: Vickki HearingStanley E Jemma Rasp, MD;  Location: [facility];  Service: Orthopedics;  Laterality: Left;  . TOTAL KNEE ARTHROPLASTY Left 09/14/2014   Procedure: LEFT TOTAL KNEE ARTHROPLASTY;  Surgeon: Vickki HearingStanley E Billey Wojciak, MD;  Location: [facility];  Service: Orthopedics;  Laterality: Left;    Social History Social History  Substance Use Topics  . Smoking status: Current Some Day Smoker    Packs/day: 1.50    Years: 41.00    Last attempt to quit: 09/08/2014  . Smokeless tobacco: Never Used  . Alcohol use No    Allergies  Allergen Reactions  . Bee Venom Swelling    Causes bad swelling. No epipen  needed    No outpatient prescriptions have been marked as taking for the 12/08/15 encounter  (Office Visit) with Vickki HearingStanley E Azarius Lambson, MD.      Physical Exam Physical Exam BP 116/61   Pulse 64   Ht 5\' 2"  (1.575 m)   Wt 132 lb (59.9 kg)   BMI 24.14 kg/m   Gen. appearance Normal grooming and hygiene ectomorphic body habitus The patient is alert and oriented person place and time Mood is normal affect is normal Ambulatory status no significant limp  Exam of the lumbar spine is tender in the lower back and left SI joint left buttock  Inspection left knee looks normal incision looks normal is no erythema there is no warmth ROM 95 of flexion Stability stable in the coronal and sagittal plane Strength muscle tone and strength in extension normal  Skin: Skin incision clean dry and intact no erythema  Pulses: Normal dorsal pulse  Neuro: Normal sensation  Lumbar spine tender as stated   Data Reviewed X-ray I personally read the x-ray is normal left knee   Assessment    Lumbar spine related left knee pain  X-ray was normal of the left knee. Clinical exam shows back pain she has to deteriorating disc L4-5 and L5-S1 most likely cause of her symptomatic left knee. However we will get labs to prove there is no infection of the knee one week after  she's been off Keflex for her toe infection    Plan    CBC sedimentation rate C-reactive proteinOn the 18th follow-up on the 22nd         [name] 12/08/2015, 11:48 AM

## 2015-12-19 LAB — SEDIMENTATION RATE: SED RATE: 1 mm/h (ref 0–30)

## 2015-12-19 LAB — CBC
HCT: 30.1 % — ABNORMAL LOW (ref 35.0–45.0)
HEMOGLOBIN: 9.8 g/dL — AB (ref 11.7–15.5)
MCH: 30.8 pg (ref 27.0–33.0)
MCHC: 32.6 g/dL (ref 32.0–36.0)
MCV: 94.7 fL (ref 80.0–100.0)
MPV: 10.1 fL (ref 7.5–12.5)
PLATELETS: 413 10*3/uL — AB (ref 140–400)
RBC: 3.18 MIL/uL — AB (ref 3.80–5.10)
RDW: 14 % (ref 11.0–15.0)
WBC: 13.1 10*3/uL — ABNORMAL HIGH (ref 3.8–10.8)

## 2015-12-21 LAB — C-REACTIVE PROTEIN: CRP: 0.5 mg/L (ref <8.0)

## 2015-12-22 ENCOUNTER — Ambulatory Visit (INDEPENDENT_AMBULATORY_CARE_PROVIDER_SITE_OTHER): Payer: Medicare Other | Admitting: Orthopedic Surgery

## 2015-12-22 ENCOUNTER — Encounter: Payer: Self-pay | Admitting: Orthopedic Surgery

## 2015-12-22 VITALS — BP 120/66 | HR 73 | Ht 62.0 in | Wt 134.0 lb

## 2015-12-22 DIAGNOSIS — Z96652 Presence of left artificial knee joint: Secondary | ICD-10-CM

## 2015-12-22 NOTE — Progress Notes (Signed)
Chief Complaint  Patient presents with  . Follow-up    left knee, lab results    Sedimentation rate and C-reactive protein are both normal.  The patient is probably getting radicular pain from her degenerative disc disease  It took x-rays last week and they were normal  Patient will follow-up for routine annual left total knee x-ray next year

## 2016-04-02 ENCOUNTER — Ambulatory Visit (HOSPITAL_COMMUNITY)
Admission: RE | Admit: 2016-04-02 | Discharge: 2016-04-02 | Disposition: A | Discharge status: Home / Self Care | Payer: Medicare Other | Source: Ambulatory Visit | Attending: Family Medicine | Admitting: Family Medicine

## 2016-04-02 ENCOUNTER — Other Ambulatory Visit (HOSPITAL_COMMUNITY): Payer: Self-pay | Admitting: Family Medicine

## 2016-04-02 DIAGNOSIS — M19011 Primary osteoarthritis, right shoulder: Secondary | ICD-10-CM | POA: Diagnosis not present

## 2016-04-02 DIAGNOSIS — R52 Pain, unspecified: Secondary | ICD-10-CM

## 2016-04-02 DIAGNOSIS — M25511 Pain in right shoulder: Secondary | ICD-10-CM | POA: Diagnosis present

## 2016-05-28 ENCOUNTER — Ambulatory Visit: Payer: Medicare Other | Admitting: Orthopedic Surgery

## 2016-06-13 ENCOUNTER — Encounter (HOSPITAL_COMMUNITY): Payer: Self-pay | Admitting: *Deleted

## 2016-06-13 ENCOUNTER — Emergency Department (HOSPITAL_COMMUNITY)
Admission: EM | Admit: 2016-06-13 | Discharge: 2016-06-13 | Disposition: A | Discharge status: Home / Self Care | Payer: Medicare Other | Attending: Emergency Medicine | Admitting: Emergency Medicine

## 2016-06-13 ENCOUNTER — Emergency Department (HOSPITAL_COMMUNITY): Payer: Medicare Other

## 2016-06-13 DIAGNOSIS — M25512 Pain in left shoulder: Secondary | ICD-10-CM

## 2016-06-13 DIAGNOSIS — J45909 Unspecified asthma, uncomplicated: Secondary | ICD-10-CM | POA: Diagnosis not present

## 2016-06-13 DIAGNOSIS — F17200 Nicotine dependence, unspecified, uncomplicated: Secondary | ICD-10-CM | POA: Insufficient documentation

## 2016-06-13 DIAGNOSIS — Z7982 Long term (current) use of aspirin: Secondary | ICD-10-CM | POA: Insufficient documentation

## 2016-06-13 DIAGNOSIS — M19012 Primary osteoarthritis, left shoulder: Secondary | ICD-10-CM | POA: Insufficient documentation

## 2016-06-13 MED ORDER — DEXAMETHASONE SODIUM PHOSPHATE 4 MG/ML IJ SOLN
8.0000 mg | Freq: Once | INTRAMUSCULAR | Status: AC
  Administered 2016-06-13: 8 mg via INTRAMUSCULAR
  Filled 2016-06-13: qty 2

## 2016-06-13 MED ORDER — PROMETHAZINE HCL 12.5 MG PO TABS
12.5000 mg | ORAL_TABLET | Freq: Once | ORAL | Status: AC
  Administered 2016-06-13: 12.5 mg via ORAL
  Filled 2016-06-13: qty 1

## 2016-06-13 MED ORDER — DEXAMETHASONE 4 MG PO TABS: 4.0000 mg | ORAL_TABLET | Freq: Two times a day (BID) | ORAL | 0 refills | Status: DC

## 2016-06-13 MED ORDER — MORPHINE SULFATE (PF) 4 MG/ML IV SOLN
8.0000 mg | Freq: Once | INTRAVENOUS | Status: AC
  Administered 2016-06-13: 8 mg via INTRAMUSCULAR
  Filled 2016-06-13: qty 2

## 2016-06-13 NOTE — Discharge Instructions (Signed)
Your vital signs within normal limits. The x-ray of your shoulder reveals arthritis present. There are no signs of fracture or dislocation. Please continue your current arthritis medication and pain medication. We will add Decadron 2 times daily. Please see the orthopedic specialist as sone as possible concerning your shoulder. Heating pad to the shoulder may also be helpful.

## 2016-06-13 NOTE — ED Triage Notes (Signed)
Pt c/o left shoulder pain that started 1 month ago. Pt takes Lortab at home with no relief. Denies injury.

## 2016-06-13 NOTE — ED Provider Notes (Signed)
[facility] Provider Note   CSN: [id number] Arrival date & time: 06/13/16  1102     History   Chief Complaint Chief Complaint  Patient presents with  . Shoulder Pain    HPI Alyssa Poole is a 56 y.o. female.  Patient is a 56 year old female who presents to the emergency department with a complaint of left shoulder pain.  The patient states she has had problems with her left shoulder on and off for nearly a month. She has been treated by her primary physician with pain medication and arthritis medication. The patient states the pain is getting progressively worse. The patient has not been evaluated by orthopedics at this time. She's not had any recent injury or trauma to the left shoulder. She frequently feels and hears a pop in her shoulder, she denies dropping objects at this time. No chest pain, no shortness of breath, no hemoptysis reported. She presents now because she states that the pain is getting worse and she can't rest with this pain.      Past Medical History:  Diagnosis Date  . Anxiety   . Asthma   . Depression 2002  . GERD (gastroesophageal reflux disease)   . Hypertension   . Neuromuscular disorder (HCC)   . Osteoporosis   . Panic attacks 2002    Patient Active Problem List   Diagnosis Date Noted  . Surgical aftercare, musculoskeletal system 10/11/2014  . Status post total left knee replacement 10/11/2014  . Arthritis of left knee 09/14/2014  . Knee contusion 12/27/2013  . Medial meniscus, posterior horn derangement 12/27/2013  . Arthritis of knee, left 12/27/2013  . Left knee pain 12/27/2013  . 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 KNEE PAIN 05/11/2007  . DISC DEGENERATION 05/11/2007  . SCIATICA 05/11/2007    Past Surgical History:  Procedure Laterality Date  . BACK SURGERY    . CERVICAL SPINE SURGERY    . CHOLECYSTECTOMY    . KNEE ARTHROSCOPY WITH MEDIAL MENISECTOMY Left 03/09/2014   Procedure: LEFT KNEE ARTHROSCOPY WITH PARTIAL  MEDIAL MENISECTOMY;  Surgeon: Vickki Hearing, MD;  Location: [facility];  Service: Orthopedics;  Laterality: Left;  . TOTAL KNEE ARTHROPLASTY Left 09/14/2014   Procedure: LEFT TOTAL KNEE ARTHROPLASTY;  Surgeon: Vickki Hearing, MD;  Location: [facility];  Service: Orthopedics;  Laterality: Left;    OB History    No data available       Home Medications    Prior to Admission medications   Medication Sig Start Date End Date Taking? Authorizing Provider  alendronate (FOSAMAX) 70 MG tablet Take 70 mg by mouth every 7 (seven) days. Takes on Sunday. Take with a full glass of water on an empty stomach.   Yes Historical Provider, MD  aspirin EC 81 MG tablet Take 81 mg by mouth daily.   Yes Historical Provider, MD  Black Cohosh 175 MG CAPS Take 175 mg by mouth 3 (three) times daily.   Yes Historical Provider, MD  citalopram (CELEXA) 20 MG tablet Take 20 mg by mouth daily. 10/11/15  Yes Historical Provider, MD  [employer] CALCIUM 600+D 600-800 MG-UNIT TABS Take 1 tablet by mouth daily. 10/08/15  Yes Historical Provider, MD  gabapentin (NEURONTIN) 300 MG capsule Take 300 mg by mouth daily as needed (for neuropathy).    Yes Historical Provider, MD  HYDROcodone-acetaminophen (NORCO) 10-325 MG tablet Take 1 tablet by mouth 4 (four) times daily.   Yes Historical Provider, MD  losartan-hydrochlorothiazide (HYZAAR) 50-12.5 MG per tablet Take 1  tablet by mouth daily.     Yes Historical Provider, MD  Multiple Vitamin (MULTIVITAMIN WITH MINERALS) TABS tablet Take 1 tablet by mouth daily.   Yes Historical Provider, MD  naproxen (NAPROSYN) 500 MG tablet Take 500 mg by mouth 2 (two) times daily with a meal.   Yes Historical Provider, MD  pantoprazole (PROTONIX) 40 MG tablet Take 40 mg by mouth Daily.  01/27/12  Yes Historical Provider, MD  topiramate (TOPAMAX) 100 MG tablet Take 100 mg by mouth at bedtime. 09/22/15  Yes Historical Provider, MD    Family History No family history on file.  Social History Social History    Substance Use Topics  . Smoking status: Current Some Day Smoker    Packs/day: 1.50    Years: 41.00    Last attempt to quit: 09/08/2014  . Smokeless tobacco: Never Used  . Alcohol use No     Allergies   Bee venom   Review of Systems Review of Systems  Respiratory: Negative for cough, chest tightness and shortness of breath.   Musculoskeletal: Positive for arthralgias and back pain.  All other systems reviewed and are negative.    Physical Exam Updated Vital Signs BP 112/55 (BP Location: Left Arm)   Pulse 67   Temp 98 F (36.7 C) (Oral)   Resp 18   Ht 5\' 2"  (1.575 m)   Wt 58.5 kg   SpO2 98%   BMI 23.59 kg/m   Physical Exam  Constitutional: She is oriented to person, place, and time. She appears well-developed and well-nourished.  Non-toxic appearance.  HENT:  Head: Normocephalic.  Right Ear: Tympanic membrane and external ear normal.  Left Ear: Tympanic membrane and external ear normal.  Eyes: EOM and lids are normal. Pupils are equal, round, and reactive to light.  Neck: Normal range of motion. Neck supple. Carotid bruit is not present.  Cardiovascular: Normal rate, regular rhythm, normal heart sounds, intact distal pulses and normal pulses.   Pulmonary/Chest: Breath sounds normal. No respiratory distress.  Abdominal: Soft. Bowel sounds are normal. There is no tenderness. There is no guarding.  Musculoskeletal: Normal range of motion.  There is no deformity of the left shoulder. The left shoulder is not hot. There is some crepitus with attempted range of motion. There is pain with attempted range of motion. There is full range of motion of the left elbow, wrist, and fingers. Radial pulses 2+.  Lymphadenopathy:       Head (right side): No submandibular adenopathy present.       Head (left side): No submandibular adenopathy present.    She has no cervical adenopathy.  Neurological: She is alert and oriented to person, place, and time. She has normal strength. No  cranial nerve deficit or sensory deficit.  Skin: Skin is warm and dry.  Psychiatric: She has a normal mood and affect. Her speech is normal.  Nursing note and vitals reviewed.    ED Treatments / Results  Labs (all labs ordered are listed, but only abnormal results are displayed) Labs Reviewed - No data to display  EKG  EKG Interpretation None       Radiology No results found.  Procedures Procedures (including critical care time)  Medications Ordered in ED Medications - No data to display   Initial Impression / Assessment and Plan / ED Course  I have reviewed the triage vital signs and the nursing notes.  Pertinent labs & imaging results that were available during my care of the patient were  reviewed by me and considered in my medical decision making (see chart for details).     **I have reviewed nursing notes, vital signs, and all appropriate lab and imaging results for this patient.*  Final Clinical Impressions(s) / ED Diagnoses MDM Vital signs within normal limits. Pulse oximetry is 98% on room air. X-ray of the shoulder shows osteoarthritic changes. There's also noted old trauma with remodeling of the left clavicle. No acute fracture or dislocation appreciated.  I discussed the findings of the x-ray with the patient in terms which he understands. The plan at this time will be for the patient to receive intramuscular Decadron, and intramuscular morphine for her pain. I've asked patient to continue her current medication. Will add Decadron to this. I strongly encouraged patient to see orthopedics for additional evaluation and management of this pain an issue.    Final diagnoses:  None    New Prescriptions New Prescriptions   No medications on file     Ivery Quale, PA-C 06/13/16 1222    Samuel Jester, DO 06/13/16 1527

## 2016-06-17 ENCOUNTER — Telehealth: Payer: Self-pay | Admitting: Orthopedic Surgery

## 2016-06-17 NOTE — Telephone Encounter (Signed)
Patient called (voice message initially received Friday, 06/14/16 - requests appointment [facility] problem.  I returned patient's call on that day, left voice message, and I called back, left message again today, Monday, 06/17/16, for patient to call back to schedule.

## 2016-06-18 NOTE — Telephone Encounter (Signed)
Patient returned call on 06/17/16. Appointment scheduled, patient aware.

## 2016-07-02 ENCOUNTER — Encounter: Payer: Self-pay | Admitting: Orthopedic Surgery

## 2016-07-02 ENCOUNTER — Ambulatory Visit (INDEPENDENT_AMBULATORY_CARE_PROVIDER_SITE_OTHER): Payer: Medicare Other

## 2016-07-02 ENCOUNTER — Ambulatory Visit (INDEPENDENT_AMBULATORY_CARE_PROVIDER_SITE_OTHER): Payer: Medicare Other | Admitting: Orthopedic Surgery

## 2016-07-02 VITALS — BP 100/59 | HR 71 | Ht 62.0 in | Wt 124.0 lb

## 2016-07-02 DIAGNOSIS — M542 Cervicalgia: Secondary | ICD-10-CM

## 2016-07-02 DIAGNOSIS — M7552 Bursitis of left shoulder: Secondary | ICD-10-CM

## 2016-07-02 MED ORDER — PREDNISONE 10 MG PO TABS: 10.0000 mg | ORAL_TABLET | Freq: Three times a day (TID) | ORAL | 0 refills | Status: DC

## 2016-07-02 NOTE — Patient Instructions (Signed)
Apply heating pad left shoulder 3 times a day for 20 minutes  Therapy on left shoulder

## 2016-07-02 NOTE — Progress Notes (Signed)
Patient ID: Alyssa Poole, female   DOB: 1960/06/04, 56 y.o.   MRN: [id number]  Chief Complaint  Patient presents with  . Shoulder Pain    left shoulder pain    HPI Alyssa Poole is a 56 y.o. female.    56 year old female with atraumatic onset of left shoulder pain which required her to go to the ER when she couldn't get an appointment with our office. She complains of severe dull aching left shoulder pain unresponsive to hydrocodone 10 mg associated with loss of motion and weakness of the left shoulder  She had a cervical discectomy several years ago and has some mild neck pain    Review of Systems Review of Systems Normal neuro  Denies fever   Past Medical History:  Diagnosis Date  . Anxiety   . Asthma   . Depression 2002  . GERD (gastroesophageal reflux disease)   . Hypertension   . Neuromuscular disorder (HCC)   . Osteoporosis   . Panic attacks 2002    Past Surgical History:  Procedure Laterality Date  . BACK SURGERY    . CERVICAL SPINE SURGERY    . CHOLECYSTECTOMY    . KNEE ARTHROSCOPY WITH MEDIAL MENISECTOMY Left 03/09/2014   Procedure: LEFT KNEE ARTHROSCOPY WITH PARTIAL MEDIAL MENISECTOMY;  Surgeon: Vickki Hearing, MD;  Location: [facility];  Service: Orthopedics;  Laterality: Left;  . TOTAL KNEE ARTHROPLASTY Left 09/14/2014   Procedure: LEFT TOTAL KNEE ARTHROPLASTY;  Surgeon: Vickki Hearing, MD;  Location: [facility];  Service: Orthopedics;  Laterality: Left;      No family history on file. The patient was quizzed about their family history and reported no history of bleeding problems or anesthesia problems in their family  Social History Social History  Substance Use Topics  . Smoking status: Current Some Day Smoker    Packs/day: 1.50    Years: 41.00    Last attempt to quit: 09/08/2014  . Smokeless tobacco: Never Used  . Alcohol use No    Allergies  Allergen Reactions  . Bee Venom Swelling    Causes bad swelling. No epipen  needed    Current  Outpatient Prescriptions  Medication Sig Dispense Refill  . alendronate (FOSAMAX) 70 MG tablet Take 70 mg by mouth every 7 (seven) days. Takes on Sunday. Take with a full glass of water on an empty stomach.    Marland Kitchen aspirin EC 81 MG tablet Take 81 mg by mouth daily.    . Black Cohosh 175 MG CAPS Take 175 mg by mouth 3 (three) times daily.    . citalopram (CELEXA) 20 MG tablet Take 20 mg by mouth daily.  6  . [employer] CALCIUM 600+D 600-800 MG-UNIT TABS Take 1 tablet by mouth daily.  11  . gabapentin (NEURONTIN) 300 MG capsule Take 300 mg by mouth daily as needed (for neuropathy).     Marland Kitchen HYDROcodone-acetaminophen (NORCO) 10-325 MG tablet Take 1 tablet by mouth 4 (four) times daily.    Marland Kitchen losartan-hydrochlorothiazide (HYZAAR) 50-12.5 MG per tablet Take 1 tablet by mouth daily.      . Multiple Vitamin (MULTIVITAMIN WITH MINERALS) TABS tablet Take 1 tablet by mouth daily.    . naproxen (NAPROSYN) 500 MG tablet Take 500 mg by mouth 2 (two) times daily with a meal.    . pantoprazole (PROTONIX) 40 MG tablet Take 40 mg by mouth Daily.     Marland Kitchen topiramate (TOPAMAX) 100 MG tablet Take 100 mg by mouth at bedtime.  3  No current facility-administered medications for this visit.        Physical Exam BP (!) 100/59   Pulse 71   Ht  (1.575 m)   Wt 124 lb (56.2 kg)   BMI 22.68 kg/m  Physical Exam The patient is well developed well nourished and well groomed.  Orientation to person place and time is normal  Mood is pleasant.  Ambulatory status Remains normal  Cervical spine exam is as follows tenderness at the base of the cervical spine and decreased range of motion  Ortho Exam Left shoulder  Examination: Inspection reveals tenderness around the peri-acromial region. The patient has decreased range of motion passive FLEXION is 150 with painful range of motion throughout the range of motion 45 EXT ROT  and grade 5/ 5 motor function of the rotator cuff. Stability in abduction external rotation is  normal.   Neurovascular examination is intact and the lymph nodes in the axilla and supraclavicular regions are normal   The opposite shoulder right exhibits normal range of motion stability and strength neurovascular exam is intact, lymph nodes are negative and there is no swelling or tenderness   Data Reviewed IMAGING: [facility]/LAT left Shoulder: I have read and interpret the x-ray as follows:   no specific changes C-spine film  Assessment    Left SHOULDER  ROTATOR CUFF SYNDROME with bursitis SHOULDER PAIN   Procedure note the subacromial injection shoulder left   Verbal consent was obtained to inject the  Left   Shoulder  Timeout was completed to confirm the injection site is a subacromial space of the  left  shoulder  Medication used Depo-Medrol 40 mg and lidocaine 1% 3 cc  Anesthesia was provided by ethyl chloride  The injection was performed in the left  posterior subacromial space. After pinning the skin with alcohol and anesthetized the skin with ethyl chloride the subacromial space was injected using a 20-gauge needle. There were no complications  Sterile dressing was applied.            Plan    NSAIDS Therapy INJECTION MRI F/U    [name], MD 07/02/2016 4:09 PM

## 2016-07-28 ENCOUNTER — Other Ambulatory Visit: Payer: Self-pay | Admitting: Orthopedic Surgery

## 2016-07-30 ENCOUNTER — Emergency Department (HOSPITAL_COMMUNITY)
Admission: EM | Admit: 2016-07-30 | Discharge: 2016-07-30 | Disposition: A | Discharge status: Home / Self Care | Payer: Medicare Other | Attending: Emergency Medicine | Admitting: Emergency Medicine

## 2016-07-30 ENCOUNTER — Emergency Department (HOSPITAL_COMMUNITY): Payer: Medicare Other

## 2016-07-30 DIAGNOSIS — Y939 Activity, unspecified: Secondary | ICD-10-CM | POA: Diagnosis not present

## 2016-07-30 DIAGNOSIS — I10 Essential (primary) hypertension: Secondary | ICD-10-CM | POA: Insufficient documentation

## 2016-07-30 DIAGNOSIS — S92344A Nondisplaced fracture of fourth metatarsal bone, right foot, initial encounter for closed fracture: Secondary | ICD-10-CM | POA: Insufficient documentation

## 2016-07-30 DIAGNOSIS — M84374A Stress fracture, right foot, initial encounter for fracture: Secondary | ICD-10-CM

## 2016-07-30 DIAGNOSIS — Z79899 Other long term (current) drug therapy: Secondary | ICD-10-CM | POA: Diagnosis not present

## 2016-07-30 DIAGNOSIS — X58XXXA Exposure to other specified factors, initial encounter: Secondary | ICD-10-CM | POA: Insufficient documentation

## 2016-07-30 DIAGNOSIS — F17200 Nicotine dependence, unspecified, uncomplicated: Secondary | ICD-10-CM | POA: Insufficient documentation

## 2016-07-30 DIAGNOSIS — Z7982 Long term (current) use of aspirin: Secondary | ICD-10-CM | POA: Diagnosis not present

## 2016-07-30 DIAGNOSIS — Y929 Unspecified place or not applicable: Secondary | ICD-10-CM | POA: Diagnosis not present

## 2016-07-30 DIAGNOSIS — J45909 Unspecified asthma, uncomplicated: Secondary | ICD-10-CM | POA: Diagnosis not present

## 2016-07-30 DIAGNOSIS — Y999 Unspecified external cause status: Secondary | ICD-10-CM | POA: Insufficient documentation

## 2016-07-30 DIAGNOSIS — S99921A Unspecified injury of right foot, initial encounter: Secondary | ICD-10-CM | POA: Diagnosis present

## 2016-07-30 NOTE — ED Provider Notes (Addendum)
[facility] Provider Note   CSN: [id number] Arrival date & time: 07/30/16  1349     History   Chief Complaint Chief Complaint  Patient presents with  . Foot Pain    HPI Alyssa Poole is a 56 y.o. female.  Patient is a 56 year old female who presents to the emergency department with complaint of right foot pain.  The patient states this problem has been going on for nearly 2 weeks. She is having increasing pain of her right foot. The pain is particularly intense over the fourth and fifth toe area. The patient does not recall stepping on any foreign objects. She's not had anything to step on oral fall on her right foot. It is of note that she had a fracture of her fifth toe approximately 10 years ago, but states she is really not had much problem from that injury. She has some minor aches and pains involving her hip and knees, but no severe pain. She presents at this time for evaluation as the pain in her foot seems to be getting worse and is beginning to bother her activities of daily living.   The history is provided by the patient.    Past Medical History:  Diagnosis Date  . Anxiety   . Asthma   . Depression 2002  . GERD (gastroesophageal reflux disease)   . Hypertension   . Neuromuscular disorder (HCC)   . Osteoporosis   . Panic attacks 2002    Patient Active Problem List   Diagnosis Date Noted  . Surgical aftercare, musculoskeletal system 10/11/2014  . Status post total left knee replacement 10/11/2014  . Arthritis of left knee 09/14/2014  . Knee contusion 12/27/2013  . Medial meniscus, posterior horn derangement 12/27/2013  . Arthritis of knee, left 12/27/2013  . Left knee pain 12/27/2013  . 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 KNEE PAIN 05/11/2007  . DISC DEGENERATION 05/11/2007  . SCIATICA 05/11/2007    Past Surgical History:  Procedure Laterality Date  . BACK SURGERY    . CERVICAL SPINE SURGERY    . CHOLECYSTECTOMY    . KNEE ARTHROSCOPY WITH  MEDIAL MENISECTOMY Left 03/09/2014   Procedure: LEFT KNEE ARTHROSCOPY WITH PARTIAL MEDIAL MENISECTOMY;  Surgeon: Vickki Hearing, MD;  Location: [facility];  Service: Orthopedics;  Laterality: Left;  . TOTAL KNEE ARTHROPLASTY Left 09/14/2014   Procedure: LEFT TOTAL KNEE ARTHROPLASTY;  Surgeon: Vickki Hearing, MD;  Location: [facility];  Service: Orthopedics;  Laterality: Left;    OB History    No data available       Home Medications    Prior to Admission medications   Medication Sig Start Date End Date Taking? Authorizing Provider  alendronate (FOSAMAX) 70 MG tablet Take 70 mg by mouth every 7 (seven) days. Takes on Sunday. Take with a full glass of water on an empty stomach.   Yes Historical Provider, MD  aspirin EC 81 MG tablet Take 81 mg by mouth daily.   Yes Historical Provider, MD  Black Cohosh 175 MG CAPS Take 175 mg by mouth 3 (three) times daily.   Yes Historical Provider, MD  cetirizine (ZYRTEC) 10 MG tablet Take 10 mg by mouth daily.   Yes Historical Provider, MD  citalopram (CELEXA) 20 MG tablet Take 20 mg by mouth daily. 10/11/15  Yes Historical Provider, MD  [employer] CALCIUM 600+D 600-800 MG-UNIT TABS Take 1 tablet by mouth daily. 10/08/15  Yes Historical Provider, MD  gabapentin (NEURONTIN) 300 MG capsule Take 300 mg  by mouth daily as needed (for neuropathy).    Yes Historical Provider, MD  HYDROcodone-acetaminophen (NORCO) 10-325 MG tablet Take 1 tablet by mouth 4 (four) times daily.   Yes Historical Provider, MD  losartan-hydrochlorothiazide (HYZAAR) 50-12.5 MG per tablet Take 1 tablet by mouth daily.     Yes Historical Provider, MD  Multiple Vitamin (MULTIVITAMIN WITH MINERALS) TABS tablet Take 1 tablet by mouth daily.   Yes Historical Provider, MD  naproxen (NAPROSYN) 500 MG tablet Take 500 mg by mouth 2 (two) times daily with a meal.   Yes Historical Provider, MD  pantoprazole (PROTONIX) 40 MG tablet Take 40 mg by mouth Daily.  01/27/12  Yes Historical Provider, MD  predniSONE  (DELTASONE) 10 MG tablet TAKE 1 TABLET (10 MG TOTAL) BY MOUTH 3 (THREE) TIMES DAILY. 07/29/16  Yes Vickki Hearing, MD  topiramate (TOPAMAX) 100 MG tablet Take 100 mg by mouth at bedtime. 09/22/15  Yes Historical Provider, MD    Family History No family history on file.  Social History Social History  Substance Use Topics  . Smoking status: Current Some Day Smoker    Packs/day: 1.50    Years: 41.00    Last attempt to quit: 09/08/2014  . Smokeless tobacco: Never Used  . Alcohol use No     Allergies   Bee venom   Review of Systems Review of Systems  Musculoskeletal: Positive for arthralgias.  All other systems reviewed and are negative.    Physical Exam Updated Vital Signs BP 119/66 (BP Location: Right Arm)   Pulse 89   Temp 98.6 F (37 C) (Oral)   Resp 15   Ht  (1.575 m)   Wt 58.5 kg   SpO2 99%   BMI 23.59 kg/m   Physical Exam  Constitutional: She is oriented to person, place, and time. She appears well-developed and well-nourished.  Non-toxic appearance.  HENT:  Head: Normocephalic.  Right Ear: Tympanic membrane and external ear normal.  Left Ear: Tympanic membrane and external ear normal.  Eyes: EOM and lids are normal. Pupils are equal, round, and reactive to light.  Neck: Normal range of motion. Neck supple. Carotid bruit is not present.  Cardiovascular: Normal rate, regular rhythm, normal heart sounds, intact distal pulses and normal pulses.   Pulmonary/Chest: Breath sounds normal. No respiratory distress.  Abdominal: Soft. Bowel sounds are normal. There is no tenderness. There is no guarding.  Musculoskeletal: Normal range of motion.       Right foot: There is tenderness and bony tenderness. There is normal capillary refill.       Feet:  There no puncture wounds to the plantar surface right foot.. There no lesions between the toes. There is pain to palpation and movement of the fourth and fifth toe on the right foot.  Lymphadenopathy:       Head  (right side): No submandibular adenopathy present.       Head (left side): No submandibular adenopathy present.    She has no cervical adenopathy.  Neurological: She is alert and oriented to person, place, and time. She has normal strength. No cranial nerve deficit or sensory deficit.  Skin: Skin is warm and dry.  Psychiatric: She has a normal mood and affect. Her speech is normal.  Nursing note and vitals reviewed.    ED Treatments / Results  Labs (all labs ordered are listed, but only abnormal results are displayed) Labs Reviewed - No data to display  EKG  EKG Interpretation None  Radiology Dg Foot Complete Right  Result Date: 07/30/2016 CLINICAL DATA:  Foot pain.  No known injury. EXAM: RIGHT FOOT COMPLETE - 3+ VIEW COMPARISON:  09/19/2013.  08/29/2011 FINDINGS: Nondisplaced fracture at the base of the right fourth metatarsal is noted. This could be a stress fracture. No other focal abnormalities identified. Diffuse degenerative change. No radiopaque foreign bodies. IMPRESSION: Tiny nondisplaced fracture of the base of the right fourth metatarsal is noted. This could be a stress fracture. Electronically Signed   By: Maisie Fus  Register   On: 07/30/2016 15:01    Procedures FRACTURE CARE. Marland KitchenSplint Application Date/Time: 07/30/2016 3:26 PM Performed by: Ivery Quale Authorized by: Ivery Quale   Consent:    Consent obtained:  Verbal   Consent given by:  Patient   Risks discussed:  Numbness, pain and swelling   Alternatives discussed:  Referral Pre-procedure details:    Sensation:  Normal Procedure details:    Laterality:  Right   Location:  Foot   Foot:  R foot   Splint type: watson-jones splint.   Supplies:  Cotton padding and elastic bandage (Post Op shoe.) Post-procedure details:    Pain:  Improved   Sensation:  Normal   Skin color:  Normal with 2+ cap refill   Patient tolerance of procedure:  Tolerated well, no immediate complications   (including critical  care time)  Medications Ordered in ED Medications - No data to display   Initial Impression / Assessment and Plan / ED Course  I have reviewed the triage vital signs and the nursing notes.  Pertinent labs & imaging results that were available during my care of the patient were reviewed by me and considered in my medical decision making (see chart for details).     **I have reviewed nursing notes, vital signs, and all appropriate lab and imaging results for this patient.*  Final Clinical Impressions(s) / ED Diagnoses  MDM  Vital signs within normal limits. Pulse oximetry is 99% on room air. Within normal limits by my interpretation. The x-ray of the right foot shows a nondisplaced fracture of the fourth metatarsal. It is believed that this may be a stress fracture.  I discussed the fracture with the patient in terms which he understands. The patient has been fitted with a Watson-Jones splint. She will see Dr. Romeo Apple for additional orthopedic evaluation in the office. The patient is already taking medication for pain, and will use this medication if needed.    Final diagnoses:  Stress fracture of metatarsal bone of right foot, initial encounter    New Prescriptions New Prescriptions   No medications on file     Ivery Quale, PA-C 07/30/16 1534    Marily Memos, MD 07/30/16 1536    Ivery Quale, PA-C 08/09/16 2217    Mesner, Barbara Cower, MD 08/10/16 [phone]

## 2016-07-30 NOTE — ED Triage Notes (Signed)
Pt.'s right foot is very sore. Broke her pinky toe 10 years before, but has had no complications since. Her last 2 toes have been in pain for the last 2 weeks. Has tried Tylenol, but hasn't helped.

## 2016-07-30 NOTE — ED Notes (Signed)
Pt

## 2016-07-30 NOTE — ED Notes (Signed)
Pt going to xray  

## 2016-07-30 NOTE — ED Notes (Signed)
PA at the bedside to review xray

## 2016-07-30 NOTE — Discharge Instructions (Signed)
Your x-ray reveals a fracture of the fourth toe. Please keep your splint clean and dry. Please see Dr. Romeo Apple in the office for with PV take evaluation and management as soon as possible. Please keep your foot elevated above your waist. Please see Dr. Romeo Apple or return to the emergency department if any emergent changes, problems, or concerns.

## 2016-08-05 ENCOUNTER — Ambulatory Visit (INDEPENDENT_AMBULATORY_CARE_PROVIDER_SITE_OTHER): Payer: Medicare Other | Admitting: Orthopedic Surgery

## 2016-08-05 ENCOUNTER — Encounter: Payer: Self-pay | Admitting: Orthopedic Surgery

## 2016-08-05 VITALS — BP 117/77 | HR 89 | Ht 62.0 in | Wt 133.0 lb

## 2016-08-05 DIAGNOSIS — M84374A Stress fracture, right foot, initial encounter for fracture: Secondary | ICD-10-CM | POA: Diagnosis not present

## 2016-08-05 NOTE — Patient Instructions (Signed)
WEAR shoe for 6 weeks

## 2016-08-05 NOTE — Progress Notes (Signed)
  NEW PATIENT OFFICE VISIT    Chief Complaint  Patient presents with  . Foot Injury    right 4th metatarsal fx    HPI 56 year old female presents with new onset of pain over the fourth metatarsal of the right foot. She describes dull constant pain with no history of trauma ROS Facial swelling from steroids  Left shoulder no longer having pain never got therapy therapy never called her back  Past Medical History:  Diagnosis Date  . Anxiety   . Asthma   . Depression 2002  . GERD (gastroesophageal reflux disease)   . Hypertension   . Neuromuscular disorder (HCC)   . Osteoporosis   . Panic attacks 2002    Past Surgical History:  Procedure Laterality Date  . BACK SURGERY    . CERVICAL SPINE SURGERY    . CHOLECYSTECTOMY    . KNEE ARTHROSCOPY WITH MEDIAL MENISECTOMY Left 03/09/2014   Procedure: LEFT KNEE ARTHROSCOPY WITH PARTIAL MEDIAL MENISECTOMY;  Surgeon: Vickki HearingStanley E Katrina Brosh, MD;  Location: [facility];  Service: Orthopedics;  Laterality: Left;  . TOTAL KNEE ARTHROPLASTY Left 09/14/2014   Procedure: LEFT TOTAL KNEE ARTHROPLASTY;  Surgeon: Vickki HearingStanley E Theda Payer, MD;  Location: [facility];  Service: Orthopedics;  Laterality: Left;    No family history on file. Social History  Substance Use Topics  . Smoking status: Current Some Day Smoker    Packs/day: 1.50    Years: 41.00    Last attempt to quit: 09/08/2014  . Smokeless tobacco: Never Used  . Alcohol use No    BP 117/77   Pulse 89   Ht 5\' 2"  (1.575 m)   Wt 133 lb (60.3 kg)   BMI 24.33 kg/m   Physical Exam  Constitutional: She is oriented to person, place, and time. She appears well-developed and well-nourished.  Neurological: She is alert and oriented to person, place, and time.  Psychiatric: She has a normal mood and affect.  Vitals reviewed.   Ortho Exam Ambulatory status walking with a limp.  No malalignment in the foot tender over the fourth metatarsal ankle range of motion and metatarsophalangeal motion normal skin  no ecchymosis sensation normal pulses good motor exam no atrophy ankle stable  Left foot normal alignment     Encounter Diagnosis  Name Primary?  . Stress fracture of metatarsal bone of right foot, initial encounter Yes     PLAN:  Hard sole shoe 6 weeks x-ray 6 weeks

## 2016-08-10 IMAGING — DX DG KNEE COMPLETE 4+V*L*
4 series · 4 of 4 positions shown · non-contrast
Comparison: 12/22/2014

CLINICAL DATA: Intermittent left knee pain since surgery 1 year
ago.

EXAM:
LEFT KNEE - COMPLETE 4+ VIEW

[knee ap]
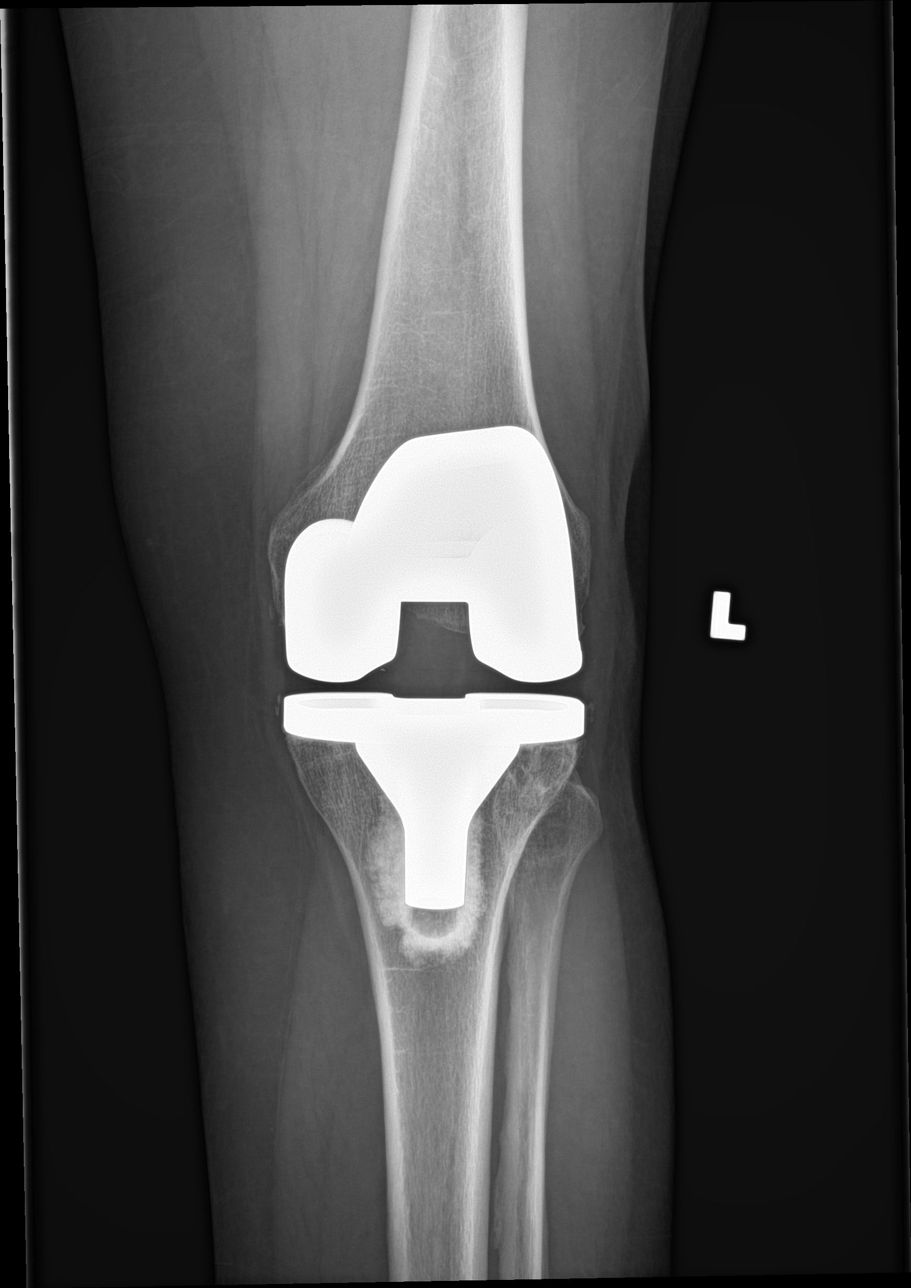

[knee obl (1 of 2)]
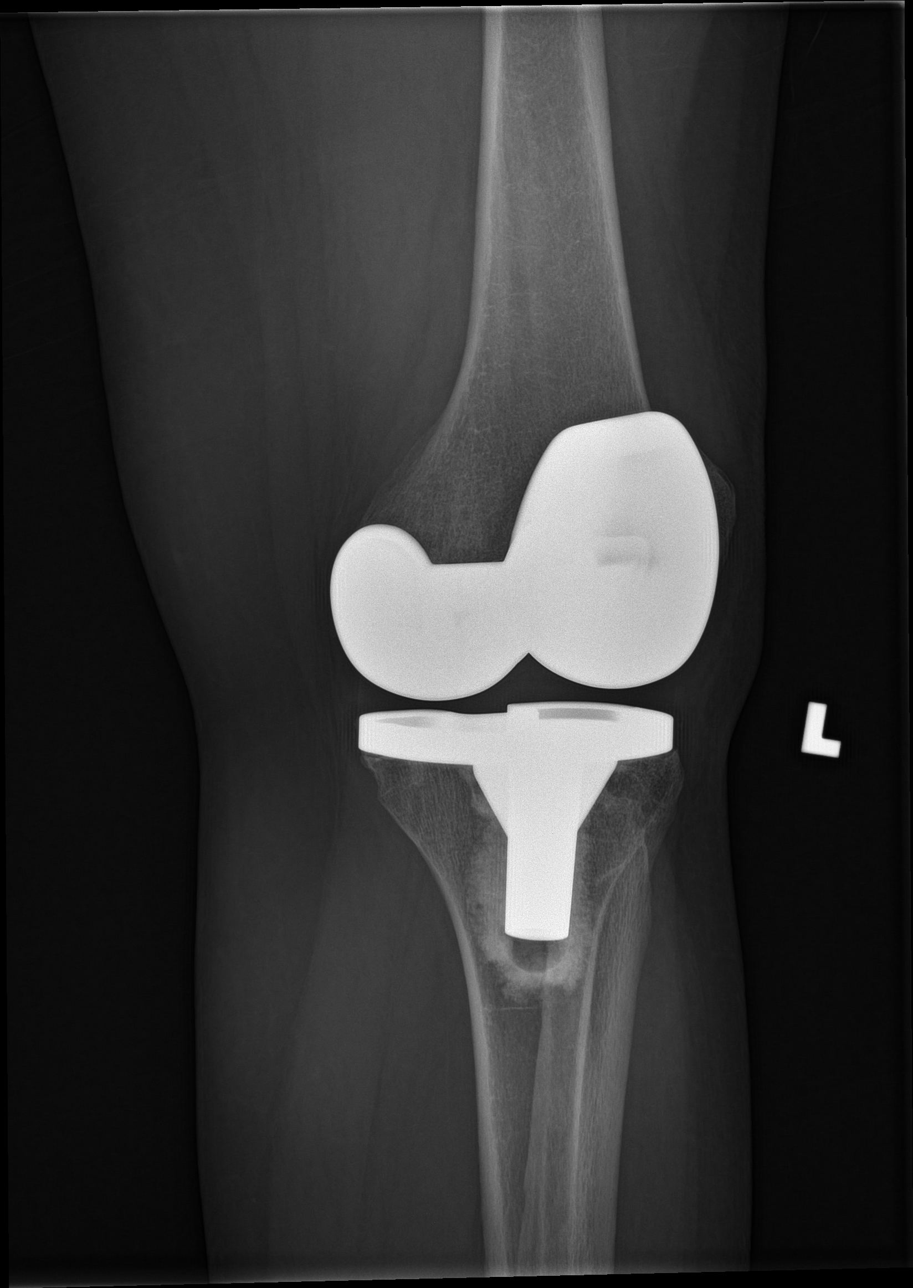

[knee obl (2 of 2)]
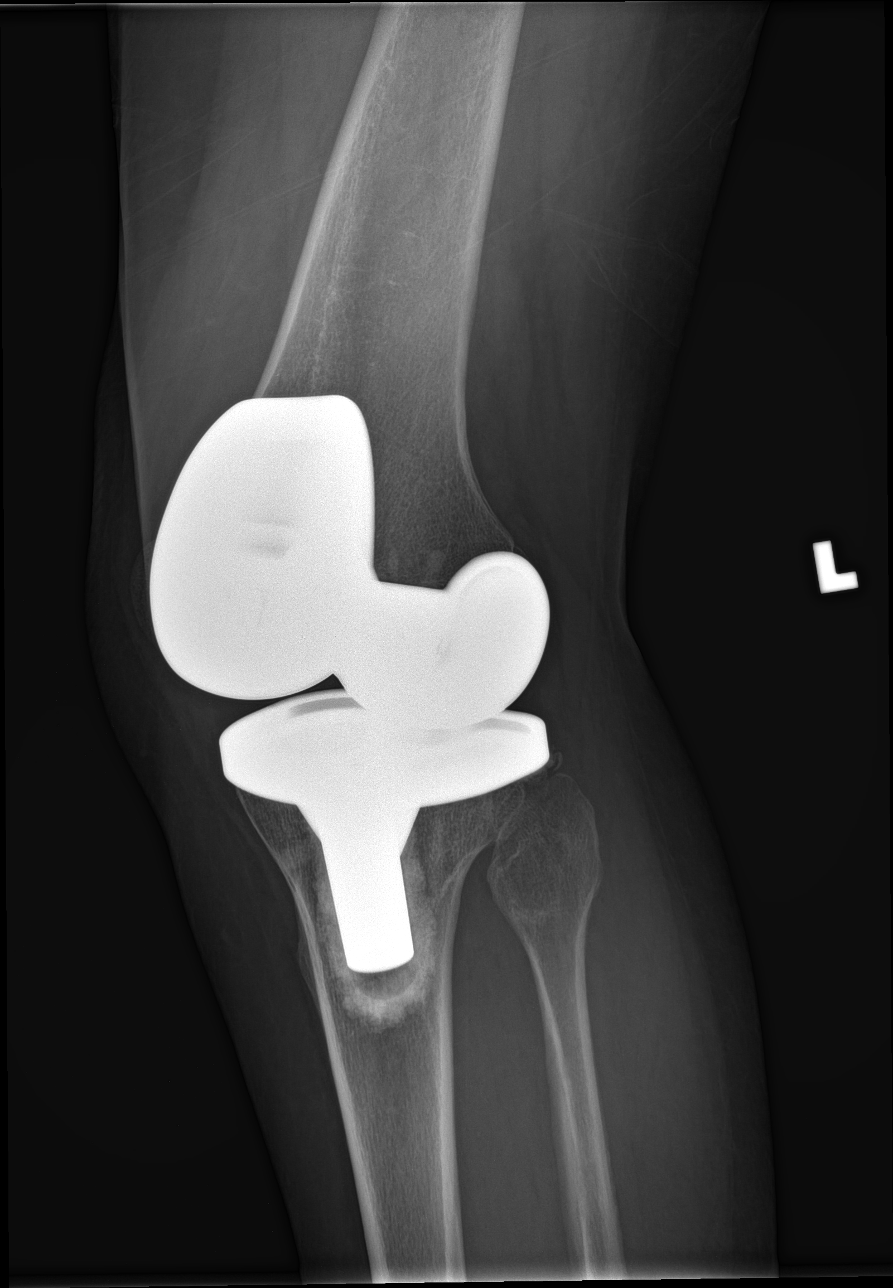

[knee lat]
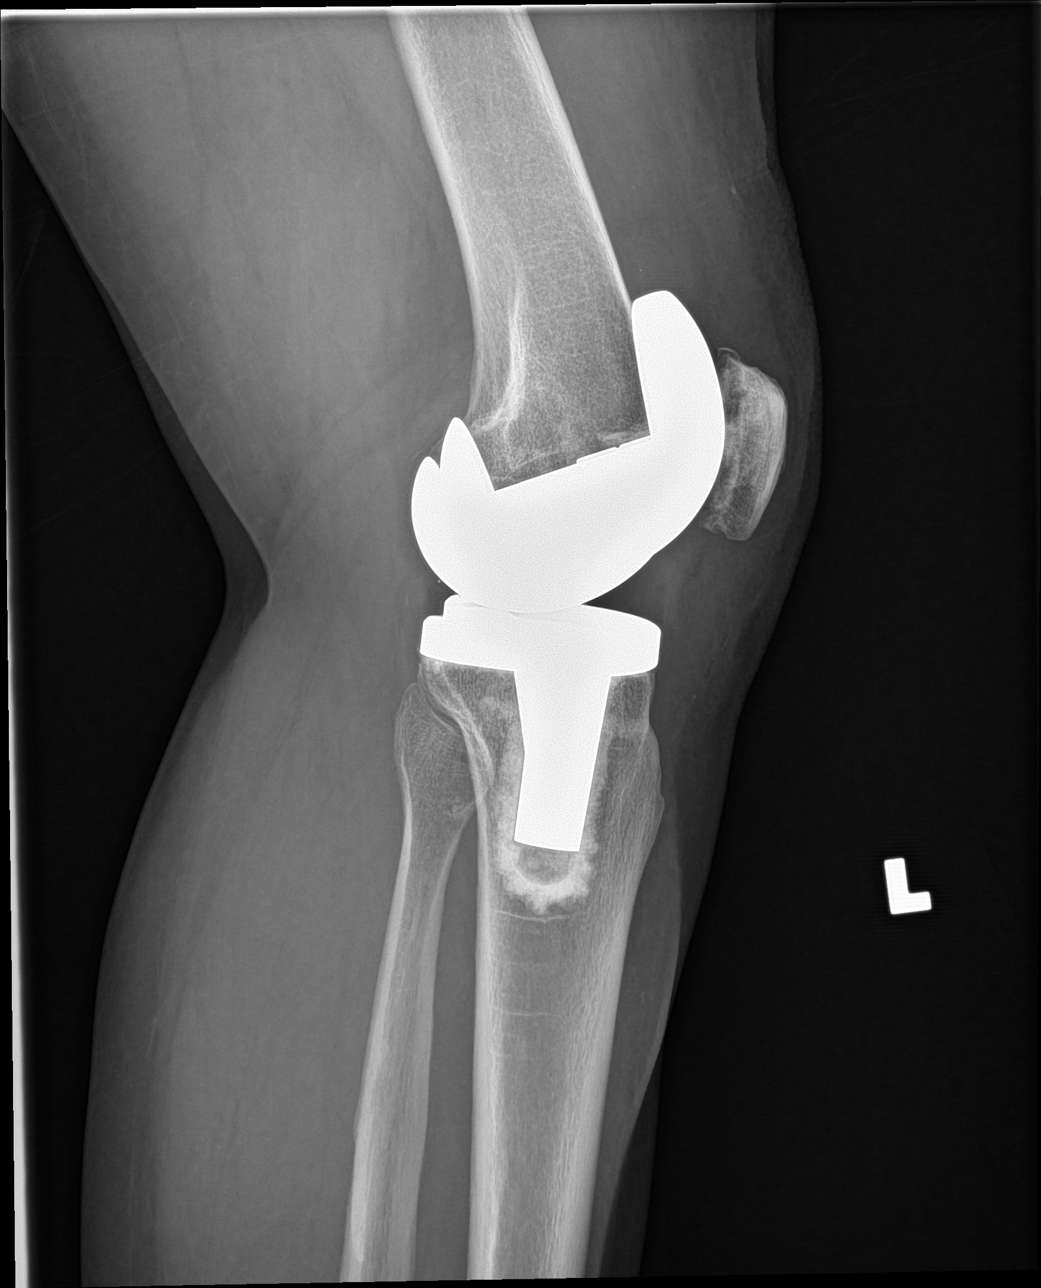

[4 of 4 positions shown; findings below may reference images not displayed]

FINDINGS: Well-seated total knee arthroplasty. No subluxation or fracture. No
focal osseous finding.
IMPRESSION: 1. No acute finding.
2. Stable appearance of total knee arthroplasty.

## 2016-08-25 ENCOUNTER — Other Ambulatory Visit: Payer: Self-pay | Admitting: Orthopedic Surgery

## 2016-09-03 ENCOUNTER — Ambulatory Visit (INDEPENDENT_AMBULATORY_CARE_PROVIDER_SITE_OTHER): Payer: Medicare Other | Admitting: Orthopedic Surgery

## 2016-09-03 ENCOUNTER — Encounter: Payer: Self-pay | Admitting: Orthopedic Surgery

## 2016-09-03 DIAGNOSIS — M7552 Bursitis of left shoulder: Secondary | ICD-10-CM | POA: Diagnosis not present

## 2016-09-03 MED ORDER — PREDNISONE 10 MG PO TABS: 10.0000 mg | ORAL_TABLET | Freq: Every day | ORAL | 0 refills | Status: DC

## 2016-09-03 NOTE — Progress Notes (Signed)
Patient ID: Silvano RuskBobbie W Poole, female   DOB: 03/27/1961, 56 y.o.   MRN: [id number]  Chief Complaint  Patient presents with  . Follow-up    LEFT SHOULDER    56 year old female had a previous cervical fusion treated for bursitis of left shoulder with injection. She says she is not any better.    Review of Systems  Constitutional: Negative for chills and fever.  Neurological: Negative for tingling.    There were no vitals taken for this visit. Gen. appearance is normal grooming and hygiene normal Orientation to person place and time normal Mood normal  Right Shoulder Exam   Tenderness  The patient is experiencing no tenderness.    Range of Motion  The patient has normal right shoulder ROM.  Muscle Strength  The patient has normal right shoulder strength.  Tests  Apprehension: negative Impingement: negative  Other  Erythema: absent Scars: absent Sensation: normal Pulse: present   Left Shoulder Exam   Tenderness  The patient is experiencing tenderness in the acromion.  Range of Motion  Active Abduction: normal  Passive Abduction: normal  Extension: normal  Forward Flexion: 150  External Rotation: normal   Muscle Strength  Abduction: 5/5  Internal Rotation: 5/5  External Rotation: 5/5  Supraspinatus: 5/5  Subscapularis: 5/5  Biceps: 5/5   Tests  Apprehension: positive Cross Arm: negative Drop Arm: negative Impingement: positive  Other  Erythema: absent Scars: absent Sensation: normal Pulse: present       A/P  Medical decision-making  Encounter Diagnosis  Name Primary?  . Bursitis of left shoulder Yes   Procedure note the subacromial injection shoulder left   Verbal consent was obtained to inject the  Left   Shoulder  Timeout was completed to confirm the injection site is a subacromial space of the  left  shoulder  Medication used Depo-Medrol 40 mg and lidocaine 1% 3 cc  Anesthesia was provided by ethyl chloride  The injection  was performed in the left  posterior subacromial space. After pinning the skin with alcohol and anesthetized the skin with ethyl chloride the subacromial space was injected using a 20-gauge needle. There were no complications  Sterile dressing was applied.   Take one prednisone tablet daily  Do that for 2 weeks  Back in 3 weeks Alyssa CanadaStanley Enora Trillo, MD 09/03/2016 3:45 PM

## 2016-09-03 NOTE — Patient Instructions (Signed)
Take prednisone one a day  You have received an injection of steroids into the joint. 15% of patients will have increased pain within the 24 hours postinjection.   This is transient and will go away.   We recommend that you use ice packs on the injection site for 20 minutes every 2 hours and extra strength Tylenol 2 tablets every 8 as needed until the pain resolves.  If you continue to have pain after taking the Tylenol and using the ice please call the office for further instructions.

## 2016-09-16 ENCOUNTER — Ambulatory Visit (INDEPENDENT_AMBULATORY_CARE_PROVIDER_SITE_OTHER): Payer: Self-pay | Admitting: Orthopedic Surgery

## 2016-09-16 ENCOUNTER — Encounter: Payer: Self-pay | Admitting: Orthopedic Surgery

## 2016-09-16 ENCOUNTER — Ambulatory Visit (INDEPENDENT_AMBULATORY_CARE_PROVIDER_SITE_OTHER): Payer: Medicare Other

## 2016-09-16 DIAGNOSIS — M84374D Stress fracture, right foot, subsequent encounter for fracture with routine healing: Secondary | ICD-10-CM

## 2016-09-16 NOTE — Progress Notes (Signed)
Patient ID: Alyssa Poole, female   DOB: [birth year], 58 y.o.   MRN: [id number]  Chief Complaint  Patient presents with  . Follow-up    RIGHT FORTH METATARSAL FRACTURE    56 year old female hand fracture of the fourth metatarsal no known trauma. We've been treating her since May 7 so she is now 6 weeks post hard sole shoe. Here for x-ray. She says that he foot is still bothering her somewhat   Code Description Service Date Service Provider Modifiers Rob BuntingQty [phone] PR CLOSED RX METATARSAL FX 08/05/2016 Vickki HearingHarrison, Marria Mathison E, MD 1    ROS    There were no vitals taken for this visit. Gen. appearance is normal grooming and hygiene normal Orientation to person place and time normal Mood normal   Ortho Exam The fracture site is tender the toes are aligned normally is normal neurovascular deficit she walks with a slight limp. She walks with a slight limp with the postop shoe  X-ray was obtained callus is seen in the proximal fourth metatarsal fracture line still visible no displacement is seen  A/P  Medical decision-making  Encounter Diagnosis  Name Primary?  . Stress fracture of metatarsal bone of right foot with routine healing, subsequent encounter Yes   X-ray again in 4 weeks continue postop shoe for symptomatic relief   Fuller CanadaStanley Yarisbel Miranda, MD 09/16/2016 3:15 PM

## 2016-09-22 ENCOUNTER — Other Ambulatory Visit: Payer: Self-pay | Admitting: Orthopedic Surgery

## 2016-09-24 ENCOUNTER — Encounter: Payer: Self-pay | Admitting: Orthopedic Surgery

## 2016-09-24 ENCOUNTER — Ambulatory Visit (INDEPENDENT_AMBULATORY_CARE_PROVIDER_SITE_OTHER): Payer: Medicare Other | Admitting: Orthopedic Surgery

## 2016-09-24 DIAGNOSIS — M75102 Unspecified rotator cuff tear or rupture of left shoulder, not specified as traumatic: Secondary | ICD-10-CM | POA: Diagnosis not present

## 2016-09-24 MED ORDER — ALPRAZOLAM 0.5 MG PO TABS: ORAL_TABLET | ORAL | 0 refills | Status: DC

## 2016-09-24 NOTE — Progress Notes (Signed)
This is a follow-up visit  The patient has persistent pain left shoulder status post injection and several weeks of treatment with prednisone which seems to help some of the pain. There was no specific trauma she was treated for cervical disc disease she persists with weakness and pain with forward elevation pain at night trouble sleeping on her left side  Review of Systems  Musculoskeletal:       Pain right foot ongoing treatment for fracture    Exam shows weakness of the supraspinatus Tenderness of the anterolateral deltoid Decreased flexion and internal rotation normal abduction and external rotation Skin normal Neurovascular exam intact She is awake and alert oriented 3 mood pleasant   X-ray did not show any arthritis of the shoulder  Recommend MRI left shoulder for rotator cuff tear and surgical planning, failure of nonoperative care greater than 6 weeks

## 2016-09-30 ENCOUNTER — Ambulatory Visit (HOSPITAL_COMMUNITY)
Admission: RE | Admit: 2016-09-30 | Discharge: 2016-09-30 | Disposition: A | Discharge status: Home / Self Care | Payer: Medicare Other | Source: Ambulatory Visit | Attending: Orthopedic Surgery | Admitting: Orthopedic Surgery

## 2016-09-30 DIAGNOSIS — M67412 Ganglion, left shoulder: Secondary | ICD-10-CM | POA: Diagnosis not present

## 2016-09-30 DIAGNOSIS — M19012 Primary osteoarthritis, left shoulder: Secondary | ICD-10-CM | POA: Diagnosis not present

## 2016-09-30 DIAGNOSIS — M75102 Unspecified rotator cuff tear or rupture of left shoulder, not specified as traumatic: Secondary | ICD-10-CM | POA: Insufficient documentation

## 2016-10-04 ENCOUNTER — Ambulatory Visit (INDEPENDENT_AMBULATORY_CARE_PROVIDER_SITE_OTHER): Payer: Medicare Other | Admitting: Orthopedic Surgery

## 2016-10-04 ENCOUNTER — Encounter: Payer: Self-pay | Admitting: Orthopedic Surgery

## 2016-10-04 DIAGNOSIS — M75102 Unspecified rotator cuff tear or rupture of left shoulder, not specified as traumatic: Secondary | ICD-10-CM

## 2016-10-04 NOTE — Progress Notes (Signed)
This is a follow-up visit  Chief Complaint  Patient presents with  . Follow-up    MRI REVIEW LEFT SHOULDER    56 year old female with right shoulder pain treated conservatively with injections and oral anti-inflammatory medications and physical therapy  She has not improved  She went for an MRI of the left shoulder  System review she has various joint aches and pains,  MRI on my interpretation shows a partial tear of the rotator cuff and acromial clavicular arthritis with impinging inferior osteophyte on the rotator cuff  The report was reviewed  IMPRESSION: 1. 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 2. Several subacromial ganglion cysts are noted adjacent to the infraspinatus tendon. 3. Moderate AC joint osteoarthritis with slight impingement on the myotendinous junction of the supraspinatus tendon. 4. 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     Electronically Signed   By: Tollie Ethavid  Kwon M.D.   On: 09/30/2016 23:26   I discussed this with her she still having pain she would like to have surgery on the shoulder  An arthroscopic distal clavicle excision and rotator cuff repair if needed partial tear left shoulder

## 2016-10-14 ENCOUNTER — Ambulatory Visit (INDEPENDENT_AMBULATORY_CARE_PROVIDER_SITE_OTHER): Payer: Medicare Other

## 2016-10-14 ENCOUNTER — Ambulatory Visit (INDEPENDENT_AMBULATORY_CARE_PROVIDER_SITE_OTHER): Payer: Self-pay | Admitting: Orthopedic Surgery

## 2016-10-14 DIAGNOSIS — M84374D Stress fracture, right foot, subsequent encounter for fracture with routine healing: Secondary | ICD-10-CM

## 2016-10-14 NOTE — Progress Notes (Signed)
Office follow-up visit  Chief Complaint  Patient presents with  . Follow-up    STRESSFRACTURE RT FOOT 4TH MET     She is 55 years old she has a stress fracture and metatarsal of her right foot she's been in a postop shoe for several weeks going on since May 7, 70 days or 10 weeks  She has osteoporosis, she still having pain over the fourth metatarsal had a lot of pain yesterday  X-ray shows the fracture is healing but slowly  She is tender at the fracture site  Recommend continue heart sole shoe follow-up 2 months repeat x-ray  Encounter Diagnosis  Name Primary?  . Stress fracture of metatarsal bone of right foot with routine healing, subsequent encounter Yes

## 2016-10-16 NOTE — Patient Instructions (Signed)
Alyssa Poole  10/16/2016     @PREFPERIOPPHARMACY @   Your procedure is scheduled on  10/23/2016 .  Report to [facility] at  840  A.M.  Call this number if you have problems the morning of surgery:  [phone]   Remember:  Do not eat food or drink liquids after midnight.  Take these medicines the morning of surgery with A SIP OF WATER  Zyrtec, celexa, neurontin, hydrocodone, losartan, protonix.   Do not wear jewelry, make-up or nail polish.  Do not wear lotions, powders, or perfumes, or deoderant.  Do not shave 48 hours prior to surgery.  Men may shave face and neck.  Do not bring valuables to the hospital.  [facility] is not responsible for any belongings or valuables.  Contacts, dentures or bridgework may not be worn into surgery.  Leave your suitcase in the car.  After surgery it may be brought to your room.  For patients admitted to the hospital, discharge time will be determined by your treatment team.  Patients discharged the day of surgery will not be allowed to drive home.   Name and phone number of your driver:   family Special instructions:  None  Please read over the following fact sheets that you were given. Anesthesia Post-op Instructions and Care and Recovery After Surgery       Surgery for Rotator Cuff Tear The rotator cuff is a group of muscles and connective tissues (tendons) that surround the shoulder joint and keep the upper arm bone (humerus) in the shoulder socket. A tendon is the place on a muscle where it attaches to a bone. Surgery may be done to repair a partial or complete tear in the rotator cuff that cannot be treated by nonsurgical methods. The exact procedure that you have depends on your injury. If you have a partial tear, you may have surgery to reattach a tendon to the humerus. If you have a complete tear, you may have surgery to sew the two sides of the tear back together. Surgery may be done through small  incisions using an operating telescope (arthroscope), through a larger (open) incision, or through a combination of both. Tell a health care provider about:  Any allergies you have.  All medicines you are taking, including vitamins, herbs, eye drops, creams, and over-the-counter medicines.  Any problems you or family members have had with anesthetic medicines.  Any blood disorders you have.  Any surgeries you have had.  Any medical conditions you have.  Whether you are pregnant or may be pregnant. What are the risks? Generally, this is a safe procedure. However, problems may occur, including:  Infection.  Bleeding.  Allergic reactions to medicines or materials used during the procedure.  Damage to nerves, blood vessels, or shoulder muscles.  Permanent loss of full shoulder movement (stiffness).  What happens before the procedure? Staying hydrated Follow instructions from your health care provider about hydration, which may include:  Up to 2 hours before the procedure - you may continue to drink clear liquids, such as water, clear fruit juice, black coffee, and plain tea.  Eating and drinking restrictions Follow instructions from your health care provider about eating and drinking, which may include:  8 hours before the procedure - stop eating heavy meals or foods such as meat, fried foods, or fatty foods.  6 hours before the procedure - stop eating light meals or  foods, such as toast or cereal.  6 hours before the procedure - stop drinking milk or drinks that contain milk.  2 hours before the procedure - stop drinking clear liquids.  Other instructions  Ask your health care provider about: ? Changing or stopping your regular medicines. This is especially important if you are taking diabetes medicines or blood thinners. ? Taking medicines such as aspirin and ibuprofen. These medicines can thin your blood. Do not take these medicines before your procedure if your  health care provider instructs you not to.  Plan to have someone take you home from the hospital or clinic.  If you will be going home right after the procedure, plan to have someone with you for 24 hours.  Ask your health care provider how your surgical site will be marked or identified.  You may be given antibiotic medicine to help prevent infection. What happens during the procedure?  To reduce your risk of infection: ? Your health care team will wash or sanitize their hands. ? Your skin will be washed with soap.  An IV tube will be inserted into one of your veins.  You will be given one or more of the following: ? A medicine to help you relax (sedative). ? A medicine to make you fall asleep (general anesthetic). ? A medicine that is injected into an area of your body to numb everything beyond the injection site (regional anesthetic).  Your surgeon will move your shoulder to observe your injury.  If you are having arthroscopic surgery: ? Small incisions will be made in the front and back of your shoulder. ? An arthroscope will be inserted through these incisions to examine the inside of your shoulder and plan the surgery.  If you are having open surgery, a wider incision will be made in your shoulder.  Some of the muscle covering your shoulder (deltoid) may be moved to expose your rotator cuff.  Bony growths that might interfere with healing will be removed.  Your rotator cuff will be trimmed around the area where it has torn away from your humerus.  If your rotator cuff is completely torn, the split ends will be sewn back together.  Anchoring inserts will be placed into your humerus in the area where the tendon has torn away from the bone.  The torn end of your rotator cuff will be re-attached (anchored) to your humerus using stitches and small screws.  Your incisions will be closed with sutures.  The incision in your skin will be covered with a bandage (dressing) and  medicine.  Your arm will be placed in a sling. The procedure may vary among health care providers and hospitals. What happens after the procedure?  Your blood pressure, heart rate, breathing rate, and blood oxygen level will be monitored often until the medicines you were given have worn off.  You may have some pain. Medicines will be available to help you.  Do not drive for 24 hours if you received a sedative, or until your health care provider approves. This information is not intended to replace advice given to you by your health care provider. Make sure you discuss any questions you have with your health care provider. Document Released: 04/01/2015 Document Revised: 08/24/2015 Document Reviewed: 04/01/2015 Elsevier Interactive Patient Education  2018 [employer].  Surgery for Rotator Cuff Tear, Care After Refer to this sheet in the next few weeks. These instructions provide you with information about caring for yourself after your procedure. Your health  care provider may also give you more specific instructions. Your treatment has been planned according to current medical practices, but problems sometimes occur. Call your health care provider if you have any problems or questions after your procedure. What can I expect after the procedure? After the procedure, it is common to have:  Swelling.  Pain.  Stiffness.  Tenderness.  Follow these instructions at home: If you have a sling:  Wear the sling as told by your health care provider. Remove it only as told by your health care provider.  Loosen the sling if your fingers tingle, become numb, or turn cold and blue.  Do not let your sling get wet if it is not waterproof.  Keep the sling clean. Bathing  Do not take baths, swim, or use a hot tub until your health care provider approves. Ask your health care provider if you can take showers. You may only be allowed to take sponge baths for bathing.  Keep your bandage  (dressing) dry until your health care provider says it can be removed. Incision care  Follow instructions from your health care provider about how to take care of your incision. Make sure you: ? Wash your hands with soap and water before you change your dressing. If soap and water are not available, use hand sanitizer. ? Change your dressing as told by your health care provider. ? Leave stitches (sutures), skin glue, or adhesive strips in place. These skin closures may need to stay in place for 2 weeks or longer. If adhesive strip edges start to loosen and curl up, you may trim the loose edges. Do not remove adhesive strips completely unless your health care provider tells you to do that.  Check your incision area every day for signs of infection. Check for: ? More redness, swelling, or pain. ? More fluid or blood. ? Warmth. ? Pus or a bad smell. Managing pain, stiffness, and swelling   If directed, put ice on your shoulder area. ? Put ice in a plastic bag. ? Place a towel between your skin and the bag. ? Leave the ice on for 20 minutes, 2-3 times a day.  Move your fingers often to avoid stiffness and to lessen swelling.  Raise (elevate) your upper body on pillows when you lie down and when you sleep. ? Do not sleep on the front of your body (abdomen). ? Do not sleep on the side that your surgery was performed on. Driving  Do not drive for 24 hours if you received a medicine to help you relax (sedative) during your procedure.  Do not drive or operate heavy machinery while taking prescription pain medicine.  Ask your health care provider when it is safe for you to drive. Activity  Do not use your arm to support your body weight until your health care provider approves.  Do not lift or hold anything with your arm until your health care provider approves.  Return to your normal activities as told by your health care provider. Ask your health care provider what activities are safe  for you.  Do exercises as told by your health care provider. General instructions   Do not use any tobacco products, such as cigarettes, chewing tobacco, or e-cigarettes. Tobacco can delay healing. If you need help quitting, ask your health care provider.  Take over-the-counter and prescription medicines only as told by your health care provider.  If you were prescribed an antibiotic medicine, take it as told by your health care  provider. Do not stop taking the antibiotic even if you start to feel better.  Keep all follow-up visits as told by your health care provider. This is important. Contact a health care provider if:  You have a fever.  You have more redness, swelling, or pain around your incision.  You have more fluid or blood coming from your incision.  Your incision feels warm to the touch.  You have pus or a bad smell coming from your incision.  You have pain that gets worse or does not get better with medicine. Get help right away if:  You have severe pain.  You lose feeling in your arm or hand.  Your hand or fingers turn very pale or blue. This information is not intended to replace advice given to you by your health care provider. Make sure you discuss any questions you have with your health care provider. Document Released: 03/18/2005 Document Revised: 11/22/2015 Document Reviewed: 04/01/2015 Elsevier Interactive Patient Education  2018 [employer]  Surgery for Shoulder Impingement Syndrome, Care After Refer to this sheet in the next few weeks. These instructions provide you with information about caring for yourself after your procedure. Your health care provider may also give you more specific instructions. Your treatment has been planned according to current medical practices, but problems sometimes occur. Call your health care provider if you have any problems or questions after your procedure. What can I expect after the procedure? After your procedure, it  is common to have pain and stiffness in your shoulder area. This may include your arm, chest, and back. Follow these instructions at home: If you have a sling:  Wear the sling as told by your health care provider. Remove it only as told by your health care provider.  Loosen the sling if your fingers tingle, become numb, or turn cold and blue.  Do not let your sling get wet if it is not waterproof.  Keep the sling clean. Bathing  Do not take baths, swim, or use a hot tub until your health care provider approves. Ask your health care provider if you can take showers. You may only be allowed to take sponge baths for bathing.  If you have a sling that is not waterproof, cover it with a watertight covering when you take a bath or a shower.  Keep your bandage (dressing) dry until your health care provider says it can be removed. Incision care   Check your incision area every day for signs of infection. Check for: ? More redness, swelling, or pain. ? More fluid or blood. ? Warmth. ? Pus or a bad smell.  Follow instructions from your health care provider about how to take care of your incision. Make sure you: ? Wash your hands with soap and water before you change your bandage (dressing). If soap and water are not available, use hand sanitizer. ? Change your dressing as told by your health care provider. ? Leave stitches (sutures), skin glue, or adhesive strips in place. These skin closures may need to be in place for 2 weeks or longer. If adhesive strip edges start to loosen and curl up, you may trim the loose edges. Do not remove adhesive strips completely unless your health care provider tells you to do that. Driving  Do not drive or operate heavy machinery while taking prescription pain medicine.  Do not drive for 24 hours if you received a sedative.  Ask your health care provider when it is safe to drive if  you have a sling on your arm. Managing pain, stiffness, and  swelling   Move your fingers often to avoid stiffness and to lessen swelling.  Keep your arm and shoulder in the position recommended by your health care provider.  If directed, put ice on your shoulder. ? Put ice in a plastic bag. ? Place a towel between your skin and the bag. ? Leave the ice on for 20 minutes, 2-3 times a day. Activity  Return to your normal activities as told by your health care provider. Ask your health care provider what activities are safe for you.  Do not lift anything that is heavier than 10 lb (4.5 kg) until your health care provider tells you that it is safe.  Do exercises and stretches as told by your health care provider. General instructions   Take over-the-counter and prescription medicines only as told by your health care provider.  Do not use any tobacco products, such as cigarettes, chewing tobacco, and e-cigarettes. If you need help quitting, ask your health care provider.  Keep all follow-up visits as told by your health care provider. This is important. Contact a health care provider if:  You have a fever.  You have pain that gets worse or does not get better with medicine.  You have more redness, swelling, or pain around your incision.  You have more fluid or blood coming from your incision.  Your incision feels warm to the touch.  You have pus or a bad smell coming from your incision.  You have muscle aches.  You are dizzy. Get help right away if:  You have severe pain in your shoulder, arm, or hand.  Your hand or arm becomes numb.  Your hand or arm feels unusually cold.  Your fingernails turn a dark color, such as blue or gray. This information is not intended to replace advice given to you by your health care provider. Make sure you discuss any questions you have with your health care provider. Document Released: 07/10/2015 Document Revised: 08/24/2015 Document Reviewed: 03/04/2015 Elsevier Interactive Patient Education   2018 [employer]  Shoulder Arthroscopy Shoulder arthroscopy is a surgical technique used to evaluate and treat injuries involving the shoulder joint. In this technique, small cuts (incisions) are made in your shoulder. A small, telescope-like instrument with a lighted camera on one end (arthroscope) and surgical instruments are inserted through these incisions into your shoulder joint. This allows your health care provider to look directly into the joint and repair any damage at the same time. Tell a health care provider about:  Any allergies you have.  All medicines you are taking, including vitamins, herbs, eye drops, creams, and over-the-counter medicines.  Any problems you or family members have had with anesthetic medicines.  Any blood disorders you have.  Any surgeries you have had.  Any medical conditions you have. What are the risks? Generally, this is a safe procedure. However, problems can occur and include:  Damage to nerves or blood vessels.  Excess bleeding.  Blood clots.  Infection.  What happens before the procedure?  Ask your health care provider about: ? Changing or stopping your regular medicines. This is especially important if you are taking diabetes medicines or blood thinners. ? Taking medicines such as aspirin and ibuprofen. These medicines can thin your blood. Do not take these medicines before your procedure if your health care provider asks you not to.  Do not eat or drink anything after midnight on the night before the  procedure or as directed by your health care provider.  You may have an exam or testing. What happens during the procedure?  You may be given a medicine to make you sleep (general anesthetic) or a medicine to numb the shoulder area (local anesthetic).  Several small incisions will be made in your shoulder. Saline fluid will be put in through one of the incisions to expand the joint space so your health care provider can see the area  more easily.  An arthroscope will be inserted into one of the incisions. The arthroscope sends an image to a TV screen so the health care provider can examine your shoulder joint.  During the procedure, your health care provider may find various problems.  Tools may be inserted through the other incisions to repair any injuries found. In some cases, the procedure may change to an open surgery if problems are found that cannot be repaired with arthroscopy.  The incisions will then be closed with stitches or tape. What happens after the procedure? You will be taken to a recovery area where your progress will be watched. This information is not intended to replace advice given to you by your health care provider. Make sure you discuss any questions you have with your health care provider. Document Released: 10/13/2013 Document Revised: 11/12/2015 Document Reviewed: 05/07/2013 Elsevier Interactive Patient Education  2017 [employer] Shoulder Arthroscopy, Care After Refer to this sheet in the next few weeks. These instructions provide you with information on caring for yourself after your procedure. Your health care provider may also give you more specific instructions. Your treatment has been planned according to current medical practices, but problems sometimes occur. Call your health care provider if you have any problems or questions after your procedure. What can I expect after the procedure? After your procedure, it is typical to have the following:  Pain at the site of the surgical cuts (incisions).  Stiffness in your shoulder. This should gradually decrease over time. Your health care provider may recommend physical therapy to help improve this.  Nausea, vomiting, or constipation. These symptoms can result from taking pain medicine after surgery.  Clear or red drainage from the incision sites. This is normal for a few days after surgery.  Fatigue.  Follow these instructions at  home:  Take medicines only as directed by your health care provider.  Use a sling as directed.  There are many different ways to close and cover an incision, including stitches, skin glue, and adhesive strips. Follow your health care provider's instructions on: ? Incision care. ? Bandage (dressing) changes and removal. ? Incision closure removal.  Apply ice to the injured area: ? Put ice in a plastic bag. ? Place a towel between your skin and the bag. ? Leave the ice on for 20 minutes, 2-3 times a day.  If physical therapy and exercises are prescribed by your health care provider, do them as directed.  Keep all follow-up visits as directed by your health care provider. This is important. Contact a health care provider if: You have a fever. Get help right away if:  You have drainage, redness, swelling, or increasing pain at the incision site.  You notice a bad smell coming from the incision site or dressing.  Your incision site breaks open after your stitches or tape has been removed. This information is not intended to replace advice given to you by your health care provider. Make sure you discuss any questions you have with your health  care provider. Document Released: 10/13/2013 Document Revised: 11/12/2015 Document Reviewed: 05/07/2013 Elsevier Interactive Patient Education  2017 [employer]  General Anesthesia, Adult General anesthesia is the use of medicines to make a person "go to sleep" (be unconscious) for a medical procedure. General anesthesia is often recommended when a procedure:  Is long.  Requires you to be still or in an unusual position.  Is major and can cause you to lose blood.  Is impossible to do without general anesthesia.  The medicines used for general anesthesia are called general anesthetics. In addition to making you sleep, the medicines:  Prevent pain.  Control your blood pressure.  Relax your muscles.  Tell a health care provider  about:  Any allergies you have.  All medicines you are taking, including vitamins, herbs, eye drops, creams, and over-the-counter medicines.  Any problems you or family members have had with anesthetic medicines.  Types of anesthetics you have had in the past.  Any bleeding disorders you have.  Any surgeries you have had.  Any medical conditions you have.  Any history of heart or lung conditions, such as heart failure, sleep apnea, or chronic obstructive pulmonary disease (COPD).  Whether you are pregnant or may be pregnant.  Whether you use tobacco, alcohol, marijuana, or street drugs.  Any history of [employer].  Any history of depression or anxiety. What are the risks? Generally, this is a safe procedure. However, problems may occur, including:  Allergic reaction to anesthetics.  Lung and heart problems.  Inhaling food or liquids from your stomach into your lungs (aspiration).  Injury to nerves.  Waking up during your procedure and being unable to move (rare).  Extreme agitation or a state of mental confusion (delirium) when you wake up from the anesthetic.  Air in the bloodstream, which can lead to stroke.  These problems are more likely to develop if you are having a major surgery or if you have an advanced medical condition. You can prevent some of these complications by answering all of your health care provider's questions thoroughly and by following all pre-procedure instructions. General anesthesia can cause side effects, including:  Nausea or vomiting  A sore throat from the breathing tube.  Feeling cold or shivery.  Feeling tired, washed out, or achy.  Sleepiness or drowsiness.  Confusion or agitation.  What happens before the procedure? Staying hydrated Follow instructions from your health care provider about hydration, which may include:  Up to 2 hours before the procedure - you may continue to drink clear liquids, such as water, clear  fruit juice, black coffee, and plain tea.  Eating and drinking restrictions Follow instructions from your health care provider about eating and drinking, which may include:  8 hours before the procedure - stop eating heavy meals or foods such as meat, fried foods, or fatty foods.  6 hours before the procedure - stop eating light meals or foods, such as toast or cereal.  6 hours before the procedure - stop drinking milk or drinks that contain milk.  2 hours before the procedure - stop drinking clear liquids.  Medicines  Ask your health care provider about: ? Changing or stopping your regular medicines. This is especially important if you are taking diabetes medicines or blood thinners. ? Taking medicines such as aspirin and ibuprofen. These medicines can thin your blood. Do not take these medicines before your procedure if your health care provider instructs you not to. ? Taking new dietary supplements or medicines. Do not  take these during the week before your procedure unless your health care provider approves them.  If you are told to take a medicine or to continue taking a medicine on the day of the procedure, take the medicine with sips of water. General instructions   Ask if you will be going home the same day, the following day, or after a longer hospital stay. ? Plan to have someone take you home. ? Plan to have someone stay with you for the first 24 hours after you leave the hospital or clinic.  For 3-6 weeks before the procedure, try not to use any tobacco products, such as cigarettes, chewing tobacco, and e-cigarettes.  You may brush your teeth on the morning of the procedure, but make sure to spit out the toothpaste. What happens during the procedure?  You will be given anesthetics through a mask and through an IV tube in one of your veins.  You may receive medicine to help you relax (sedative).  As soon as you are asleep, a breathing tube may be used to help you  breathe.  An anesthesia specialist will stay with you throughout the procedure. He or she will help keep you comfortable and safe by continuing to give you medicines and adjusting the amount of medicine that you get. He or she will also watch your blood pressure, pulse, and oxygen levels to make sure that the anesthetics do not cause any problems.  If a breathing tube was used to help you breathe, it will be removed before you wake up. The procedure may vary among health care providers and hospitals. What happens after the procedure?  You will wake up, often slowly, after the procedure is complete, usually in a recovery area.  Your blood pressure, heart rate, breathing rate, and blood oxygen level will be monitored until the medicines you were given have worn off.  You may be given medicine to help you calm down if you feel anxious or agitated.  If you will be going home the same day, your health care provider may check to make sure you can stand, drink, and urinate.  Your health care providers will treat your pain and side effects before you go home.  Do not drive for 24 hours if you received a sedative.  You may: ? Feel nauseous and vomit. ? Have a sore throat. ? Have mental slowness. ? Feel cold or shivery. ? Feel sleepy. ? Feel tired. ? Feel sore or achy, even in parts of your body where you did not have surgery. This information is not intended to replace advice given to you by your health care provider. Make sure you discuss any questions you have with your health care provider. Document Released: 06/25/2007 Document Revised: 08/29/2015 Document Reviewed: 03/02/2015 Elsevier Interactive Patient Education  2018 [employer]. General Anesthesia, Adult, Care After These instructions provide you with information about caring for yourself after your procedure. Your health care provider may also give you more specific instructions. Your treatment has been planned according to current  medical practices, but problems sometimes occur. Call your health care provider if you have any problems or questions after your procedure. What can I expect after the procedure? After the procedure, it is common to have:  Vomiting.  A sore throat.  Mental slowness.  It is common to feel:  Nauseous.  Cold or shivery.  Sleepy.  Tired.  Sore or achy, even in parts of your body where you did not have surgery.  Follow  these instructions at home: For at least 24 hours after the procedure:  Do not: ? Participate in activities where you could fall or become injured. ? Drive. ? Use heavy machinery. ? Drink alcohol. ? Take sleeping pills or medicines that cause drowsiness. ? Make important decisions or sign legal documents. ? Take care of children on your own.  Rest. Eating and drinking  If you vomit, drink water, juice, or soup when you can drink without vomiting.  Drink enough fluid to keep your urine clear or pale yellow.  Make sure you have little or no nausea before eating solid foods.  Follow the diet recommended by your health care provider. General instructions  Have a responsible adult stay with you until you are awake and alert.  Return to your normal activities as told by your health care provider. Ask your health care provider what activities are safe for you.  Take over-the-counter and prescription medicines only as told by your health care provider.  If you smoke, do not smoke without supervision.  Keep all follow-up visits as told by your health care provider. This is important. Contact a health care provider if:  You continue to have nausea or vomiting at home, and medicines are not helpful.  You cannot drink fluids or start eating again.  You cannot urinate after 8-12 hours.  You develop a skin rash.  You have fever.  You have increasing redness at the site of your procedure. Get help right away if:  You have difficulty breathing.  You  have chest pain.  You have unexpected bleeding.  You feel that you are having a life-threatening or urgent problem. This information is not intended to replace advice given to you by your health care provider. Make sure you discuss any questions you have with your health care provider. Document Released: 06/24/2000 Document Revised: 08/21/2015 Document Reviewed: 03/02/2015 Elsevier Interactive Patient Education  [employer]2018 [employer]

## 2016-10-18 ENCOUNTER — Encounter (HOSPITAL_COMMUNITY)
Admission: RE | Admit: 2016-10-18 | Discharge: 2016-10-18 | Disposition: A | Discharge status: Home / Self Care | Payer: Medicare Other | Source: Ambulatory Visit | Attending: Orthopedic Surgery | Admitting: Orthopedic Surgery

## 2016-10-18 ENCOUNTER — Telehealth: Payer: Self-pay | Admitting: Orthopedic Surgery

## 2016-10-18 ENCOUNTER — Encounter (HOSPITAL_COMMUNITY): Payer: Self-pay

## 2016-10-18 DIAGNOSIS — Z01812 Encounter for preprocedural laboratory examination: Secondary | ICD-10-CM | POA: Insufficient documentation

## 2016-10-18 DIAGNOSIS — Z01810 Encounter for preprocedural cardiovascular examination: Secondary | ICD-10-CM | POA: Diagnosis not present

## 2016-10-18 DIAGNOSIS — M84374A Stress fracture, right foot, initial encounter for fracture: Secondary | ICD-10-CM | POA: Diagnosis not present

## 2016-10-18 DIAGNOSIS — X58XXXA Exposure to other specified factors, initial encounter: Secondary | ICD-10-CM | POA: Insufficient documentation

## 2016-10-18 HISTORY — DX: Unspecified osteoarthritis, unspecified site: M19.90

## 2016-10-18 LAB — BASIC METABOLIC PANEL
ANION GAP: 11 (ref 5–15)
BUN: 16 mg/dL (ref 6–20)
CO2: 27 mmol/L (ref 22–32)
Calcium: 8.9 mg/dL (ref 8.9–10.3)
Chloride: 101 mmol/L (ref 101–111)
Creatinine, Ser: 0.89 mg/dL (ref 0.44–1.00)
Glucose, Bld: 80 mg/dL (ref 65–99)
POTASSIUM: 3.2 mmol/L — AB (ref 3.5–5.1)
SODIUM: 139 mmol/L (ref 135–145)

## 2016-10-18 LAB — CBC WITH DIFFERENTIAL/PLATELET
BASOS ABS: 0.1 10*3/uL (ref 0.0–0.1)
BASOS PCT: 1 %
EOS ABS: 0.2 10*3/uL (ref 0.0–0.7)
EOS PCT: 3 %
HCT: 32.5 % — ABNORMAL LOW (ref 36.0–46.0)
HEMOGLOBIN: 10.6 g/dL — AB (ref 12.0–15.0)
Lymphocytes Relative: 35 %
Lymphs Abs: 3.3 10*3/uL (ref 0.7–4.0)
MCH: 31.5 pg (ref 26.0–34.0)
MCHC: 32.6 g/dL (ref 30.0–36.0)
MCV: 96.4 fL (ref 78.0–100.0)
Monocytes Absolute: 1 10*3/uL (ref 0.1–1.0)
Monocytes Relative: 11 %
NEUTROS PCT: 50 %
Neutro Abs: 4.9 10*3/uL (ref 1.7–7.7)
PLATELETS: 401 10*3/uL — AB (ref 150–400)
RBC: 3.37 MIL/uL — ABNORMAL LOW (ref 3.87–5.11)
RDW: 13.6 % (ref 11.5–15.5)
WBC: 9.5 10*3/uL (ref 4.0–10.5)

## 2016-10-18 LAB — SURGICAL PCR SCREEN
MRSA, PCR: NEGATIVE
STAPHYLOCOCCUS AUREUS: POSITIVE — AB

## 2016-10-18 NOTE — Pre-Procedure Instructions (Signed)
Patient in for PAT. EKG shows Junctional rhythm with rate of 53. Last EKG done in 2015 showed SB. patient aw her cardiologist, Dr Armanda [place]achery [place] last October. Dr Jayme CloudGonzalez shown EKG and wants pateint to be evaluated by her cardiologist before she has her surgery. I called Dr Romeo AppleHarrison and told him of above. Patient is to call cardiologist today and was instructed to call Dr Mort SawyersHarrison's office once she is cleared by her cardiologist.

## 2016-10-18 NOTE — Telephone Encounter (Signed)
Ms. Alyssa Poole called this morning stated that she went for pre op.  She was told that she is going to have to postpone surgery until she gets clearance from her heart doctor.  She has an appointment with her heart doctor, Dr. [place]Zachary in [place] on July 30th.  She will have them send us a medical clearance so that we can reschedule the surgery.

## 2016-10-23 ENCOUNTER — Encounter (HOSPITAL_COMMUNITY): Admission: RE | Payer: Self-pay | Source: Ambulatory Visit

## 2016-10-23 ENCOUNTER — Ambulatory Visit (HOSPITAL_COMMUNITY): Admission: RE | Admit: 2016-10-23 | Payer: Medicare Other | Source: Ambulatory Visit | Admitting: Orthopedic Surgery

## 2016-10-23 SURGERY — SHOULDER ARTHROSCOPY WITH OPEN ROTATOR CUFF REPAIR AND DISTAL CLAVICLE ACROMINECTOMY
Anesthesia: Choice | Laterality: Left

## 2016-11-04 ENCOUNTER — Ambulatory Visit: Payer: Medicare Other | Admitting: Orthopedic Surgery

## 2016-11-05 NOTE — Telephone Encounter (Signed)
Patient called to follow up on whether any update or clearance has been received regarding re-scheduling her surgery. Patient Ph #'s [phone] and cell [phone]. Relayed to patient I will check with clinical staff. Front office staff has not seen a clearance letter through fax.  Relayed I will be glad to re-request from [facility] - ph# [phone] / fax # [phone] - please advise

## 2016-11-05 NOTE — Telephone Encounter (Signed)
Please advise patient we have clearance. Will August 16 work?

## 2016-11-06 NOTE — Telephone Encounter (Signed)
Had tried calling patient back 11/05/16 to relay, left voice message.  Did a follow up call this morning, 11/06/16.  Patient  said "yes" - please schedule her surgery for 11/14/16.

## 2016-11-12 ENCOUNTER — Other Ambulatory Visit: Payer: Self-pay | Admitting: *Deleted

## 2016-11-12 NOTE — Patient Instructions (Signed)
Alyssa Poole  11/12/2016     @PREFPERIOPPHARMACY @   Your procedure is scheduled on  11/14/2016   Report to [facility] at  930  A.M.  Call this number if you have problems the morning of surgery:  [phone]   Remember:  Do not eat food or drink liquids after midnight.  Take these medicines the morning of surgery with A SIP OF WATER  Zyrtec, celexa, neurontin, hydrocodone, losartan, protonix.   Do not wear jewelry, make-up or nail polish.  Do not wear lotions, powders, or perfumes, or deoderant.  Do not shave 48 hours prior to surgery.  Men may shave face and neck.  Do not bring valuables to the hospital.  [facility] is not responsible for any belongings or valuables.  Contacts, dentures or bridgework may not be worn into surgery.  Leave your suitcase in the car.  After surgery it may be brought to your room.  For patients admitted to the hospital, discharge time will be determined by your treatment team.  Patients discharged the day of surgery will not be allowed to drive home.   Name and phone number of your driver:   family Special instructions:  None  Please read over the following fact sheets that you were given. MRSA Information, Surgical Site Infection Prevention, Anesthesia Post-op Instructions and Care and Recovery After Surgery       Surgery for Rotator Cuff Tear The rotator cuff is a group of muscles and connective tissues (tendons) that surround the shoulder joint and keep the upper arm bone (humerus) in the shoulder socket. A tendon is the place on a muscle where it attaches to a bone. Surgery may be done to repair a partial or complete tear in the rotator cuff that cannot be treated by nonsurgical methods. The exact procedure that you have depends on your injury. If you have a partial tear, you may have surgery to reattach a tendon to the humerus. If you have a complete tear, you may have surgery to sew the two sides of the tear back  together. Surgery may be done through small incisions using an operating telescope (arthroscope), through a larger (open) incision, or through a combination of both. Tell a health care provider about:  Any allergies you have.  All medicines you are taking, including vitamins, herbs, eye drops, creams, and over-the-counter medicines.  Any problems you or family members have had with anesthetic medicines.  Any blood disorders you have.  Any surgeries you have had.  Any medical conditions you have.  Whether you are pregnant or may be pregnant. What are the risks? Generally, this is a safe procedure. However, problems may occur, including:  Infection.  Bleeding.  Allergic reactions to medicines or materials used during the procedure.  Damage to nerves, blood vessels, or shoulder muscles.  Permanent loss of full shoulder movement (stiffness).  What happens before the procedure? Staying hydrated Follow instructions from your health care provider about hydration, which may include:  Up to 2 hours before the procedure - you may continue to drink clear liquids, such as water, clear fruit juice, black coffee, and plain tea.  Eating and drinking restrictions Follow instructions from your health care provider about eating and drinking, which may include:  8 hours before the procedure - stop eating heavy meals or foods such as meat, fried foods, or fatty foods.  6 hours before the procedure - stop eating light meals or  foods, such as toast or cereal.  6 hours before the procedure - stop drinking milk or drinks that contain milk.  2 hours before the procedure - stop drinking clear liquids.  Other instructions  Ask your health care provider about: ? Changing or stopping your regular medicines. This is especially important if you are taking diabetes medicines or blood thinners. ? Taking medicines such as aspirin and ibuprofen. These medicines can thin your blood. Do not take these  medicines before your procedure if your health care provider instructs you not to.  Plan to have someone take you home from the hospital or clinic.  If you will be going home right after the procedure, plan to have someone with you for 24 hours.  Ask your health care provider how your surgical site will be marked or identified.  You may be given antibiotic medicine to help prevent infection. What happens during the procedure?  To reduce your risk of infection: ? Your health care team will wash or sanitize their hands. ? Your skin will be washed with soap.  An IV tube will be inserted into one of your veins.  You will be given one or more of the following: ? A medicine to help you relax (sedative). ? A medicine to make you fall asleep (general anesthetic). ? A medicine that is injected into an area of your body to numb everything beyond the injection site (regional anesthetic).  Your surgeon will move your shoulder to observe your injury.  If you are having arthroscopic surgery: ? Small incisions will be made in the front and back of your shoulder. ? An arthroscope will be inserted through these incisions to examine the inside of your shoulder and plan the surgery.  If you are having open surgery, a wider incision will be made in your shoulder.  Some of the muscle covering your shoulder (deltoid) may be moved to expose your rotator cuff.  Bony growths that might interfere with healing will be removed.  Your rotator cuff will be trimmed around the area where it has torn away from your humerus.  If your rotator cuff is completely torn, the split ends will be sewn back together.  Anchoring inserts will be placed into your humerus in the area where the tendon has torn away from the bone.  The torn end of your rotator cuff will be re-attached (anchored) to your humerus using stitches and small screws.  Your incisions will be closed with sutures.  The incision in your skin will  be covered with a bandage (dressing) and medicine.  Your arm will be placed in a sling. The procedure may vary among health care providers and hospitals. What happens after the procedure?  Your blood pressure, heart rate, breathing rate, and blood oxygen level will be monitored often until the medicines you were given have worn off.  You may have some pain. Medicines will be available to help you.  Do not drive for 24 hours if you received a sedative, or until your health care provider approves. This information is not intended to replace advice given to you by your health care provider. Make sure you discuss any questions you have with your health care provider. Document Released: 04/01/2015 Document Revised: 08/24/2015 Document Reviewed: 04/01/2015 Elsevier Interactive Patient Education  2018 [employer].  Surgery for Rotator Cuff Tear, Care After Refer to this sheet in the next few weeks. These instructions provide you with information about caring for yourself after your procedure. Your health  care provider may also give you more specific instructions. Your treatment has been planned according to current medical practices, but problems sometimes occur. Call your health care provider if you have any problems or questions after your procedure. What can I expect after the procedure? After the procedure, it is common to have:  Swelling.  Pain.  Stiffness.  Tenderness.  Follow these instructions at home: If you have a sling:  Wear the sling as told by your health care provider. Remove it only as told by your health care provider.  Loosen the sling if your fingers tingle, become numb, or turn cold and blue.  Do not let your sling get wet if it is not waterproof.  Keep the sling clean. Bathing  Do not take baths, swim, or use a hot tub until your health care provider approves. Ask your health care provider if you can take showers. You may only be allowed to take sponge baths  for bathing.  Keep your bandage (dressing) dry until your health care provider says it can be removed. Incision care  Follow instructions from your health care provider about how to take care of your incision. Make sure you: ? Wash your hands with soap and water before you change your dressing. If soap and water are not available, use hand sanitizer. ? Change your dressing as told by your health care provider. ? Leave stitches (sutures), skin glue, or adhesive strips in place. These skin closures may need to stay in place for 2 weeks or longer. If adhesive strip edges start to loosen and curl up, you may trim the loose edges. Do not remove adhesive strips completely unless your health care provider tells you to do that.  Check your incision area every day for signs of infection. Check for: ? More redness, swelling, or pain. ? More fluid or blood. ? Warmth. ? Pus or a bad smell. Managing pain, stiffness, and swelling   If directed, put ice on your shoulder area. ? Put ice in a plastic bag. ? Place a towel between your skin and the bag. ? Leave the ice on for 20 minutes, 2-3 times a day.  Move your fingers often to avoid stiffness and to lessen swelling.  Raise (elevate) your upper body on pillows when you lie down and when you sleep. ? Do not sleep on the front of your body (abdomen). ? Do not sleep on the side that your surgery was performed on. Driving  Do not drive for 24 hours if you received a medicine to help you relax (sedative) during your procedure.  Do not drive or operate heavy machinery while taking prescription pain medicine.  Ask your health care provider when it is safe for you to drive. Activity  Do not use your arm to support your body weight until your health care provider approves.  Do not lift or hold anything with your arm until your health care provider approves.  Return to your normal activities as told by your health care provider. Ask your health care  provider what activities are safe for you.  Do exercises as told by your health care provider. General instructions   Do not use any tobacco products, such as cigarettes, chewing tobacco, or e-cigarettes. Tobacco can delay healing. If you need help quitting, ask your health care provider.  Take over-the-counter and prescription medicines only as told by your health care provider.  If you were prescribed an antibiotic medicine, take it as told by your health care  provider. Do not stop taking the antibiotic even if you start to feel better.  Keep all follow-up visits as told by your health care provider. This is important. Contact a health care provider if:  You have a fever.  You have more redness, swelling, or pain around your incision.  You have more fluid or blood coming from your incision.  Your incision feels warm to the touch.  You have pus or a bad smell coming from your incision.  You have pain that gets worse or does not get better with medicine. Get help right away if:  You have severe pain.  You lose feeling in your arm or hand.  Your hand or fingers turn very pale or blue. This information is not intended to replace advice given to you by your health care provider. Make sure you discuss any questions you have with your health care provider. Document Released: 03/18/2005 Document Revised: 11/22/2015 Document Reviewed: 04/01/2015 Elsevier Interactive Patient Education  2018 [employer].  General Anesthesia, Adult General anesthesia is the use of medicines to make a person "go to sleep" (be unconscious) for a medical procedure. General anesthesia is often recommended when a procedure:  Is long.  Requires you to be still or in an unusual position.  Is major and can cause you to lose blood.  Is impossible to do without general anesthesia.  The medicines used for general anesthesia are called general anesthetics. In addition to making you sleep, the  medicines:  Prevent pain.  Control your blood pressure.  Relax your muscles.  Tell a health care provider about:  Any allergies you have.  All medicines you are taking, including vitamins, herbs, eye drops, creams, and over-the-counter medicines.  Any problems you or family members have had with anesthetic medicines.  Types of anesthetics you have had in the past.  Any bleeding disorders you have.  Any surgeries you have had.  Any medical conditions you have.  Any history of heart or lung conditions, such as heart failure, sleep apnea, or chronic obstructive pulmonary disease (COPD).  Whether you are pregnant or may be pregnant.  Whether you use tobacco, alcohol, marijuana, or street drugs.  Any history of Financial [employer].  Any history of depression or anxiety. What are the risks? Generally, this is a safe procedure. However, problems may occur, including:  Allergic reaction to anesthetics.  Lung and heart problems.  Inhaling food or liquids from your stomach into your lungs (aspiration).  Injury to nerves.  Waking up during your procedure and being unable to move (rare).  Extreme agitation or a state of mental confusion (delirium) when you wake up from the anesthetic.  Air in the bloodstream, which can lead to stroke.  These problems are more likely to develop if you are having a major surgery or if you have an advanced medical condition. You can prevent some of these complications by answering all of your health care provider's questions thoroughly and by following all pre-procedure instructions. General anesthesia can cause side effects, including:  Nausea or vomiting  A sore throat from the breathing tube.  Feeling cold or shivery.  Feeling tired, washed out, or achy.  Sleepiness or drowsiness.  Confusion or agitation.  What happens before the procedure? Staying hydrated Follow instructions from your health care provider about hydration, which  may include:  Up to 2 hours before the procedure - you may continue to drink clear liquids, such as water, clear fruit juice, black coffee, and plain tea.  Eating and drinking restrictions Follow instructions from your health care provider about eating and drinking, which may include:  8 hours before the procedure - stop eating heavy meals or foods such as meat, fried foods, or fatty foods.  6 hours before the procedure - stop eating light meals or foods, such as toast or cereal.  6 hours before the procedure - stop drinking milk or drinks that contain milk.  2 hours before the procedure - stop drinking clear liquids.  Medicines  Ask your health care provider about: ? Changing or stopping your regular medicines. This is especially important if you are taking diabetes medicines or blood thinners. ? Taking medicines such as aspirin and ibuprofen. These medicines can thin your blood. Do not take these medicines before your procedure if your health care provider instructs you not to. ? Taking new dietary supplements or medicines. Do not take these during the week before your procedure unless your health care provider approves them.  If you are told to take a medicine or to continue taking a medicine on the day of the procedure, take the medicine with sips of water. General instructions   Ask if you will be going home the same day, the following day, or after a longer hospital stay. ? Plan to have someone take you home. ? Plan to have someone stay with you for the first 24 hours after you leave the hospital or clinic.  For 3-6 weeks before the procedure, try not to use any tobacco products, such as cigarettes, chewing tobacco, and e-cigarettes.  You may brush your teeth on the morning of the procedure, but make sure to spit out the toothpaste. What happens during the procedure?  You will be given anesthetics through a mask and through an IV tube in one of your veins.  You may receive  medicine to help you relax (sedative).  As soon as you are asleep, a breathing tube may be used to help you breathe.  An anesthesia specialist will stay with you throughout the procedure. He or she will help keep you comfortable and safe by continuing to give you medicines and adjusting the amount of medicine that you get. He or she will also watch your blood pressure, pulse, and oxygen levels to make sure that the anesthetics do not cause any problems.  If a breathing tube was used to help you breathe, it will be removed before you wake up. The procedure may vary among health care providers and hospitals. What happens after the procedure?  You will wake up, often slowly, after the procedure is complete, usually in a recovery area.  Your blood pressure, heart rate, breathing rate, and blood oxygen level will be monitored until the medicines you were given have worn off.  You may be given medicine to help you calm down if you feel anxious or agitated.  If you will be going home the same day, your health care provider may check to make sure you can stand, drink, and urinate.  Your health care providers will treat your pain and side effects before you go home.  Do not drive for 24 hours if you received a sedative.  You may: ? Feel nauseous and vomit. ? Have a sore throat. ? Have mental slowness. ? Feel cold or shivery. ? Feel sleepy. ? Feel tired. ? Feel sore or achy, even in parts of your body where you did not have surgery. This information is not intended to replace advice given to you  by your health care provider. Make sure you discuss any questions you have with your health care provider. Document Released: 06/25/2007 Document Revised: 08/29/2015 Document Reviewed: 03/02/2015 Elsevier Interactive Patient Education  2018 [employer]. General Anesthesia, Adult, Care After These instructions provide you with information about caring for yourself after your procedure. Your health  care provider may also give you more specific instructions. Your treatment has been planned according to current medical practices, but problems sometimes occur. Call your health care provider if you have any problems or questions after your procedure. What can I expect after the procedure? After the procedure, it is common to have:  Vomiting.  A sore throat.  Mental slowness.  It is common to feel:  Nauseous.  Cold or shivery.  Sleepy.  Tired.  Sore or achy, even in parts of your body where you did not have surgery.  Follow these instructions at home: For at least 24 hours after the procedure:  Do not: ? Participate in activities where you could fall or become injured. ? Drive. ? Use heavy machinery. ? Drink alcohol. ? Take sleeping pills or medicines that cause drowsiness. ? Make important decisions or sign legal documents. ? Take care of children on your own.  Rest. Eating and drinking  If you vomit, drink water, juice, or soup when you can drink without vomiting.  Drink enough fluid to keep your urine clear or pale yellow.  Make sure you have little or no nausea before eating solid foods.  Follow the diet recommended by your health care provider. General instructions  Have a responsible adult stay with you until you are awake and alert.  Return to your normal activities as told by your health care provider. Ask your health care provider what activities are safe for you.  Take over-the-counter and prescription medicines only as told by your health care provider.  If you smoke, do not smoke without supervision.  Keep all follow-up visits as told by your health care provider. This is important. Contact a health care provider if:  You continue to have nausea or vomiting at home, and medicines are not helpful.  You cannot drink fluids or start eating again.  You cannot urinate after 8-12 hours.  You develop a skin rash.  You have fever.  You have  increasing redness at the site of your procedure. Get help right away if:  You have difficulty breathing.  You have chest pain.  You have unexpected bleeding.  You feel that you are having a life-threatening or urgent problem. This information is not intended to replace advice given to you by your health care provider. Make sure you discuss any questions you have with your health care provider. Document Released: 06/24/2000 Document Revised: 08/21/2015 Document Reviewed: 03/02/2015 Elsevier Interactive Patient Education  [employer].

## 2016-11-12 NOTE — Telephone Encounter (Signed)
Patient has called back again today, 11/12/16 - thought she was scheduled for this surgery date of 11/14/16, being that clearance was received. I have requested that patient answer her cell phone, which is preferred#.  Please call back to her at CELL# [phone]

## 2016-11-13 ENCOUNTER — Encounter (HOSPITAL_COMMUNITY): Payer: Self-pay

## 2016-11-13 ENCOUNTER — Encounter (HOSPITAL_COMMUNITY)
Admission: RE | Admit: 2016-11-13 | Discharge: 2016-11-13 | Disposition: A | Discharge status: Home / Self Care | Payer: Medicare Other | Source: Ambulatory Visit | Attending: Orthopedic Surgery | Admitting: Orthopedic Surgery

## 2016-11-13 ENCOUNTER — Other Ambulatory Visit: Payer: Self-pay | Admitting: *Deleted

## 2016-11-13 DIAGNOSIS — M810 Age-related osteoporosis without current pathological fracture: Secondary | ICD-10-CM | POA: Diagnosis not present

## 2016-11-13 DIAGNOSIS — K219 Gastro-esophageal reflux disease without esophagitis: Secondary | ICD-10-CM | POA: Diagnosis not present

## 2016-11-13 DIAGNOSIS — M659 Synovitis and tenosynovitis, unspecified: Secondary | ICD-10-CM | POA: Diagnosis not present

## 2016-11-13 DIAGNOSIS — F329 Major depressive disorder, single episode, unspecified: Secondary | ICD-10-CM | POA: Diagnosis not present

## 2016-11-13 DIAGNOSIS — F17210 Nicotine dependence, cigarettes, uncomplicated: Secondary | ICD-10-CM | POA: Diagnosis not present

## 2016-11-13 DIAGNOSIS — Z96652 Presence of left artificial knee joint: Secondary | ICD-10-CM | POA: Diagnosis not present

## 2016-11-13 DIAGNOSIS — Z79891 Long term (current) use of opiate analgesic: Secondary | ICD-10-CM | POA: Diagnosis not present

## 2016-11-13 DIAGNOSIS — M7552 Bursitis of left shoulder: Secondary | ICD-10-CM | POA: Diagnosis not present

## 2016-11-13 DIAGNOSIS — Z79899 Other long term (current) drug therapy: Secondary | ICD-10-CM | POA: Diagnosis not present

## 2016-11-13 DIAGNOSIS — Z7982 Long term (current) use of aspirin: Secondary | ICD-10-CM | POA: Diagnosis not present

## 2016-11-13 DIAGNOSIS — M75102 Unspecified rotator cuff tear or rupture of left shoulder, not specified as traumatic: Secondary | ICD-10-CM | POA: Diagnosis present

## 2016-11-13 DIAGNOSIS — I10 Essential (primary) hypertension: Secondary | ICD-10-CM | POA: Diagnosis not present

## 2016-11-13 DIAGNOSIS — F419 Anxiety disorder, unspecified: Secondary | ICD-10-CM | POA: Diagnosis not present

## 2016-11-13 DIAGNOSIS — M19012 Primary osteoarthritis, left shoulder: Secondary | ICD-10-CM | POA: Diagnosis not present

## 2016-11-13 LAB — CBC WITH DIFFERENTIAL/PLATELET
BASOS PCT: 0 %
Basophils Absolute: 0 10*3/uL (ref 0.0–0.1)
Eosinophils Absolute: 0.2 10*3/uL (ref 0.0–0.7)
Eosinophils Relative: 2 %
HEMATOCRIT: 32.5 % — AB (ref 36.0–46.0)
Hemoglobin: 10.5 g/dL — ABNORMAL LOW (ref 12.0–15.0)
LYMPHS PCT: 37 %
Lymphs Abs: 4 10*3/uL (ref 0.7–4.0)
MCH: 31.7 pg (ref 26.0–34.0)
MCHC: 32.3 g/dL (ref 30.0–36.0)
MCV: 98.2 fL (ref 78.0–100.0)
MONO ABS: 1 10*3/uL (ref 0.1–1.0)
MONOS PCT: 9 %
NEUTROS ABS: 5.6 10*3/uL (ref 1.7–7.7)
Neutrophils Relative %: 52 %
Platelets: 382 10*3/uL (ref 150–400)
RBC: 3.31 MIL/uL — ABNORMAL LOW (ref 3.87–5.11)
RDW: 13.3 % (ref 11.5–15.5)
WBC: 10.8 10*3/uL — ABNORMAL HIGH (ref 4.0–10.5)

## 2016-11-13 LAB — BASIC METABOLIC PANEL
Anion gap: 9 (ref 5–15)
BUN: 17 mg/dL (ref 6–20)
CHLORIDE: 102 mmol/L (ref 101–111)
CO2: 27 mmol/L (ref 22–32)
Calcium: 8.6 mg/dL — ABNORMAL LOW (ref 8.9–10.3)
Creatinine, Ser: 0.81 mg/dL (ref 0.44–1.00)
GFR calc Af Amer: >60 mL/min (ref >60)
GFR calc non Af Amer: >60 mL/min (ref >60)
Glucose, Bld: 87 mg/dL (ref 65–99)
Potassium: 3.3 mmol/L — ABNORMAL LOW (ref 3.5–5.1)
Sodium: 138 mmol/L (ref 135–145)

## 2016-11-13 LAB — SURGICAL PCR SCREEN
MRSA, PCR: NEGATIVE
Staphylococcus aureus: NEGATIVE

## 2016-11-13 MED ORDER — MUPIROCIN 2 % EX OINT
TOPICAL_OINTMENT | CUTANEOUS | Status: AC
  Filled 2016-11-13: qty 22

## 2016-11-13 MED ORDER — POTASSIUM CHLORIDE CRYS ER 20 MEQ PO TBCR: 40.0000 meq | EXTENDED_RELEASE_TABLET | Freq: Three times a day (TID) | ORAL | 0 refills | Status: DC

## 2016-11-13 NOTE — Progress Notes (Signed)
Dr Jayme CloudGonzalez aware of K 3.3 . No orders given

## 2016-11-13 NOTE — H&P (Signed)
Patient ID: Alyssa Poole, female   DOB: May 30, 1960, 56 y.o.   MRN: [id number]  Pain left shoulder  HPI Alyssa Poole is a 56 y.o. female.  HPI This is a 56 year old female who had a cervical fusion which was successful. She presented for treatment of left shoulder pain which was initially thought to be bursitis. She had several injections in the left shoulder did not improve and eventually had MRI which showed a partial articular surface tear of the supraspinatus with subacromial ganglion cyst acromioclavicular arthritis with impingement of the myotendinous junction of the supraspinatus and subchondral degenerative cystic changes of the glenoid with chondral thinning. She opted for surgical intervention versus continued nonoperative intervention  She has undergone appropriate medical clearance after initial surgery was canceled   Review of Systems Review of Systems  Constitutional: Negative.   HENT: Negative for sneezing, sore throat and trouble swallowing.   Eyes: Negative for photophobia and discharge.  Respiratory: Positive for cough.   Cardiovascular: Negative for chest pain.  Gastrointestinal: Negative for abdominal distention.  Endocrine: Negative for cold intolerance and polyphagia.  Genitourinary: Negative for difficulty urinating.  Musculoskeletal: Positive for arthralgias, back pain and joint swelling.  Skin: Negative.   Neurological: Negative.   Hematological: Negative.   Psychiatric/Behavioral: Negative.     Past Medical History:  Diagnosis Date  . Anxiety   . Arthritis   . Asthma   . Depression 2002  . GERD (gastroesophageal reflux disease)   . Hypertension   . Neuromuscular disorder (HCC)   . Osteoporosis   . Panic attacks 2002    Past Surgical History:  Procedure Laterality Date  . BACK SURGERY     x4  . CERVICAL SPINE SURGERY     plate in neck  . CHOLECYSTECTOMY    . KNEE ARTHROSCOPY WITH MEDIAL MENISECTOMY Left 03/09/2014   Procedure: LEFT KNEE  ARTHROSCOPY WITH PARTIAL MEDIAL MENISECTOMY;  Surgeon: Vickki Hearing, MD;  Location: [facility];  Service: Orthopedics;  Laterality: Left;  . TOTAL KNEE ARTHROPLASTY Left 09/14/2014   Procedure: LEFT TOTAL KNEE ARTHROPLASTY;  Surgeon: Vickki Hearing, MD;  Location: [facility];  Service: Orthopedics;  Laterality: Left;    The patient was quizzed about their family history and reported no history of bleeding problems or anesthesia problems in their family  Social History Social History  Substance Use Topics  . Smoking status: Current Some Day Smoker    Packs/day: 1.00    Years: 41.00  . Smokeless tobacco: Never Used  . Alcohol use No    Allergies  Allergen Reactions  . Bee Venom Swelling    Causes bad swelling. No epipen  needed    No current facility-administered medications for this encounter.    Current Outpatient Prescriptions  Medication Sig Dispense Refill  . alendronate (FOSAMAX) 70 MG tablet Take 70 mg by mouth every 7 (seven) days. Takes on Sunday. Take with a full glass of water on an empty stomach.    . ALPRAZolam (XANAX) 0.5 MG tablet ONE TABLET 30 MINUTES PRIOR TO MRI 1 tablet 0  . aspirin EC 81 MG tablet Take 81 mg by mouth daily.    . cetirizine (ZYRTEC) 10 MG tablet Take 10 mg by mouth daily.    . citalopram (CELEXA) 20 MG tablet Take 20 mg by mouth daily.  6  . [employer] CALCIUM 600+D 600-800 MG-UNIT TABS Take 1 tablet by mouth daily.  11  . gabapentin (NEURONTIN) 300 MG capsule Take 300 mg by mouth  daily as needed (for neuropathy).     Marland Kitchen. HYDROcodone-acetaminophen (NORCO) 10-325 MG tablet Take 1 tablet by mouth 4 (four) times daily.    Marland Kitchen. losartan-hydrochlorothiazide (HYZAAR) 50-12.5 MG per tablet Take 1 tablet by mouth daily.      . Multiple Vitamin (MULTIVITAMIN WITH MINERALS) TABS tablet Take 1 tablet by mouth daily.    . naproxen (NAPROSYN) 500 MG tablet Take 500 mg by mouth 2 (two) times daily with a meal.    . naproxen sodium (ANAPROX) 220 MG tablet Take 220 mg by  mouth 2 (two) times daily with a meal.    . pantoprazole (PROTONIX) 40 MG tablet Take 40 mg by mouth Daily.     Marland Kitchen. PARoxetine (PAXIL) 20 MG tablet Take 1 tablet by mouth daily.    . potassium chloride SA (K-DUR,KLOR-CON) 20 MEQ tablet Take 2 tablets (40 mEq total) by mouth 3 (three) times daily. 6 tablet 0  . predniSONE (DELTASONE) 10 MG tablet Take 1 tablet (10 mg total) by mouth daily with breakfast. 90 tablet 0  . topiramate (TOPAMAX) 100 MG tablet Take 100 mg by mouth at bedtime.  3       Physical Exam There were no vitals taken for this visit. Physical Exam The patient is well developed well nourished and well groomed.  Orientation to person place and time is normal  Mood is pleasant.  Ambulatory status She can walk with no assistive devices Cervical spine exam is as follows slight decreased range of motion flexion extension rotation but it is not significantly diminished  Ortho Exam Left shoulder  Examination: Inspection reveals tenderness around the peri-acromial region. The patient has decreased range of motion 120 active FLEXION 45 active EXT ROT  and grade 4/ 5 motor function of the rotator cuff. Stability in abduction external rotation is normal.   Neurovascular examination is intact and the lymph nodes in the axilla and supraclavicular regions are normal   The opposite shoulder  exhibits normal range of motion stability and strength neurovascular exam is intact, lymph nodes are negative and there is no swelling or tenderness   Data Reviewed IMAGING: MRI report and my findings are noted in the history of present illness  Assessment  Left rotator cuff tear PASTA TYPE  Acromioclavicular arthritis  Glenohumeral arthritis  Arthroscopy left shoulder followed by mini open rotator cuff repair plus or minus open versus arthroscopic distal clavicle removal   Fuller CanadaStanley Harrison, MD 11/13/2016 5:00 PM

## 2016-11-14 ENCOUNTER — Ambulatory Visit (HOSPITAL_COMMUNITY)
Admission: RE | Admit: 2016-11-14 | Discharge: 2016-11-14 | Disposition: A | Discharge status: Home / Self Care | Payer: Medicare Other | Source: Ambulatory Visit | Attending: Orthopedic Surgery | Admitting: Orthopedic Surgery

## 2016-11-14 ENCOUNTER — Ambulatory Visit (HOSPITAL_COMMUNITY): Payer: Medicare Other | Admitting: Anesthesiology

## 2016-11-14 ENCOUNTER — Encounter (HOSPITAL_COMMUNITY): Payer: Self-pay

## 2016-11-14 ENCOUNTER — Encounter (HOSPITAL_COMMUNITY)
Admission: RE | Disposition: A | Discharge status: Home / Self Care | Payer: Self-pay | Source: Ambulatory Visit | Attending: Orthopedic Surgery

## 2016-11-14 DIAGNOSIS — M7552 Bursitis of left shoulder: Secondary | ICD-10-CM | POA: Insufficient documentation

## 2016-11-14 DIAGNOSIS — F419 Anxiety disorder, unspecified: Secondary | ICD-10-CM | POA: Insufficient documentation

## 2016-11-14 DIAGNOSIS — Z96652 Presence of left artificial knee joint: Secondary | ICD-10-CM | POA: Insufficient documentation

## 2016-11-14 DIAGNOSIS — M659 Synovitis and tenosynovitis, unspecified: Secondary | ICD-10-CM | POA: Insufficient documentation

## 2016-11-14 DIAGNOSIS — M19012 Primary osteoarthritis, left shoulder: Secondary | ICD-10-CM

## 2016-11-14 DIAGNOSIS — F329 Major depressive disorder, single episode, unspecified: Secondary | ICD-10-CM | POA: Insufficient documentation

## 2016-11-14 DIAGNOSIS — M75102 Unspecified rotator cuff tear or rupture of left shoulder, not specified as traumatic: Secondary | ICD-10-CM

## 2016-11-14 DIAGNOSIS — F17210 Nicotine dependence, cigarettes, uncomplicated: Secondary | ICD-10-CM | POA: Insufficient documentation

## 2016-11-14 DIAGNOSIS — I10 Essential (primary) hypertension: Secondary | ICD-10-CM | POA: Insufficient documentation

## 2016-11-14 DIAGNOSIS — Z79899 Other long term (current) drug therapy: Secondary | ICD-10-CM | POA: Insufficient documentation

## 2016-11-14 DIAGNOSIS — K219 Gastro-esophageal reflux disease without esophagitis: Secondary | ICD-10-CM | POA: Insufficient documentation

## 2016-11-14 DIAGNOSIS — Z79891 Long term (current) use of opiate analgesic: Secondary | ICD-10-CM | POA: Insufficient documentation

## 2016-11-14 DIAGNOSIS — Z7982 Long term (current) use of aspirin: Secondary | ICD-10-CM | POA: Insufficient documentation

## 2016-11-14 DIAGNOSIS — M810 Age-related osteoporosis without current pathological fracture: Secondary | ICD-10-CM | POA: Insufficient documentation

## 2016-11-14 HISTORY — PX: SHOULDER ARTHROSCOPY: SHX128

## 2016-11-14 SURGERY — ARTHROSCOPY, SHOULDER
Anesthesia: General | Site: Shoulder | Laterality: Left

## 2016-11-14 MED ORDER — FENTANYL CITRATE (PF) 100 MCG/2ML IJ SOLN
INTRAMUSCULAR | Status: DC | PRN
  Administered 2016-11-14 (×6): 50 ug via INTRAVENOUS

## 2016-11-14 MED ORDER — HYDROMORPHONE HCL 1 MG/ML IJ SOLN: 0.2500 mg | INTRAMUSCULAR | Status: DC | PRN

## 2016-11-14 MED ORDER — ROCURONIUM BROMIDE 50 MG/5ML IV SOLN
INTRAVENOUS | Status: AC
  Filled 2016-11-14: qty 1

## 2016-11-14 MED ORDER — GLYCOPYRROLATE 0.2 MG/ML IJ SOLN
0.2000 mg | Freq: Once | INTRAMUSCULAR | Status: AC
  Administered 2016-11-14: 0.2 mg via INTRAVENOUS

## 2016-11-14 MED ORDER — OXYCODONE-ACETAMINOPHEN 5-325 MG PO TABS: 1.0000 | ORAL_TABLET | ORAL | 0 refills | Status: DC | PRN

## 2016-11-14 MED ORDER — ONDANSETRON HCL 4 MG/2ML IJ SOLN
4.0000 mg | Freq: Once | INTRAMUSCULAR | Status: AC
  Administered 2016-11-14: 4 mg via INTRAVENOUS
  Filled 2016-11-14: qty 2

## 2016-11-14 MED ORDER — FENTANYL CITRATE (PF) 250 MCG/5ML IJ SOLN
INTRAMUSCULAR | Status: AC
  Filled 2016-11-14: qty 5

## 2016-11-14 MED ORDER — BUPIVACAINE-EPINEPHRINE (PF) 0.5% -1:200000 IJ SOLN
INTRAMUSCULAR | Status: AC
  Filled 2016-11-14: qty 30

## 2016-11-14 MED ORDER — CEFAZOLIN SODIUM-DEXTROSE 2-4 GM/100ML-% IV SOLN
2.0000 g | INTRAVENOUS | Status: AC
  Administered 2016-11-14: 2 g via INTRAVENOUS
  Filled 2016-11-14: qty 100

## 2016-11-14 MED ORDER — DEXAMETHASONE SODIUM PHOSPHATE 4 MG/ML IJ SOLN
4.0000 mg | Freq: Once | INTRAMUSCULAR | Status: AC
  Administered 2016-11-14: 4 mg via INTRAVENOUS

## 2016-11-14 MED ORDER — PROPOFOL 10 MG/ML IV BOLUS
INTRAVENOUS | Status: DC | PRN
  Administered 2016-11-14: 160 mg via INTRAVENOUS
  Administered 2016-11-14 (×2): 50 mg via INTRAVENOUS

## 2016-11-14 MED ORDER — ROCURONIUM BROMIDE 100 MG/10ML IV SOLN
INTRAVENOUS | Status: DC | PRN
  Administered 2016-11-14: 30 mg via INTRAVENOUS
  Administered 2016-11-14: 10 mg via INTRAVENOUS

## 2016-11-14 MED ORDER — PROPOFOL 10 MG/ML IV BOLUS
INTRAVENOUS | Status: AC
  Filled 2016-11-14: qty 40

## 2016-11-14 MED ORDER — SODIUM CHLORIDE 0.9 % IJ SOLN
INTRAMUSCULAR | Status: AC
  Filled 2016-11-14: qty 10

## 2016-11-14 MED ORDER — OXYCODONE HCL 5 MG PO TABS
5.0000 mg | ORAL_TABLET | ORAL | Status: DC
  Administered 2016-11-14: 5 mg via ORAL
  Filled 2016-11-14: qty 1

## 2016-11-14 MED ORDER — PREGABALIN 50 MG PO CAPS
50.0000 mg | ORAL_CAPSULE | Freq: Once | ORAL | Status: AC
  Administered 2016-11-14: 50 mg via ORAL
  Filled 2016-11-14: qty 1

## 2016-11-14 MED ORDER — GLYCOPYRROLATE 0.2 MG/ML IJ SOLN
INTRAMUSCULAR | Status: AC
  Filled 2016-11-14: qty 1

## 2016-11-14 MED ORDER — LACTATED RINGERS IV SOLN
INTRAVENOUS | Status: DC
  Administered 2016-11-14 (×2): via INTRAVENOUS

## 2016-11-14 MED ORDER — FENTANYL CITRATE (PF) 100 MCG/2ML IJ SOLN
INTRAMUSCULAR | Status: AC
  Filled 2016-11-14: qty 2

## 2016-11-14 MED ORDER — IPRATROPIUM-ALBUTEROL 0.5-2.5 (3) MG/3ML IN SOLN
3.0000 mL | Freq: Four times a day (QID) | RESPIRATORY_TRACT | Status: DC
  Administered 2016-11-14: 3 mL via RESPIRATORY_TRACT

## 2016-11-14 MED ORDER — MIDAZOLAM HCL 2 MG/2ML IJ SOLN
1.0000 mg | INTRAMUSCULAR | Status: AC
  Administered 2016-11-14: 2 mg via INTRAVENOUS

## 2016-11-14 MED ORDER — SUGAMMADEX SODIUM 500 MG/5ML IV SOLN
INTRAVENOUS | Status: DC | PRN
  Administered 2016-11-14: 119.8 mg via INTRAVENOUS

## 2016-11-14 MED ORDER — EPHEDRINE SULFATE 50 MG/ML IJ SOLN
INTRAMUSCULAR | Status: DC | PRN
  Administered 2016-11-14: 20 mg via INTRAVENOUS

## 2016-11-14 MED ORDER — LIDOCAINE HCL (PF) 1 % IJ SOLN
INTRAMUSCULAR | Status: AC
  Filled 2016-11-14: qty 5

## 2016-11-14 MED ORDER — IPRATROPIUM-ALBUTEROL 0.5-2.5 (3) MG/3ML IN SOLN
RESPIRATORY_TRACT | Status: AC
  Filled 2016-11-14: qty 3

## 2016-11-14 MED ORDER — KETOROLAC TROMETHAMINE 30 MG/ML IJ SOLN
30.0000 mg | Freq: Once | INTRAMUSCULAR | Status: AC
  Administered 2016-11-14: 30 mg via INTRAVENOUS
  Filled 2016-11-14: qty 1

## 2016-11-14 MED ORDER — SEVOFLURANE IN SOLN
RESPIRATORY_TRACT | Status: AC
  Filled 2016-11-14: qty 250

## 2016-11-14 MED ORDER — SODIUM CHLORIDE 0.9 % IR SOLN
Status: DC | PRN
  Administered 2016-11-14: 5 mL

## 2016-11-14 MED ORDER — ONDANSETRON HCL 4 MG/2ML IJ SOLN
INTRAMUSCULAR | Status: AC
  Filled 2016-11-14: qty 2

## 2016-11-14 MED ORDER — CHLORHEXIDINE GLUCONATE 4 % EX LIQD: 60.0000 mL | Freq: Once | CUTANEOUS | Status: DC

## 2016-11-14 MED ORDER — BUPIVACAINE-EPINEPHRINE 0.5% -1:200000 IJ SOLN
INTRAMUSCULAR | Status: DC | PRN
  Administered 2016-11-14: 60 mL

## 2016-11-14 MED ORDER — DEXAMETHASONE SODIUM PHOSPHATE 4 MG/ML IJ SOLN
INTRAMUSCULAR | Status: AC
  Filled 2016-11-14: qty 1

## 2016-11-14 MED ORDER — 0.9 % SODIUM CHLORIDE (POUR BTL) OPTIME
TOPICAL | Status: DC | PRN
  Administered 2016-11-14: 1000 mL

## 2016-11-14 MED ORDER — PHENYLEPHRINE 40 MCG/ML (10ML) SYRINGE FOR IV PUSH (FOR BLOOD PRESSURE SUPPORT)
PREFILLED_SYRINGE | INTRAVENOUS | Status: AC
  Filled 2016-11-14: qty 10

## 2016-11-14 MED ORDER — ONDANSETRON HCL 4 MG/2ML IJ SOLN
4.0000 mg | Freq: Once | INTRAMUSCULAR | Status: AC
  Administered 2016-11-14: 4 mg via INTRAVENOUS

## 2016-11-14 MED ORDER — SUCCINYLCHOLINE CHLORIDE 20 MG/ML IJ SOLN
INTRAMUSCULAR | Status: DC | PRN
  Administered 2016-11-14: 120 mg via INTRAVENOUS

## 2016-11-14 MED ORDER — EPHEDRINE SULFATE 50 MG/ML IJ SOLN
INTRAMUSCULAR | Status: AC
  Filled 2016-11-14: qty 1

## 2016-11-14 MED ORDER — MIDAZOLAM HCL 2 MG/2ML IJ SOLN
INTRAMUSCULAR | Status: AC
Start: 2016-11-14 — End: ?
  Filled 2016-11-14: qty 2

## 2016-11-14 SURGICAL SUPPLY — 98 items
BAG HAMPER (MISCELLANEOUS) ×4 IMPLANT
BIT DRILL 2.0X128 (BIT) ×3 IMPLANT
BIT DRILL 2.0X128MM (BIT) ×1
BLADE 10 SAFETY STRL DISP (BLADE) ×8 IMPLANT
BLADE 11 SAFETY STRL DISP (BLADE) ×4 IMPLANT
BLADE 15 SAFETY STRL DISP (BLADE) ×4 IMPLANT
BLADE AGGRESSIVE PLUS 4.0 (BLADE) ×4 IMPLANT
BLADE AVERAGE 25MMX9MM (BLADE)
BLADE AVERAGE 25X9 (BLADE) IMPLANT
BLADE HEX COATED 2.75 (ELECTRODE) ×4 IMPLANT
BNDG COHESIVE 4X5 TAN STRL (GAUZE/BANDAGES/DRESSINGS) ×4 IMPLANT
BUR 5.0 BARRELL (BURR) IMPLANT
BUR BARRELL 4.0 (BURR) ×4 IMPLANT
BUR ROUND 5.0 (BURR) IMPLANT
CANNULA DRILOCK 5.0MMX75MM (CANNULA)
CANNULA DRILOCK 5.0X75 (CANNULA) IMPLANT
CANNULA DRILOCK 6.5MMX75MM (CANNULA)
CANNULA DRILOCK 6.5X75 (CANNULA) IMPLANT
CANNULA DRILOCK 8.0MMX75MM (CANNULA)
CANNULA DRILOCK 8.0X75 (CANNULA) IMPLANT
CATH KIT ON Q 2.5IN SLV (PAIN MANAGEMENT) IMPLANT
CHLORAPREP W/TINT 26ML (MISCELLANEOUS) ×4 IMPLANT
CLOSURE WOUND 1/2 X4 (GAUZE/BANDAGES/DRESSINGS)
CLOTH BEACON ORANGE TIMEOUT ST (SAFETY) ×4 IMPLANT
COOLER CRYO CUFF IC AND MOTOR (MISCELLANEOUS) ×4 IMPLANT
COVER LIGHT HANDLE STERIS (MISCELLANEOUS) ×8 IMPLANT
COVER PROBE W GEL 5X96 (DRAPES) ×4 IMPLANT
CUFF CRYO UNI SHDR 32X48 (MISCELLANEOUS) ×4 IMPLANT
DECANTER SPIKE VIAL GLASS SM (MISCELLANEOUS) ×4 IMPLANT
DRAPE PROXIMA HALF (DRAPES) ×4 IMPLANT
DRAPE SHOULDER BEACH CHAIR (DRAPES) ×4 IMPLANT
DRAPE U-SHAPE 47X51 STRL (DRAPES) ×4 IMPLANT
DRESSING ALLEVYN BORDER 5X5 (GAUZE/BANDAGES/DRESSINGS) ×4 IMPLANT
DRESSING ALLEVYN BORDER HEEL (GAUZE/BANDAGES/DRESSINGS) ×8 IMPLANT
ELECT REM PT RETURN 9FT ADLT (ELECTROSURGICAL) ×4
ELECTRODE REM PT RTRN 9FT ADLT (ELECTROSURGICAL) ×2 IMPLANT
FACESHIELD STD STERILE (MASK) ×4 IMPLANT
FIBERSTICK 2 (SUTURE) IMPLANT
FLOOR PAD 36X40 (MISCELLANEOUS) ×4
GAUZE SPONGE 4X4 12PLY STRL (GAUZE/BANDAGES/DRESSINGS) IMPLANT
GAUZE SPONGE 4X4 16PLY XRAY LF (GAUZE/BANDAGES/DRESSINGS) ×4 IMPLANT
GAUZE XEROFORM 5X9 LF (GAUZE/BANDAGES/DRESSINGS) IMPLANT
GLOVE BIOGEL PI IND STRL 7.0 (GLOVE) ×2 IMPLANT
GLOVE BIOGEL PI INDICATOR 7.0 (GLOVE) ×2
GLOVE SKINSENSE NS SZ8.0 LF (GLOVE) ×2
GLOVE SKINSENSE STRL SZ8.0 LF (GLOVE) ×2 IMPLANT
GLOVE SS N UNI LF 8.5 STRL (GLOVE) ×4 IMPLANT
GOWN STRL REUS W/TWL LRG LVL3 (GOWN DISPOSABLE) ×16 IMPLANT
GOWN STRL REUS W/TWL XL LVL3 (GOWN DISPOSABLE) ×4 IMPLANT
IMMOBILIZER SHOULDER LGE (ORTHOPEDIC SUPPLIES) IMPLANT
IMMOBILIZER SHOULDER MED (ORTHOPEDIC SUPPLIES) ×4 IMPLANT
IMMOBILIZER SHOULDER XLGE (ORTHOPEDIC SUPPLIES) IMPLANT
INST SET MINOR BONE (KITS) ×4 IMPLANT
IV NS IRRIG 3000ML ARTHROMATIC (IV SOLUTION) ×8 IMPLANT
KIT BLADEGUARD II DBL (SET/KITS/TRAYS/PACK) ×4 IMPLANT
KIT POSITION SHOULDER SCHLEI (MISCELLANEOUS) ×4 IMPLANT
KIT ROOM TURNOVER APOR (KITS) ×4 IMPLANT
MANIFOLD NEPTUNE II (INSTRUMENTS) ×4 IMPLANT
MARKER SKIN DUAL TIP RULER LAB (MISCELLANEOUS) ×4 IMPLANT
NEEDLE HYPO 21X1.5 SAFETY (NEEDLE) ×4 IMPLANT
NEEDLE MAYO 6 CRC TAPER PT (NEEDLE) ×4 IMPLANT
NEEDLE SCORPION (NEEDLE) IMPLANT
NEEDLE SPNL 18GX3.5 QUINCKE PK (NEEDLE) ×4 IMPLANT
NS IRRIG 1000ML POUR BTL (IV SOLUTION) ×4 IMPLANT
PACK BASIC III (CUSTOM PROCEDURE TRAY) ×2
PACK SRG BSC III STRL LF ECLPS (CUSTOM PROCEDURE TRAY) ×2 IMPLANT
PAD ABD 5X9 TENDERSORB (GAUZE/BANDAGES/DRESSINGS) IMPLANT
PAD ARMBOARD 7.5X6 YLW CONV (MISCELLANEOUS) ×4 IMPLANT
PAD FLOOR 36X40 (MISCELLANEOUS) ×2 IMPLANT
PASSER SUT CAPTURE FIRST (SUTURE) IMPLANT
PENCIL HANDSWITCHING (ELECTRODE) ×4 IMPLANT
RASP SM TEAR CROSS CUT (RASP) IMPLANT
SET ARTHROSCOPY INST (INSTRUMENTS) ×4 IMPLANT
SET BASIN LINEN APH (SET/KITS/TRAYS/PACK) ×4 IMPLANT
STOCKINETTE IMPERVIOUS LG (DRAPES) ×4 IMPLANT
STRIP CLOSURE SKIN 1/2X4 (GAUZE/BANDAGES/DRESSINGS) IMPLANT
SUT BONE WAX W31G (SUTURE) IMPLANT
SUT ETHIBOND NAB OS 4 #2 30IN (SUTURE) IMPLANT
SUT FIBERWIRE #2 38 REV NDL BL (SUTURE)
SUT FIBERWIRE #2 38 T-5 BLUE (SUTURE)
SUT LASSO 45 DEGREE (SUTURE) IMPLANT
SUT LASSO 45 DEGREE LEFT (SUTURE) IMPLANT
SUT LASSO 45D RIGHT (SUTURE) IMPLANT
SUT MON AB 2-0 CT1 36 (SUTURE) IMPLANT
SUT PROLENE 2 0 FS (SUTURE) IMPLANT
SUT PROLENE 2 0 SH 30 (SUTURE) IMPLANT
SUT PROLENE 3 0 PS 1 (SUTURE) IMPLANT
SUT VIC AB 1 CT1 27 (SUTURE)
SUT VIC AB 1 CT1 27XBRD ANTBC (SUTURE) IMPLANT
SUTURE FIBERWR #2 38 T-5 BLUE (SUTURE) IMPLANT
SUTURE FIBERWR#2 38 REV NDL BL (SUTURE) IMPLANT
SYR 30ML LL (SYRINGE) IMPLANT
SYR BULB IRRIGATION 50ML (SYRINGE) IMPLANT
TAPE MEDIFIX FOAM 3 (GAUZE/BANDAGES/DRESSINGS) IMPLANT
TOWEL OR 17X26 4PK STRL BLUE (TOWEL DISPOSABLE) ×4 IMPLANT
TUBING ARTHROSCOPY INFLOW/OUT (IRRIGATION / IRRIGATOR) ×4 IMPLANT
WAND 90 DEG TURBOVAC W/CORD (SURGICAL WAND) ×4 IMPLANT
YANKAUER SUCT BULB TIP 10FT TU (MISCELLANEOUS) ×8 IMPLANT

## 2016-11-14 NOTE — Brief Op Note (Signed)
11/14/2016  12:06 PM  PATIENT:  Alyssa Poole  56 y.o. female  PRE-OPERATIVE DIAGNOSIS:  left rotator cuff tear  POST-OPERATIVE DIAGNOSIS:  Bursitis left shoulder, rotator cuff tendinitis tendinosis, acromioclavicular joint arthrosis, glenohumeral joint arthrosis, glenohumeral synovitis  PROCEDURE:  Procedure(s): SHOULDER ARTHROSCOPY  DISTAL CLAVICLE excision with acromioplasty subacromial bursectomy and glenohumeral joint limited debridement  (Left)  Operative findings the glenohumeral joint was arthritic there was degenerative fraying of the anterior labrum with synovitis and degenerative fraying of the subscapularis is a small undersurface rotator cuff abrasion without detachment  The subacromial bursa was inflamed and thickened, the acromion had a medial spur in the distal clavicle had an inferior spur  The superior surface of the rotator cuff had evidence of tendinosis without tear  The surgery was done in the following manner  The patient was identified in the preop area using 2 approved identification parameters. The chart was reviewed and updated. The surgical site was confirmed and marked. The patient was taken to surgery and given 2 g of Ancef IV and then intubation was performed. The patient was placed in the supine position and then placed in the modified beachchair position on a Schlein positioner  After sterile prep and drape and timeout a posterior portal was established the scope was placed into the joint.  A diagnostic arthroscopy was performed and the findings are noted above. An anterior portal was placed using a spinal needle and a shaver was placed into the joint and a debridement was performed of the anterior labrum as well as the subscapularis and the synovitis that was noted throughout the joint the undersurface rotator cuff abrasion/partial tearing was debrided and it was less than 50% with of the tendon. The biceps tendon anchor was firmly attached and the  intra-articular portion of the biceps tendon was normal  The scope was then placed in subacromial space and a lateral portal was established to perform a bursectomy. The shoulder was taken through range of motion examining the superior aspect of the rotator cuff and there was no evidence of tearing just severe bursitis and tendinosis. We extended our diagnostic arthroscopy of the subacromial space to the acromioclavicular joint and after debridement and isolation of the joint the previous anterior portal was used to place a bur 4 mm in width into the subacromial acromioclavicular area where a acromioplasty was performed of the medial spur and in the lateral portion of the distal clavicle was removed removing 5 mm of bone in the inferior spurring  The joint and subacromial area was irrigated and injected with Marcaine with epinephrine and the 3 portals were closed. We used 3-0 nylon suture to close. A sterile dressing was applied and a sling was applied  The patient was extubated and taken to recovery room in stable condition   SURGEON :  Surgeon(s) and Role:    * Romeo Apple, Fernande Boyden, MD - Primary  PHYSICIAN ASSISTANT:   ASSISTANTS: betty ashley    ANESTHESIA:   general  EBL:  Total I/O In: 1000 [I.V.:1000] Out: 10 [Blood:10]  BLOOD ADMINISTERED:none  DRAINS: none   LOCAL MEDICATIONS USED:  MARCAINE     SPECIMEN:  No Specimen  DISPOSITION OF SPECIMEN:  N/A  COUNTS:  YES  TOURNIQUET:  * No tourniquets in log *  DICTATION: .Dragon Dictation  PLAN OF CARE: Discharge to home after PACU  PATIENT DISPOSITION:  PACU - hemodynamically stable.   Delay start of Pharmacological VTE agent (>24hrs) due to surgical blood loss or risk  of bleeding: yes  [phone] [id number]

## 2016-11-14 NOTE — Anesthesia Preprocedure Evaluation (Signed)
Anesthesia Evaluation  Patient identified by MRN, date of birth, ID band Patient awake    Reviewed: Allergy & Precautions, H&P , NPO status , Patient's Chart, lab work & pertinent test results  History of Anesthesia Complications Negative for: history of anesthetic complications  Airway Mallampati: II  TM Distance: >3 FB     Dental  (+) Partial Upper, Partial Lower, Poor Dentition, Dental Advisory Given   Pulmonary shortness of breath and with exertion, COPD (am cough), Current Smoker,   raspy voice  breath sounds clear to auscultation       Cardiovascular hypertension, Pt. on medications  Rhythm:Regular Rate:Normal     Neuro/Psych PSYCHIATRIC DISORDERS Anxiety Depression  Neuromuscular disease    GI/Hepatic GERD  Medicated,  Endo/Other    Renal/GU      Musculoskeletal  (+) Arthritis ,   Abdominal   Peds  Hematology   Anesthesia Other Findings   Reproductive/Obstetrics                             Anesthesia Physical Anesthesia Plan  ASA: III  Anesthesia Plan: General   Post-op Pain Management:    Induction: Intravenous, Rapid sequence and Cricoid pressure planned  PONV Risk Score and Plan:   Airway Management Planned: Oral ETT  Additional Equipment:   Intra-op Plan:   Post-operative Plan: Extubation in OR  Informed Consent: I have reviewed the patients History and Physical, chart, labs and discussed the procedure including the risks, benefits and alternatives for the proposed anesthesia with the patient or authorized representative who has indicated his/her understanding and acceptance.     Plan Discussed with:   Anesthesia Plan Comments:         Anesthesia Quick Evaluation

## 2016-11-14 NOTE — Anesthesia Procedure Notes (Addendum)
Procedure Name: Intubation Date/Time: 11/14/2016 10:36 AM Performed by: Pernell DupreADAMS, Christopher Hink A Pre-anesthesia Checklist: Patient identified, Patient being monitored, Timeout performed, Emergency Drugs available and Suction available Patient Re-evaluated:Patient Re-evaluated prior to induction Oxygen Delivery Method: Circle System Utilized Preoxygenation: Pre-oxygenation with 100% oxygen Induction Type: IV induction, Rapid sequence and Cricoid Pressure applied Laryngoscope Size: Miller and 3 Grade View: Grade II Tube type: Oral Tube size: 7.0 mm Number of attempts: 1 Airway Equipment and Method: Stylet Placement Confirmation: ETT inserted through vocal cords under direct vision,  positive ETCO2 and breath sounds checked- equal and bilateral Secured at: 21 cm Tube secured with: Tape Dental Injury: Teeth and Oropharynx as per pre-operative assessment

## 2016-11-14 NOTE — Interval H&P Note (Signed)
History and Physical Interval Note:  11/14/2016 10:15 AM  Alyssa RuskBobbie W Benton  has presented today for surgery, with the diagnosis of left rotator cuff tear  The various methods of treatment have been discussed with the patient and family. After consideration of risks, benefits and other options for treatment, the patient has consented to  Procedure(s): SHOULDER ARTHROSCOPY WITH OPEN ROTATOR CUFF REPAIR AND DISTAL CLAVICLE ACROMINECTOMY (Left) as a surgical intervention .  The patient's history has been reviewed, patient examined, no change in status, stable for surgery.  I have reviewed the patient's chart and labs.  Questions were answered to the patient's satisfaction.     [name]

## 2016-11-14 NOTE — Transfer of Care (Signed)
Immediate Anesthesia Transfer of Care Note  Patient: Alyssa Poole  Procedure(s) Performed: Procedure(s): ARTHROSCOPY SHOULDER WITH LIMITED DEDBRIDEMENT AND ACROMIOPLASTY (Left)  Patient Location: PACU  Anesthesia Type:General  Level of Consciousness: awake, oriented, drowsy and patient cooperative  Airway & Oxygen Therapy: Patient Spontanous Breathing and Patient connected to face mask oxygen  Post-op Assessment: Report given to RN and Post -op Vital signs reviewed and stable  Post vital signs: Reviewed and stable  Last Vitals:  Vitals:   11/14/16 1000 11/14/16 1005  BP: 111/68 (!) 109/59  Pulse:    Resp: 10 15  Temp:    SpO2: 100% 91%    Last Pain:  Vitals:   11/14/16 0947  TempSrc: Oral  PainSc: 6       Patients Stated Pain Goal: 5 (11/14/16 0947)  Complications: No apparent anesthesia complications

## 2016-11-14 NOTE — Op Note (Signed)
11/14/2016  12:06 PM  PATIENT:  Alyssa Poole  55 y.o. female  PRE-OPERATIVE DIAGNOSIS:  left rotator cuff tear  POST-OPERATIVE DIAGNOSIS:  Bursitis left shoulder, rotator cuff tendinitis tendinosis, acromioclavicular joint arthrosis, glenohumeral joint arthrosis, glenohumeral synovitis  PROCEDURE:  Procedure(s): SHOULDER ARTHROSCOPY  DISTAL CLAVICLE excision with acromioplasty subacromial bursectomy and glenohumeral joint limited debridement  (Left)  Operative findings the glenohumeral joint was arthritic there was degenerative fraying of the anterior labrum with synovitis and degenerative fraying of the subscapularis is a small undersurface rotator cuff abrasion without detachment  The subacromial bursa was inflamed and thickened, the acromion had a medial spur in the distal clavicle had an inferior spur  The superior surface of the rotator cuff had evidence of tendinosis without tear  The surgery was done in the following manner  The patient was identified in the preop area using 2 approved identification parameters. The chart was reviewed and updated. The surgical site was confirmed and marked. The patient was taken to surgery and given 2 g of Ancef IV and then intubation was performed. The patient was placed in the supine position and then placed in the modified beachchair position on a Schlein positioner  After sterile prep and drape and timeout a posterior portal was established the scope was placed into the joint.  A diagnostic arthroscopy was performed and the findings are noted above. An anterior portal was placed using a spinal needle and a shaver was placed into the joint and a debridement was performed of the anterior labrum as well as the subscapularis and the synovitis that was noted throughout the joint the undersurface rotator cuff abrasion/partial tearing was debrided and it was less than 50% with of the tendon. The biceps tendon anchor was firmly attached and the  intra-articular portion of the biceps tendon was normal  The scope was then placed in subacromial space and a lateral portal was established to perform a bursectomy. The shoulder was taken through range of motion examining the superior aspect of the rotator cuff and there was no evidence of tearing just severe bursitis and tendinosis. We extended our diagnostic arthroscopy of the subacromial space to the acromioclavicular joint and after debridement and isolation of the joint the previous anterior portal was used to place a bur 4 mm in width into the subacromial acromioclavicular area where a acromioplasty was performed of the medial spur and in the lateral portion of the distal clavicle was removed removing 5 mm of bone in the inferior spurring  The joint and subacromial area was irrigated and injected with Marcaine with epinephrine and the 3 portals were closed. We used 3-0 nylon suture to close. A sterile dressing was applied and a sling was applied  The patient was extubated and taken to recovery room in stable condition   SURGEON :  Surgeon(s) and Role:    * Atthew Coutant E, MD - Primary  PHYSICIAN ASSISTANT:   ASSISTANTS: betty ashley    ANESTHESIA:   general  EBL:  Total I/O In: 1000 [I.V.:1000] Out: 10 [Blood:10]  BLOOD ADMINISTERED:none  DRAINS: none   LOCAL MEDICATIONS USED:  MARCAINE     SPECIMEN:  No Specimen  DISPOSITION OF SPECIMEN:  N/A  COUNTS:  YES  TOURNIQUET:  * No tourniquets in log *  DICTATION: .Dragon Dictation  PLAN OF CARE: Discharge to home after PACU  PATIENT DISPOSITION:  PACU - hemodynamically stable.   Delay start of Pharmacological VTE agent (>24hrs) due to surgical blood loss or risk   of bleeding: yes  [id number]  

## 2016-11-14 NOTE — Anesthesia Postprocedure Evaluation (Signed)
Anesthesia Post Note  Patient: Durwin Nora Benton  Procedure(s) Performed: Procedure(s) (LRB): ARTHROSCOPY SHOULDER WITH LIMITED DEDBRIDEMENT AND ACROMIOPLASTY AND DISTAL CLAVICAL EXCISION (Left)  Patient location during evaluation: PACU Anesthesia Type: General and MAC Level of consciousness: awake and patient cooperative Pain management: pain level controlled Vital Signs Assessment: post-procedure vital signs reviewed and stable Respiratory status: spontaneous breathing and respiratory function stable Cardiovascular status: stable Postop Assessment: no signs of nausea or vomiting Anesthetic complications: no     Last Vitals:  Vitals:   11/14/16 1245 11/14/16 1300  BP: (!) 120/48 (!) 119/49  Pulse: 95 88  Resp: 17 17  Temp:    SpO2: 100% 100%    Last Pain:  Vitals:   11/14/16 1225  TempSrc:   PainSc: Asleep                 ADAMS, AMY A

## 2016-11-14 NOTE — Discharge Instructions (Signed)
Bursitis Bursitis is when the fluid-filled sac (bursa) that covers and protects a joint is swollen (inflamed). Bursitis is most common near joints, especially the knees, elbows, hips, and shoulders. Follow these instructions at home:  Take medicines only as told by your doctor.  If you were prescribed an antibiotic medicine, finish it all even if you start to feel better.  Rest the affected area as told by your doctor. ? Keep the area raised up. ? Avoid doing things that make the pain worse.  Apply ice to the injured area: ? Place ice in a plastic bag. ? Place a towel between your skin and the bag. ? Leave the ice on for 20 minutes, 2-3 times a day.  Use splints, braces, pads, or walking aids as told by your doctor.  Keep all follow-up visits as told by your doctor. This is important. Contact a doctor if:  You have more pain with home care.  You have a fever.  You have chills. This information is not intended to replace advice given to you by your health care provider. Make sure you discuss any questions you have with your health care provider. Document Released: 09/05/2009 Document Revised: 08/24/2015 Document Reviewed: 06/07/2013 Elsevier Interactive Patient Education  [employer]2018 [employer] Your surgery went very well. You had several problems in the shoulder which were addressed.  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 These instructions provide you with information on caring for yourself after your procedure. Your health care provider may also  give you more specific instructions. Your treatment has been planned according to current medical practices, but problems sometimes occur. Call your health care provider if you have any problems or questions after your procedure. What can I expect after the procedure? After your procedure, it is typical to have the following:  Pain at the site of the surgical cuts (incisions).  Stiffness in your shoulder. This should gradually decrease over time. Your health care provider may recommend physical therapy to help improve this.  Nausea, vomiting, or constipation. These symptoms can result from taking pain medicine after surgery.  Clear or red drainage from the incision sites. This is normal for a few days after surgery.  Fatigue.  Follow these instructions at home:  Take medicines only as directed by your health care provider.  Use a sling as directed.  There are many different ways to close and cover an incision, including stitches, skin glue, and adhesive strips. Follow your health care provider's instructions on: ? Incision care. ? Bandage (dressing) changes and removal. ? Incision closure removal.  Apply ice to the injured area: ? Put ice in a plastic bag. ? Place a towel between your skin and the bag. ? Leave the ice on for 20 minutes, 2-3 times a day.  If physical therapy and exercises are prescribed by your health care provider, do them as directed.  Keep all follow-up visits as directed by your health care provider. This is important. Contact a health care provider if: You have a fever. Get  help right away if:  You have drainage, redness, swelling, or increasing pain at the incision site.  You notice a bad smell coming from the incision site or dressing.  Your incision site breaks open after your stitches or tape has been removed. This information is not intended to replace advice given to you by your health care provider. Make sure you discuss any questions you have  with your health care provider. Document Released: 10/13/2013 Document Revised: 11/12/2015 Document Reviewed: 05/07/2013 Elsevier Interactive Patient Education  2017 [employer] General Anesthesia, Adult, Care After These instructions provide you with information about caring for yourself after your procedure. Your health care provider may also give you more specific instructions. Your treatment has been planned according to current medical practices, but problems sometimes occur. Call your health care provider if you have any problems or questions after your procedure. What can I expect after the procedure? After the procedure, it is common to have:  Vomiting.  A sore throat.  Mental slowness.  It is common to feel:  Nauseous.  Cold or shivery.  Sleepy.  Tired.  Sore or achy, even in parts of your body where you did not have surgery.  Follow these instructions at home: For at least 24 hours after the procedure:  Do not: ? Participate in activities where you could fall or become injured. ? Drive. ? Use heavy machinery. ? Drink alcohol. ? Take sleeping pills or medicines that cause drowsiness. ? Make important decisions or sign legal documents. ? Take care of children on your own.  Rest. Eating and drinking  If you vomit, drink water, juice, or soup when you can drink without vomiting.  Drink enough fluid to keep your urine clear or pale yellow.  Make sure you have little or no nausea before eating solid foods.  Follow the diet recommended by your health care provider. General instructions  Have a responsible adult stay with you until you are awake and alert.  Return to your normal activities as told by your health care provider. Ask your health care provider what activities are safe for you.  Take over-the-counter and prescription medicines only as told by your health care provider.  If you smoke, do not smoke without supervision.  Keep all follow-up  visits as told by your health care provider. This is important. Contact a health care provider if:  You continue to have nausea or vomiting at home, and medicines are not helpful.  You cannot drink fluids or start eating again.  You cannot urinate after 8-12 hours.  You develop a skin rash.  You have fever.  You have increasing redness at the site of your procedure. Get help right away if:  You have difficulty breathing.  You have chest pain.  You have unexpected bleeding.  You feel that you are having a life-threatening or urgent problem. This information is not intended to replace advice given to you by your health care provider. Make sure you discuss any questions you have with your health care provider. Document Released: 06/24/2000 Document Revised: 08/21/2015 Document Reviewed: 03/02/2015 Elsevier Interactive Patient Education  [employer].

## 2016-11-15 ENCOUNTER — Encounter (HOSPITAL_COMMUNITY): Payer: Self-pay | Admitting: Orthopedic Surgery

## 2016-11-18 ENCOUNTER — Telehealth: Payer: Self-pay | Admitting: Orthopedic Surgery

## 2016-11-18 NOTE — Telephone Encounter (Signed)
Call from patient - states both ankles are swelling, since Saturday, 11/16/16; notes had shoulder surgery 11/14/16.  Relayed Dr Romeo Apple out of office, also no clinical staff available.  Relayed to contact primary care or Emergency room, as well as noting in her chart.

## 2016-11-18 NOTE — Telephone Encounter (Signed)
Lasix 20 mg bid x 5 days

## 2016-11-19 ENCOUNTER — Other Ambulatory Visit: Payer: Self-pay | Admitting: *Deleted

## 2016-11-19 MED ORDER — FUROSEMIDE 20 MG PO TABS: 20.0000 mg | ORAL_TABLET | Freq: Two times a day (BID) | ORAL | 0 refills | Status: DC

## 2016-11-19 NOTE — Telephone Encounter (Signed)
Please advise medication for swelling sent to pharmacy

## 2016-11-19 NOTE — Telephone Encounter (Signed)
Called patient to notify

## 2016-11-25 ENCOUNTER — Ambulatory Visit (INDEPENDENT_AMBULATORY_CARE_PROVIDER_SITE_OTHER): Payer: Self-pay | Admitting: Orthopedic Surgery

## 2016-11-25 DIAGNOSIS — Z9889 Other specified postprocedural states: Secondary | ICD-10-CM

## 2016-11-25 DIAGNOSIS — Z4889 Encounter for other specified surgical aftercare: Secondary | ICD-10-CM

## 2016-11-25 NOTE — Patient Instructions (Signed)
Home exercise

## 2016-11-25 NOTE — Progress Notes (Signed)
Patient is postop day 11 status post arthroscopy left shoulder  She's doing well  Her incisions are clean and dry sutures were removed her shoulder pain has improved  Encounter Diagnoses  Name Primary?  Marland Kitchen Aftercare following surgery Yes  . S/P shoulder surgery    Start home exercises return in 4 weeks

## 2016-11-29 ENCOUNTER — Ambulatory Visit (INDEPENDENT_AMBULATORY_CARE_PROVIDER_SITE_OTHER): Payer: Medicare Other | Admitting: Orthopedic Surgery

## 2016-11-29 DIAGNOSIS — Z4889 Encounter for other specified surgical aftercare: Secondary | ICD-10-CM

## 2016-11-29 NOTE — Patient Instructions (Signed)
Steps to Quit Smoking Smoking tobacco can be bad for your health. It can also affect almost every organ in your body. Smoking puts you and people around you at risk for many serious Alyssa Poole-lasting (chronic) diseases. Quitting smoking is hard, but it is one of the best things that you can do for your health. It is never too late to quit. What are the benefits of quitting smoking? When you quit smoking, you lower your risk for getting serious diseases and conditions. They can include:  Lung cancer or lung disease.  Heart disease.  Stroke.  Heart attack.  Not being able to have children (infertility).  Weak bones (osteoporosis) and broken bones (fractures).  If you have coughing, wheezing, and shortness of breath, those symptoms may get better when you quit. You may also get sick less often. If you are pregnant, quitting smoking can help to lower your chances of having a baby of low birth weight. What can I do to help me quit smoking? Talk with your doctor about what can help you quit smoking. Some things you can do (strategies) include:  Quitting smoking totally, instead of slowly cutting back how much you smoke over a period of time.  Going to in-person counseling. You are more likely to quit if you go to many counseling sessions.  Using resources and support systems, such as: ? Online chats with a counselor. ? Phone quitlines. ? Printed self-help materials. ? Support groups or group counseling. ? Text messaging programs. ? Mobile phone apps or applications.  Taking medicines. Some of these medicines may have nicotine in them. If you are pregnant or breastfeeding, do not take any medicines to quit smoking unless your doctor says it is okay. Talk with your doctor about counseling or other things that can help you.  Talk with your doctor about using more than one strategy at the same time, such as taking medicines while you are also going to in-person counseling. This can help make  quitting easier. What things can I do to make it easier to quit? Quitting smoking might feel very hard at first, but there is a lot that you can do to make it easier. Take these steps:  Talk to your family and friends. Ask them to support and encourage you.  Call phone quitlines, reach out to support groups, or work with a counselor.  Ask people who smoke to not smoke around you.  Avoid places that make you want (trigger) to smoke, such as: ? Bars. ? Parties. ? Smoke-break areas at work.  Spend time with people who do not smoke.  Lower the stress in your life. Stress can make you want to smoke. Try these things to help your stress: ? Getting regular exercise. ? Deep-breathing exercises. ? Yoga. ? Meditating. ? Doing a body scan. To do this, close your eyes, focus on one area of your body at a time from head to toe, and notice which parts of your body are tense. Try to relax the muscles in those areas.  Download or buy apps on your mobile phone or tablet that can help you stick to your quit plan. There are many free apps, such as QuitGuide from the CDC (Centers for Disease Control and Prevention). You can find more support from smokefree.gov and other websites.  This information is not intended to replace advice given to you by your health care provider. Make sure you discuss any questions you have with your health care provider. Document Released: 01/12/2009 Document   Revised: 11/14/2015 Document Reviewed: 08/02/2014 Elsevier Interactive Patient Education  2018 [employer] Inc[employer]  

## 2016-11-29 NOTE — Progress Notes (Signed)
Unscheduled visit  Patient developed blisters over her operative shoulder which came on yesterday and went away yesterday and are gone today. Perhaps heat rash  No blisters noted today but evidence of previous blisters noted. They're not present so there is no worry of shingles  Otherwise doing well  Continue with current treatment and follow-up as scheduled  Encounter Diagnosis  Name Primary?  Marland Kitchen. Aftercare following surgery Yes

## 2016-12-11 ENCOUNTER — Telehealth: Payer: Self-pay | Admitting: Orthopedic Surgery

## 2016-12-11 ENCOUNTER — Other Ambulatory Visit: Payer: Self-pay | Admitting: Orthopedic Surgery

## 2016-12-11 MED ORDER — HYDROCODONE-ACETAMINOPHEN 7.5-325 MG PO TABS: 1.0000 | ORAL_TABLET | Freq: Four times a day (QID) | ORAL | 0 refills | Status: DC | PRN

## 2016-12-11 NOTE — Telephone Encounter (Signed)
Patient requests refill on Oxycodone/Acetaminophen 5-325  Mgs.   Qty  30   Sig: Take 1 tablet by mouth every 4 (four) hours as needed for severe pain.

## 2016-12-12 NOTE — Telephone Encounter (Signed)
Patient called back - in addition to pain medication refill, which appears to have been addressed (not yet printed out/signed), she states that the medication:  predniSONE (DELTASONE) 10 MG tablet 90 tablet   - from [facility], [address], [place],    "not helping; therefore, not taking.  Any advice, or wait until upcoming appointment 12/20/16?

## 2016-12-16 ENCOUNTER — Ambulatory Visit: Payer: Medicare Other | Admitting: Orthopedic Surgery

## 2016-12-20 ENCOUNTER — Ambulatory Visit: Payer: Medicare Other | Admitting: Orthopedic Surgery

## 2016-12-23 ENCOUNTER — Encounter: Payer: Self-pay | Admitting: Orthopedic Surgery

## 2016-12-23 ENCOUNTER — Ambulatory Visit (INDEPENDENT_AMBULATORY_CARE_PROVIDER_SITE_OTHER): Payer: Self-pay | Admitting: Orthopedic Surgery

## 2016-12-23 VITALS — BP 121/79 | HR 69 | Ht 62.0 in | Wt 133.0 lb

## 2016-12-23 DIAGNOSIS — Z9889 Other specified postprocedural states: Secondary | ICD-10-CM

## 2016-12-23 DIAGNOSIS — Z4889 Encounter for other specified surgical aftercare: Secondary | ICD-10-CM

## 2016-12-23 MED ORDER — HYDROCODONE-ACETAMINOPHEN 7.5-325 MG PO TABS: 1.0000 | ORAL_TABLET | Freq: Four times a day (QID) | ORAL | 0 refills | Status: DC | PRN

## 2016-12-23 NOTE — Progress Notes (Signed)
Encounter Diagnoses  Name Primary?  Marland Kitchen Aftercare following surgery Yes  . S/P shoulder surgery     Chief Complaint  Patient presents with  . Follow-up    Recheck on left shoulder, DOS 11-14-16.    This is to 6 week postop visit  Patient complains of pain over the left shoulder. She has done home exercises she has excellent range of motion. No weakness  Just pain to palpation  She did try to increase her prednisone but the swelling became too much she is on naproxen as well she is back down to 10 mg prednisone  She's not on any pain medication at present  Everything else looks fine recommend refill the hydrocodone rest the shoulder until she comes back follow-up in 6 weeks

## 2017-01-29 ENCOUNTER — Emergency Department (HOSPITAL_COMMUNITY): Payer: Medicare Other

## 2017-01-29 ENCOUNTER — Emergency Department (HOSPITAL_COMMUNITY)
Admission: EM | Admit: 2017-01-29 | Discharge: 2017-01-30 | Disposition: A | Discharge status: Other Acute Inpatient Hospital | Payer: Medicare Other | Attending: Neurosurgery | Admitting: Neurosurgery

## 2017-01-29 ENCOUNTER — Encounter (HOSPITAL_COMMUNITY): Payer: Self-pay | Admitting: Emergency Medicine

## 2017-01-29 DIAGNOSIS — R4182 Altered mental status, unspecified: Secondary | ICD-10-CM

## 2017-01-29 DIAGNOSIS — I609 Nontraumatic subarachnoid hemorrhage, unspecified: Secondary | ICD-10-CM | POA: Diagnosis not present

## 2017-01-29 DIAGNOSIS — F17200 Nicotine dependence, unspecified, uncomplicated: Secondary | ICD-10-CM | POA: Diagnosis not present

## 2017-01-29 DIAGNOSIS — Z79899 Other long term (current) drug therapy: Secondary | ICD-10-CM | POA: Diagnosis not present

## 2017-01-29 DIAGNOSIS — I671 Cerebral aneurysm, nonruptured: Secondary | ICD-10-CM | POA: Diagnosis not present

## 2017-01-29 DIAGNOSIS — R51 Headache: Secondary | ICD-10-CM | POA: Diagnosis present

## 2017-01-29 DIAGNOSIS — Z7982 Long term (current) use of aspirin: Secondary | ICD-10-CM | POA: Diagnosis not present

## 2017-01-29 DIAGNOSIS — Z96652 Presence of left artificial knee joint: Secondary | ICD-10-CM | POA: Insufficient documentation

## 2017-01-29 DIAGNOSIS — I10 Essential (primary) hypertension: Secondary | ICD-10-CM | POA: Diagnosis not present

## 2017-01-29 DIAGNOSIS — J45909 Unspecified asthma, uncomplicated: Secondary | ICD-10-CM | POA: Insufficient documentation

## 2017-01-29 HISTORY — DX: Other specified disorders of binocular movement: H51.8

## 2017-01-29 HISTORY — DX: Other chronic pain: G89.29

## 2017-01-29 HISTORY — DX: Cervicalgia: M54.2

## 2017-01-29 HISTORY — DX: Headache: R51

## 2017-01-29 HISTORY — DX: Headache, unspecified: R51.9

## 2017-01-29 LAB — URINALYSIS, ROUTINE W REFLEX MICROSCOPIC
BILIRUBIN URINE: NEGATIVE
Glucose, UA: NEGATIVE mg/dL
Hgb urine dipstick: NEGATIVE
KETONES UR: NEGATIVE mg/dL
Leukocytes, UA: NEGATIVE
NITRITE: NEGATIVE
Protein, ur: NEGATIVE mg/dL
Specific Gravity, Urine: 1.015 (ref 1.005–1.030)
pH: 7 (ref 5.0–8.0)

## 2017-01-29 LAB — COMPREHENSIVE METABOLIC PANEL
ALBUMIN: 4.2 g/dL (ref 3.5–5.0)
ALT: 20 U/L (ref 14–54)
ANION GAP: 9 (ref 5–15)
AST: 20 U/L (ref 15–41)
Alkaline Phosphatase: 28 U/L — ABNORMAL LOW (ref 38–126)
BILIRUBIN TOTAL: 0.5 mg/dL (ref 0.3–1.2)
BUN: 13 mg/dL (ref 6–20)
CO2: 24 mmol/L (ref 22–32)
Calcium: 8.4 mg/dL — ABNORMAL LOW (ref 8.9–10.3)
Chloride: 103 mmol/L (ref 101–111)
Creatinine, Ser: 0.71 mg/dL (ref 0.44–1.00)
GFR calc Af Amer: >60 mL/min (ref >60)
GFR calc non Af Amer: >60 mL/min (ref >60)
GLUCOSE: 115 mg/dL — AB (ref 65–99)
POTASSIUM: 3.2 mmol/L — AB (ref 3.5–5.1)
SODIUM: 136 mmol/L (ref 135–145)
TOTAL PROTEIN: 6.7 g/dL (ref 6.5–8.1)

## 2017-01-29 LAB — BLOOD GAS, ARTERIAL
ACID-BASE DEFICIT: 2.1 mmol/L — AB (ref 0.0–2.0)
Bicarbonate: 22.9 mmol/L (ref 20.0–28.0)
DRAWN BY: 28459
FIO2: 40
MECHVT: 500 mL
O2 Saturation: 98.8 %
PEEP/CPAP: 5 cmH2O
PO2 ART: 176 mmHg — AB (ref 83.0–108.0)
Patient temperature: 37
RATE: 15 resp/min
pCO2 arterial: 34.7 mmHg (ref 32.0–48.0)
pH, Arterial: 7.414 (ref 7.350–7.450)

## 2017-01-29 LAB — CBC WITH DIFFERENTIAL/PLATELET
BASOS ABS: 0 10*3/uL (ref 0.0–0.1)
BASOS PCT: 0 %
Eosinophils Absolute: 0 10*3/uL (ref 0.0–0.7)
Eosinophils Relative: 0 %
HEMATOCRIT: 32.9 % — AB (ref 36.0–46.0)
HEMOGLOBIN: 10.9 g/dL — AB (ref 12.0–15.0)
Lymphocytes Relative: 15 %
Lymphs Abs: 2 10*3/uL (ref 0.7–4.0)
MCH: 32 pg (ref 26.0–34.0)
MCHC: 33.1 g/dL (ref 30.0–36.0)
MCV: 96.5 fL (ref 78.0–100.0)
MONOS PCT: 11 %
Monocytes Absolute: 1.6 10*3/uL — ABNORMAL HIGH (ref 0.1–1.0)
NEUTROS ABS: 10.2 10*3/uL — AB (ref 1.7–7.7)
NEUTROS PCT: 74 %
Platelets: 394 10*3/uL (ref 150–400)
RBC: 3.41 MIL/uL — AB (ref 3.87–5.11)
RDW: 13.7 % (ref 11.5–15.5)
WBC: 13.8 10*3/uL — AB (ref 4.0–10.5)

## 2017-01-29 LAB — RAPID URINE DRUG SCREEN, HOSP PERFORMED
AMPHETAMINES: NOT DETECTED
Barbiturates: NOT DETECTED
Benzodiazepines: NOT DETECTED
Cocaine: NOT DETECTED
Opiates: POSITIVE — AB
TETRAHYDROCANNABINOL: NOT DETECTED

## 2017-01-29 LAB — SALICYLATE LEVEL: Salicylate Lvl: <7 mg/dL (ref 2.8–30.0)

## 2017-01-29 LAB — ACETAMINOPHEN LEVEL

## 2017-01-29 LAB — ETHANOL: Alcohol, Ethyl (B): <10 mg/dL (ref <10)

## 2017-01-29 LAB — TRIGLYCERIDES: TRIGLYCERIDES: 204 mg/dL — AB (ref <150)

## 2017-01-29 LAB — I-STAT CREATININE, ED: CREATININE: 0.6 mg/dL (ref 0.44–1.00)

## 2017-01-29 MED ORDER — SODIUM CHLORIDE 0.9 % IV SOLN
INTRAVENOUS | Status: DC
  Administered 2017-01-29: 20:00:00 via INTRAVENOUS

## 2017-01-29 MED ORDER — IOPAMIDOL (ISOVUE-370) INJECTION 76%
100.0000 mL | Freq: Once | INTRAVENOUS | Status: AC | PRN
  Administered 2017-01-29: 100 mL via INTRAVENOUS

## 2017-01-29 MED ORDER — SUCCINYLCHOLINE CHLORIDE 20 MG/ML IJ SOLN
60.0000 mg | Freq: Once | INTRAMUSCULAR | Status: AC
  Administered 2017-01-29: 60 mg via INTRAVENOUS

## 2017-01-29 MED ORDER — MIDAZOLAM HCL 2 MG/2ML IJ SOLN
INTRAMUSCULAR | Status: AC
  Filled 2017-01-29: qty 2

## 2017-01-29 MED ORDER — PROPOFOL 1000 MG/100ML IV EMUL
INTRAVENOUS | Status: AC
  Filled 2017-01-29: qty 100

## 2017-01-29 MED ORDER — FENTANYL CITRATE (PF) 100 MCG/2ML IJ SOLN
50.0000 ug | INTRAMUSCULAR | Status: DC | PRN
  Administered 2017-01-29: 50 ug via INTRAVENOUS

## 2017-01-29 MED ORDER — ONDANSETRON HCL 4 MG/2ML IJ SOLN
4.0000 mg | Freq: Once | INTRAMUSCULAR | Status: AC
  Administered 2017-01-29: 4 mg via INTRAVENOUS
  Filled 2017-01-29: qty 2

## 2017-01-29 MED ORDER — PROPOFOL 1000 MG/100ML IV EMUL: 5.0000 ug/kg/min | INTRAVENOUS | Status: DC

## 2017-01-29 MED ORDER — FENTANYL CITRATE (PF) 100 MCG/2ML IJ SOLN
INTRAMUSCULAR | Status: AC
  Administered 2017-01-29: 50 ug via INTRAVENOUS
  Filled 2017-01-29: qty 2

## 2017-01-29 MED ORDER — MIDAZOLAM HCL 2 MG/2ML IJ SOLN
2.0000 mg | Freq: Once | INTRAMUSCULAR | Status: AC
  Administered 2017-01-29: 2 mg via INTRAVENOUS

## 2017-01-29 MED ORDER — MIDAZOLAM HCL 2 MG/2ML IJ SOLN: 2.0000 mg | Freq: Once | INTRAMUSCULAR | Status: DC | PRN

## 2017-01-29 MED ORDER — ONDANSETRON 8 MG PO TBDP: 8.0000 mg | ORAL_TABLET | Freq: Once | ORAL | Status: DC

## 2017-01-29 MED ORDER — ETOMIDATE 2 MG/ML IV SOLN
20.0000 mg | Freq: Once | INTRAVENOUS | Status: AC
  Administered 2017-01-29: 20 mg via INTRAVENOUS

## 2017-01-29 MED ORDER — PROPOFOL 1000 MG/100ML IV EMUL
5.0000 ug/kg/min | Freq: Once | INTRAVENOUS | Status: AC
  Administered 2017-01-29: 5 ug/kg/min via INTRAVENOUS

## 2017-01-29 NOTE — ED Notes (Signed)
7 1/2 airway placed 23 at the lip.

## 2017-01-29 NOTE — ED Triage Notes (Signed)
Pt reports woke up this am with headache. Pt reports n/v/d ever since. Pt reports history of migraines and states light and sound sensitivity.

## 2017-01-29 NOTE — ED Provider Notes (Signed)
[facility] EMERGENCY DEPARTMENT Provider Note   CSN: [id number] Arrival date & time: 01/29/17  1809     History   Chief Complaint Chief Complaint  Patient presents with  . Headache    HPI Alyssa Poole is a 56 y.o. female.  HPI  Pt was seen at 1900. Per pt and her husband, c/o gradual onset and persistence of constant acute flair of her chronic migraine headache since waking up this morning. Has been associated with multiple intermittent episodes of N/V/D.  Describes the headache as per her usual chronic headache pain pattern for years. States she took a Neurontin and Lortab PTA.  Denies headache was sudden or maximal in onset or at any time.  Denies visual changes, no focal motor weakness, no tingling/numbness in extremities, no fevers, no neck pain, no injury, no rash.     Past Medical History:  Diagnosis Date  . Anxiety   . Arthritis   . Asthma   . Chronic headaches   . Chronic neck pain   . Depression 2002  . Dysconjugate gaze   . GERD (gastroesophageal reflux disease)   . Hypertension   . Neuromuscular disorder (HCC)   . Osteoporosis   . Panic attacks 2002    Patient Active Problem List   Diagnosis Date Noted  . Arthrosis of left acromioclavicular joint   . Rotator cuff syndrome of left shoulder   . Primary osteoarthritis of left shoulder   . Surgical aftercare, musculoskeletal system 10/11/2014  . Status post total left knee replacement 10/11/2014  . Arthritis of left knee 09/14/2014  . Knee contusion 12/27/2013  . Medial meniscus, posterior horn derangement 12/27/2013  . Arthritis of knee, left 12/27/2013  . Left knee pain 12/27/2013  . 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 KNEE PAIN 05/11/2007  . DISC DEGENERATION 05/11/2007  . SCIATICA 05/11/2007    Past Surgical History:  Procedure Laterality Date  . BACK SURGERY     x4  . CERVICAL SPINE SURGERY     plate in neck  . CHOLECYSTECTOMY    . KNEE ARTHROSCOPY WITH MEDIAL MENISECTOMY Left  03/09/2014   Procedure: LEFT KNEE ARTHROSCOPY WITH PARTIAL MEDIAL MENISECTOMY;  Surgeon: Vickki Hearing, MD;  Location: [facility];  Service: Orthopedics;  Laterality: Left;  . SHOULDER ARTHROSCOPY Left 11/14/2016   Procedure: ARTHROSCOPY SHOULDER WITH LIMITED DEDBRIDEMENT AND ACROMIOPLASTY AND DISTAL CLAVICAL EXCISION;  Surgeon: Vickki Hearing, MD;  Location: [facility];  Service: Orthopedics;  Laterality: Left;  . TOTAL KNEE ARTHROPLASTY Left 09/14/2014   Procedure: LEFT TOTAL KNEE ARTHROPLASTY;  Surgeon: Vickki Hearing, MD;  Location: [facility];  Service: Orthopedics;  Laterality: Left;    OB History    No data available       Home Medications    Prior to Admission medications   Medication Sig Start Date End Date Taking? Authorizing Provider  alendronate (FOSAMAX) 70 MG tablet Take 70 mg by mouth every 7 (seven) days. Takes on Sunday. Take with a full glass of water on an empty stomach.   Yes [provider]  aspirin EC 81 MG tablet Take 81 mg by mouth daily.   Yes [provider]  Black Cohosh 540 MG CAPS Take 1 capsule by mouth daily.   Yes [provider]  cetirizine (ZYRTEC) 10 MG tablet Take 10 mg by mouth daily.   Yes [provider]  citalopram (CELEXA) 20 MG tablet Take 20 mg by mouth daily. 10/11/15  Yes [provider]  [employer] CALCIUM 600+D 600-800 MG-UNIT TABS Take 1 tablet by mouth daily. 10/08/15  Yes [provider]  gabapentin (NEURONTIN) 300 MG capsule Take 300 mg by mouth 3 (three) times daily.    Yes [provider]  HYDROcodone-acetaminophen (NORCO) 10-325 MG tablet TAKE ONE TABLET BY MOUTH FOUR TIMES DAILY AS NEEDED FOR PAIN 01/09/17  Yes [provider]  Ibuprofen-Diphenhydramine HCl (ADVIL PM) 200-25 MG CAPS Take 1-2 capsules by mouth at bedtime as needed.   Yes [provider]  losartan-hydrochlorothiazide (HYZAAR) 50-12.5 MG per tablet Take 1 tablet by mouth daily.     Yes [provider]  Multiple Vitamin (MULTIVITAMIN WITH MINERALS) TABS tablet Take 1 tablet by mouth daily.   Yes [provider]  naproxen (NAPROSYN) 500 MG tablet Take 500 mg by mouth 2 (two) times daily with a meal.   Yes [provider]  pantoprazole (PROTONIX) 40 MG tablet Take 40 mg by mouth Daily.  01/27/12  Yes [provider]  predniSONE (DELTASONE) 10 MG tablet Take 1 tablet (10 mg total) by mouth daily with breakfast. Patient taking differently: Take 10-20 mg by mouth daily as needed (FOR PAIN).  09/03/16  Yes Vickki Hearing, MD  topiramate (TOPAMAX) 100 MG tablet Take 100 mg by mouth at bedtime. 09/22/15  Yes [provider]  ALPRAZolam Prudy Feeler) 0.5 MG tablet ONE TABLET 30 MINUTES PRIOR TO MRI Patient not taking: Reported on 01/29/2017 09/24/16   Vickki Hearing, MD  furosemide (LASIX) 20 MG tablet Take 1 tablet (20 mg total) by mouth 2 (two) times daily. 11/19/16 11/24/16  Vickki Hearing, MD  HYDROcodone-acetaminophen (NORCO) 7.5-325 MG tablet Take 1 tablet by mouth every 6 (six) hours as needed for moderate pain. Patient not taking: Reported on 01/29/2017 12/23/16   Vickki Hearing, MD  oxyCODONE-acetaminophen (ROXICET) 5-325 MG tablet Take 1 tablet by mouth every 4 (four) hours as needed for severe pain. Patient not taking: Reported on 01/29/2017 11/14/16   Vickki Hearing, MD  potassium chloride SA (K-DUR,KLOR-CON) 20 MEQ tablet Take 2 tablets (40 mEq total) by mouth 3 (three) times daily. Patient not taking: Reported on 01/29/2017 11/13/16   Vickki Hearing, MD    Family History History reviewed. No pertinent family history.  Social History Social History  Substance Use Topics  . Smoking status: Current Some Day Smoker    Packs/day: 1.00    Years: 41.00  . Smokeless tobacco: Never Used  . Alcohol use No     Allergies   Bee venom   Review of Systems Review of Systems ROS: Statement: All systems negative except as  marked or noted in the HPI; Constitutional: Negative for fever and chills. ; ; Eyes: Negative for eye pain, redness and discharge. ; ; ENMT: Negative for ear pain, hoarseness, nasal congestion, sinus pressure and sore throat. ; ; Cardiovascular: Negative for chest pain, palpitations, diaphoresis, dyspnea and peripheral edema. ; ; Respiratory: Negative for cough, wheezing and stridor. ; ; Gastrointestinal: +N/V/D. Negative for abdominal pain, blood in stool, hematemesis, jaundice and rectal bleeding. . ; ; Genitourinary: Negative for dysuria, flank pain and hematuria. ; ; Musculoskeletal: Negative for back pain and neck pain. Negative for swelling and trauma.; ; Skin: Negative for pruritus, rash, abrasions, blisters, bruising and skin lesion.; ; Neuro: +headache. Negative for lightheadedness and neck stiffness. Negative for weakness, altered level of consciousness, altered mental status, extremity weakness, paresthesias, involuntary movement, seizure and syncope.      Physical Exam  Updated Vital Signs BP 136/73 (BP Location: Right Arm)   Pulse 68   Temp 98.6 F (37 C) (Oral)   Resp 18   Ht 5\' 2"  (1.575 m)   Wt 54.4 kg (120 lb)   SpO2 96%   BMI 21.95 kg/m   Patient Vitals for the past 24 hrs:  BP Temp Temp src Pulse Resp SpO2 Height Weight  01/29/17 2215 102/75 98.6 F (37 C) - 68 (!) 22 100 % - -  01/29/17 2200 (!) 153/73 - - 83 (!) 24 97 % - -  01/29/17 2150 138/85 - - 77 - 99 % - -  01/29/17 2130 (!) 163/86 - - 63 (!) 22 95 % - -  01/29/17 2128 (!) 144/80 - - 61 (!) 23 94 % - -  01/29/17 2030 133/66 - - 60 (!) 22 96 % - -  01/29/17 2028 (!) 159/73 - - 63 (!) 22 96 % - -  01/29/17 1828 136/73 98.6 F (37 C) Oral 68 18 96 % - -  01/29/17 1826 - - - - - - 5\' 2"  (1.575 m) 54.4 kg (120 lb)     Physical Exam 1905: Physical examination:  Nursing notes reviewed; Vital signs and O2 SAT reviewed;  Constitutional: Well developed, Well nourished, Well hydrated, In no acute distress; Head:   Normocephalic, atraumatic; Eyes: EOMI, PERRL, No scleral icterus; ENMT: Mouth and pharynx normal, Mucous membranes moist; Neck: Supple, Full range of motion, No lymphadenopathy; Cardiovascular: Regular rate and rhythm, No gallop; Respiratory: Breath sounds clear & equal bilaterally, No wheezes.  Speaking full sentences with ease, Normal respiratory effort/excursion; Chest: Nontender, Movement normal; Abdomen: Soft, Nontender, Nondistended, Normal bowel sounds; Genitourinary: No CVA tenderness; Spine:  No midline CS, TS, LS tenderness.;; Extremities: Pulses normal, No tenderness, No edema, No calf edema or asymmetry.; Neuro: Lethargic, opens her eyes and talks with ED staff and family , Major CN grossly intact. No facial droop. Speech clear. No gross focal motor or sensory deficits in extremities.; Skin: Color normal, Warm, Dry.   ED Treatments / Results  Labs (all labs ordered are listed, but only abnormal results are displayed)   EKG  EKG Interpretation None       Radiology   Procedures Procedures (including critical care time)  Airway procedure:  Timeout: Pre-procedure timeout not performed due to emergent nature of procedure; Indication: Unresponsive;  Oxygen Saturation: 100 %; Oxygen concentration: 100 %; Preoxygenation: Bag-valve-mask;  Medication: Etomidate, Succinylcholine; Procedure: Suctioning, RSI, Glidescope laryngoscopy, Endotracheal intubation with 7.53mm cuffed endotracheal tube, Bag-valve-tube ventilation, Mechanical ventilation;  Reassessment: Successful intubation, No bleeding or obvious trauma in oral cavity or posterior pharynx. Teeth intact, without obvious trauma. Breath sounds equal bilaterally, No breath sounds heard over stomach, Chest movement symmetrical, CO2 detector color change, Endotracheal tube fogging, Oxygen saturation normal. Post-procedure xray obtained.     Medications Ordered in ED Medications  0.9 %  sodium chloride infusion ( Intravenous New  Bag/Given 01/29/17 1930)  ondansetron (ZOFRAN) injection 4 mg (4 mg Intravenous Given 01/29/17 1930)     Initial Impression / Assessment and Plan / ED Course  I have reviewed the triage vital signs and the nursing notes.  Pertinent labs & imaging results that were available during my care of the patient were reviewed by me and considered in my medical decision making (see chart for details).  MDM Reviewed: previous chart, nursing note and vitals Reviewed previous: labs Interpretation: labs and CT scan Total time providing critical care: 30-74 minutes. This  excludes time spent performing separately reportable procedures and services. Consults: neurosurgery   CRITICAL CARE Performed by: Laray Anger Total critical care time: 65 minutes Critical care time was exclusive of separately billable procedures and treating other patients. Critical care was necessary to treat or prevent imminent or life-threatening deterioration. Critical care was time spent personally by me on the following activities: development of treatment plan with patient and/or surrogate as well as nursing, discussions with consultants, evaluation of patient's response to treatment, examination of patient, obtaining history from patient or surrogate, ordering and performing treatments and interventions, ordering and review of laboratory studies, ordering and review of radiographic studies, pulse oximetry and re-evaluation of patient's condition.   Results for orders placed or performed during the hospital encounter of 01/29/17  Urine rapid drug screen (hosp performed)  Result Value Ref Range   Opiates POSITIVE (A) NONE DETECTED   Cocaine NONE DETECTED NONE DETECTED   Benzodiazepines NONE DETECTED NONE DETECTED   Amphetamines NONE DETECTED NONE DETECTED   Tetrahydrocannabinol NONE DETECTED NONE DETECTED   Barbiturates NONE DETECTED NONE DETECTED  Urinalysis, Routine w reflex microscopic  Result Value Ref Range    Color, Urine YELLOW YELLOW   APPearance CLEAR CLEAR   Specific Gravity, Urine 1.015 1.005 - 1.030   pH 7.0 5.0 - 8.0   Glucose, UA NEGATIVE NEGATIVE mg/dL   Hgb urine dipstick NEGATIVE NEGATIVE   Bilirubin Urine NEGATIVE NEGATIVE   Ketones, ur NEGATIVE NEGATIVE mg/dL   Protein, ur NEGATIVE NEGATIVE mg/dL   Nitrite NEGATIVE NEGATIVE   Leukocytes, UA NEGATIVE NEGATIVE  Comprehensive metabolic panel  Result Value Ref Range   Sodium 136 135 - 145 mmol/L   Potassium 3.2 (L) 3.5 - 5.1 mmol/L   Chloride 103 101 - 111 mmol/L   CO2 24 22 - 32 mmol/L   Glucose, Bld 115 (H) 65 - 99 mg/dL   BUN 13 6 - 20 mg/dL   Creatinine, Ser 5.40 0.44 - 1.00 mg/dL   Calcium 8.4 (L) 8.9 - 10.3 mg/dL   Total Protein 6.7 6.5 - 8.1 g/dL   Albumin 4.2 3.5 - 5.0 g/dL   AST 20 15 - 41 U/L   ALT 20 14 - 54 U/L   Alkaline Phosphatase 28 (L) 38 - 126 U/L   Total Bilirubin 0.5 0.3 - 1.2 mg/dL   GFR calc non Af Amer >60 >60 mL/min   GFR calc Af Amer >60 >60 mL/min   Anion gap 9 5 - 15  Ethanol  Result Value Ref Range   Alcohol, Ethyl (B) <10 <10 mg/dL  CBC with Differential  Result Value Ref Range   WBC 13.8 (H) 4.0 - 10.5 K/uL   RBC 3.41 (L) 3.87 - 5.11 MIL/uL   Hemoglobin 10.9 (L) 12.0 - 15.0 g/dL   HCT 98.1 (L) 19.1 - 47.8 %   MCV 96.5 78.0 - 100.0 fL   MCH 32.0 26.0 - 34.0 pg   MCHC 33.1 30.0 - 36.0 g/dL   RDW 29.5 62.1 - 30.8 %   Platelets 394 150 - 400 K/uL   Neutrophils Relative % 74 %   Neutro Abs 10.2 (H) 1.7 - 7.7 K/uL   Lymphocytes Relative 15 %   Lymphs Abs 2.0 0.7 - 4.0 K/uL   Monocytes Relative 11 %   Monocytes Absolute 1.6 (H) 0.1 - 1.0 K/uL   Eosinophils Relative 0 %   Eosinophils Absolute 0.0 0.0 - 0.7 K/uL   Basophils Relative 0 %   Basophils Absolute 0.0  0.0 - 0.1 K/uL  Salicylate level  Result Value Ref Range   Salicylate Lvl <7.0 2.8 - 30.0 mg/dL  Acetaminophen level  Result Value Ref Range   Acetaminophen (Tylenol), Serum <10 (L) 10 - 30 ug/mL  Blood gas, arterial ([facility]  ONLY)  Result Value Ref Range   FIO2 40.00    Delivery systems VENTILATOR    Mode PRESSURE REGULATED VOLUME CONTROL    VT 500 mL   LHR 15 resp/min   Peep/cpap 5.0 cm H20   pH, Arterial 7.414 7.350 - 7.450   pCO2 arterial 34.7 32.0 - 48.0 mmHg   pO2, Arterial 176 (H) 83.0 - 108.0 mmHg   Bicarbonate 22.9 20.0 - 28.0 mmol/L   Acid-base deficit 2.1 (H) 0.0 - 2.0 mmol/L   O2 Saturation 98.8 %   Patient temperature 37.0    Collection site LEFT RADIAL    Drawn by [phone]    Sample type ARTERIAL DRAW    Allens test (pass/fail) PASS PASS  I-Stat Creatinine, ED (do not order at [facility])  Result Value Ref Range   Creatinine, Ser 0.60 0.44 - 1.00 mg/dL   Ct Head Wo Contrast Result Date: 01/29/2017 CLINICAL DATA:  Headache with nausea, vomiting, and diarrhea. Photophobia and sound sensitivity. History of migraine headaches. EXAM: CT HEAD WITHOUT CONTRAST TECHNIQUE: Contiguous axial images were obtained from the base of the skull through the vertex without intravenous contrast. COMPARISON:  Multiple exams, including brain MRI dated 04/05/2015. FINDINGS: Brain: Acute intracranial hemorrhage in the prepontine cistern, ambient cisterns, suprasellar cistern, anterior interhemispheric fissure, and tracking along both sylvian fissures. Enlarged lateral ventricles, particularly the temporal horns, with suspicion for trace amount of intraventricular hemorrhage in both occipital horns. Small amount of hemorrhage in the fourth ventricle. The subarachnoid hemorrhage tracks down to the level of the foramen magnum anteriorly. No acute CVA or mass lesion is identified. Vascular: Vessels are obscured by the subarachnoid hemorrhage. Skull: Unremarkable Sinuses/Orbits: Chronic right maxillary sinusitis. Other: No supplemental non-categorized findings. IMPRESSION: 1. Large amount of acute subarachnoid hemorrhage in the basilar cisterns concerning for ruptured aneurysm. Small amount of hemorrhage in the fourth ventricle and trace  hemorrhage dependently in the occipital horn of the lateral ventricles, compatible with a small amount of intraventricular hemorrhage. 2. Mild hydrocephalus with dilated temporal horns of the lateral ventricles. 3. Chronic right maxillary sinusitis. Critical Value/emergent results were called by telephone at the time of interpretation on 01/29/2017 at 8:20 pm to Dr. Samuel JesterKATHLEEN Joenathan Sakuma , who verbally acknowledged these results. Electronically Signed   By: Gaylyn RongWalter  Liebkemann M.D.   On: 01/29/2017 20:25    Ct Angio Head W Or Wo Contrast Result Date: 01/29/2017 CLINICAL DATA:  56 y/o F; subarachnoid hemorrhage. Evaluation for cerebral vasospasm. EXAM: CT ANGIOGRAPHY HEAD TECHNIQUE: Multidetector CT imaging of the head was performed using the standard protocol during bolus administration of intravenous contrast. Multiplanar CT image reconstructions and MIPs were obtained to evaluate the vascular anatomy. CONTRAST:  100 cc Isovue 370 COMPARISON:  01/29/2017 CT of the head. FINDINGS: CTA HEAD Moderate motion degradation. 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 Otherwise no significant stenosis, proximal occlusion, aneurysm, or vascular malformation is identified. Posterior circulation: No significant stenosis, proximal occlusion, aneurysm, or vascular malformation. Venous sinuses: As permitted by contrast timing, patent. Anatomic variants: Anterior communicating artery is present. No posterior communicating artery identified, likely hypoplastic or absent. Left dominant vertebrobasilar system. Delayed phase: Subarachnoid hemorrhage concentrated in basilar  cisterns, prepontine cistern, and left MCA cistern. No brain parenchymal enhancement identified. IMPRESSION: 1. Moderate motion degradation. Altered mental status, unable to follow directions for examination. 2. 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 3. No vasospasm  identified.  No large vessel occlusion. Electronically Signed   By: Mitzi Hansen M.D.   On: 01/29/2017 21:35    Dg Chest Port 1 View Result Date: 01/29/2017 CLINICAL DATA:  Intubation and orogastric tube placement. EXAM: PORTABLE CHEST 1 VIEW COMPARISON:  Radiograph 05/18/2012 FINDINGS: Endotracheal tube tip 2.2 cm from the carina. Tip and side port of the enteric tube below the diaphragm in the stomach. Normal heart size and mediastinal contours. Low lung volumes with bronchovascular crowding. No consolidation, pleural fluid or pneumothorax. Intact osseous structures. IMPRESSION: 1. Endotracheal tube 2.2 cm in the carina. Enteric tube tip and side-port below the diaphragm in the stomach. 2. Low lung volumes without acute pulmonary process. Electronically Signed   By: Rubye Oaks M.D.   On: 01/29/2017 22:40     2035:  CT as above. Pt remains lethargic, but opens her eyes to name and will speak to family and ED staff (name, DOB, time, place). Pt continues to move all extremities well. VS remain stable. Holding own airway.  T/C to Neurosurgery Dr. Wynetta Emery, case discussed, including:  HPI, pertinent PM/SHx, VS/PE, dx testing, ED course and treatment:  Requests to obtain CT-A head to further delineate source of hemorrhage.   2135:  Pt becoming more restless on stretcher. VSS.  T/C to [facility] Neurosurgery Dr. Wynetta Emery, case discussed, including:  HPI, pertinent PM/SHx, VS/PE, dx testing, ED course and treatment:  He has spoken with [facility], no IR MD available and no Neurosurgeon to perform intervention on staff this evening, pt will need transfer to [facility].  2150:  More restless, picking at bedclothes, moving all extremities spontaneously. No longer directable. No longer talking will staff. Will intubate patient.  T/C to [facility] Neurosurgery Dr. Angelyn Punt, case discussed, including:  HPI, pertinent PM/SHx, VS/PE, dx testing, ED course and treatment:  Aware I will be intubating pt and using propofol for  sedation, he is agreeable to accept transfer/admit, requests to keep SBP <140, use IV labetalol prn.   2215:  Pt intubated without difficulty (see note above). OGT and foley placed. PCXR obtained. IV versed and IV propofol started.  2325:  [facility] here for transport. Sats 100% on vent, SBP 108 after meds, HR 80's.         Final Clinical Impressions(s) / ED Diagnoses   Final diagnoses:  None    New Prescriptions New Prescriptions   No medications on file     Samuel Jester, DO 02/02/17 1256

## 2017-01-29 NOTE — ED Notes (Signed)
[facility] given report at this time.  

## 2017-01-29 NOTE — ED Notes (Signed)
Report given to Inetta Fermoina, Consulting civil engineercharge RN [facility].

## 2017-01-29 NOTE — ED Notes (Signed)
Pt LOC is decreasing per DO McManus intubate pt.

## 2017-01-29 NOTE — ED Notes (Signed)
In and Out cath performed. Clean Catch sample collected and sent to lab.

## 2017-01-30 NOTE — ED Notes (Signed)
[facility] @ 0001

## 2017-01-31 ENCOUNTER — Telehealth: Payer: Self-pay | Admitting: Orthopedic Surgery

## 2017-01-31 NOTE — Telephone Encounter (Signed)
Alyssa Poole's son, Alyssa Poole called and stated that he needed to cancel Alyssa Poole's appointment for Monday and any other appointments.  [facility] brain.  He said they are not sure exactly what was going on but they were told that a main artery had collapsed.    They will call back to reschedule her when she is able to do so.

## 2017-02-03 ENCOUNTER — Ambulatory Visit: Payer: Medicare Other | Admitting: Orthopedic Surgery

## 2017-02-17 ENCOUNTER — Ambulatory Visit: Payer: Medicare Other | Admitting: Orthopedic Surgery

## 2017-02-19 DIAGNOSIS — I69319 Unspecified symptoms and signs involving cognitive functions following cerebral infarction: Secondary | ICD-10-CM | POA: Insufficient documentation

## 2017-02-19 DIAGNOSIS — H4922 Sixth [abducent] nerve palsy, left eye: Secondary | ICD-10-CM | POA: Insufficient documentation

## 2017-02-19 DIAGNOSIS — I471 Supraventricular tachycardia: Secondary | ICD-10-CM | POA: Insufficient documentation

## 2017-02-19 DIAGNOSIS — I609 Nontraumatic subarachnoid hemorrhage, unspecified: Secondary | ICD-10-CM | POA: Insufficient documentation

## 2017-02-19 DIAGNOSIS — I4891 Unspecified atrial fibrillation: Secondary | ICD-10-CM | POA: Insufficient documentation

## 2017-02-19 DIAGNOSIS — E871 Hypo-osmolality and hyponatremia: Secondary | ICD-10-CM | POA: Insufficient documentation

## 2017-02-19 DIAGNOSIS — G8929 Other chronic pain: Secondary | ICD-10-CM | POA: Insufficient documentation

## 2017-02-19 DIAGNOSIS — D649 Anemia, unspecified: Secondary | ICD-10-CM | POA: Insufficient documentation

## 2017-02-25 DIAGNOSIS — D72829 Elevated white blood cell count, unspecified: Secondary | ICD-10-CM | POA: Insufficient documentation

## 2017-03-05 DIAGNOSIS — R102 Pelvic and perineal pain: Secondary | ICD-10-CM | POA: Insufficient documentation

## 2017-03-07 DIAGNOSIS — R7401 Elevation of levels of liver transaminase levels: Secondary | ICD-10-CM | POA: Insufficient documentation

## 2017-03-13 ENCOUNTER — Encounter (HOSPITAL_COMMUNITY): Payer: Self-pay

## 2017-03-13 ENCOUNTER — Ambulatory Visit (HOSPITAL_COMMUNITY): Payer: Medicare Other

## 2017-03-13 ENCOUNTER — Other Ambulatory Visit: Payer: Self-pay

## 2017-03-13 ENCOUNTER — Encounter (HOSPITAL_COMMUNITY): Payer: Self-pay | Admitting: Speech Pathology

## 2017-03-13 ENCOUNTER — Ambulatory Visit (HOSPITAL_COMMUNITY): Payer: Medicare Other | Attending: Physical Medicine & Rehabilitation | Admitting: Speech Pathology

## 2017-03-13 DIAGNOSIS — R2689 Other abnormalities of gait and mobility: Secondary | ICD-10-CM | POA: Insufficient documentation

## 2017-03-13 DIAGNOSIS — R41841 Cognitive communication deficit: Secondary | ICD-10-CM | POA: Diagnosis present

## 2017-03-13 DIAGNOSIS — R29898 Other symptoms and signs involving the musculoskeletal system: Secondary | ICD-10-CM | POA: Diagnosis present

## 2017-03-13 DIAGNOSIS — M25512 Pain in left shoulder: Secondary | ICD-10-CM | POA: Insufficient documentation

## 2017-03-13 NOTE — Therapy (Signed)
[facility] [address] [place], [name], [address] Phone: [phone]   Fax:  [phone]  Speech Language Pathology Evaluation  Patient Details  Name: Alyssa RuskBobbie W Poole MRN: [id number] Date of Birth: 1960/12/30 Referring Provider: Renato GailsJohn Aguilar, MD   Encounter Date: 03/13/2017  End of Session - 03/13/17 1716    Visit Number  1    Number of Visits  9    Date for SLP Re-Evaluation  04/15/17    Authorization Type  Medicare    Authorization Time Period  before 10th visit    SLP Start Time  1602    SLP Stop Time   1645    SLP Time Calculation (min)  43 min    Activity Tolerance  Patient tolerated treatment well       Past Medical History:  Diagnosis Date  . Anxiety   . Arthritis   . Asthma   . Chronic headaches   . Chronic neck pain   . Depression 2002  . Dysconjugate gaze   . GERD (gastroesophageal reflux disease)   . Hypertension   . Neuromuscular disorder (HCC)   . Osteoporosis   . Panic attacks 2002    Past Surgical History:  Procedure Laterality Date  . BACK SURGERY     x4  . CERVICAL SPINE SURGERY     plate in neck  . CHOLECYSTECTOMY    . KNEE ARTHROSCOPY WITH MEDIAL MENISECTOMY Left 03/09/2014   Procedure: LEFT KNEE ARTHROSCOPY WITH PARTIAL MEDIAL MENISECTOMY;  Surgeon: Vickki HearingStanley E Harrison, MD;  Location: [facility];  Service: Orthopedics;  Laterality: Left;  . SHOULDER ARTHROSCOPY Left 11/14/2016   Procedure: ARTHROSCOPY SHOULDER WITH LIMITED DEDBRIDEMENT AND ACROMIOPLASTY AND DISTAL CLAVICAL EXCISION;  Surgeon: Vickki HearingHarrison, Stanley E, MD;  Location: [facility];  Service: Orthopedics;  Laterality: Left;  . TOTAL KNEE ARTHROPLASTY Left 09/14/2014   Procedure: LEFT TOTAL KNEE ARTHROPLASTY;  Surgeon: Vickki HearingStanley E Harrison, MD;  Location: [facility];  Service: Orthopedics;  Laterality: Left;    There were no vitals filed for this visit.  Subjective Assessment - 03/13/17 1651    Subjective  "I need help with my memory."    Patient is  accompained by:  Family member    Special Tests  [facility]    Currently in Pain?  No/denies         [facility]/13/17 1651      SLP Visit Information   SLP Received On  03/13/17    Referring Provider  Renato GailsJohn Aguilar, MD    Onset Date  01/29/2017    Medical Diagnosis  subarachnoid hemorrhage      Subjective   Subjective  "I need help with my memory."    Patient/Family Stated Goal  Improve memory      General Information   HPI  Alyssa CoombesBobbie Poole is a 56 y/o right hand dominant female with PMHx significant for left eye blindness, hypertension, anxiety, chronic headaches who was [facility] from an outside hospital with subarachnoid hemorrhage. CT at outside hospital revealed large amount of acute subarachnoid hemorrhage in the basilar cisterns with small amount of hemorrhage in the fourth ventricle and mild hydrocephalus. Right EVD was placed by neurosurgery. Patient underwent cerebral arteriogram by Dr. Thedore MinsSingh. 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 Dissection was treated with overlapping stents. No definitive aneurysm or AVM was identified at that time. In the ICU,the patient self extubated and partially removed her EVD. The patient's ICU  course was further complicated by fever. Infectious workup was negative and the patient was treated empirically with vancomycin and cefepime initially. Ultimately fever was attributed to neurogenic cause. The patient's ICU course was further complicated by agitation which was treated with Precedex, anemia which was treated with transfusion, hypokalemia,cerebral salt wasting, and repeated bolus requirements to maintain euvolemia. The patient underwent repeat cerebral arteriogram on 02/05/2017. 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 There were no aneurysm or other vascular malformations to explain her subarachnoid  hemorrhage at that time. She was admitted to inpatient rehabilitation for comprehensive therapy and continued medical management on 02/19/17 and eventually discharged home with family on 03/07/2017. Dr. Cephus RicherAguilar has referred Ms. Poole for outpatient SLP, OT, and PT.     Behavioral/Cognition  Alert and cooperative    Mobility Status  Ambulates with a cane currently      Balance Screen   Has the patient fallen in the past 6 months  No    Has the patient had a decrease in activity level because of a fear of falling?   No    Is the patient reluctant to leave their home because of a fear of falling?   No      Prior Functional Status   Cognitive/Linguistic Baseline  Baseline deficits    Baseline deficit details  finished 11th grade special education     Type of Home  House     Lives With  Significant other Renea EeLarry Thoman    Available Support  Family    Education  11th grade (Pt was in special education classrooms)    Vocation  On disability      Cognition   Overall Cognitive Status  Impaired/Different from baseline    Area of Impairment  Attention;Memory    Current Attention Level  Alternating    Memory  Decreased short-term memory    Attention  Alternating    Alternating Attention  Impaired    Alternating Attention Impairment  Verbal complex;Functional complex    Memory  Impaired    Memory Impairment  Retrieval deficit;Decreased short term memory;Prospective memory    Decreased Short Term Memory  Verbal basic;Functional basic    Awareness  Impaired    Awareness Impairment  Emergent impairment    Problem Solving  Impaired    Problem Solving Impairment  Functional complex    Comptrollerxecutive Function  Decision Making;Organizing    Organizing  Impaired    Organizing Impairment  Verbal complex    Decision Making  Impaired    Decision Making Impairment  Verbal complex      Auditory Comprehension   Overall Auditory Comprehension  Impaired at baseline mild delays at baseline    Yes/No Questions   Within Functional Limits    Commands  Within Functional Limits    Conversation  Moderately complex    Interfering Components  Processing speed;Working Librarian, academicmemory    EffectiveTechniques  Extra processing time;Repetition;Stressing words      Visual Recognition/Discrimination   Discrimination  Within [employer]inoisFunction Limits      Reading Comprehension   Reading Status  Not tested      Expression   Primary Mode of Expression  Verbal      Verbal Expression   Overall Verbal Expression  Impaired    Initiation  No impairment    Automatic Speech  Name;Social Response;Counting    Level of Generative/Spontaneous Verbalization  Conversation    Repetition  No  impairment    Naming  Impairment    Responsive  76-100% accurate    Confrontation  75-100% accurate    Convergent  75-100% accurate    Divergent  75-100% accurate    Verbal Errors  Aware of errors;Semantic paraphasias    Pragmatics  No impairment    Non-Verbal Means of Communication  Not applicable      Written Expression   Dominant Hand  Right    Written Expression  Not tested      Oral Motor/Sensory Function   Overall Oral Motor/Sensory Function  Appears within functional limits for tasks assessed      Motor Speech   Overall Motor Speech  Appears within functional limits for tasks assessed    Respiration  Within functional limits    Phonation  Normal    Resonance  Within functional limits    Articulation  Within functional limitis    Intelligibility  Intelligible    Motor Planning  Witnin functional limits    Motor Speech Errors  Not applicable    Interfering Components  Premorbid status;Inadequate dentition    Phonation  WFL      Standardized Assessments   Standardized Assessments   Montreal Cognitive Assessment (MOCA)    Montreal Cognitive Assessment (MOCA)   20/30 -3 serial 7s and -4 delayed recall        SLP Short Term Goals - 03/13/17 1812      SLP SHORT TERM GOAL #1   Title  Pt will complete functional memory activities  (mod level) with 90% acc when given mild/mod cues and use of memory strategies.    Baseline  25% independently; 80% with mod/max cues    Time  4    Period  Weeks    Status  New    Target Date  04/15/17      SLP SHORT TERM GOAL #2   Title  Pt will complete selective and alternating attention tasks (moderately complex) with 80% acc and min cues.    Baseline  50%    Time  4    Period  Weeks    Status  New    Target Date  04/15/17      SLP SHORT TERM GOAL #3   Title  Pt will describe objects and pictures by providing at least three salient features as judged by clinician with 90% acc when provided mi/mod cues.     Baseline  mod assist    Time  4    Period  Weeks    Status  New    Target Date  04/15/17      SLP SHORT TERM GOAL #4   Title  Pt will increase divergent naming to 5 items per concrete category with mod cues.    Baseline  2 items    Time  4    Period  Weeks    Status  New    Target Date  04/15/17       SLP Long Term Goals - 03/13/17 1828      SLP LONG TERM GOAL #1   Title  Pt will increase memory skills to [facility] with use of compensatory strategies as needed.     Baseline  min to mi/mod impairment    Time  2    Period  Months    Status  New    Target Date  05/14/17      SLP LONG TERM GOAL #2   Title  Pt will increase verbal expression  skills to [facility] with use of strategies as needed.    Baseline  min to mi/mod impairment    Time  2    Period  Months    Status  New    Target Date  05/14/17       Plan - 03-28-17 1809    Clinical Impression Statement  Pt presents with mild/mod cognitive linguistic deficits s/p SAH characterized by impairments in attention, memory, and verbal expression. Pt will benefit from skilled SLP in order to address the above impairments, maximize independence, and decrease burden of care.   Speech Therapy Frequency  2x / week    Duration  4 weeks    Treatment/Interventions  Patient/family education;Cueing hierarchy;Cognitive  reorganization;Multimodal communcation approach;SLP instruction and feedback;Internal/external aids;Compensatory techniques;Language facilitation    Potential to Achieve Goals  Good    Potential Considerations  Previous level of function    SLP Home Exercise Plan  Pt will completed HEP as assigned to facilitate carryover of treatment strategies and techniques in home environment with assist from caregiver as needed.    Consulted and Agree with Plan of Care  Patient;Family member/caregiver    Family Member Consulted  Significant other, Saachi Zale       Patient will benefit from skilled therapeutic intervention in order to improve the following deficits and impairments:   Cognitive communication deficit  G-Codes - 2017/03/28 1830    Functional Assessment Tool Used  clinical judgment    Functional Limitations  Memory    Memory Current Status [phone])  At least 20 percent but less than 40 percent impaired, limited or restricted    Memory Goal Status (U0454)  At least 1 percent but less than 20 percent impaired, limited or restricted       Problem List Patient Active Problem List   Diagnosis Date Noted  . Arthrosis of left acromioclavicular joint   . Rotator cuff syndrome of left shoulder   . Primary osteoarthritis of left shoulder   . Surgical aftercare, musculoskeletal system 10/11/2014  . Status post total left knee replacement 10/11/2014  . Arthritis of left knee 09/14/2014  . Knee contusion 12/27/2013  . Medial meniscus, posterior horn derangement 12/27/2013  . Arthritis of knee, left 12/27/2013  . Left knee pain 12/27/2013  . 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 KNEE PAIN 05/11/2007  . DISC DEGENERATION 05/11/2007  . SCIATICA 05/11/2007   Thank you,  Havery Moros, CCC-SLP [phone]  [facility] 03-28-2017, 6:31 PM  [place] [facility] [address] [place], [place], [address] Phone: [phone]   Fax:  [phone]  Name:  AMNA WELKER MRN: [id number] Date of Birth: 07-Sep-1960

## 2017-03-13 NOTE — Patient Instructions (Signed)
Home Exercises Program Theraputty Exercises  Do the following exercises several times a day using your affected hand.  1. Roll putty into a ball.  2. Make into a pancake.  3. Roll putty into a roll.  4. Pinch along log with first finger and thumb.   5. Make into a ball.  6. Roll it back into a log.   7. Pinch using thumb and side of first finger.  8. Roll into a ball, then flatten into a pancake.  9. Using your fingers, make putty into a mountain.  10. Squeeze/release 10 times.

## 2017-03-14 ENCOUNTER — Encounter (HOSPITAL_COMMUNITY): Payer: Self-pay

## 2017-03-14 ENCOUNTER — Ambulatory Visit (HOSPITAL_COMMUNITY): Payer: Medicare Other

## 2017-03-14 DIAGNOSIS — M25512 Pain in left shoulder: Secondary | ICD-10-CM

## 2017-03-14 DIAGNOSIS — R29898 Other symptoms and signs involving the musculoskeletal system: Secondary | ICD-10-CM

## 2017-03-14 DIAGNOSIS — R41841 Cognitive communication deficit: Secondary | ICD-10-CM | POA: Diagnosis not present

## 2017-03-14 NOTE — Therapy (Signed)
[facility] [address] [place], [name], [address] Phone: [phone]   Fax:  [phone]  Occupational Therapy Treatment  Patient Details  Name: Alyssa Poole MRN: [id number] Date of Birth: 1960-09-23 No Data Recorded  Encounter Date: 03/14/2017  OT End of Session - 03/14/17 0935    Visit Number  2    Number of Visits  8    Date for OT Re-Evaluation  04/12/17    Authorization Type  1) Medicare 2) Medicaid    Authorization Time Period  before 10th visit    Authorization - Visit Number  2    Authorization - Number of Visits  10    OT Start Time  0908 pt was checked in late    OT Stop Time  0945    OT Time Calculation (min)  37 min    Activity Tolerance  Patient tolerated treatment well    Behavior During Therapy  [facility] for tasks assessed/performed       Past Medical History:  Diagnosis Date  . Anxiety   . Arthritis   . Asthma   . Chronic headaches   . Chronic neck pain   . Depression 2002  . Dysconjugate gaze   . GERD (gastroesophageal reflux disease)   . Hypertension   . Neuromuscular disorder (HCC)   . Osteoporosis   . Panic attacks 2002    Past Surgical History:  Procedure Laterality Date  . BACK SURGERY     x4  . CERVICAL SPINE SURGERY     plate in neck  . CHOLECYSTECTOMY    . KNEE ARTHROSCOPY WITH MEDIAL MENISECTOMY Left 03/09/2014   Procedure: LEFT KNEE ARTHROSCOPY WITH PARTIAL MEDIAL MENISECTOMY;  Surgeon: Vickki HearingStanley E Harrison, MD;  Location: [facility];  Service: Orthopedics;  Laterality: Left;  . SHOULDER ARTHROSCOPY Left 11/14/2016   Procedure: ARTHROSCOPY SHOULDER WITH LIMITED DEDBRIDEMENT AND ACROMIOPLASTY AND DISTAL CLAVICAL EXCISION;  Surgeon: Vickki HearingHarrison, Stanley E, MD;  Location: [facility];  Service: Orthopedics;  Laterality: Left;  . TOTAL KNEE ARTHROPLASTY Left 09/14/2014   Procedure: LEFT TOTAL KNEE ARTHROPLASTY;  Surgeon: Vickki HearingStanley E Harrison, MD;  Location: [facility];  Service: Orthopedics;  Laterality: Left;     There were no vitals filed for this visit.  Subjective Assessment - 03/14/17 0926    Subjective   S: I slept on my left shoulder last night.     Currently in Pain?  Yes    Pain Score  3     Pain Location  Shoulder    Pain Orientation  Right    Pain Descriptors / Indicators  Aching;Sharp    Pain Type  Acute pain         [facility] OT Assessment - 03/14/17 0927      Assessment   Medical Diagnosis  BUE weakness      Precautions   Precautions  Other (comment)    Precaution Comments  Low vision- left eye blind      Palpation   Palpation comment  mod fascial restrictions in left trapezius and scapularis region.                OT Treatments/Exercises (OP) - 03/14/17 0928      Exercises   Exercises  Shoulder      Shoulder Exercises: Supine   Protraction  PROM;Strengthening;10 reps    Protraction Weight (lbs)  2    Horizontal ABduction  PROM;Strengthening;10 reps    Horizontal ABduction Weight (lbs)  1  External Rotation  PROM;Strengthening;10 reps    External Rotation Weight (lbs)  1    Internal Rotation  PROM;Strengthening;10 reps    Internal Rotation Weight (lbs)  1    Flexion  PROM;Strengthening;10 reps    Shoulder Flexion Weight (lbs)  1    ABduction  PROM;AROM;Strengthening;10 reps    Shoulder ABduction Weight (lbs)  1 right only      Shoulder Exercises: Seated   Protraction  Strengthening;10 reps    Protraction Weight (lbs)  1    Horizontal ABduction  AROM;10 reps    External Rotation  Strengthening;10 reps    External Rotation Weight (lbs)  1    Internal Rotation  Strengthening;10 reps    Internal Rotation Weight (lbs)  1    Flexion  AROM;Strengthening;10 reps    Flexion Weight (lbs)  1 RUE    Abduction  AROM;10 reps      Manual Therapy   Manual Therapy  Myofascial release    Manual therapy comments  Manual therapy completed prior to exercises.     Myofascial Release  Myofascial release and manual stretching completed to left upper arm,  trapezius, and scapularis region to decrease fascial restrictions and increase joint mobility in a pain free zone.              OT Education - 03/14/17 1018    Education provided  Yes    Education Details  Discussed using her left arm for all daily tasks and to continue using it as normal as possible to increase strength.     Person(s) Educated  Patient    Methods  Explanation    Comprehension  Verbalized understanding       OT Short Term Goals - 03/14/17 0930      OT SHORT TERM GOAL #1   Title  Pt will be educated and independent with HEP to increase functional performance and endurance during ADL tasks.    Time  4    Period  Weeks    Status  On-going      OT SHORT TERM GOAL #2   Title  Patient will increase her grip and pinch strength to be within average norms in order to complete gripping tasks such as opening containers and jars with less difficulty.     Time  4    Period  Weeks    Status  On-going      OT SHORT TERM GOAL #3   Title  Patient will increase her BUE strength to 5/5 to increase functional performance during daily tasks.     Time  4    Period  Weeks    Status  On-going      OT SHORT TERM GOAL #4   Title  Patient will decrease fascial restrictions to min amount to increase functional mobility and comfort level when completing functional daily tasks.     Time  4    Period  Weeks    Status  New    Target Date  04/10/17               Plan - 03/14/17 0939    Clinical Impression Statement  A: Initiated myofascial release, manual stretching, and began strengthening for BUE. Fascial restrictions assessed and goal made to address. VC for form and technique. Pt has increased difficulty completing all exercises with 1# on left side. She has decreased scapular and shoulder strength and stability once pinpointed. Pt reports increased mobility and comfort level of left shoulder  at end of session.    Plan  P: Continue to focus on shoulder strength and  stability. Pt to bring her HEP from rehab to her PT and speech appointment on Monday. Update HEP if needed (TBD). Add proximal shoulder strengthening supine.       Patient will benefit from skilled therapeutic intervention in order to improve the following deficits and impairments:  Pain, Impaired UE functional use, Increased fascial restrictions, Decreased strength  Visit Diagnosis: Other symptoms and signs involving the musculoskeletal system  Acute pain of left shoulder  G-Codes - 2017-04-08 0856    Functional Assessment Tool Used (Outpatient only)  FOTO score: 54/100 (46% impaired)    Functional Limitation  Other OT primary    Other OT Primary Current Status (W0981)  At least 40 percent but less than 60 percent impaired, limited or restricted    Other OT Primary Goal Status (X9147)  At least 20 percent but less than 40 percent impaired, limited or restricted       Problem List Patient Active Problem List   Diagnosis Date Noted  . Arthrosis of left acromioclavicular joint   . Rotator cuff syndrome of left shoulder   . Primary osteoarthritis of left shoulder   . Surgical aftercare, musculoskeletal system 10/11/2014  . Status post total left knee replacement 10/11/2014  . Arthritis of left knee 09/14/2014  . Knee contusion 12/27/2013  . Medial meniscus, posterior horn derangement 12/27/2013  . Arthritis of knee, left 12/27/2013  . Left knee pain 12/27/2013  . 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 KNEE PAIN 05/11/2007  . DISC DEGENERATION 05/11/2007  . SCIATICA 05/11/2007   Limmie Patricia, OTR/L,CBIS  [phone]  04/08/17, 10:20 AM  [facility] [address] [place], [place], [address] Phone: [phone]   Fax:  [phone]  Name: Alyssa Poole MRN: [id number] Date of Birth: 07-Dec-1960

## 2017-03-14 NOTE — Therapy (Signed)
[facility] [address] [place], [name], [address] Phone: [phone]   Fax:  [phone]  Occupational Therapy Evaluation  Patient Details  Name: Alyssa RuskBobbie W Poole MRN: [id number] Date of Birth: 1961-03-21 No Data Recorded  Encounter Date: 03/13/2017  OT End of Session - 03/13/17 0844    Visit Number  1    Number of Visits  8    Date for OT Re-Evaluation  04/12/17    Authorization Type  1) Medicare 2) Medicaid    Authorization Time Period  before 10th visit    Authorization - Visit Number  1    Authorization - Number of Visits  10    OT Start Time  1655    OT Stop Time  1730    OT Time Calculation (min)  35 min    Activity Tolerance  Patient tolerated treatment well    Behavior During Therapy  [facility] for tasks assessed/performed       Past Medical History:  Diagnosis Date  . Anxiety   . Arthritis   . Asthma   . Chronic headaches   . Chronic neck pain   . Depression 2002  . Dysconjugate gaze   . GERD (gastroesophageal reflux disease)   . Hypertension   . Neuromuscular disorder (HCC)   . Osteoporosis   . Panic attacks 2002    Past Surgical History:  Procedure Laterality Date  . BACK SURGERY     x4  . CERVICAL SPINE SURGERY     plate in neck  . CHOLECYSTECTOMY    . KNEE ARTHROSCOPY WITH MEDIAL MENISECTOMY Left 03/09/2014   Procedure: LEFT KNEE ARTHROSCOPY WITH PARTIAL MEDIAL MENISECTOMY;  Surgeon: Vickki HearingStanley E Harrison, MD;  Location: [facility];  Service: Orthopedics;  Laterality: Left;  . SHOULDER ARTHROSCOPY Left 11/14/2016   Procedure: ARTHROSCOPY SHOULDER WITH LIMITED DEDBRIDEMENT AND ACROMIOPLASTY AND DISTAL CLAVICAL EXCISION;  Surgeon: Vickki HearingHarrison, Stanley E, MD;  Location: [facility];  Service: Orthopedics;  Laterality: Left;  . TOTAL KNEE ARTHROPLASTY Left 09/14/2014   Procedure: LEFT TOTAL KNEE ARTHROPLASTY;  Surgeon: Vickki HearingStanley E Harrison, MD;  Location: [facility];  Service: Orthopedics;  Laterality: Left;    There were no vitals  filed for this visit.  Subjective Assessment - 03/13/17 0823    Subjective   S: I feel a little weaker overall.     Patient is accompained by:  Family member boyfriend    Pertinent History  Patient is a 56 y/o female S/P SAH which occurred on 01/29/17. Patient was admitted to inpatient rehab for approximately 1 month. Dr. Cephus Poole has referred patient to occupational therapy for evaluation and treatment.     Patient Stated Goals  To get stronger.     Currently in Pain?  Yes    Pain Score  4     Pain Location  Shoulder    Pain Orientation  Left    Pain Type  Acute pain    Pain Radiating Towards  N/A    Pain Onset  More than a month ago    Pain Frequency  Intermittent    Aggravating Factors   movement and use    Pain Relieving Factors  rest    Effect of Pain on Daily Activities  mod-max effect     Multiple Pain Sites  No        [facility] OT Assessment - 03/13/17 1701      Assessment   Medical Diagnosis  BUE weakness     Referring  Provider  Renato GailsJohn Aguilar, MD    Onset Date/Surgical Date  01/29/17    Next MD Visit  -- approximately 2 weeks    Prior Therapy  [facility]      Precautions   Precautions  Other (comment) Low vison - left eye blind      Restrictions   Weight Bearing Restrictions  No      Balance Screen   Has the patient fallen in the past 6 months  No    Has the patient had a decrease in activity level because of a fear of falling?   No    Is the patient reluctant to leave their home because of a fear of falling?   No      Home  Environment   Family/patient expects to be discharged to:  Private residence    Living Arrangements  Spouse/significant other son      Prior Function   Level of Independence  Needs assistance with ADLs    Vocation  On disability    Comments  Pt reports that she receives assistance with dressing due to hx of left shoulder arthroscopy performed in August 2018.       ADL   ADL comments  Pt reports that she is  completing all daily tasks at baseline without any increased difficulty.       Written Expression   Dominant Hand  Right      Vision - History   Baseline Vision  Wears glasses all the time    Patient Visual Report  -- blind left eye since birth      Cognition   Overall Cognitive Status  Impaired/Different from baseline    [facility]  [facility]      Coordination   9 Hole Peg Test  Left;Right    Right 9 Hole Peg Test  28.1"    Left 9 Hole Peg Test  33.9"      ROM / Strength   AROM / PROM / Strength  Strength;AROM      AROM   Overall AROM   Within functional limits for tasks performed    Overall AROM Comments  BUE all ranges.      Strength   Strength Assessment Site  Shoulder;Hand    Right/Left Shoulder  Right;Left    Right Shoulder Flexion  4/5    Right Shoulder ABduction  4/5    Right Shoulder Internal Rotation  4/5    Right Shoulder External Rotation  4/5    Left Shoulder Flexion  4-/5    Left Shoulder ABduction  4-/5    Left Shoulder Internal Rotation  4+/5    Left Shoulder External Rotation  4+/5    Right/Left hand  Right;Left    Right Hand Grip (lbs)  41    Right Hand Lateral Pinch  10 lbs    Right Hand 3 Point Pinch  11 lbs WNL    Left Hand Grip (lbs)  27    Left Hand Lateral Pinch  12 lbs WNL    Left Hand 3 Point Pinch  8 lbs                      OT Education - 03/13/17 0841    Education provided  Yes    Education Details  red theraputty with exercises    Person(s) Educated  Patient;Other (comment) Boyfriend   Boyfriend   Methods  Explanation;Demonstration;Verbal cues;Handout  Comprehension  Verbalized understanding       OT Short Term Goals - 03/13/17 0851      OT SHORT TERM GOAL #1   Title  Pt will be educated and independent with HEP to increase functional performance and endurance during ADL tasks.    Time  4    Period  Weeks    Status  New    Target Date  04/10/17      OT SHORT TERM GOAL #2   Title  Patient will increase her grip and  pinch strength to be within average norms in order to complete gripping tasks such as opening containers and jars with less difficulty.     Time  4    Period  Weeks    Status  New      OT SHORT TERM GOAL #3   Title  Patient will increase her BUE strength to 5/5 to increase functional performance during daily tasks.     Time  4    Period  Weeks    Status  New               Plan - Mar 24, 2017 0848    Clinical Impression Statement  A: Pt is a 56 y/o female S/P SAH causing decreased weakness in BUE resulting in decreased endurance when required to complete ADL tasks.     Occupational Profile and client history currently impacting functional performance  motiviated to return to prior level of function, strong social support at home.     Occupational performance deficits (Please refer to evaluation for details):  Leisure    Rehab Potential  Excellent    Current Impairments/barriers affecting progress:  Hx of left shoulder arthroscopy 11/14/16    OT Frequency  2x / week    OT Duration  4 weeks    OT Treatment/Interventions  Self-care/ADL training;Ultrasound;DME and/or AE instruction;Patient/family education;Therapeutic exercise;Manual Therapy;Therapeutic activities;Electrical Stimulation;Cryotherapy;Passive range of motion    Plan  P: Pt will benefit from skilled OT services to increase functional performance during daily tasks using BUEs. Treatment Plan: BUE strengthening and stability, grip and pinch strength.    Clinical Decision Making  Limited treatment options, no task modification necessary    Consulted and Agree with Plan of Care  Patient       Patient will benefit from skilled therapeutic intervention in order to improve the following deficits and impairments:  Pain, Impaired UE functional use, Increased fascial restrictions, Decreased strength  Visit Diagnosis: Other symptoms and signs involving the musculoskeletal system - Plan: Ot plan of care cert/re-cert  Acute pain of  left shoulder - Plan: Ot plan of care cert/re-cert  G-Codes - March 24, 2017 0856    Functional Assessment Tool Used (Outpatient only)  FOTO score: 54/100 (46% impaired)    Functional Limitation  Other OT primary    Other OT Primary Current Status (Z6109)  At least 40 percent but less than 60 percent impaired, limited or restricted    Other OT Primary Goal Status (U0454)  At least 20 percent but less than 40 percent impaired, limited or restricted       Problem List Patient Active Problem List   Diagnosis Date Noted  . Arthrosis of left acromioclavicular joint   . Rotator cuff syndrome of left shoulder   . Primary osteoarthritis of left shoulder   . Surgical aftercare, musculoskeletal system 10/11/2014  . Status post total left knee replacement 10/11/2014  . Arthritis of left knee 09/14/2014  . Knee contusion 12/27/2013  .  Medial meniscus, posterior horn derangement 12/27/2013  . Arthritis of knee, left 12/27/2013  . Left knee pain 12/27/2013  . 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 KNEE PAIN 05/11/2007  . DISC DEGENERATION 05/11/2007  . SCIATICA 05/11/2007   Limmie Patricia, OTR/L,CBIS  [phone]  03/14/2017, 8:59 AM  [place] [facility] [address] [place], [place], [address] Phone: [phone]   Fax:  [phone]  Name: SABINA BEAVERS MRN: [id number] Date of Birth: 07/26/1960

## 2017-03-14 NOTE — Therapy (Signed)
[place] [facility] [address] [place], [name], [address] Phone: [phone]   Fax:  [phone]  Physical Therapy Evaluation  Patient Details  Name: Alyssa RuskBobbie W Poole MRN: [id number] Date of Birth: 03-20-1961 Referring Provider: Renato GailsAguilar, John MD   Encounter Date: 03/13/2017  PT End of Session - 03/13/17 1842    Visit Number  1    Number of Visits  9    Date for PT Re-Evaluation  04/10/17    Authorization Type  Medicare part A and Medicaid [place]    Authorization Time Period  03/13/17 - 04/19/17    Authorization - Visit Number  1    Authorization - Number of Visits  10    PT Start Time  1733    PT Stop Time  1820    PT Time Calculation (min)  47 min    Equipment Utilized During Treatment  Other (comment) Cane    Activity Tolerance  Patient tolerated treatment well;No increased pain;Patient limited by pain    Behavior During Therapy  [facility] tasks assessed/performed       Past Medical History:  Diagnosis Date  . Anxiety   . Arthritis   . Asthma   . Chronic headaches   . Chronic neck pain   . Depression 2002  . Dysconjugate gaze   . GERD (gastroesophageal reflux disease)   . Hypertension   . Neuromuscular disorder (HCC)   . Osteoporosis   . Panic attacks 2002    Past Surgical History:  Procedure Laterality Date  . BACK SURGERY     x4  . CERVICAL SPINE SURGERY     plate in neck  . CHOLECYSTECTOMY    . KNEE ARTHROSCOPY WITH MEDIAL MENISECTOMY Left 03/09/2014   Procedure: LEFT KNEE ARTHROSCOPY WITH PARTIAL MEDIAL MENISECTOMY;  Surgeon: Vickki HearingStanley E Harrison, MD;  Location: [facility];  Service: Orthopedics;  Laterality: Left;  . SHOULDER ARTHROSCOPY Left 11/14/2016   Procedure: ARTHROSCOPY SHOULDER WITH LIMITED DEDBRIDEMENT AND ACROMIOPLASTY AND DISTAL CLAVICAL EXCISION;  Surgeon: Vickki HearingHarrison, Stanley E, MD;  Location: [facility];  Service: Orthopedics;  Laterality: Left;  . TOTAL KNEE ARTHROPLASTY Left 09/14/2014   Procedure: LEFT TOTAL  KNEE ARTHROPLASTY;  Surgeon: Vickki HearingStanley E Harrison, MD;  Location: [facility];  Service: Orthopedics;  Laterality: Left;    There were no vitals filed for this visit.   Subjective Assessment - 03/13/17 1733    Subjective  Patient states that she had a stroke 01/29/17. Patient states that she can't walk as far as she could before. She states she is having trouble with balance. Patient reports that  she is having some pain in her right groin since her stroke. Patient states that she is able to walk short distances, but that she doesn't feel she can walk very far. Patient states that she has not had any trouble with her sensation since her stroke. Patient states that she gets dizzy sometimes when turning over her shoulder.     Patient is accompained by:  Family member    Pertinent History  hypertension, s/p SAH, osteoporosis, patient is blind in left eye    Limitations  Walking;Standing    How long can you sit comfortably?  Not limited    How long can you stand comfortably?  3-4 minutes    How long can you walk comfortably?  3-4 minutes    Diagnostic tests  Diagnostic tests showed a large amount of acute subarachnoid hemorrhage in the basilar cisterns concerning for ruptured  aneurysm with small amount of hemorrhage in the fourth ventricle and trace hemorrhage dependently in the occipital horn of the lateral ventricles. Also milde hydrocephalus.     Patient Stated Goals  Patient states she would like to focus on balance and decrease pain in groin    Currently in Pain?  Yes    Pain Score  10-Worst pain ever    Pain Location  Groin    Pain Orientation  Right    Pain Descriptors / Indicators  Sharp    Pain Type  Acute pain    Pain Onset  More than a month ago    Pain Frequency  Intermittent    Aggravating Factors   With hip abduction    Pain Relieving Factors  Bringing legs together; pain medicine    Multiple Pain Sites  No         [facility] PT Assessment - 03/13/17 1725      Assessment   Medical  Diagnosis  Post-stroke SAH impaired mobility and ADLs cognitive deficits post stroke    Referring Provider  Renato Gails MD    Onset Date/Surgical Date  03/13/17      Balance Screen   Has the patient fallen in the past 6 months  No    Has the patient had a decrease in activity level because of a fear of falling?   No    Is the patient reluctant to leave their home because of a fear of falling?   No      Home Environment   Additional Comments  Patient states she does not have any stairs at home      Observation/Other Assessments   Focus on Therapeutic Outcomes (FOTO)   46% limited      Sensation   Light Touch  Appears Intact      Coordination   Finger Nose Finger Test  Patient can only perform this with right upper extremity due to ROM in left shoulder; patient can perform at a slow pace    Heel Shin Test  Patient can only perform this with left lower extremity due to pain in right groin area; patient performs at a slow rate      AROM   Overall AROM   Within functional limits for tasks performed For lower extremity joints except hip abduction on the right    Overall AROM Comments  Hip abduction limited on the right side    Right/Left Hip  Right    Right/Left Knee  Right;Left    Right/Left Ankle  Right;Left      Strength   Right/Left Hip  Right;Left    Right Hip Flexion  3+/5 painful    Right Hip ABduction  5/5    Right Hip ADduction  3+/5 Painful    Left Hip Flexion  4+/5    Left Hip ABduction  5/5    Left Hip ADduction  5/5    Right/Left Knee  Right;Left    Right Knee Flexion  5/5    Right Knee Extension  5/5    Left Knee Flexion  5/5    Left Knee Extension  5/5    Right/Left Ankle  Right;Left    Right Ankle Dorsiflexion  5/5    Right Ankle Plantar Flexion  5/5    Right Ankle Inversion  5/5    Right Ankle Eversion  5/5    Left Ankle Dorsiflexion  5/5    Left Ankle Plantar Flexion  5/5    Left Ankle  Inversion  5/5    Left Ankle Eversion  5/5      Transfers    Comments  Patient performs sit to stands with hesitation      Ambulation/Gait   Ambulation Distance (Feet)  65 Feet Patient appeared that she could walk further, assess further    Assistive device  Straight cane    Gait Comments  Patient ambulates using a single point cane and demonstrates trendelenburg gait and decreased stride length bilaterally      Static Sitting Balance   Static Sitting - Balance Support  Feet unsupported    Static Sitting - Level of Assistance  7: Independent    Static Sitting - Comment/# of Minutes  2 minutes; does not seem limited      Static Standing Balance   Static Standing - Balance Support  No upper extremity supported    Static Standing - Level of Assistance  7: Independent    Static Standing Balance -  Activities   Single Leg Stance - Right Leg;Single Leg Stance - Left Leg;Tandam Stance - Right Leg;Tandam Stance - Left Leg tandem: 3 seconds bilaterally; SLS: 0 seconds      Berg Balance Test   Sit to Stand  Needs minimal aid to stand or to stabilize    Standing Unsupported  Able to stand safely 2 minutes    Sitting with Back Unsupported but Feet Supported on Floor or Stool  Able to sit safely and securely 2 minutes    Stand to Sit  Sits safely with minimal use of hands    Transfers  Able to transfer safely, minor use of hands    Standing Unsupported with Eyes Closed  Able to stand 10 seconds safely    Standing Ubsupported with Feet Together  Able to place feet together independently and stand for 1 minute with supervision    From Standing, Reach Forward with Outstretched Arm  Can reach forward >12 cm safely (5")    From Standing Position, Pick up Object from Floor  Able to pick up shoe, needs supervision    From Standing Position, Turn to Look Behind Over each Shoulder  Looks behind one side only/other side shows less weight shift    Turn 360 Degrees  Able to turn 360 degrees safely in 4 seconds or less    Standing Unsupported, Alternately Place Feet on  Step/Stool  Able to complete >2 steps/needs minimal assist    Standing Unsupported, One Foot in [employer] balance while stepping or standing    Standing on One Leg  Unable to try or needs assist to prevent fall    Total Score  38             Objective measurements completed on examination: See above findings.              PT Education - 03/13/17 1841    Education provided  Yes    Education Details  Patient was educated on examination findings and POC as well as about what patient's balance abilities are currently.      Person(s) Educated  Patient;Other (comment) Boyfriend    Methods  Explanation    Comprehension  Verbalized understanding       PT Short Term Goals - 03/13/17 1859      PT SHORT TERM GOAL #1   Title  Patient and caregiver will report understanding and regular compliance with HEP.     Baseline  Educated about plans for therapy  Time  2    Period  Weeks    Status  New    Target Date  03/27/17      PT SHORT TERM GOAL #2   Title  Patient will demonstrate ability to perform single limb stance independently for 2 seconds bilaterally in order to help with stair ambulation.     Baseline  Patient is unable to perform single limb stance without assistance.     Time  2    Period  Weeks    Status  New    Target Date  03/27/17      PT SHORT TERM GOAL #3   Title  Patient will demonstrate improvement on Berg balance test of 5 points in order to improve balance and reduce risk of falls.     Baseline  Berg score: 38    Time  2    Period  Weeks    Status  New    Target Date  03/27/17        PT Long Term Goals - 03/13/17 1905      PT LONG TERM GOAL #1   Title  Patient will demonstrate improvement in berg balance test of 8 points indicating improved balance and decreased risk of falls.     Baseline  Berg score: 38    Time  4    Period  Weeks    Status  New    Target Date  04/10/17      PT LONG TERM GOAL #2   Title  Patient will demonstrate  ability to perform single limb stance for 5 seconds independently bilaterally in order to assist with navigation in the environment and with stair navigation.     Baseline  Patient is unable to perform single limb stance without assistance    Time  4    Period  Weeks    Status  New    Target Date  04/10/17      PT LONG TERM GOAL #3   Title  Patient will demonstrate improved gait mechanics with decreased hip drop and increased stride length with 50% of gait in order to improve functional mobility.     Baseline  Patient demonstrates hip drop and decreased stride length 100% of time with gait.     Time  4    Period  Weeks    Status  New    Target Date  04/10/17      PT LONG TERM GOAL #4   Title  Patient's FOTO score will improve by 10% indicating improvement in functional mobility.     Baseline  FOTO score: 46% limited    Time  4    Period  Weeks    Status  New    Target Date  04/11/17             Plan - 03/13/17 1846    Clinical Impression Statement  Patient is a pleasant 56 year old woman who presents to physical therapy s/p SAH on 01/29/17. Patient demonstrated decreased strength in bilateral hip flexors and with right hip adduction. Patient's lower extremity range of motion is limited with right hip abduction due to pain in the groin. With sensation testing, patient does not seem impaired. Patient's balance is limited with a score on the Berg balance test of 38 indicating a predicted fall probability of 50%. Patient has difficulty with static standing balance in tandem stance and can only maintain balance for 3 seconds bilaterally. Patient is unable to perform  single leg balance for any length of time during the testing. Patient has difficulty with tapping the step during the berg balance test. Patient demonstrates a trendelenberg gait with decreased stride length bilaterally. Patient would benefit from skilled physical therapy in order to address the abovementioned deficits in order  to improve functional mobility.     History and Personal Factors relevant to plan of care:  S/p SAH; hypertension; osteoporosis; rotator cuff tear in left shoulder    Clinical Presentation  Stable    Clinical Presentation due to:  (+)support system, positive attitude, motivated (-) medical history and preexisting conditions    Clinical Decision Making  Low    Rehab Potential  Good    Clinical Impairments Affecting Rehab Potential  pain; strength limitations in lower extremities; balance difficulties    PT Frequency  2x / week    PT Duration  4 weeks    PT Treatment/Interventions  ADLs/Self Care Home Management;DME Instruction;Gait training;Stair training;Functional mobility training;Therapeutic activities;Therapeutic exercise;Balance training;Neuromuscular re-education;Patient/family education;Manual techniques;Passive range of motion;Energy conservation;Cryotherapy;Electrical Stimulation;Moist Heat;Ultrasound    PT Next Visit Plan  Create HEP; review evaluation; dynamic balance; assess gait endurance further    Consulted and Agree with Plan of Care  Patient       Patient will benefit from skilled therapeutic intervention in order to improve the following deficits and impairments:  Abnormal gait, Decreased activity tolerance, Decreased cognition, Decreased endurance, Decreased range of motion, Decreased strength, Increased fascial restricitons, Improper body mechanics, Pain, Decreased balance, Decreased coordination, Decreased mobility, Decreased safety awareness, Difficulty walking, Postural dysfunction  Visit Diagnosis: Balance problems  Decreased strength of lower extremity  Other abnormalities of gait and mobility  G-Codes - 03/14/17 0757    Functional Assessment Tool Used (Outpatient Only)  FOTO and Berg Balance Test    Functional Limitation  Mobility: Walking and moving around    Mobility: Walking and Moving Around Current Status [phone]([id number])  At least 40 percent but less than 60 percent  impaired, limited or restricted    Mobility: Walking and Moving Around Goal Status [phone]([id number])  At least 20 percent but less than 40 percent impaired, limited or restricted        Problem List Patient Active Problem List   Diagnosis Date Noted  . Arthrosis of left acromioclavicular joint   . Rotator cuff syndrome of left shoulder   . Primary osteoarthritis of left shoulder   . Surgical aftercare, musculoskeletal system 10/11/2014  . Status post total left knee replacement 10/11/2014  . Arthritis of left knee 09/14/2014  . Knee contusion 12/27/2013  . Medial meniscus, posterior horn derangement 12/27/2013  . Arthritis of knee, left 12/27/2013  . Left knee pain 12/27/2013  . 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 KNEE PAIN 05/11/2007  . DISC DEGENERATION 05/11/2007  . SCIATICA 05/11/2007   Verne CarrowMacy Tyneisha Hegeman PT, DPT 8:07 AM, 03/14/17 [phone]  [facility] [address] [place], [name], [address] Phone: [phone]   Fax:  [phone]  Name: Alyssa RuskBobbie W Poole MRN: [id number] Date of Birth: 08-04-1960

## 2017-03-17 ENCOUNTER — Other Ambulatory Visit: Payer: Self-pay

## 2017-03-17 ENCOUNTER — Ambulatory Visit (HOSPITAL_COMMUNITY): Payer: Medicare Other | Admitting: Physical Therapy

## 2017-03-17 ENCOUNTER — Encounter (HOSPITAL_COMMUNITY): Payer: Self-pay | Admitting: Physical Therapy

## 2017-03-17 ENCOUNTER — Encounter (HOSPITAL_COMMUNITY): Payer: Self-pay | Admitting: Speech Pathology

## 2017-03-17 ENCOUNTER — Ambulatory Visit (HOSPITAL_COMMUNITY): Payer: Medicare Other | Admitting: Speech Pathology

## 2017-03-17 DIAGNOSIS — R29898 Other symptoms and signs involving the musculoskeletal system: Secondary | ICD-10-CM

## 2017-03-17 DIAGNOSIS — R41841 Cognitive communication deficit: Secondary | ICD-10-CM

## 2017-03-17 DIAGNOSIS — R2689 Other abnormalities of gait and mobility: Secondary | ICD-10-CM

## 2017-03-17 NOTE — Patient Instructions (Signed)
Balance: Unilateral   At the kitchen counter Attempt to balance on left leg, eyes open. Hold __15-20__ seconds. Repeat _3___ times per set. Do _1___ sets per session. Do __2__ sessions per day. Perform exercise with eyes closed.  http://orth.exer.us/28   Copyright  VHI. All rights reserved.  Heel Raise: Bilateral (Standing)   At the kitchen counter Rise on balls of feet. Repeat _10___ times per set. Do _1___ sets per session. Do ___2_ sessions per day.  http://orth.exer.us/38   Copyright  VHI. All rights reserved.  Feet Heel-Toe "Tandem" (Compliant Surface) Arm Motion - Eyes Open    With eyes open, standing on the floor. ________, right foot directly in front of the other, move arms up and down: to front. Repeat _5-10___ times per session. Do _2___ sessions per day.  Copyright  VHI. All rights reserved.  Functional Quadriceps: Sit to Stand    Sit on edge of chair, feet flat on floor. Stand upright, extending knees fully. Repeat _10___ times per set. Do _1___ sets per session. Do ___2_ sessions per day.  http://orth.exer.us/734   Copyright  VHI. All rights reserved.

## 2017-03-17 NOTE — Therapy (Signed)
[name], [name], [address] Phone: [phone]   Fax:  [phone]  Physical Therapy Treatment  Patient Details  Name: Alyssa Poole MRN: [id number] Date of Birth: 1960/09/07 Referring Provider: Renato GailsAguilar, John MD   Encounter Date: 03/17/2017  PT End of Session - 03/17/17 1604    Visit Number  2    Number of Visits  9    Date for PT Re-Evaluation  04/10/17    Authorization Type  Medicare part A and Medicaid [place] A    Authorization Time Period  03/13/17 - 04/19/17    Authorization - Visit Number  2    Authorization - Number of Visits  10    PT Start Time  1525    PT Stop Time  1603    PT Time Calculation (min)  38 min    Equipment Utilized During Treatment  Other (comment) Cane    Activity Tolerance  Patient tolerated treatment well;No increased pain;Patient limited by pain    Behavior During Therapy  [facility] for tasks assessed/performed       Past Medical History:  Diagnosis Date  . Anxiety   . Arthritis   . Asthma   . Chronic headaches   . Chronic neck pain   . Depression 2002  . Dysconjugate gaze   . GERD (gastroesophageal reflux disease)   . Hypertension   . Neuromuscular disorder (HCC)   . Osteoporosis   . Panic attacks 2002    Past Surgical History:  Procedure Laterality Date  . BACK SURGERY     x4  . CERVICAL SPINE SURGERY     plate in neck  . CHOLECYSTECTOMY    . KNEE ARTHROSCOPY WITH MEDIAL MENISECTOMY Left 03/09/2014   Procedure: LEFT KNEE ARTHROSCOPY WITH PARTIAL MEDIAL MENISECTOMY;  Surgeon: Vickki HearingStanley E Harrison, MD;  Location: [facility];  Service: Orthopedics;  Laterality: Left;  . SHOULDER ARTHROSCOPY Left 11/14/2016   Procedure: ARTHROSCOPY SHOULDER WITH LIMITED DEDBRIDEMENT AND ACROMIOPLASTY AND DISTAL CLAVICAL EXCISION;  Surgeon: Vickki HearingHarrison, Stanley E, MD;  Location: [facility];  Service: Orthopedics;  Laterality: Left;  . TOTAL KNEE ARTHROPLASTY Left 09/14/2014   Procedure: LEFT TOTAL  KNEE ARTHROPLASTY;  Surgeon: Vickki HearingStanley E Harrison, MD;  Location: [facility];  Service: Orthopedics;  Laterality: Left;    There were no vitals filed for this visit.  Subjective Assessment - 03/17/17 1548    Subjective  Pt states that her balance is off.  She has back pain but this is normal for her.      Patient is accompained by:  Family member    Pertinent History  hypertension, s/p SAH, osteoporosis, patient is blind in left eye    Limitations  Walking;Standing    How long can you sit comfortably?  Not limited    How long can you stand comfortably?  3-4 minutes    How long can you walk comfortably?  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 Also milde hydrocephalus.     Patient Stated Goals  Patient states she would like to focus on balance and decrease pain in groin    Currently in Pain?  Yes    Pain Score  5     Pain Location  Back  Pain Orientation  Lower    Pain Descriptors / Indicators  Aching    Pain Type  Chronic pain    Pain Onset  More than a month ago    Aggravating Factors   standing and sitting     Pain Relieving Factors  pain medication                           Balance Exercises - 03/17/17 1550      Balance Exercises: Standing   Standing Eyes Opened  Narrow base of support (BOS);Head turns;Foam/compliant surface;5 reps;Other reps (comment) 5 head nods    Tandem Stance  Eyes open;Foam/compliant surface;5 reps;Limitations    SLS  Eyes open;5 reps    Rockerboard  Anterior/posterior;Lateral;10 reps    Marching Limitations  10x    Heel Raises Limitations  10x    Sit to Stand Time  pick ball off floor stand reach up to ceiling and sit.     Other Standing Exercises  lunging Rt and left x 10 each ;   sitting on wobble disc and reaching rt/lt; reach rigth/ left stand up sit down x 5         PT Education - 03/17/17 1604    Education provided  Yes    Education Details  HEP    Person(s) Educated  Patient    Methods  Explanation    Comprehension  Verbalized understanding       PT Short Term Goals - 03/17/17 1607      PT SHORT TERM GOAL #1   Title  Patient and caregiver will report understanding and regular compliance with HEP.     Baseline  Educated about plans for therapy    Time  2    Period  Weeks    Status  Achieved      PT SHORT TERM GOAL #2   Title  Patient will demonstrate ability to perform single limb stance independently for 2 seconds bilaterally in order to help with stair ambulation.     Baseline  Patient is unable to perform single limb stance without assistance.     Time  2    Period  Weeks    Status  Achieved      PT SHORT TERM GOAL #3   Title  Patient will demonstrate improvement on Berg balance test of 5 points in order to improve balance and reduce risk of falls.     Baseline  Berg score: 38    Time  2    Period  Weeks    Status  On-going        PT Long Term Goals - 03/17/17 1608      PT LONG TERM GOAL #1   Title  Patient will demonstrate improvement in berg balance test of 8 points indicating improved balance and decreased risk of falls.     Baseline  Berg score: 38    Time  4    Period  Weeks    Status  On-going      PT LONG TERM GOAL #2   Title  Patient will demonstrate ability to perform single limb stance for 5 seconds independently bilaterally in order to assist with navigation in the environment and with stair navigation.     Baseline  able to stand x 10 seconds B today will change to 30 seconds     Time  4    Period  Weeks    Status  Revised      PT LONG TERM GOAL #3   Title  Patient will demonstrate improved gait mechanics with decreased hip drop and increased stride length with 50% of gait in order to improve functional mobility.      Baseline  Patient demonstrates hip drop and decreased stride length 100% of time with gait.     Time  4    Period  Weeks    Status  On-going      PT LONG TERM GOAL #4   Title  Patient's FOTO score will improve by 10% indicating improvement in functional mobility.     Baseline  FOTO score: 46% limited    Time  4    Period  Weeks    Status  On-going            Plan - 03/17/17 1605    Clinical Impression Statement  Evaluation and goals reviewed with patient.  Therapist initiated balance activity and issues HEP.  Pt tends to loose balance to the right with minimal tactile cuing needed to recover.     Rehab Potential  Good    Clinical Impairments Affecting Rehab Potential  pain; strength limitations in lower extremities; balance difficulties    PT Frequency  2x / week    PT Duration  4 weeks    PT Treatment/Interventions  ADLs/Self Care Home Management;DME Instruction;Gait training;Stair training;Functional mobility training;Therapeutic activities;Therapeutic exercise;Balance training;Neuromuscular re-education;Patient/family education;Manual techniques;Passive range of motion;Energy conservation;Cryotherapy;Electrical Stimulation;Moist Heat;Ultrasound    PT Next Visit Plan  begin walking with scarf catch, opposite arm/leg raise and balance beam next visit.  Update HEP as needed     Consulted and Agree with Plan of Care  Patient       Patient will benefit from skilled therapeutic intervention in order to improve the following deficits and impairments:  Abnormal gait, Decreased activity tolerance, Decreased cognition, Decreased endurance, Decreased range of motion, Decreased strength, Increased fascial restricitons, Improper body mechanics, Pain, Decreased balance, Decreased coordination, Decreased mobility, Decreased safety awareness, Difficulty walking, Postural dysfunction  Visit Diagnosis: Decreased strength of lower extremity  Other abnormalities of gait and mobility  Balance  problems     Problem List Patient Active Problem List   Diagnosis Date Noted  . Arthrosis of left acromioclavicular joint   . Rotator cuff syndrome of left shoulder   . Primary osteoarthritis of left shoulder   . Surgical aftercare, musculoskeletal system 10/11/2014  . Status post total left knee replacement 10/11/2014  . Arthritis of left knee 09/14/2014  . Knee contusion 12/27/2013  . Medial meniscus, posterior horn derangement 12/27/2013  . Arthritis of knee, left 12/27/2013  . Left knee pain 12/27/2013  . 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 KNEE PAIN 05/11/2007  . DISC DEGENERATION 05/11/2007  . SCIATICA 05/11/2007   Virgina Organynthia Russell, PT CLT [phone] 03/17/2017, 4:09 PM  [facility] [address] [place], [name], [address] Phone: [phone]   Fax:  [phone]  Name: Alyssa Poole MRN: [id number] Date of Birth: [birth year]

## 2017-03-17 NOTE — Therapy (Signed)
[facility] [address] [place], [name], [address] Phone: [phone]   Fax:  [phone]  Speech Language Pathology Treatment  Patient Details  Name: Alyssa Poole MRN: [id number] Date of Birth: 01-Oct-1960 Referring Provider: Renato GailsJohn Aguilar, MD   Encounter Date: 03/17/2017  End of Session - 03/17/17 1704    Visit Number  2    Number of Visits  9    Date for SLP Re-Evaluation  04/15/17    Authorization Type  Medicare    Authorization Time Period  before 10th visit    SLP Start Time  1438    SLP Stop Time   1522    SLP Time Calculation (min)  44 min    Activity Tolerance  Patient tolerated treatment well       Past Medical History:  Diagnosis Date  . Anxiety   . Arthritis   . Asthma   . Chronic headaches   . Chronic neck pain   . Depression 2002  . Dysconjugate gaze   . GERD (gastroesophageal reflux disease)   . Hypertension   . Neuromuscular disorder (HCC)   . Osteoporosis   . Panic attacks 2002    Past Surgical History:  Procedure Laterality Date  . BACK SURGERY     x4  . CERVICAL SPINE SURGERY     plate in neck  . CHOLECYSTECTOMY    . KNEE ARTHROSCOPY WITH MEDIAL MENISECTOMY Left 03/09/2014   Procedure: LEFT KNEE ARTHROSCOPY WITH PARTIAL MEDIAL MENISECTOMY;  Surgeon: Vickki HearingStanley E Harrison, MD;  Location: [facility];  Service: Orthopedics;  Laterality: Left;  . SHOULDER ARTHROSCOPY Left 11/14/2016   Procedure: ARTHROSCOPY SHOULDER WITH LIMITED DEDBRIDEMENT AND ACROMIOPLASTY AND DISTAL CLAVICAL EXCISION;  Surgeon: Vickki HearingHarrison, Stanley E, MD;  Location: [facility];  Service: Orthopedics;  Laterality: Left;  . TOTAL KNEE ARTHROPLASTY Left 09/14/2014   Procedure: LEFT TOTAL KNEE ARTHROPLASTY;  Surgeon: Vickki HearingStanley E Harrison, MD;  Location: [facility];  Service: Orthopedics;  Laterality: Left;    There were no vitals filed for this visit.  Subjective Assessment - 03/17/17 1657    Subjective  "I couldn't remember where the bathroom was  last night."    Patient is accompained by:  Family member    Currently in Pain?  No/denies        ADULT SLP TREATMENT - 03/17/17 0001      General Information   Behavior/Cognition  Alert;Cooperative;Pleasant mood    Patient Positioning  Upright in chair    Oral care provided  N/A    HPI  Alyssa Poole is a 56 y/o right hand dominant female with PMHx significant for left eye blindness, hypertension, anxiety, chronic headaches who [facility] from an outside hospital with subarachnoid hemorrhage.  CT at outside hospital revealed large amount of acute subarachnoid hemorrhage in the basilar cisterns with small amount of hemorrhage in the fourth ventricle and mild hydrocephalus.  Right EVD was placed by neurosurgery.  Patient underwent cerebral arteriogram by Dr. Thedore MinsSingh.  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  Dissection was treated with overlapping stents.  No definitive aneurysm or AVM was identified at that time.  In the ICU, the patient self extubated and partially removed her EVD.  The patient's ICU course was further complicated by fever.  Infectious workup was negative and the patient was treated empirically with vancomycin and cefepime initially.  Ultimately fever was attributed to neurogenic cause.  The patient's ICU course  was further complicated by agitation which was treated with Precedex, anemia which was treated with transfusion, hypokalemia, cerebral salt wasting, and repeated bolus requirements to maintain euvolemia.  The patient underwent repeat cerebral arteriogram on 02/05/2017.  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  There were no aneurysm or other vascular malformations to explain her subarachnoid hemorrhage at that time.  She was admitted to inpatient rehabilitation for comprehensive therapy and continued medical management on 02/19/17 and eventually  discharged home with family on 03/07/2017. Dr. Cephus Richer has referred Ms. Poole for outpatient SLP, OT, and PT.        Treatment Provided   Treatment provided  Cognitive-Linquistic      Pain Assessment   Pain Assessment  No/denies pain      Cognitive-Linquistic Treatment   Treatment focused on  Cognition;Patient/family/caregiver education;Aphasia    Skilled Treatment  memory strategies, visualization strategy related to describing home layout, providing written information      Assessment / Recommendations / Plan   Plan  Continue with current plan of care      Progression Toward Goals   Progression toward goals  Progressing toward goals       SLP Education - 03/17/17 1703    Education provided  Yes    Education Details  Provided folder for HEP (memory and word finding strategies given)    Person(s) Educated  Patient;Spouse    Methods  Explanation;Handout    Comprehension  Need further instruction       SLP Short Term Goals - 03/17/17 1705      SLP SHORT TERM GOAL #1   Title  Pt will complete functional memory activities (mod level) with 90% acc when given mild/mod cues and use of memory strategies.    Baseline  25% independently; 80% with mod/max cues    Time  4    Period  Weeks    Status  On-going      SLP SHORT TERM GOAL #2   Title  Pt will complete selective and alternating attention tasks (moderately complex) with 80% acc and min cues.    Baseline  50%    Time  4    Period  Weeks    Status  On-going      SLP SHORT TERM GOAL #3   Title  Pt will describe objects and pictures by providing at least three salient features as judged by clinician with 90% acc when provided mi/mod cues.     Baseline  mod assist    Time  4    Period  Weeks    Status  On-going      SLP SHORT TERM GOAL #4   Title  Pt will increase divergent naming to 5 items per concrete category with mod cues.    Baseline  2 items    Time  4    Period  Weeks    Status  On-going       SLP Long Term  Goals - 03/17/17 1705      SLP LONG TERM GOAL #1   Title  Pt will increase memory skills to [facility] with use of compensatory strategies as needed.     Baseline  min to mi/mod impairment    Time  2    Period  Months    Status  On-going      SLP LONG TERM GOAL #2   Title  Pt will increase verbal expression  skills to [facility] with use of strategies as needed.    Baseline  min to mi/mod impairment    Time  2    Period  Months    Status  On-going       Plan - 03/17/17 1704    Clinical Impression Statement Pt accompanied to therapy by her significant other this date. He had questions regarding her Amiodarone medication which stated for her to take 13 pills, but the bottle had 30 tablets to start out with. SLP encouraged Pt/spouse to call MD with questions, but did locate a confirmation in Dr. Garnetta BuddyAguilar's discharge summary which also stated to take 13 pills until 03/21/2017. Pt stated that prior to her stroke, she was independent in management. She hopes to regain that independence. SLP suggested that we create a medication template after finishing her amiodarone and utilize medication container. Memory strategies and wording finding strategies were introduced this date, but need further explanation next session. She was asked to visualize and describe the layout of her mobile home with assist from her husband in session. She required moderate cues for placement of major items in home (TV, sofa, kitchen, bathroom). She reported that she fell asleep in her recliner last night and when she got up, she could not remember the location of the bathroom. SLP also introduced "stop and think" strategy to implement before executing tasks. Her partner reinforced that he has been encouraging this at home. Continue POC.    Speech Therapy Frequency  2x / week    Duration  4 weeks    Treatment/Interventions  Patient/family education;Cueing hierarchy;Cognitive reorganization;Multimodal communcation approach;SLP instruction and  feedback;Internal/external aids;Compensatory techniques;Language facilitation    Potential to Achieve Goals  Good    Potential Considerations  Previous level of function    SLP Home Exercise Plan  Pt will completed HEP as assigned to facilitate carryover of treatment strategies and techniques in home environment with assist from caregiver as needed.    Consulted and Agree with Plan of Care  Patient;Family member/caregiver    Family Member Consulted  Significant other, Renea EeLarry Mabee       Patient will benefit from skilled therapeutic intervention in order to improve the following deficits and impairments:   Cognitive communication deficit    Problem List Patient Active Problem List   Diagnosis Date Noted  . Arthrosis of left acromioclavicular joint   . Rotator cuff syndrome of left shoulder   . Primary osteoarthritis of left shoulder   . Surgical aftercare, musculoskeletal system 10/11/2014  . Status post total left knee replacement 10/11/2014  . Arthritis of left knee 09/14/2014  . Knee contusion 12/27/2013  . Medial meniscus, posterior horn derangement 12/27/2013  . Arthritis of knee, left 12/27/2013  . Left knee pain 12/27/2013  . 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 KNEE PAIN 05/11/2007  . DISC DEGENERATION 05/11/2007  . SCIATICA 05/11/2007   Thank you,  Havery MorosDabney Madisan Bice, CCC-SLP [phone]  Faustine Tates 03/17/2017, 5:06 PM  [facility] [address] [place], [name], [address] Phone: [phone]   Fax:  [phone]   Name: Alyssa Poole MRN: [id number] Date of Birth: Dec 24, 1960

## 2017-03-20 ENCOUNTER — Encounter (HOSPITAL_COMMUNITY): Payer: Self-pay | Admitting: Speech Pathology

## 2017-03-20 ENCOUNTER — Ambulatory Visit (HOSPITAL_COMMUNITY): Payer: Medicare Other

## 2017-03-20 ENCOUNTER — Other Ambulatory Visit: Payer: Self-pay

## 2017-03-20 ENCOUNTER — Encounter (HOSPITAL_COMMUNITY): Payer: Self-pay

## 2017-03-20 ENCOUNTER — Ambulatory Visit (HOSPITAL_COMMUNITY): Payer: Medicare Other | Admitting: Speech Pathology

## 2017-03-20 DIAGNOSIS — R29898 Other symptoms and signs involving the musculoskeletal system: Secondary | ICD-10-CM

## 2017-03-20 DIAGNOSIS — R41841 Cognitive communication deficit: Secondary | ICD-10-CM | POA: Diagnosis not present

## 2017-03-20 DIAGNOSIS — R2689 Other abnormalities of gait and mobility: Secondary | ICD-10-CM

## 2017-03-20 NOTE — Patient Instructions (Addendum)
Functional Quadriceps: Sit to Stand    Sit on edge of chair, feet flat on floor. Stand upright, extending knees fully. Repeat 10 times per set. Do 2 sets per session.   http://orth.exer.us/735   Copyright  VHI. All rights reserved.   Tandem Stance    Standing beside sink place ight foot in front of left, heel touching toe both feet "straight ahead".  Balance in this position 30 seconds. Do with left foot in front of right.  Copyright  VHI. All rights reserved.   Single Leg Balance: Eyes Open    Stand on right leg with eyes open. Hold 30 seconds. 5 reps 1-2 times per day.  http://ggbe.exer.us/5   Copyright  VHI. All rights reserved.

## 2017-03-20 NOTE — Therapy (Signed)
[place] [facility] [address] [place], [place], [address] Phone: [phone]   Fax:  [phone]  Speech Language Pathology Treatment  Patient Details  Name: Alyssa Poole MRN: [id number] Date of Birth: 1960/05/12 Referring Provider: Renato Gails, MD   Encounter Date: 03/20/2017  End of Session - 03/20/17 1806    Visit Number  3    Number of Visits  9    Date for SLP Re-Evaluation  04/15/17    Authorization Type  Medicare    Authorization Time Period  before 10th visit    SLP Start Time  1522    SLP Stop Time   1605    SLP Time Calculation (min)  43 min    Activity Tolerance  Patient tolerated treatment well       Past Medical History:  Diagnosis Date  . Anxiety   . Arthritis   . Asthma   . Chronic headaches   . Chronic neck pain   . Depression 2002  . Dysconjugate gaze   . GERD (gastroesophageal reflux disease)   . Hypertension   . Neuromuscular disorder (HCC)   . Osteoporosis   . Panic attacks 2002    Past Surgical History:  Procedure Laterality Date  . BACK SURGERY     x4  . CERVICAL SPINE SURGERY     plate in neck  . CHOLECYSTECTOMY    . KNEE ARTHROSCOPY WITH MEDIAL MENISECTOMY Left 03/09/2014   Procedure: LEFT KNEE ARTHROSCOPY WITH PARTIAL MEDIAL MENISECTOMY;  Surgeon: Vickki Hearing, MD;  Location: [facility];  Service: Orthopedics;  Laterality: Left;  . SHOULDER ARTHROSCOPY Left 11/14/2016   Procedure: ARTHROSCOPY SHOULDER WITH LIMITED DEDBRIDEMENT AND ACROMIOPLASTY AND DISTAL CLAVICAL EXCISION;  Surgeon: Vickki Hearing, MD;  Location: [facility];  Service: Orthopedics;  Laterality: Left;  . TOTAL KNEE ARTHROPLASTY Left 09/14/2014   Procedure: LEFT TOTAL KNEE ARTHROPLASTY;  Surgeon: Vickki Hearing, MD;  Location: [facility];  Service: Orthopedics;  Laterality: Left;    There were no vitals filed for this visit.  Subjective Assessment - 03/20/17 1800    Subjective  "I think this is fun."    Patient is  accompained by:  Family member    Currently in Pain?  No/denies       ADULT SLP TREATMENT - 03/20/17 0001      General Information   Behavior/Cognition  Alert;Cooperative;Pleasant mood    Patient Positioning  Upright in chair    Oral care provided  N/A    HPI  Alyssa Poole is a 56 y/o right hand dominant female with PMHx significant for left eye blindness, hypertension, anxiety, chronic headaches who was admitted to [facility] on 01/29/2017 from an outside hospital with subarachnoid hemorrhage.  CT at outside hospital revealed large amount of acute subarachnoid hemorrhage in the basilar cisterns with small amount of hemorrhage in the fourth ventricle and mild hydrocephalus.  Right EVD was placed by neurosurgery.  Patient underwent cerebral arteriogram by Dr. Thedore Mins.  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  Dissection was treated with overlapping stents.  No definitive aneurysm or AVM was identified at that time.  In the ICU, the patient self extubated and partially removed her EVD.  The patient's ICU course was further complicated by fever.  Infectious workup was negative and the patient was treated empirically with vancomycin and cefepime initially.  Ultimately fever was attributed to neurogenic cause.  The patient's ICU course was further complicated by agitation  which was treated with Precedex, anemia which was treated with transfusion, hypokalemia, cerebral salt wasting, and repeated bolus requirements to maintain euvolemia.  The patient underwent repeat cerebral arteriogram on 02/05/2017.  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  There were no aneurysm or other vascular malformations to explain her subarachnoid hemorrhage at that time.  She was admitted to inpatient rehabilitation for comprehensive therapy and continued medical management on 02/19/17 and eventually discharged home with family on  03/07/2017. Dr. Cephus RicherAguilar has referred Alyssa Poole for outpatient SLP, OT, and PT.        Treatment Provided   Treatment provided  Cognitive-Linquistic      Pain Assessment   Pain Assessment  No/denies pain      Cognitive-Linquistic Treatment   Treatment focused on  Cognition;Patient/family/caregiver education;Aphasia    Skilled Treatment  memory strategies, visualization strategy related to describing home layout, providing written information      Assessment / Recommendations / Plan   Plan  Continue with current plan of care         SLP Short Term Goals - 03/20/17 1807      SLP SHORT TERM GOAL #1   Title  Pt will complete functional memory activities (mod level) with 90% acc when given mild/mod cues and use of memory strategies.    Baseline  25% independently; 80% with mod/max cues    Time  4    Period  Weeks    Status  On-going      SLP SHORT TERM GOAL #2   Title  Pt will complete selective and alternating attention tasks (moderately complex) with 80% acc and min cues.    Baseline  50%    Time  4    Period  Weeks    Status  On-going      SLP SHORT TERM GOAL #3   Title  Pt will describe objects and pictures by providing at least three salient features as judged by clinician with 90% acc when provided mi/mod cues.     Baseline  mod assist    Time  4    Period  Weeks    Status  On-going      SLP SHORT TERM GOAL #4   Title  Pt will increase divergent naming to 5 items per concrete category with mod cues.    Baseline  2 items    Time  4    Period  Weeks    Status  On-going       SLP Long Term Goals - 03/20/17 1807      SLP LONG TERM GOAL #1   Title  Pt will increase memory skills to [facility] with use of compensatory strategies as needed.     Baseline  min to mi/mod impairment    Time  2    Period  Months    Status  On-going      SLP LONG TERM GOAL #2   Title  Pt will increase verbal expression skills to [facility] with use of strategies as needed.    Baseline  min to  mi/mod impairment    Time  2    Period  Months    Status  On-going       Plan - 03/20/17 1807    Clinical Impression Statement  Pt accompanied to therapy by her significant other this date. She forgot her folder in car and we opted to address different  goals due to heavy rainfall. Pt and spouse deny any more bouts of confusion/geting lost in house. Word finding strategies were introduced to Pt and implemented in therapeutic tasks. Pt asked to describe pictured objects by verbalizing three salient features and her spouse was including in "guessing" the described word. Pt completed with 85% acc with mi/mod cues from SLP. Pt completed divergent naming tasks by naming five items in requested category with 100% acc with min cues. Continue POC.     Speech Therapy Frequency  2x / week    Duration  4 weeks    Treatment/Interventions  Patient/family education;Cueing hierarchy;Cognitive reorganization;Multimodal communcation approach;SLP instruction and feedback;Internal/external aids;Compensatory techniques;Language facilitation    Potential to Achieve Goals  Good    Potential Considerations  Previous level of function    SLP Home Exercise Plan  Pt will completed HEP as assigned to facilitate carryover of treatment strategies and techniques in home environment with assist from caregiver as needed.    Consulted and Agree with Plan of Care  Patient;Family member/caregiver    Family Member Consulted  Significant other, Renea EeLarry Helmkamp       Patient will benefit from skilled therapeutic intervention in order to improve the following deficits and impairments:   Cognitive communication deficit    Problem List Patient Active Problem List   Diagnosis Date Noted  . Arthrosis of left acromioclavicular joint   . Rotator cuff syndrome of left shoulder   . Primary osteoarthritis of left shoulder   . Surgical aftercare, musculoskeletal system 10/11/2014  . Status post total left knee replacement 10/11/2014   . Arthritis of left knee 09/14/2014  . Knee contusion 12/27/2013  . Medial meniscus, posterior horn derangement 12/27/2013  . Arthritis of knee, left 12/27/2013  . Left knee pain 12/27/2013  . 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 KNEE PAIN 05/11/2007  . DISC DEGENERATION 05/11/2007  . SCIATICA 05/11/2007   Thank you,  Havery MorosDabney Porter, CCC-SLP [phone]  Havery MorosPORTER,DABNEY 03/20/2017, 6:09 PM  [place] [facility] [address] , [name], [address] Phone: [phone]   Fax:  [phone]   Name: Alyssa Poole MRN: [id number] Date of Birth: 1960/11/28

## 2017-03-20 NOTE — Therapy (Signed)
[place] [facility] [address] [place], [place], [address] Phone: [phone]   Fax:  [phone]  Physical Therapy Treatment  Patient Details  Name: Alyssa Poole MRN: [id number] Date of Birth: 1961/02/26 Referring Provider: Renato Gails MD   Encounter Date: 03/20/2017  PT End of Session - 03/20/17 1441    Visit Number  3    Number of Visits  9    Date for PT Re-Evaluation  04/10/17    Authorization Type  Medicare part A and Medicaid [place] A    Authorization Time Period  03/13/17 - 04/19/17    Authorization - Visit Number  3    Authorization - Number of Visits  10    PT Start Time  1436    PT Stop Time  1523    PT Time Calculation (min)  47 min    Equipment Utilized During Treatment  Gait belt    Activity Tolerance  Patient tolerated treatment well    Behavior During Therapy  WFL for tasks assessed/performed       Past Medical History:  Diagnosis Date  . Anxiety   . Arthritis   . Asthma   . Chronic headaches   . Chronic neck pain   . Depression 2002  . Dysconjugate gaze   . GERD (gastroesophageal reflux disease)   . Hypertension   . Neuromuscular disorder (HCC)   . Osteoporosis   . Panic attacks 2002    Past Surgical History:  Procedure Laterality Date  . BACK SURGERY     x4  . CERVICAL SPINE SURGERY     plate in neck  . CHOLECYSTECTOMY    . KNEE ARTHROSCOPY WITH MEDIAL MENISECTOMY Left 03/09/2014   Procedure: LEFT KNEE ARTHROSCOPY WITH PARTIAL MEDIAL MENISECTOMY;  Surgeon: Vickki Hearing, MD;  Location: [facility];  Service: Orthopedics;  Laterality: Left;  . SHOULDER ARTHROSCOPY Left 11/14/2016   Procedure: ARTHROSCOPY SHOULDER WITH LIMITED DEDBRIDEMENT AND ACROMIOPLASTY AND DISTAL CLAVICAL EXCISION;  Surgeon: Vickki Hearing, MD;  Location: [facility];  Service: Orthopedics;  Laterality: Left;  . TOTAL KNEE ARTHROPLASTY Left 09/14/2014   Procedure: LEFT TOTAL KNEE ARTHROPLASTY;  Surgeon: Vickki Hearing, MD;   Location: [facility];  Service: Orthopedics;  Laterality: Left;    There were no vitals filed for this visit.  Subjective Assessment - 03/20/17 1438    Subjective  Pt stated she feels "off" today with her balance and c/o abdominal pain.      Pertinent History  hypertension, s/p SAH, osteoporosis, patient is blind in left eye    Patient Stated Goals  Patient states she would like to focus on balance and decrease pain in groin    Currently in Pain?  -- abdominal pain 10/10    Pain Score  10-Worst pain ever    Pain Location  Abdomen    Pain Descriptors / Indicators  Sore;Aching    Pain Type  Chronic pain    Pain Onset  More than a month ago    Pain Frequency  Intermittent    Aggravating Factors   walking and standing    Pain Relieving Factors  sitting, pain medication    Effect of Pain on Daily Activities  mod-max effect              Balance Exercises - 03/20/17 1453      Balance Exercises: Standing   Tandem Stance  Eyes open;Foam/compliant surface;3 reps;30 secs    SLS  Eyes open;5 reps  Lt 4", Rt 12" max     Rockerboard  Lateral;Anterior/posterior 2 min    Tandem Gait  2 reps    Marching Limitations  10x    Sit to Stand Time  pick ball off floor stand reach up to ceiling and sit.     Other Standing Exercises  walking with scarf catch forward and backwards; gait training wiht rods to improve UE/LE sequence x 226          PT Short Term Goals - 03/17/17 1607      PT SHORT TERM GOAL #1   Title  Patient and caregiver will report understanding and regular compliance with HEP.     Baseline  Educated about plans for therapy    Time  2    Period  Weeks    Status  Achieved      PT SHORT TERM GOAL #2   Title  Patient will demonstrate ability to perform single limb stance independently for 2 seconds bilaterally in order to help with stair ambulation.     Baseline  Patient is unable to perform single limb stance without assistance.     Time  2    Period  Weeks    Status   Achieved      PT SHORT TERM GOAL #3   Title  Patient will demonstrate improvement on Berg balance test of 5 points in order to improve balance and reduce risk of falls.     Baseline  Berg score: 38    Time  2    Period  Weeks    Status  On-going        PT Long Term Goals - 03/17/17 1608      PT LONG TERM GOAL #1   Title  Patient will demonstrate improvement in berg balance test of 8 points indicating improved balance and decreased risk of falls.     Baseline  Berg score: 38    Time  4    Period  Weeks    Status  On-going      PT LONG TERM GOAL #2   Title  Patient will demonstrate ability to perform single limb stance for 5 seconds independently bilaterally in order to assist with navigation in the environment and with stair navigation.     Baseline  able to stand x 10 seconds B today will change to 30 seconds     Time  4    Period  Weeks    Status  Revised      PT LONG TERM GOAL #3   Title  Patient will demonstrate improved gait mechanics with decreased hip drop and increased stride length with 50% of gait in order to improve functional mobility.     Baseline  Patient demonstrates hip drop and decreased stride length 100% of time with gait.     Time  4    Period  Weeks    Status  On-going      PT LONG TERM GOAL #4   Title  Patient's FOTO score will improve by 10% indicating improvement in functional mobility.     Baseline  FOTO score: 46% limited    Time  4    Period  Weeks    Status  On-going            Plan - 03/20/17 1531    Clinical Impression Statement  Progressed balance activities with dynamic activities and surfaces with min A for LOB episodes.  Cueing to improve focus  on activity to assist with balance.  Establised HEP and encouraged arm swing with gait to improve mechanics.    Rehab Potential  Good    Clinical Impairments Affecting Rehab Potential  pain; strength limitations in lower extremities; balance difficulties    PT Frequency  2x / week    PT  Duration  4 weeks    PT Treatment/Interventions  ADLs/Self Care Home Management;DME Instruction;Gait training;Stair training;Functional mobility training;Therapeutic activities;Therapeutic exercise;Balance training;Neuromuscular re-education;Patient/family education;Manual techniques;Passive range of motion;Energy conservation;Cryotherapy;Electrical Stimulation;Moist Heat;Ultrasound    PT Next Visit Plan  Continue UE/LE sequence wiht gait, opposite arm/leg raise (if pain tolerable with UE) and progress to balance beam.  Assess compliance with HEP.      PT Home Exercise Plan  03/20/17: sit to stand, tandem stance and SLS       Patient will benefit from skilled therapeutic intervention in order to improve the following deficits and impairments:  Abnormal gait, Decreased activity tolerance, Decreased cognition, Decreased endurance, Decreased range of motion, Decreased strength, Increased fascial restricitons, Improper body mechanics, Pain, Decreased balance, Decreased coordination, Decreased mobility, Decreased safety awareness, Difficulty walking, Postural dysfunction  Visit Diagnosis: Decreased strength of lower extremity  Other abnormalities of gait and mobility  Balance problems     Problem List Patient Active Problem List   Diagnosis Date Noted  . Arthrosis of left acromioclavicular joint   . Rotator cuff syndrome of left shoulder   . Primary osteoarthritis of left shoulder   . Surgical aftercare, musculoskeletal system 10/11/2014  . Status post total left knee replacement 10/11/2014  . Arthritis of left knee 09/14/2014  . Knee contusion 12/27/2013  . Medial meniscus, posterior horn derangement 12/27/2013  . Arthritis of knee, left 12/27/2013  . Left knee pain 12/27/2013  . 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 KNEE PAIN 05/11/2007  . DISC DEGENERATION 05/11/2007  . SCIATICA 05/11/2007   Becky Saxasey Lorey Pallett, LPTA; CBIS [phone]  Juel BurrowCockerham, Kena Limon Jo 03/20/2017, 3:36  PM  [facility] [address] [place], [name], [address] Phone: [phone]   Fax:  [phone]  Name: Alyssa Poole MRN: [id number] Date of Birth: [birth year]

## 2017-03-21 ENCOUNTER — Ambulatory Visit (HOSPITAL_COMMUNITY): Payer: Medicare Other | Admitting: Specialist

## 2017-03-21 DIAGNOSIS — M25512 Pain in left shoulder: Secondary | ICD-10-CM

## 2017-03-21 DIAGNOSIS — R41841 Cognitive communication deficit: Secondary | ICD-10-CM | POA: Diagnosis not present

## 2017-03-21 DIAGNOSIS — R29898 Other symptoms and signs involving the musculoskeletal system: Secondary | ICD-10-CM

## 2017-03-21 NOTE — Therapy (Signed)
[place] [facility] [address] [place], [name], [address] Phone: [phone]   Fax:  [phone]  Occupational Therapy Treatment  Patient Details  Name: Alyssa Poole MRN: [id number] Date of Birth: January 21, 1961 No Data Recorded  Encounter Date: 03/21/2017  OT End of Session - 03/21/17 1145    Visit Number  3    Number of Visits  8    Date for OT Re-Evaluation  04/12/17    Authorization Type  1) Medicare 2) Medicaid    Authorization Time Period  before 10th visit    Authorization - Visit Number  3    Authorization - Number of Visits  10    OT Start Time  1105    OT Stop Time  1150    OT Time Calculation (min)  45 min    Activity Tolerance  Patient tolerated treatment well    Behavior During Therapy  [facility]FL for tasks assessed/performed       Past Medical History:  Diagnosis Date  . Anxiety   . Arthritis   . Asthma   . Chronic headaches   . Chronic neck pain   . Depression 2002  . Dysconjugate gaze   . GERD (gastroesophageal reflux disease)   . Hypertension   . Neuromuscular disorder (HCC)   . Osteoporosis   . Panic attacks 2002    Past Surgical History:  Procedure Laterality Date  . BACK SURGERY     x4  . CERVICAL SPINE SURGERY     plate in neck  . CHOLECYSTECTOMY    . KNEE ARTHROSCOPY WITH MEDIAL MENISECTOMY Left 03/09/2014   Procedure: LEFT KNEE ARTHROSCOPY WITH PARTIAL MEDIAL MENISECTOMY;  Surgeon: Vickki HearingStanley E Harrison, MD;  Location: [facility];  Service: Orthopedics;  Laterality: Left;  . SHOULDER ARTHROSCOPY Left 11/14/2016   Procedure: ARTHROSCOPY SHOULDER WITH LIMITED DEDBRIDEMENT AND ACROMIOPLASTY AND DISTAL CLAVICAL EXCISION;  Surgeon: Vickki HearingHarrison, Stanley E, MD;  Location: [facility];  Service: Orthopedics;  Laterality: Left;  . TOTAL KNEE ARTHROPLASTY Left 09/14/2014   Procedure: LEFT TOTAL KNEE ARTHROPLASTY;  Surgeon: Vickki HearingStanley E Harrison, MD;  Location: [facility];  Service: Orthopedics;  Laterality: Left;    There were no vitals  filed for this visit.  Subjective Assessment - 03/21/17 1108    Subjective   s:  i have been having a time with my shoulder.     Currently in Pain?  Yes    Pain Score  6     Pain Location  Shoulder    Pain Orientation  Left    Pain Descriptors / Indicators  Aching    Pain Type  Acute pain    Pain Onset  More than a month ago    Pain Frequency  Constant    Aggravating Factors   use of left arm     Pain Relieving Factors  rest    Effect of Pain on Daily Activities  moderate         [facility] OT Assessment - 03/21/17 0001      Assessment   Medical Diagnosis  BUE weakness    Referring Provider   b               OT Treatments/Exercises (OP) - 03/21/17 0001      Exercises   Exercises  Shoulder;Hand      Shoulder Exercises: Supine   Protraction  PROM;5 reps;Strengthening;12 reps    Protraction Weight (lbs)  1    Horizontal ABduction  PROM;5 reps;Strengthening;12  reps    Horizontal ABduction Weight (lbs)  1    External Rotation  PROM;5 reps;Strengthening;12 reps    External Rotation Weight (lbs)  1    Internal Rotation  PROM;5 reps;Strengthening;12 reps    Internal Rotation Weight (lbs)  1    Flexion  PROM;5 reps;Strengthening;12 reps    Shoulder Flexion Weight (lbs)  1    ABduction  PROM;5 reps;Strengthening;12 reps    Shoulder ABduction Weight (lbs)  1      Shoulder Exercises: Seated   Elevation  AROM;10 reps    Extension  AROM;10 reps    Row  AROM;10 reps      Shoulder Exercises: ROM/Strengthening   Proximal Shoulder Strengthening, Supine  10 times no rest    Proximal Shoulder Strengthening, Seated  10 times no rest       Hand Exercises   Theraputty  Flatten;Roll;Grip;Locate Pegs    Theraputty - Flatten  pink in standing    Theraputty - Roll  pink seated    Theraputty - Grip  bilateral hands, supinated and pronated grip    Theraputty - Locate Pegs  10 beads with right hand       Manual Therapy   Manual Therapy  Myofascial release    Manual therapy  comments  Manual therapy completed prior to exercises.     Myofascial Release  Myofascial release and manual stretching completed to left upper arm, trapezius, and scapularis region to decrease fascial restrictions and increase joint mobility in a pain free zone.           Balance Exercises - 03/20/17 1453      Balance Exercises: Standing   Tandem Stance  Eyes open;Foam/compliant surface;3 reps;30 secs    SLS  Eyes open;5 reps Lt 4", Rt 12" max     Rockerboard  Lateral;Anterior/posterior 2 min    Tandem Gait  2 reps    Marching Limitations  10x    Sit to Stand Time  pick ball off floor stand reach up to ceiling and sit.     Other Standing Exercises  walking with scarf catch forward and backwards; gait training wiht rods to improve UE/LE sequence x 226          OT Short Term Goals - 03/14/17 0930      OT SHORT TERM GOAL #1   Title  Pt will be educated and independent with HEP to increase functional performance and endurance during ADL tasks.    Time  4    Period  Weeks    Status  On-going      OT SHORT TERM GOAL #2   Title  Patient will increase her grip and pinch strength to be within average norms in order to complete gripping tasks such as opening containers and jars with less difficulty.     Time  4    Period  Weeks    Status  On-going      OT SHORT TERM GOAL #3   Title  Patient will increase her BUE strength to 5/5 to increase functional performance during daily tasks.     Time  4    Period  Weeks    Status  On-going      OT SHORT TERM GOAL #4   Title  Patient will decrease fascial restrictions to min amount to increase functional mobility and comfort level when completing functional daily tasks.     Time  4    Period  Weeks    Status  New    Target Date  04/10/17               Plan - 03/21/17 1145    Clinical Impression Statement  A:  Increased repetitions with strengthening in supine to 12, passively able to stretch left shoulder further for greater  independence with adls.  added theraputty for right hand strengthening     Plan  P:  add x to v and w arms, ball on wall for improved shoulder stability required for ADL completion.        Patient will benefit from skilled therapeutic intervention in order to improve the following deficits and impairments:     Visit Diagnosis: Other symptoms and signs involving the musculoskeletal system  Acute pain of left shoulder    Problem List Patient Active Problem List   Diagnosis Date Noted  . Arthrosis of left acromioclavicular joint   . Rotator cuff syndrome of left shoulder   . Primary osteoarthritis of left shoulder   . Surgical aftercare, musculoskeletal system 10/11/2014  . Status post total left knee replacement 10/11/2014  . Arthritis of left knee 09/14/2014  . Knee contusion 12/27/2013  . Medial meniscus, posterior horn derangement 12/27/2013  . Arthritis of knee, left 12/27/2013  . Left knee pain 12/27/2013  . 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 KNEE PAIN 05/11/2007  . DISC DEGENERATION 05/11/2007  . SCIATICA 05/11/2007    Shirlean Mylar, MHA, OTR/L [id number]  03/21/2017, 11:48 AM  [facility] [address] [place], [place], [address] Phone: [phone]   Fax:  [phone]  Name: Alyssa Poole MRN: [id number] Date of Birth: [birth year]

## 2017-03-26 ENCOUNTER — Ambulatory Visit (HOSPITAL_COMMUNITY): Payer: Medicare Other | Admitting: Occupational Therapy

## 2017-03-26 ENCOUNTER — Telehealth (HOSPITAL_COMMUNITY): Payer: Self-pay | Admitting: Family Medicine

## 2017-03-26 NOTE — Telephone Encounter (Signed)
03/26/17  Pt called to see if she should come in .... She said she has a bad cold but I told her as long as she wasn't running a fever.  She said she didn't want to give to us but I told her it was up to her and she just said ok.

## 2017-03-27 ENCOUNTER — Telehealth (HOSPITAL_COMMUNITY): Payer: Self-pay | Admitting: Family Medicine

## 2017-03-27 ENCOUNTER — Ambulatory Visit (HOSPITAL_COMMUNITY): Payer: Medicare Other | Admitting: Speech Pathology

## 2017-03-27 ENCOUNTER — Ambulatory Visit (HOSPITAL_COMMUNITY): Payer: Medicare Other | Admitting: Physical Therapy

## 2017-03-27 NOTE — Telephone Encounter (Signed)
03/27/17  pt left a messge that she was still sick with a cold and won't be here today

## 2017-03-28 ENCOUNTER — Ambulatory Visit (HOSPITAL_COMMUNITY): Payer: Medicare Other | Admitting: Specialist

## 2017-04-02 ENCOUNTER — Encounter (HOSPITAL_COMMUNITY): Payer: Self-pay

## 2017-04-02 ENCOUNTER — Ambulatory Visit (HOSPITAL_COMMUNITY): Payer: Medicare Other | Attending: Physical Medicine & Rehabilitation | Admitting: Speech Pathology

## 2017-04-02 ENCOUNTER — Ambulatory Visit (HOSPITAL_COMMUNITY): Payer: Medicare Other

## 2017-04-02 ENCOUNTER — Encounter (HOSPITAL_COMMUNITY): Payer: Self-pay | Admitting: Speech Pathology

## 2017-04-02 ENCOUNTER — Other Ambulatory Visit: Payer: Self-pay

## 2017-04-02 DIAGNOSIS — R29898 Other symptoms and signs involving the musculoskeletal system: Secondary | ICD-10-CM

## 2017-04-02 DIAGNOSIS — M25512 Pain in left shoulder: Secondary | ICD-10-CM | POA: Diagnosis present

## 2017-04-02 DIAGNOSIS — R2689 Other abnormalities of gait and mobility: Secondary | ICD-10-CM

## 2017-04-02 DIAGNOSIS — R41841 Cognitive communication deficit: Secondary | ICD-10-CM | POA: Insufficient documentation

## 2017-04-02 NOTE — Therapy (Signed)
[facility] [address] [place], [name], [address] Phone: [phone]   Fax:  [phone]  Occupational Therapy Treatment  Patient Details  Name: Alyssa RuskBobbie W Poole MRN: [id number] Date of Birth: [birth year] No Data Recorded  Encounter Date: 04/02/2017  OT End of Session - 04/02/17 1057    Visit Number  4    Number of Visits  8    Date for OT Re-Evaluation  04/12/17    Authorization Type  1) Medicare 2) Medicaid    Authorization Time Period  before 10th visit    Authorization - Visit Number  4    Authorization - Number of Visits  10    OT Start Time  1035    OT Stop Time  1115    OT Time Calculation (min)  40 min    Activity Tolerance  Patient tolerated treatment well    Behavior During Therapy  [facility] for tasks assessed/performed       Past Medical History:  Diagnosis Date  . Anxiety   . Arthritis   . Asthma   . Chronic headaches   . Chronic neck pain   . Depression 2002  . Dysconjugate gaze   . GERD (gastroesophageal reflux disease)   . Hypertension   . Neuromuscular disorder (HCC)   . Osteoporosis   . Panic attacks 2002    Past Surgical History:  Procedure Laterality Date  . BACK SURGERY     x4  . CERVICAL SPINE SURGERY     plate in neck  . CHOLECYSTECTOMY    . KNEE ARTHROSCOPY WITH MEDIAL MENISECTOMY Left 03/09/2014   Procedure: LEFT KNEE ARTHROSCOPY WITH PARTIAL MEDIAL MENISECTOMY;  Surgeon: Vickki HearingStanley E Harrison, MD;  Location: [facility];  Service: Orthopedics;  Laterality: Left;  . SHOULDER ARTHROSCOPY Left 11/14/2016   Procedure: ARTHROSCOPY SHOULDER WITH LIMITED DEDBRIDEMENT AND ACROMIOPLASTY AND DISTAL CLAVICAL EXCISION;  Surgeon: Vickki HearingHarrison, Stanley E, MD;  Location: [facility];  Service: Orthopedics;  Laterality: Left;  . TOTAL KNEE ARTHROPLASTY Left 09/14/2014   Procedure: LEFT TOTAL KNEE ARTHROPLASTY;  Surgeon: Vickki HearingStanley E Harrison, MD;  Location: [facility];  Service: Orthopedics;  Laterality: Left;    There were no vitals  filed for this visit.  Subjective Assessment - 04/02/17 1052    Subjective   S: My shoulder hurts all the time.    Currently in Pain?  Yes    Pain Score  6     Pain Location  Shoulder    Pain Orientation  Left    Pain Descriptors / Indicators  Aching    Pain Type  Acute pain    Pain Radiating Towards  N/A    Pain Onset  More than a month ago    Pain Frequency  Constant    Aggravating Factors   use of left arm    Pain Relieving Factors  rest and massage    Effect of Pain on Daily Activities  moderate    Multiple Pain Sites  No         [facility] OT Assessment - 04/02/17 1053      Assessment   Medical Diagnosis  BUE weakness    Referring Provider  Alyssa Poole, John MD      Precautions   Precautions  Other (comment)    Precaution Comments  Low vision- left eye blind               OT Treatments/Exercises (OP) - 04/02/17 1055      Exercises  Exercises  Shoulder;Hand      Shoulder Exercises: Supine   Protraction  PROM;5 reps;Strengthening;10 reps    Protraction Weight (lbs)  2    Horizontal ABduction  PROM;5 reps;Strengthening;10 reps    Horizontal ABduction Weight (lbs)  2    External Rotation  PROM;5 reps;Strengthening;10 reps    External Rotation Weight (lbs)  2    Internal Rotation  PROM;5 reps;AROM;10 reps    Internal Rotation Weight (lbs)  2    Flexion  PROM;5 reps;Strengthening;10 reps    Shoulder Flexion Weight (lbs)  2    ABduction  PROM;5 reps;Strengthening;10 reps    Shoulder ABduction Weight (lbs)  1      Shoulder Exercises: Seated   Protraction  Strengthening;10 reps    Protraction Weight (lbs)  1    Horizontal ABduction  Strengthening;10 reps;Other (comment) each arm individually    External Rotation  Strengthening;10 reps    External Rotation Weight (lbs)  1    Internal Rotation  Strengthening;10 reps    Internal Rotation Weight (lbs)  1    Flexion  Strengthening;10 reps    Flexion Weight (lbs)  1    Abduction  Strengthening;10 reps;Other  (comment) each arm individually    ABduction Weight (lbs)  1      Shoulder Exercises: ROM/Strengthening   Proximal Shoulder Strengthening, Supine  10X with 2#    Proximal Shoulder Strengthening, Seated  12 times no rest       Hand Exercises   Hand Gripper with Large Beads  all beads with gripper set at 35#      Manual Therapy   Manual Therapy  Myofascial release    Manual therapy comments  Manual therapy completed prior to exercises.     Myofascial Release  Myofascial release and manual stretching completed to left upper arm, trapezius, and scapularis region to decrease fascial restrictions and increase joint mobility in a pain free zone.           Balance Exercises - 04/02/17 1156      Balance Exercises: Standing   Tandem Stance  Eyes open;Foam/compliant surface;3 reps;30 secs 1# dowel rod flexion in tandem on foam; throw/catch ball 2x     SLS  Eyes open;5 reps Lt 18", Rt 8"     SLS with Vectors  2 reps 2x 5" holds BLE intermittent HHA    Balance Beam  tandem and sidestep 2RT    Heel Raises Limitations  15x    Toe Raise Limitations  15x    Sit to Stand Time  pick ball off floor stand reach up to ceiling and sit.  10x    Other Standing Exercises  alternating prone LE raise 5x 5" (no UE due to shoulder pain)        OT Education - 04/02/17 1159    Education provided  Yes    Education Details  Reviewed HEP that was given to patient during inpatient rehab. Removed the A/ROM exercises and educated patient to continue completing strengthening using 2# supine or 1# seated.     Person(s) Educated  Patient    Methods  Explanation    Comprehension  Verbalized understanding       OT Short Term Goals - 03/14/17 0930      OT SHORT TERM GOAL #1   Title  Pt will be educated and independent with HEP to increase functional performance and endurance during ADL tasks.    Time  4    Period  Weeks  Status  On-going      OT SHORT TERM GOAL #2   Title  Patient will increase her grip  and pinch strength to be within average norms in order to complete gripping tasks such as opening containers and jars with less difficulty.     Time  4    Period  Weeks    Status  On-going      OT SHORT TERM GOAL #3   Title  Patient will increase her BUE strength to 5/5 to increase functional performance during daily tasks.     Time  4    Period  Weeks    Status  On-going      OT SHORT TERM GOAL #4   Title  Patient will decrease fascial restrictions to min amount to increase functional mobility and comfort level when completing functional daily tasks.     Time  4    Period  Weeks    Status  New    Target Date  04/10/17               Plan - 04/02/17 1200    Clinical Impression Statement  A: Pt completed all supine strengthening with 2# as she reports this is what she's using at home. Continued to use 1# weight seated for strengthening. Added handgripper task for strengthening. VC for form and technique.    Plan  P: Add X to V arms and ball on the wall. Continue with handgripper task.     Consulted and Agree with Plan of Care  Patient       Patient will benefit from skilled therapeutic intervention in order to improve the following deficits and impairments:  Pain, Impaired UE functional use, Increased fascial restrictions, Decreased strength  Visit Diagnosis: Other symptoms and signs involving the musculoskeletal system  Acute pain of left shoulder    Problem List Patient Active Problem List   Diagnosis Date Noted  . Arthrosis of left acromioclavicular joint   . Rotator cuff syndrome of left shoulder   . Primary osteoarthritis of left shoulder   . Surgical aftercare, musculoskeletal system 10/11/2014  . Status post total left knee replacement 10/11/2014  . Arthritis of left knee 09/14/2014  . Knee contusion 12/27/2013  . Medial meniscus, posterior horn derangement 12/27/2013  . Arthritis of knee, left 12/27/2013  . Left knee pain 12/27/2013  . CHONDROMALACIA  PATELLA 05/11/2007  . KNEE PAIN 05/11/2007  . DISC DEGENERATION 05/11/2007  . SCIATICA 05/11/2007   Limmie Patricia, OTR/L,CBIS  [phone]  04/02/2017, 12:04 PM  [facility] [address] [place], [place], [address] Phone: [phone]   Fax:  [phone]  Name: KERYL GHOLSON MRN: [id number] Date of Birth: [birth year]

## 2017-04-02 NOTE — Therapy (Signed)
[facility] [address] [place], [place], [address] Phone: [phone]   Fax:  [phone]  Physical Therapy Treatment  Patient Details  Name: Alyssa Poole MRN: [id number] Date of Birth: [birth year] Referring Provider: Renato Gails MD   Encounter Date: 04/02/2017  PT End of Session - 04/02/17 1159    Visit Number  4    Number of Visits  9    Date for PT Re-Evaluation  04/10/17    Authorization Type  Medicare part A and Medicaid [place] A    Authorization Time Period  03/13/17 - 04/19/17    Authorization - Visit Number  4    Authorization - Number of Visits  10    PT Start Time  1119    PT Stop Time  1200    PT Time Calculation (min)  41 min    Equipment Utilized During Treatment  Gait belt    Activity Tolerance  Patient tolerated treatment well    Behavior During Therapy  WFL for tasks assessed/performed       Past Medical History:  Diagnosis Date  . Anxiety   . Arthritis   . Asthma   . Chronic headaches   . Chronic neck pain   . Depression 2002  . Dysconjugate gaze   . GERD (gastroesophageal reflux disease)   . Hypertension   . Neuromuscular disorder (HCC)   . Osteoporosis   . Panic attacks 2002    Past Surgical History:  Procedure Laterality Date  . BACK SURGERY     x4  . CERVICAL SPINE SURGERY     plate in neck  . CHOLECYSTECTOMY    . KNEE ARTHROSCOPY WITH MEDIAL MENISECTOMY Left 03/09/2014   Procedure: LEFT KNEE ARTHROSCOPY WITH PARTIAL MEDIAL MENISECTOMY;  Surgeon: Vickki Hearing, MD;  Location: [facility];  Service: Orthopedics;  Laterality: Left;  . SHOULDER ARTHROSCOPY Left 11/14/2016   Procedure: ARTHROSCOPY SHOULDER WITH LIMITED DEDBRIDEMENT AND ACROMIOPLASTY AND DISTAL CLAVICAL EXCISION;  Surgeon: Vickki Hearing, MD;  Location: [facility];  Service: Orthopedics;  Laterality: Left;  . TOTAL KNEE ARTHROPLASTY Left 09/14/2014   Procedure: LEFT TOTAL KNEE ARTHROPLASTY;  Surgeon: Vickki Hearing, MD;   Location: [facility];  Service: Orthopedics;  Laterality: Left;    There were no vitals filed for this visit.  Subjective Assessment - 04/02/17 1123    Subjective  Pt stated she feels good today, arm feels better following OT today.    Patient Stated Goals  Patient states she would like to focus on balance and decrease pain in groin    Currently in Pain?  No/denies                           Balance Exercises - 04/02/17 1156      Balance Exercises: Standing   Tandem Stance  Eyes open;Foam/compliant surface;3 reps;30 secs 1# dowel rod flexion in tandem on foam; throw/catch ball 2x     SLS  Eyes open;5 reps Lt 18", Rt 8"     SLS with Vectors  2 reps 2x 5" holds BLE intermittent HHA    Balance Beam  tandem and sidestep 2RT    Heel Raises Limitations  15x    Toe Raise Limitations  15x    Sit to Stand Time  pick ball off floor stand reach up to ceiling and sit.  10x    Other Standing Exercises  alternating prone LE raise 5x 5" (  no UE due to shoulder pain)          PT Short Term Goals - 03/17/17 1607      PT SHORT TERM GOAL #1   Title  Patient and caregiver will report understanding and regular compliance with HEP.     Baseline  Educated about plans for therapy    Time  2    Period  Weeks    Status  Achieved      PT SHORT TERM GOAL #2   Title  Patient will demonstrate ability to perform single limb stance independently for 2 seconds bilaterally in order to help with stair ambulation.     Baseline  Patient is unable to perform single limb stance without assistance.     Time  2    Period  Weeks    Status  Achieved      PT SHORT TERM GOAL #3   Title  Patient will demonstrate improvement on Berg balance test of 5 points in order to improve balance and reduce risk of falls.     Baseline  Berg score: 38    Time  2    Period  Weeks    Status  On-going        PT Long Term Goals - 03/17/17 1608      PT LONG TERM GOAL #1   Title  Patient will demonstrate  improvement in berg balance test of 8 points indicating improved balance and decreased risk of falls.     Baseline  Berg score: 38    Time  4    Period  Weeks    Status  On-going      PT LONG TERM GOAL #2   Title  Patient will demonstrate ability to perform single limb stance for 5 seconds independently bilaterally in order to assist with navigation in the environment and with stair navigation.     Baseline  able to stand x 10 seconds B today will change to 30 seconds     Time  4    Period  Weeks    Status  Revised      PT LONG TERM GOAL #3   Title  Patient will demonstrate improved gait mechanics with decreased hip drop and increased stride length with 50% of gait in order to improve functional mobility.     Baseline  Patient demonstrates hip drop and decreased stride length 100% of time with gait.     Time  4    Period  Weeks    Status  On-going      PT LONG TERM GOAL #4   Title  Patient's FOTO score will improve by 10% indicating improvement in functional mobility.     Baseline  FOTO score: 46% limited    Time  4    Period  Weeks    Status  On-going            Plan - 04/02/17 1202    Clinical Impression Statement  Progressed balance activities with dynamic surfaces and dual tasking.  Began balance beam and UE movements in tandem stance with min A required for LOB episodes.  Pt progressing well with no reports of pain through session, was limited by fatigue at EOS.      Rehab Potential  Good    Clinical Impairments Affecting Rehab Potential  pain; strength limitations in lower extremities; balance difficulties    PT Frequency  2x / week    PT Duration  4 weeks  PT Treatment/Interventions  ADLs/Self Care Home Management;DME Instruction;Gait training;Stair training;Functional mobility training;Therapeutic activities;Therapeutic exercise;Balance training;Neuromuscular re-education;Patient/family education;Manual techniques;Passive range of motion;Energy  conservation;Cryotherapy;Electrical Stimulation;Moist Heat;Ultrasound    PT Next Visit Plan  Continue progressing balance activities.  Add hurdles and progress to balance activites on [employer] next session.    PT Home Exercise Plan  03/20/17: sit to stand, tandem stance and SLS       Patient will benefit from skilled therapeutic intervention in order to improve the following deficits and impairments:  Abnormal gait, Decreased activity tolerance, Decreased cognition, Decreased endurance, Decreased range of motion, Decreased strength, Increased fascial restricitons, Improper body mechanics, Pain, Decreased balance, Decreased coordination, Decreased mobility, Decreased safety awareness, Difficulty walking, Postural dysfunction  Visit Diagnosis: Decreased strength of lower extremity  Other abnormalities of gait and mobility  Balance problems     Problem List Patient Active Problem List   Diagnosis Date Noted  . Arthrosis of left acromioclavicular joint   . Rotator cuff syndrome of left shoulder   . Primary osteoarthritis of left shoulder   . Surgical aftercare, musculoskeletal system 10/11/2014  . Status post total left knee replacement 10/11/2014  . Arthritis of left knee 09/14/2014  . Knee contusion 12/27/2013  . Medial meniscus, posterior horn derangement 12/27/2013  . Arthritis of knee, left 12/27/2013  . Left knee pain 12/27/2013  . 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 KNEE PAIN 05/11/2007  . DISC DEGENERATION 05/11/2007  . SCIATICA 05/11/2007   Becky Saxasey Cockerham, LPTA; CBIS [phone]  Juel BurrowCockerham, Casey Jo 04/02/2017, 12:08 PM  [facility] [address] [place], [name], [address] Phone: [phone]   Fax:  [phone]  Name: Alyssa Poole MRN: [id number] Date of Birth: Aug 08, 1960

## 2017-04-02 NOTE — Therapy (Signed)
[facility] [address] [place], [place], [address] Phone: [phone]   Fax:  [phone]  Speech Language Pathology Treatment  Patient Details  Name: Alyssa Poole MRN: [id number] Date of Birth: 05-13-1960 Referring Provider: Renato Gails, MD   Encounter Date: 04/02/2017  End of Session - 04/02/17 1205    Visit Number  4    Number of Visits  9    Date for SLP Re-Evaluation  04/15/17    Authorization Type  Medicare    Authorization Time Period  before 10th visit    SLP Start Time  0950    SLP Stop Time   1032    SLP Time Calculation (min)  42 min    Activity Tolerance  Patient tolerated treatment well       Past Medical History:  Diagnosis Date  . Anxiety   . Arthritis   . Asthma   . Chronic headaches   . Chronic neck pain   . Depression 2002  . Dysconjugate gaze   . GERD (gastroesophageal reflux disease)   . Hypertension   . Neuromuscular disorder (HCC)   . Osteoporosis   . Panic attacks 2002    Past Surgical History:  Procedure Laterality Date  . BACK SURGERY     x4  . CERVICAL SPINE SURGERY     plate in neck  . CHOLECYSTECTOMY    . KNEE ARTHROSCOPY WITH MEDIAL MENISECTOMY Left 03/09/2014   Procedure: LEFT KNEE ARTHROSCOPY WITH PARTIAL MEDIAL MENISECTOMY;  Surgeon: Vickki Hearing, MD;  Location: [facility];  Service: Orthopedics;  Laterality: Left;  . SHOULDER ARTHROSCOPY Left 11/14/2016   Procedure: ARTHROSCOPY SHOULDER WITH LIMITED DEDBRIDEMENT AND ACROMIOPLASTY AND DISTAL CLAVICAL EXCISION;  Surgeon: Vickki Hearing, MD;  Location: [facility];  Service: Orthopedics;  Laterality: Left;  . TOTAL KNEE ARTHROPLASTY Left 09/14/2014   Procedure: LEFT TOTAL KNEE ARTHROPLASTY;  Surgeon: Vickki Hearing, MD;  Location: [facility];  Service: Orthopedics;  Laterality: Left;    There were no vitals filed for this visit.  Subjective Assessment - 04/02/17 1204    Subjective  "Do you think I am doing well?"    Patient is  accompained by:  Family member    Currently in Pain?  No/denies            ADULT SLP TREATMENT - 04/02/17 1204      General Information   Behavior/Cognition  Alert;Cooperative;Pleasant mood    Patient Positioning  Upright in chair    Oral care provided  N/A    HPI  Alyssa Poole is a 57 y/o right hand dominant female with PMHx significant for left eye blindness, hypertension, anxiety, chronic headaches who was admitted to [facility] on 01/29/2017 from an outside hospital with subarachnoid hemorrhage.  CT at outside hospital revealed large amount of acute subarachnoid hemorrhage in the basilar cisterns with small amount of hemorrhage in the fourth ventricle and mild hydrocephalus.  Right EVD was placed by neurosurgery.  Patient underwent cerebral arteriogram by Dr. Thedore Mins.  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  Dissection was treated with overlapping stents.  No definitive aneurysm or AVM was identified at that time.  In the ICU, the patient self extubated and partially removed her EVD.  The patient's ICU course was further complicated by fever.  Infectious workup was negative and the patient was treated empirically with vancomycin and cefepime initially.  Ultimately fever was attributed to neurogenic cause.  The patient's  ICU course was further complicated by agitation which was treated with Precedex, anemia which was treated with transfusion, hypokalemia, cerebral salt wasting, and repeated bolus requirements to maintain euvolemia.  The patient underwent repeat cerebral arteriogram on 02/05/2017.  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  There were no aneurysm or other vascular malformations to explain her subarachnoid hemorrhage at that time.  She was admitted to inpatient rehabilitation for comprehensive therapy and continued medical management on 02/19/17 and eventually discharged home with  family on 03/07/2017. Dr. Cephus RicherAguilar has referred Alyssa Poole for outpatient SLP, OT, and PT.        Treatment Provided   Treatment provided  Cognitive-Linquistic      Pain Assessment   Pain Assessment  No/denies pain      Cognitive-Linquistic Treatment   Treatment focused on  Cognition;Patient/family/caregiver education;Aphasia    Skilled Treatment  memory strategies, visualization strategy related to describing home layout, providing written information      Assessment / Recommendations / Plan   Plan  Continue with current plan of care      Progression Toward Goals   Progression toward goals  Progressing toward goals         SLP Short Term Goals - 04/02/17 1216      SLP SHORT TERM GOAL #1   Title  Pt will complete functional memory activities (mod level) with 90% acc when given mild/mod cues and use of memory strategies.    Baseline  25% independently; 80% with mod/max cues    Time  4    Period  Weeks    Status  On-going      SLP SHORT TERM GOAL #2   Title  Pt will complete selective and alternating attention tasks (moderately complex) with 80% acc and min cues.    Baseline  50%    Time  4    Period  Weeks    Status  On-going      SLP SHORT TERM GOAL #3   Title  Pt will describe objects and pictures by providing at least three salient features as judged by clinician with 90% acc when provided mi/mod cues.     Baseline  mod assist    Time  4    Period  Weeks    Status  On-going      SLP SHORT TERM GOAL #4   Title  Pt will increase divergent naming to 5 items per concrete category with mod cues.    Baseline  2 items    Time  4    Period  Weeks    Status  On-going       SLP Long Term Goals - 04/02/17 1216      SLP LONG TERM GOAL #1   Title  Pt will increase memory skills to [facility] with use of compensatory strategies as needed.     Baseline  min to mi/mod impairment    Time  2    Period  Months    Status  On-going      SLP LONG TERM GOAL #2   Title  Pt will  increase verbal expression skills to [facility] with use of strategies as needed.    Baseline  min to mi/mod impairment    Time  2    Period  Months    Status  On-going       Plan - 04/02/17 1206    Clinical Impression  Statement  Pt reports improvement in memory skills at home and has had no recurrence of disorientation that she had several weeks ago. Memory strategies were reviewed and implemented with min cues during functional tasks in treatment. Pt recalled 6/10 words after first trial with word association cues, 9/10 on the second trial, and 10/10 on the third trial. She was then able to recall seven words without SLP cues. Pt very pleased with her performance this date. Her husband inquired about driving and SLP suggested that they discuss with her doctor when they see him in two weeks. Continue with POC.     Speech Therapy Frequency  2x / week    Duration  4 weeks    Treatment/Interventions  Patient/family education;Cueing hierarchy;Cognitive reorganization;Multimodal communcation approach;SLP instruction and feedback;Internal/external aids;Compensatory techniques;Language facilitation    Potential to Achieve Goals  Good    Potential Considerations  Previous level of function    SLP Home Exercise Plan  Pt will completed HEP as assigned to facilitate carryover of treatment strategies and techniques in home environment with assist from caregiver as needed.    Consulted and Agree with Plan of Care  Patient;Family member/caregiver    Family Member Consulted  Significant other, Dynasia Kercheval       Patient will benefit from skilled therapeutic intervention in order to improve the following deficits and impairments:   Cognitive communication deficit    Problem List Patient Active Problem List   Diagnosis Date Noted  . Arthrosis of left acromioclavicular joint   . Rotator cuff syndrome of left shoulder   . Primary osteoarthritis of left shoulder   . Surgical aftercare, musculoskeletal system  10/11/2014  . Status post total left knee replacement 10/11/2014  . Arthritis of left knee 09/14/2014  . Knee contusion 12/27/2013  . Medial meniscus, posterior horn derangement 12/27/2013  . Arthritis of knee, left 12/27/2013  . Left knee pain 12/27/2013  . 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 KNEE PAIN 05/11/2007  . DISC DEGENERATION 05/11/2007  . SCIATICA 05/11/2007   Thank you,  Havery Moros, CCC-SLP [phone]  [facility] 04/02/2017, 12:16 PM  [facility] [address] [place], [place], [address] Phone: [phone]   Fax:  [phone]   Name: Alyssa Poole MRN: [id number] Date of Birth: 1960-04-12

## 2017-04-04 ENCOUNTER — Ambulatory Visit (HOSPITAL_COMMUNITY): Payer: Medicare Other | Admitting: Physical Therapy

## 2017-04-04 ENCOUNTER — Encounter (HOSPITAL_COMMUNITY): Payer: Self-pay | Admitting: Physical Therapy

## 2017-04-04 DIAGNOSIS — R29898 Other symptoms and signs involving the musculoskeletal system: Secondary | ICD-10-CM

## 2017-04-04 DIAGNOSIS — R2689 Other abnormalities of gait and mobility: Secondary | ICD-10-CM

## 2017-04-04 DIAGNOSIS — R41841 Cognitive communication deficit: Secondary | ICD-10-CM | POA: Diagnosis not present

## 2017-04-04 NOTE — Therapy (Signed)
[facility] [address] [place], [place], [address] Phone: [phone]   Fax:  [phone]  Physical Therapy Treatment  Patient Details  Name: Alyssa Poole MRN: [id number] Date of Birth: 1960/09/09 Referring Provider: Renato Gails MD   Encounter Date: 04/04/2017  PT End of Session - 04/04/17 1604    Visit Number  5    Number of Visits  9    Date for PT Re-Evaluation  04/10/17    Authorization Type  Medicare part A and Medicaid [place] A    Authorization Time Period  03/13/17 - 04/19/17    Authorization - Visit Number  5    Authorization - Number of Visits  10    PT Start Time  1520    PT Stop Time  1600    PT Time Calculation (min)  40 min    Equipment Utilized During Treatment  Gait belt    Activity Tolerance  Patient tolerated treatment well    Behavior During Therapy  WFL for tasks assessed/performed       Past Medical History:  Diagnosis Date  . Anxiety   . Arthritis   . Asthma   . Chronic headaches   . Chronic neck pain   . Depression 2002  . Dysconjugate gaze   . GERD (gastroesophageal reflux disease)   . Hypertension   . Neuromuscular disorder (HCC)   . Osteoporosis   . Panic attacks 2002    Past Surgical History:  Procedure Laterality Date  . BACK SURGERY     x4  . CERVICAL SPINE SURGERY     plate in neck  . CHOLECYSTECTOMY    . KNEE ARTHROSCOPY WITH MEDIAL MENISECTOMY Left 03/09/2014   Procedure: LEFT KNEE ARTHROSCOPY WITH PARTIAL MEDIAL MENISECTOMY;  Surgeon: Vickki Hearing, MD;  Location: [facility];  Service: Orthopedics;  Laterality: Left;  . SHOULDER ARTHROSCOPY Left 11/14/2016   Procedure: ARTHROSCOPY SHOULDER WITH LIMITED DEDBRIDEMENT AND ACROMIOPLASTY AND DISTAL CLAVICAL EXCISION;  Surgeon: Vickki Hearing, MD;  Location: [facility];  Service: Orthopedics;  Laterality: Left;  . TOTAL KNEE ARTHROPLASTY Left 09/14/2014   Procedure: LEFT TOTAL KNEE ARTHROPLASTY;  Surgeon: Vickki Hearing, MD;   Location: [facility];  Service: Orthopedics;  Laterality: Left;    There were no vitals filed for this visit.  Subjective Assessment - 04/04/17 1523    Subjective  Pt states that she is not having difficulty doing anything at home.  She has no pain.     Currently in Pain?  No/denies                Balance Exercises - 04/04/17 1559      Balance Exercises: Standing   Tandem Stance  Eyes open;Foam/compliant surface;Limitations with 2# rod chest press than upto extension    SLS with Vectors  Foam/compliant surface;3 reps;5 reps    Balance Master: Limits for Stability  2:00    Balance Master: Dynamic  2:38    Balance Beam  tandem and sidestep 2RT with hurdles     Marching Limitations  10 on foam with opposite arm flexion     Sit to Stand Time  pick ball off floor stand reach up to ceiling and sit.  10x          PT Short Term Goals - 03/17/17 1607      PT SHORT TERM GOAL #1   Title  Patient and caregiver will report understanding and regular compliance with HEP.  Baseline  Educated about plans for therapy    Time  2    Period  Weeks    Status  Achieved      PT SHORT TERM GOAL #2   Title  Patient will demonstrate ability to perform single limb stance independently for 2 seconds bilaterally in order to help with stair ambulation.     Baseline  Patient is unable to perform single limb stance without assistance.     Time  2    Period  Weeks    Status  Achieved      PT SHORT TERM GOAL #3   Title  Patient will demonstrate improvement on Berg balance test of 5 points in order to improve balance and reduce risk of falls.     Baseline  Berg score: 38    Time  2    Period  Weeks    Status  On-going        PT Long Term Goals - 03/17/17 1608      PT LONG TERM GOAL #1   Title  Patient will demonstrate improvement in berg balance test of 8 points indicating improved balance and decreased risk of falls.     Baseline  Berg score: 38    Time  4    Period  Weeks    Status   On-going      PT LONG TERM GOAL #2   Title  Patient will demonstrate ability to perform single limb stance for 5 seconds independently bilaterally in order to assist with navigation in the environment and with stair navigation.     Baseline  able to stand x 10 seconds B today will change to 30 seconds     Time  4    Period  Weeks    Status  Revised      PT LONG TERM GOAL #3   Title  Patient will demonstrate improved gait mechanics with decreased hip drop and increased stride length with 50% of gait in order to improve functional mobility.     Baseline  Patient demonstrates hip drop and decreased stride length 100% of time with gait.     Time  4    Period  Weeks    Status  On-going      PT LONG TERM GOAL #4   Title  Patient's FOTO score will improve by 10% indicating improvement in functional mobility.     Baseline  FOTO score: 46% limited    Time  4    Period  Weeks    Status  On-going            Plan - 04/04/17 1605    Clinical Impression Statement  Progressed pt to balance master as well as hurdles on balance beam.  Pt continues to improve in her balance needing minimal assist for proper form.      Rehab Potential  Good    Clinical Impairments Affecting Rehab Potential  pain; strength limitations in lower extremities; balance difficulties    PT Frequency  2x / week    PT Duration  4 weeks    PT Treatment/Interventions  ADLs/Self Care Home Management;DME Instruction;Gait training;Stair training;Functional mobility training;Therapeutic activities;Therapeutic exercise;Balance training;Neuromuscular re-education;Patient/family education;Manual techniques;Passive range of motion;Energy conservation;Cryotherapy;Electrical Stimulation;Moist Heat;Ultrasound    PT Next Visit Plan  complete warrior poses.     Consulted and Agree with Plan of Care  Patient       Patient will benefit from skilled therapeutic intervention in order to improve  the following deficits and impairments:   Abnormal gait, Decreased activity tolerance, Decreased cognition, Decreased endurance, Decreased range of motion, Decreased strength, Increased fascial restricitons, Improper body mechanics, Pain, Decreased balance, Decreased coordination, Decreased mobility, Decreased safety awareness, Difficulty walking, Postural dysfunction  Visit Diagnosis: Other abnormalities of gait and mobility  Balance problems  Decreased strength of lower extremity     Problem List Patient Active Problem List   Diagnosis Date Noted  . Arthrosis of left acromioclavicular joint   . Rotator cuff syndrome of left shoulder   . Primary osteoarthritis of left shoulder   . Surgical aftercare, musculoskeletal system 10/11/2014  . Status post total left knee replacement 10/11/2014  . Arthritis of left knee 09/14/2014  . Knee contusion 12/27/2013  . Medial meniscus, posterior horn derangement 12/27/2013  . Arthritis of knee, left 12/27/2013  . Left knee pain 12/27/2013  . 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 KNEE PAIN 05/11/2007  . DISC DEGENERATION 05/11/2007  . SCIATICA 05/11/2007   Virgina Organynthia Adreanne Yono, PT CLT [phone] 04/04/2017, 4:08 PM  [facility] [address] [place], [name], [address] Phone: [phone]   Fax:  [phone]  Name: Alyssa Poole MRN: [id number] Date of Birth: 1960-12-31

## 2017-04-08 ENCOUNTER — Other Ambulatory Visit: Payer: Self-pay

## 2017-04-08 ENCOUNTER — Ambulatory Visit (HOSPITAL_COMMUNITY): Payer: Medicare Other

## 2017-04-08 ENCOUNTER — Ambulatory Visit (HOSPITAL_COMMUNITY): Payer: Medicare Other | Admitting: Speech Pathology

## 2017-04-08 ENCOUNTER — Ambulatory Visit (HOSPITAL_COMMUNITY): Payer: Medicare Other | Admitting: Occupational Therapy

## 2017-04-08 ENCOUNTER — Encounter (HOSPITAL_COMMUNITY): Payer: Self-pay | Admitting: Speech Pathology

## 2017-04-08 ENCOUNTER — Encounter (HOSPITAL_COMMUNITY): Payer: Self-pay

## 2017-04-08 DIAGNOSIS — R41841 Cognitive communication deficit: Secondary | ICD-10-CM | POA: Diagnosis not present

## 2017-04-08 DIAGNOSIS — R29898 Other symptoms and signs involving the musculoskeletal system: Secondary | ICD-10-CM

## 2017-04-08 DIAGNOSIS — R2689 Other abnormalities of gait and mobility: Secondary | ICD-10-CM

## 2017-04-08 NOTE — Therapy (Signed)
[facility] [address] [place], [name], [address] Phone: [phone]   Fax:  [phone]  Occupational Therapy Treatment  Patient Details  Name: Alyssa Poole MRN: [id number] Date of Birth: 10-01-1960 No Data Recorded  Encounter Date: 04/08/2017  OT End of Session - 04/08/17 1437    Visit Number  5    Number of Visits  8    Date for OT Re-Evaluation  04/12/17    Authorization Type  1) Medicare 2) Medicaid    Authorization Time Period  before 10th visit    Authorization - Visit Number  5    Authorization - Number of Visits  10    OT Start Time  1405 pt arrived late    OT Stop Time  1433    OT Time Calculation (min)  28 min    Activity Tolerance  Patient tolerated treatment well    Behavior During Therapy  [facility] for tasks assessed/performed       Past Medical History:  Diagnosis Date  . Anxiety   . Arthritis   . Asthma   . Chronic headaches   . Chronic neck pain   . Depression 2002  . Dysconjugate gaze   . GERD (gastroesophageal reflux disease)   . Hypertension   . Neuromuscular disorder (HCC)   . Osteoporosis   . Panic attacks 2002    Past Surgical History:  Procedure Laterality Date  . BACK SURGERY     x4  . CERVICAL SPINE SURGERY     plate in neck  . CHOLECYSTECTOMY    . KNEE ARTHROSCOPY WITH MEDIAL MENISECTOMY Left 03/09/2014   Procedure: LEFT KNEE ARTHROSCOPY WITH PARTIAL MEDIAL MENISECTOMY;  Surgeon: Vickki HearingStanley E Harrison, MD;  Location: [facility];  Service: Orthopedics;  Laterality: Left;  . SHOULDER ARTHROSCOPY Left 11/14/2016   Procedure: ARTHROSCOPY SHOULDER WITH LIMITED DEDBRIDEMENT AND ACROMIOPLASTY AND DISTAL CLAVICAL EXCISION;  Surgeon: Vickki HearingHarrison, Stanley E, MD;  Location: [facility];  Service: Orthopedics;  Laterality: Left;  . TOTAL KNEE ARTHROPLASTY Left 09/14/2014   Procedure: LEFT TOTAL KNEE ARTHROPLASTY;  Surgeon: Vickki HearingStanley E Harrison, MD;  Location: [facility];  Service: Orthopedics;  Laterality: Left;    There  were no vitals filed for this visit.  Subjective Assessment - 04/08/17 1408    Subjective   S: This is a little bit hard. (hand gripper)    Currently in Pain?  No/denies         [facility] Assessment - 04/08/17 1407      Assessment   Medical Diagnosis  BUE weakness      Precautions   Precautions  Other (comment)    Precaution Comments  Low vision- left eye blind               OT Treatments/Exercises (OP) - 04/08/17 1409      Exercises   Exercises  Shoulder;Hand      Hand Exercises   Hand Gripper with Large Beads  all beads with gripper set at 35#    Hand Gripper with Medium Beads  all beads gripper at 25#    Sponges  pt used green clothespin to grasp and place 20 high resistance sponges into bucket using 3 point pinch. Min/mod difficulty with task               OT Short Term Goals - 03/14/17 0930      OT SHORT TERM GOAL #1   Title  Pt will be educated and independent  with HEP to increase functional performance and endurance during ADL tasks.    Time  4    Period  Weeks    Status  On-going      OT SHORT TERM GOAL #2   Title  Patient will increase her grip and pinch strength to be within average norms in order to complete gripping tasks such as opening containers and jars with less difficulty.     Time  4    Period  Weeks    Status  On-going      OT SHORT TERM GOAL #3   Title  Patient will increase her BUE strength to 5/5 to increase functional performance during daily tasks.     Time  4    Period  Weeks    Status  On-going      OT SHORT TERM GOAL #4   Title  Patient will decrease fascial restrictions to min amount to increase functional mobility and comfort level when completing functional daily tasks.     Time  4    Period  Weeks    Status  New    Target Date  04/10/17               Plan - 04/08/17 1437    Clinical Impression Statement  A: Session focusing on grip and pinch strengthening, unable to complete shoulder exercises due to  time constraints as pt arrived late to session. Pt requiring significantly increased time for hand gripper exercises, verbal cuing for technique with gripper. Min difficulty with green clothesping during pinch task.     Plan  P: Reassessment, resume missed exercises and add x to v arms and ball on the wall    Consulted and Agree with Plan of Care  Patient       Patient will benefit from skilled therapeutic intervention in order to improve the following deficits and impairments:  Pain, Impaired UE functional use, Increased fascial restrictions, Decreased strength  Visit Diagnosis: Other symptoms and signs involving the musculoskeletal system    Problem List Patient Active Problem List   Diagnosis Date Noted  . Arthrosis of left acromioclavicular joint   . Rotator cuff syndrome of left shoulder   . Primary osteoarthritis of left shoulder   . Surgical aftercare, musculoskeletal system 10/11/2014  . Status post total left knee replacement 10/11/2014  . Arthritis of left knee 09/14/2014  . Knee contusion 12/27/2013  . Medial meniscus, posterior horn derangement 12/27/2013  . Arthritis of knee, left 12/27/2013  . Left knee pain 12/27/2013  . 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 KNEE PAIN 05/11/2007  . DISC DEGENERATION 05/11/2007  . SCIATICA 05/11/2007   Ezra Sites, OTR/L  [phone] 04/08/2017, 2:39 PM  [facility] [address] [place], [place], [address] Phone: [phone]   Fax:  [phone]  Name: Alyssa Poole MRN: [id number] Date of Birth: Nov 06, 1960

## 2017-04-08 NOTE — Therapy (Signed)
[facility] [address] [place], [name], [address] Phone: [phone]   Fax:  [phone]  Physical Therapy Treatment  Patient Details  Name: Alyssa Poole MRN: [id number] Date of Birth: Apr 17, 1960 Referring Provider: Renato GailsAguilar, John MD   Encounter Date: 04/08/2017  PT End of Session - 04/08/17 1517    Visit Number  6    Number of Visits  9    Date for PT Re-Evaluation  04/10/17    Authorization Type  Medicare part A and Medicaid [place] A    Authorization Time Period  03/13/17 - 04/19/17    Authorization - Visit Number  6    Authorization - Number of Visits  10    PT Start Time  1520    PT Stop Time  1600    PT Time Calculation (min)  40 min    Equipment Utilized During Treatment  Gait belt    Activity Tolerance  Patient tolerated treatment well    Behavior During Therapy  WFL for tasks assessed/performed       Past Medical History:  Diagnosis Date  . Anxiety   . Arthritis   . Asthma   . Chronic headaches   . Chronic neck pain   . Depression 2002  . Dysconjugate gaze   . GERD (gastroesophageal reflux disease)   . Hypertension   . Neuromuscular disorder (HCC)   . Osteoporosis   . Panic attacks 2002    Past Surgical History:  Procedure Laterality Date  . BACK SURGERY     x4  . CERVICAL SPINE SURGERY     plate in neck  . CHOLECYSTECTOMY    . KNEE ARTHROSCOPY WITH MEDIAL MENISECTOMY Left 03/09/2014   Procedure: LEFT KNEE ARTHROSCOPY WITH PARTIAL MEDIAL MENISECTOMY;  Surgeon: Vickki HearingStanley E Harrison, MD;  Location: [facility];  Service: Orthopedics;  Laterality: Left;  . SHOULDER ARTHROSCOPY Left 11/14/2016   Procedure: ARTHROSCOPY SHOULDER WITH LIMITED DEDBRIDEMENT AND ACROMIOPLASTY AND DISTAL CLAVICAL EXCISION;  Surgeon: Vickki HearingHarrison, Stanley E, MD;  Location: [facility];  Service: Orthopedics;  Laterality: Left;  . TOTAL KNEE ARTHROPLASTY Left 09/14/2014   Procedure: LEFT TOTAL KNEE ARTHROPLASTY;  Surgeon: Vickki HearingStanley E Harrison, MD;   Location: [facility];  Service: Orthopedics;  Laterality: Left;    There were no vitals filed for this visit.  Subjective Assessment - 04/08/17 1612    Subjective  Patient feels good and states she is not having difficulty at home. She states she is not tired from her OT/SLP session.    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 Also milde hydrocephalus.     Patient Stated Goals  Patient states she would like to focus on balance and decrease pain in groin    Currently in Pain?  No/denies       [facility] Treatment/Exercise - 04/08/17 1610      Balance Poses: Yoga   Warrior I  Limitations;2 reps;30 seconds    Warrior I Limitations  2x 30 seconds alternating foot position, hands in front at 90* flexion rather than overhead    Warrior II  1 rep;30 seconds;Limitations  Warrior II Limitations  alternated foot position, moved into warrior II after 2nd warrior I pose for continual 1 minute balance activity       Balance Exercises - 04/08/17 1603      Balance Exercises: Standing   Tandem Stance  Eyes open;Foam/compliant surface;Limitations 2# rod push press for UE flexion, 1x 15 alt foot position    Balance Master: Limits for Stability  2:30; 50% of time remained within circle A/50% in circle B; level 6 progressed to 4    Balance Master: Dynamic  3:51 to hit all targets, level 6 for stability, moderate challenge setting    Balance Beam  tandem walking 4x on balance beam with 4 low hurdles step over, 2x both directiosn for sidestepping on baalnce beam        PT Education - 04/08/17 1613    Education provided   Yes    Education Details  Educated on exercise form/technique throughout session and educated on weight shifting strategies during balance activities.    Person(s) Educated  Patient;Spouse    Methods  Explanation    Comprehension  Verbalized understanding;Returned demonstration       PT Short Term Goals - 03/17/17 1607      PT SHORT TERM GOAL #1   Title  Patient and caregiver will report understanding and regular compliance with HEP.     Baseline  Educated about plans for therapy    Time  2    Period  Weeks    Status  Achieved      PT SHORT TERM GOAL #2   Title  Patient will demonstrate ability to perform single limb stance independently for 2 seconds bilaterally in order to help with stair ambulation.     Baseline  Patient is unable to perform single limb stance without assistance.     Time  2    Period  Weeks    Status  Achieved      PT SHORT TERM GOAL #3   Title  Patient will demonstrate improvement on Berg balance test of 5 points in order to improve balance and reduce risk of falls.     Baseline  Berg score: 38    Time  2    Period  Weeks    Status  On-going        PT Long Term Goals - 03/17/17 1608      PT LONG TERM GOAL #1   Title  Patient will demonstrate improvement in berg balance test of 8 points indicating improved balance and decreased risk of falls.     Baseline  Berg score: 38    Time  4    Period  Weeks    Status  On-going      PT LONG TERM GOAL #2   Title  Patient will demonstrate ability to perform single limb stance for 5 seconds independently bilaterally in order to assist with navigation in the environment and with stair navigation.     Baseline  able to stand x 10 seconds B today will change to 30 seconds     Time  4    Period  Weeks    Status  Revised      PT LONG TERM GOAL #3   Title  Patient will demonstrate improved gait mechanics with decreased hip drop and increased stride length with 50% of gait in order to improve functional mobility.      Baseline  Patient demonstrates hip drop and decreased stride length 100% of  time with gait.     Time  4    Period  Weeks    Status  On-going      PT LONG TERM GOAL #4   Title  Patient's FOTO score will improve by 10% indicating improvement in functional mobility.     Baseline  FOTO score: 46% limited    Time  4    Period  Weeks    Status  On-going        Plan - 04/08/17 1517    Clinical Impression Statement  Patient is progressing well in therapy and has continued with higher-level balance challenges with good activity tolerance. She continues to require minimal assist to prevent LOB with dynamic gait challenges on balance beam and with Biodex/balance master. She advanced LE endurance training and balance activity with initiation of yoga poses with no LOB. She will continue to benefit from skilled PT interventions to address current impairments and progress towards goals to improve independence and QOL.     Rehab Potential  Good    Clinical Impairments Affecting Rehab Potential  pain; strength limitations in lower extremities; balance difficulties    PT Frequency  2x / week    PT Duration  4 weeks    PT Treatment/Interventions  ADLs/Self Care Home Management;DME Instruction;Gait training;Stair training;Functional mobility training;Therapeutic activities;Therapeutic exercise;Balance training;Neuromuscular re-education;Patient/family education;Manual techniques;Passive range of motion;Energy conservation;Cryotherapy;Electrical Stimulation;Moist Heat;Ultrasound    PT Next Visit Plan  Re-assess next session. Continue with Biodex and yoga poses. Continue with dual task gait challenges.    PT Home Exercise Plan  03/20/17: sit to stand, tandem stance and SLS    Consulted and Agree with Plan of Care  Patient       Patient will benefit from skilled therapeutic intervention in order to improve the following deficits and impairments:  Abnormal gait, Decreased activity tolerance, Decreased  cognition, Decreased endurance, Decreased range of motion, Decreased strength, Increased fascial restricitons, Improper body mechanics, Pain, Decreased balance, Decreased coordination, Decreased mobility, Decreased safety awareness, Difficulty walking, Postural dysfunction  Visit Diagnosis: Other abnormalities of gait and mobility  Balance problems  Decreased strength of lower extremity     Problem List Patient Active Problem List   Diagnosis Date Noted  . Arthrosis of left acromioclavicular joint   . Rotator cuff syndrome of left shoulder   . Primary osteoarthritis of left shoulder   . Surgical aftercare, musculoskeletal system 10/11/2014  . Status post total left knee replacement 10/11/2014  . Arthritis of left knee 09/14/2014  . Knee contusion 12/27/2013  . Medial meniscus, posterior horn derangement 12/27/2013  . Arthritis of knee, left 12/27/2013  . Left knee pain 12/27/2013  . 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 KNEE PAIN 05/11/2007  . DISC DEGENERATION 05/11/2007  . SCIATICA 05/11/2007    Valentino Saxon, PT, DPT Physical Therapist with [facility]  04/08/2017 4:19 PM    [facility] [address] [place], [place], [address] Phone: [phone]   Fax:  [phone]  Name: Alyssa Poole MRN: [id number] Date of Birth: [birth year]

## 2017-04-08 NOTE — Therapy (Signed)
[facility] [address] [place], [place], [address] Phone: [phone]   Fax:  [phone]  Speech Language Pathology Treatment  Patient Details  Name: Alyssa Poole MRN: [id number] Date of Birth: [birth year] Referring Provider: Renato Gails, MD   Encounter Date: 04/08/2017  End of Session - 04/08/17 1508    Visit Number  5    Number of Visits  9    Date for SLP Re-Evaluation  04/15/17    Authorization Type  Medicare    Authorization Time Period  before 10th visit    SLP Start Time  1437    SLP Stop Time   1515    SLP Time Calculation (min)  38 min    Activity Tolerance  Patient tolerated treatment well       Past Medical History:  Diagnosis Date  . Anxiety   . Arthritis   . Asthma   . Chronic headaches   . Chronic neck pain   . Depression 2002  . Dysconjugate gaze   . GERD (gastroesophageal reflux disease)   . Hypertension   . Neuromuscular disorder (HCC)   . Osteoporosis   . Panic attacks 2002    Past Surgical History:  Procedure Laterality Date  . BACK SURGERY     x4  . CERVICAL SPINE SURGERY     plate in neck  . CHOLECYSTECTOMY    . KNEE ARTHROSCOPY WITH MEDIAL MENISECTOMY Left 03/09/2014   Procedure: LEFT KNEE ARTHROSCOPY WITH PARTIAL MEDIAL MENISECTOMY;  Surgeon: Vickki Hearing, MD;  Location: [facility];  Service: Orthopedics;  Laterality: Left;  . SHOULDER ARTHROSCOPY Left 11/14/2016   Procedure: ARTHROSCOPY SHOULDER WITH LIMITED DEDBRIDEMENT AND ACROMIOPLASTY AND DISTAL CLAVICAL EXCISION;  Surgeon: Vickki Hearing, MD;  Location: [facility];  Service: Orthopedics;  Laterality: Left;  . TOTAL KNEE ARTHROPLASTY Left 09/14/2014   Procedure: LEFT TOTAL KNEE ARTHROPLASTY;  Surgeon: Vickki Hearing, MD;  Location: [facility];  Service: Orthopedics;  Laterality: Left;    There were no vitals filed for this visit.  Subjective Assessment - 04/08/17 1453    Subjective  "Good!"    Patient is accompained by:  Family  member    Currently in Pain?  No/denies       ADULT SLP TREATMENT - 04/08/17 0001      General Information   Behavior/Cognition  Alert;Cooperative;Pleasant mood    Patient Positioning  Upright in chair    Oral care provided  N/A    HPI  Alyssa Poole is a 57 y/o right hand dominant female with PMHx significant for left eye blindness, hypertension, anxiety, chronic headaches who was admitted to [facility] on 01/29/2017 from an outside hospital with subarachnoid hemorrhage.  CT at outside hospital revealed large amount of acute subarachnoid hemorrhage in the basilar cisterns with small amount of hemorrhage in the fourth ventricle and mild hydrocephalus.  Right EVD was placed by neurosurgery.  Patient underwent cerebral arteriogram by Dr. Thedore Mins.  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  Dissection was treated with overlapping stents.  No definitive aneurysm or AVM was identified at that time.  In the ICU, the patient self extubated and partially removed her EVD.  The patient's ICU course was further complicated by fever.  Infectious workup was negative and the patient was treated empirically with vancomycin and cefepime initially.  Ultimately fever was attributed to neurogenic cause.  The patient's ICU course was further complicated by agitation which was treated with  Precedex, anemia which was treated with transfusion, hypokalemia, cerebral salt wasting, and repeated bolus requirements to maintain euvolemia.  The patient underwent repeat cerebral arteriogram on 02/05/2017.  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  There were no aneurysm or other vascular malformations to explain her subarachnoid hemorrhage at that time.  She was admitted to inpatient rehabilitation for comprehensive therapy and continued medical management on 02/19/17 and eventually discharged home with family on 03/07/2017. Dr. Cephus RicherAguilar has  referred [name] for outpatient SLP, OT, and PT.        Treatment Provided   Treatment provided  Cognitive-Linquistic      Pain Assessment   Pain Assessment  No/denies pain      Cognitive-Linquistic Treatment   Treatment focused on  Cognition;Patient/family/caregiver education;Aphasia    Skilled Treatment  memory strategies, visualization strategy related to describing home layout, providing written information      Assessment / Recommendations / Plan   Plan  Continue with current plan of care         SLP Short Term Goals - 04/08/17 1522      SLP SHORT TERM GOAL #1   Title  Pt will complete functional memory activities (mod level) with 90% acc when given mild/mod cues and use of memory strategies.    Baseline  25% independently; 80% with mod/max cues    Time  4    Period  Weeks    Status  On-going      SLP SHORT TERM GOAL #2   Title  Pt will complete selective and alternating attention tasks (moderately complex) with 80% acc and min cues.    Baseline  50%    Time  4    Period  Weeks    Status  On-going      SLP SHORT TERM GOAL #3   Title  Pt will describe objects and pictures by providing at least three salient features as judged by clinician with 90% acc when provided mi/mod cues.     Baseline  mod assist    Time  4    Period  Weeks    Status  On-going      SLP SHORT TERM GOAL #4   Title  Pt will increase divergent naming to 5 items per concrete category with mod cues.    Baseline  2 items    Time  4    Period  Weeks    Status  On-going       SLP Long Term Goals - 04/08/17 1523      SLP LONG TERM GOAL #1   Title  Pt will increase memory skills to [facility] with use of compensatory strategies as needed.     Baseline  min to mi/mod impairment    Time  2    Period  Months    Status  On-going      SLP LONG TERM GOAL #2   Title  Pt will increase verbal expression skills to [facility] with use of strategies as needed.    Baseline  min to mi/mod impairment    Time  2     Period  Months    Status  On-going       Plan - 04/08/17 1522    Clinical Impression Statement Pt and spouse continue to report improvements noted at home. Pt is becoming increasingly independent with cognitive linguistic tasks. Word finding and verbal expression tasks were targeted  during functional tasks while incorporating spouse's involvement. Pt required min/mod cues to verbally describe pictured items by giving salient features. She benefited from probe questions and cues to refer to list of word finding strategies. She generally was able to give 2-3 descriptives, but needed assist for most important features. Pt and spouse feel good about Pt progress and both agree that she is close to baseline. Anticipate d/c from SLP services in next 1-2 visits.    Speech Therapy Frequency  2x / week    Duration  4 weeks    Treatment/Interventions  Patient/family education;Cueing hierarchy;Cognitive reorganization;Multimodal communcation approach;SLP instruction and feedback;Internal/external aids;Compensatory techniques;Language facilitation    Potential to Achieve Goals  Good    Potential Considerations  Previous level of function    SLP Home Exercise Plan  Pt will completed HEP as assigned to facilitate carryover of treatment strategies and techniques in home environment with assist from caregiver as needed.    Consulted and Agree with Plan of Care  Patient;Family member/caregiver    Family Member Consulted  Significant other, Kaysia Willard       Patient will benefit from skilled therapeutic intervention in order to improve the following deficits and impairments:   Cognitive communication deficit    Problem List Patient Active Problem List   Diagnosis Date Noted  . Arthrosis of left acromioclavicular joint   . Rotator cuff syndrome of left shoulder   . Primary osteoarthritis of left shoulder   . Surgical aftercare, musculoskeletal system 10/11/2014  . Status post total left knee  replacement 10/11/2014  . Arthritis of left knee 09/14/2014  . Knee contusion 12/27/2013  . Medial meniscus, posterior horn derangement 12/27/2013  . Arthritis of knee, left 12/27/2013  . Left knee pain 12/27/2013  . 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 KNEE PAIN 05/11/2007  . DISC DEGENERATION 05/11/2007  . SCIATICA 05/11/2007   Thank you,  Havery Moros, CCC-SLP [phone]  Havery Moros 04/08/2017, 3:24 PM  [facility] [address] [place], [place], [address] Phone: [phone]   Fax:  [phone]   Name: REET SCHARRER MRN: [id number] Date of Birth: 03-08-1961

## 2017-04-09 ENCOUNTER — Ambulatory Visit (INDEPENDENT_AMBULATORY_CARE_PROVIDER_SITE_OTHER): Payer: Medicare Other | Admitting: Orthopedic Surgery

## 2017-04-09 ENCOUNTER — Ambulatory Visit (INDEPENDENT_AMBULATORY_CARE_PROVIDER_SITE_OTHER): Payer: Medicare Other

## 2017-04-09 ENCOUNTER — Encounter: Payer: Self-pay | Admitting: Orthopedic Surgery

## 2017-04-09 VITALS — BP 118/63 | HR 62 | Ht 62.0 in | Wt 125.0 lb

## 2017-04-09 DIAGNOSIS — M79671 Pain in right foot: Secondary | ICD-10-CM

## 2017-04-09 DIAGNOSIS — M84374A Stress fracture, right foot, initial encounter for fracture: Secondary | ICD-10-CM | POA: Diagnosis not present

## 2017-04-09 NOTE — Progress Notes (Signed)
Progress Note   Patient ID: Alyssa Poole, female   DOB: 26-May-1960, 57 y.o.   MRN: [id number]  Chief Complaint  Patient presents with  . Foot Pain    right foot painful and swollen     57 year old female presents for evaluation of pain in her right foot  She has a history of right fourth metatarsal stress fracture was treated with immobilization  She comes in today complaining of 4-32-month history of MODERATE DULL NON RADIATING dorsal foot pain which is increased with ambulation and associated with swelling of the dorsal aspect of the right foot     Review of Systems  Constitutional: Negative for chills, fever, malaise/fatigue and weight loss.  Musculoskeletal: Positive for myalgias.  Skin: Negative for itching and rash.  Neurological: Negative for sensory change.   Current Meds  Medication Sig  . alendronate (FOSAMAX) 70 MG tablet Take 70 mg by mouth every 7 (seven) days. Takes on Sunday. Take with a full glass of water on an empty stomach.  Marland Kitchen aspirin EC 81 MG tablet Take 81 mg by mouth daily.  . Black Cohosh 540 MG CAPS Take 1 capsule by mouth daily.  . cetirizine (ZYRTEC) 10 MG tablet Take 10 mg by mouth daily.  . citalopram (CELEXA) 20 MG tablet Take 20 mg by mouth daily.  . clopidogrel (PLAVIX) 300 MG TABS tablet Take 300 mg by mouth once.  . [employer] CALCIUM 600+D 600-800 MG-UNIT TABS Take 1 tablet by mouth daily.  Marland Kitchen gabapentin (NEURONTIN) 300 MG capsule Take 300 mg by mouth 3 (three) times daily.   . metoprolol tartrate (LOPRESSOR) 25 MG tablet TK 1 T PO BID  . Multiple Vitamin (MULTIVITAMIN WITH MINERALS) TABS tablet Take 1 tablet by mouth daily.  . naproxen (NAPROSYN) 500 MG tablet Take 500 mg by mouth 2 (two) times daily with a meal.    Past Medical History:  Diagnosis Date  . Anxiety   . Arthritis   . Asthma   . Chronic headaches   . Chronic neck pain   . Depression 2002  . Dysconjugate gaze   . GERD (gastroesophageal reflux disease)   . Hypertension   .  Neuromuscular disorder (HCC)   . Osteoporosis   . Panic attacks 2002     Allergies  Allergen Reactions  . Bee Venom Swelling    Causes bad swelling. No epipen  needed    BP 118/63   Pulse 62   Ht 5\' 2"  (1.575 m)   Wt 125 lb (56.7 kg)   BMI 22.86 kg/m    Physical Exam  Constitutional: She is oriented to person, place, and time. She appears well-developed and well-nourished.  Musculoskeletal:       Right ankle: She exhibits swelling. She exhibits normal range of motion, no ecchymosis, no deformity, no laceration and normal pulse. No tenderness. No lateral malleolus, no medial malleolus, no AITFL, no CF ligament, no posterior TFL, no head of 5th metatarsal and no proximal fibula tenderness found. Achilles tendon exhibits no pain, no defect and normal Thompson's test results.       Right foot: There is tenderness, bony tenderness and swelling. There is no deformity.       Left foot: There is normal range of motion, no tenderness, no bony tenderness, no swelling, normal capillary refill, no crepitus, no deformity and no laceration.       Feet:  Neurological: She is alert and oriented to person, place, and time. She has normal strength. She displays  no atrophy and no tremor. No sensory deficit. She exhibits normal muscle tone. She displays no seizure activity. Coordination and gait normal.  Reflex Scores:      Patellar reflexes are 2+ on the right side and 2+ on the left side.      Achilles reflexes are 2+ on the right side and 2+ on the left side. Psychiatric: She has a normal mood and affect. Judgment normal.  Vitals reviewed.   Ortho Exam   Medical decision-making  Imaging: RIGHT FOOT   Prior images show fracture of the fourth metatarsal near the proximal aspect of the shaft with sclerotic bone edges and incomplete healing \ New x-ray taken today [facility] lateral and oblique of the right foot  See dictated report fracture still visible on x-ray  Encounter Diagnoses  Name  Primary?  . Pain in right foot Yes  . Stress fracture of right foot, initial encounter     CAST RIGHT FOOT   6 WEEKS CAST OFF XRAY      Fuller CanadaStanley Amilliana Hayworth, MD 04/09/2017 2:46 PM

## 2017-04-10 ENCOUNTER — Encounter (HOSPITAL_COMMUNITY): Payer: Self-pay

## 2017-04-10 ENCOUNTER — Ambulatory Visit (HOSPITAL_COMMUNITY): Payer: Medicare Other

## 2017-04-10 ENCOUNTER — Telehealth: Payer: Self-pay | Admitting: Radiology

## 2017-04-10 ENCOUNTER — Encounter (HOSPITAL_COMMUNITY): Payer: Self-pay | Admitting: Speech Pathology

## 2017-04-10 ENCOUNTER — Ambulatory Visit (HOSPITAL_COMMUNITY): Payer: Medicare Other | Admitting: Speech Pathology

## 2017-04-10 ENCOUNTER — Other Ambulatory Visit: Payer: Self-pay

## 2017-04-10 ENCOUNTER — Ambulatory Visit (HOSPITAL_COMMUNITY): Payer: Medicare Other | Admitting: Occupational Therapy

## 2017-04-10 DIAGNOSIS — R41841 Cognitive communication deficit: Secondary | ICD-10-CM | POA: Diagnosis not present

## 2017-04-10 DIAGNOSIS — R2689 Other abnormalities of gait and mobility: Secondary | ICD-10-CM

## 2017-04-10 DIAGNOSIS — M25512 Pain in left shoulder: Secondary | ICD-10-CM

## 2017-04-10 DIAGNOSIS — R29898 Other symptoms and signs involving the musculoskeletal system: Secondary | ICD-10-CM

## 2017-04-10 NOTE — Telephone Encounter (Signed)
Alyssa Poole from physical therapy has asked for patients WB status in the cast.   She is s/p CVA will be hard for her to staff off of the foot. Please advise.

## 2017-04-10 NOTE — Patient Instructions (Signed)
   STRAIGHT LEG RAISE - SLR: 1-2 sets of 10-15 repetitions  While lying on your back, raise up your leg with a straight knee.  Keep the opposite knee bent with the foot planted on the ground.     SEATED MARCHING: 1-2 sets of 15 repetitions, hold knee up for 3-5 seconds  While seated in a chair, lift up your foot and knee, set it down and then perform on the other leg. Repeat this alternating movement.

## 2017-04-10 NOTE — Telephone Encounter (Signed)
I called Dr. Mort SawyersHarrison's office to speak with him about a patient he referred, Alyssa Poole. He placed a cast on her right foot yesterday and the patient is unclear about her weight bearing status. Amy answered my called and then let me know she would call me back to let me know what Dr. Mort SawyersHarrison's precautions are.  Valentino Saxonachel Quinn-Brown, PT, DPT Physical Therapist with [facility]  04/10/2017 3:13 PM

## 2017-04-10 NOTE — Therapy (Signed)
[facility] [address] [place], [place], [address] Phone: [phone]   Fax:  [phone]  Occupational Therapy Reassessment and Treatment (recertification)  Patient Details  Name: Alyssa Poole MRN: [id number] Date of Birth: 1960-04-28 Referring Provider: Renato Gails MD   Encounter Date: 04/10/2017  OT End of Session - 04/10/17 1542    Visit Number  6    Number of Visits  14    Date for OT Re-Evaluation  05/10/17    Authorization Type  1) Medicare 2) Medicaid    Authorization Time Period  before 10th visit    Authorization - Visit Number  6    Authorization - Number of Visits  10    OT Start Time  1346    OT Stop Time  1430    OT Time Calculation (min)  44 min    Activity Tolerance  Patient tolerated treatment well    Behavior During Therapy  [facility] for tasks assessed/performed       Past Medical History:  Diagnosis Date  . Anxiety   . Arthritis   . Asthma   . Chronic headaches   . Chronic neck pain   . Depression 2002  . Dysconjugate gaze   . GERD (gastroesophageal reflux disease)   . Hypertension   . Neuromuscular disorder (HCC)   . Osteoporosis   . Panic attacks 2002    Past Surgical History:  Procedure Laterality Date  . BACK SURGERY     x4  . CERVICAL SPINE SURGERY     plate in neck  . CHOLECYSTECTOMY    . KNEE ARTHROSCOPY WITH MEDIAL MENISECTOMY Left 03/09/2014   Procedure: LEFT KNEE ARTHROSCOPY WITH PARTIAL MEDIAL MENISECTOMY;  Surgeon: Vickki Hearing, MD;  Location: [facility];  Service: Orthopedics;  Laterality: Left;  . SHOULDER ARTHROSCOPY Left 11/14/2016   Procedure: ARTHROSCOPY SHOULDER WITH LIMITED DEDBRIDEMENT AND ACROMIOPLASTY AND DISTAL CLAVICAL EXCISION;  Surgeon: Vickki Hearing, MD;  Location: [facility];  Service: Orthopedics;  Laterality: Left;  . TOTAL KNEE ARTHROPLASTY Left 09/14/2014   Procedure: LEFT TOTAL KNEE ARTHROPLASTY;  Surgeon: Vickki Hearing, MD;  Location: [facility];  Service:  Orthopedics;  Laterality: Left;    There were no vitals filed for this visit.  Subjective Assessment - 04/10/17 1541    Subjective   S: My shoulder is really hurting today.     Currently in Pain?  Yes    Pain Score  5     Pain Location  Shoulder    Pain Orientation  Left    Pain Descriptors / Indicators  Aching;Sore    Pain Type  Acute pain    Pain Radiating Towards  n/a    Pain Onset  More than a month ago    Pain Frequency  Constant    Aggravating Factors   use of left arm    Pain Relieving Factors  rest    Effect of Pain on Daily Activities  moderate effect on ADL completion    Multiple Pain Sites  No         [facility] OT Assessment - 04/10/17 1350      Assessment   Medical Diagnosis  BUE weakness      Precautions   Precautions  Other (comment)    Precaution Comments  Low vision- left eye blind      Strength   Right Shoulder Flexion  4/5 same as previous    Right Shoulder ABduction  4/5 same as  previous    Right Shoulder Internal Rotation  4+/5 4/5 previous    Right Shoulder External Rotation  4+/5 4/5 previous    Left Shoulder Flexion  4/5 4-/5 previous    Left Shoulder ABduction  4/5 4-/5 previous    Left Shoulder Internal Rotation  4+/5 same as previous    Left Shoulder External Rotation  4+/5 same as previous    Right Hand Grip (lbs)  45 41 previous    Right Hand Lateral Pinch  10 lbs same as previous    Right Hand 3 Point Pinch  11 lbs same as previous    Left Hand Grip (lbs)  33 27 previous    Left Hand Lateral Pinch  12 lbs same as previous    Left Hand 3 Point Pinch  9 lbs 8 previous               OT Treatments/Exercises (OP) - 04/10/17 1355      Exercises   Exercises  Shoulder;Hand      Shoulder Exercises: Supine   Protraction  PROM;5 reps    Horizontal ABduction  PROM;5 reps    External Rotation  PROM;5 reps    Internal Rotation  PROM;5 reps    Flexion  PROM;5 reps    ABduction  PROM;5 reps      Shoulder Exercises: Seated   Extension   Theraband;10 reps    Theraband Level (Shoulder Extension)  Level 2 (Red)    Row  Theraband;10 reps    Theraband Level (Shoulder Row)  Level 2 (Red)    Protraction  Strengthening;10 reps    Protraction Weight (lbs)  1    Horizontal ABduction  Strengthening;10 reps    Horizontal ABduction Weight (lbs)  1    External Rotation  Strengthening;10 reps    External Rotation Weight (lbs)  1    Internal Rotation  Strengthening;10 reps    Internal Rotation Weight (lbs)  1    Flexion Limitations  unable to complete due to pain    ABduction Limitations  unable to complete due to pain      Shoulder Exercises: ROM/Strengthening   UBE (Upper Arm Bike)  Level 1 1' forward 1' reverse    X to V Arms  10X    Proximal Shoulder Strengthening, Seated  10X each 1 rest break      Manual Therapy   Manual Therapy  Myofascial release    Manual therapy comments  Manual therapy completed prior to exercises.     Myofascial Release  Myofascial release and manual stretching completed to left upper arm, trapezius, and scapularis region to decrease fascial restrictions and increase joint mobility in a pain free zone.                OT Short Term Goals - 04/10/17 1355      OT SHORT TERM GOAL #1   Title  Pt will be educated and independent with HEP to increase functional performance and endurance during ADL tasks.    Time  4    Period  Weeks    Status  On-going      OT SHORT TERM GOAL #2   Title  Patient will increase her grip and pinch strength to be within average norms in order to complete gripping tasks such as opening containers and jars with less difficulty.     Time  4    Period  Weeks    Status  On-going  OT SHORT TERM GOAL #3   Title  Patient will increase her BUE strength to 5/5 to increase functional performance during daily tasks.     Time  4    Period  Weeks    Status  On-going      OT SHORT TERM GOAL #4   Title  Patient will decrease fascial restrictions to min amount to increase  functional mobility and comfort level when completing functional daily tasks.     Time  4    Period  Weeks    Status  New               Plan - 04/10/17 1542    Clinical Impression Statement  A: Reassessment completed this session, pt is making slow, steady progress towards goals. Pt has made gains with strength in BUE and bilateral grip strength. This session worked on [employer] Corporation[employer]UE strengthening, LUE pain limiting completion of strengthening exercises.     Occupational Profile and client history currently impacting functional performance  motiviated to return to prior level of function, strong social support at home.     Rehab Potential  Excellent    Current Impairments/barriers affecting progress:  Hx of left shoulder arthroscopy 11/14/16    OT Frequency  2x / week    OT Duration  4 weeks    OT Treatment/Interventions  Self-care/ADL training;Ultrasound;DME and/or AE instruction;Patient/family education;Therapeutic exercise;Manual Therapy;Therapeutic activities;Electrical Stimulation;Cryotherapy;Passive range of motion    Plan  P: Continue with skilled OT services focusing on BUE strength and functional task completion. Next session: Resume grip and pinch strengthening exercises       Patient will benefit from skilled therapeutic intervention in order to improve the following deficits and impairments:  Pain, Impaired UE functional use, Increased fascial restrictions, Decreased strength  Visit Diagnosis: Other symptoms and signs involving the musculoskeletal system  Acute pain of left shoulder    Problem List Patient Active Problem List   Diagnosis Date Noted  . Arthrosis of left acromioclavicular joint   . Rotator cuff syndrome of left shoulder   . Primary osteoarthritis of left shoulder   . Surgical aftercare, musculoskeletal system 10/11/2014  . Status post total left knee replacement 10/11/2014  . Arthritis of left knee 09/14/2014  . Knee contusion 12/27/2013  . Medial  meniscus, posterior horn derangement 12/27/2013  . Arthritis of knee, left 12/27/2013  . Left knee pain 12/27/2013  . 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 KNEE PAIN 05/11/2007  . DISC DEGENERATION 05/11/2007  . SCIATICA 05/11/2007   Ezra SitesLeslie Sadiya Durand, OTR/L  [phone] 04/10/2017, 4:42 PM  [facility] [address] [place], [name], [address] Phone: [phone]   Fax:  [phone]  Name: Alyssa Poole MRN: [id number] Date of Birth: Feb 01, 1961

## 2017-04-10 NOTE — Therapy (Signed)
Alyssa Poole, [place], [address] Phone: [phone]   Fax:  [phone]  Speech Language Pathology Treatment  Patient Details  Name: Alyssa Poole MRN: [id number] Date of Birth: April 01, 1961 Referring Provider: Levada Schilling, Poole   Encounter Date: 04/10/2017  End of Session - 04/10/17 1505    Visit Number  6    Number of Visits  9    Date for SLP Re-Evaluation  04/15/17    Authorization Type  Medicare    Authorization Time Period  before 10th visit    SLP Start Time  1436    SLP Stop Time   1515    SLP Time Calculation (min)  39 min    Activity Tolerance  Patient tolerated treatment well       Past Medical History:  Diagnosis Date  . Anxiety   . Arthritis   . Asthma   . Chronic headaches   . Chronic neck pain   . Depression 2002  . Dysconjugate gaze   . GERD (gastroesophageal reflux disease)   . Hypertension   . Neuromuscular disorder ([place])   . Osteoporosis   . Panic attacks 2002    Past Surgical History:  Procedure Laterality Date  . BACK SURGERY     x4  . CERVICAL SPINE SURGERY     plate in neck  . CHOLECYSTECTOMY    . KNEE ARTHROSCOPY WITH MEDIAL MENISECTOMY Left 03/09/2014   Procedure: LEFT KNEE ARTHROSCOPY WITH PARTIAL MEDIAL MENISECTOMY;  Surgeon: Alyssa Civil, Poole;  Location: [facility];  Service: Orthopedics;  Laterality: Left;  . SHOULDER ARTHROSCOPY Left 11/14/2016   Procedure: ARTHROSCOPY SHOULDER WITH LIMITED DEDBRIDEMENT AND ACROMIOPLASTY AND DISTAL CLAVICAL EXCISION;  Surgeon: Alyssa Civil, Poole;  Location: [facility];  Service: Orthopedics;  Laterality: Left;  . TOTAL KNEE ARTHROPLASTY Left 09/14/2014   Procedure: LEFT TOTAL KNEE ARTHROPLASTY;  Surgeon: Alyssa Civil, Poole;  Location: [facility];  Service: Orthopedics;  Laterality: Left;    There were no vitals filed for this visit.  Subjective Assessment - 04/10/17 1458    Subjective  "I can do it all."    Patient is accompained  by:  Family member    Currently in Pain?  No/denies       ADULT SLP TREATMENT - 04/10/17 0001      General Information   Behavior/Cognition  Alert;Cooperative;Pleasant mood    Patient Positioning  Upright in chair    Oral care provided  N/A    HPI  Alyssa Poole is a 57 y/o right hand dominant female with PMHx significant for left eye blindness, hypertension, anxiety, chronic headaches who was admitted to [facility] on 01/29/2017 from an outside hospital with subarachnoid hemorrhage.  CT at outside hospital revealed large amount of acute subarachnoid hemorrhage in the basilar cisterns with small amount of hemorrhage in the fourth ventricle and mild hydrocephalus.  Right EVD was placed by neurosurgery.  Patient underwent cerebral arteriogram by Dr. Candiss Poole.  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  Dissection was treated with overlapping stents.  No definitive aneurysm or AVM was identified at that time.  In the ICU, the patient self extubated and partially removed her EVD.  The patient's ICU course was further complicated by fever.  Infectious workup was negative and the patient was treated empirically with vancomycin and cefepime initially.  Ultimately fever was attributed to neurogenic cause.  The patient's ICU course was further complicated by agitation  which was treated with Precedex, anemia which was treated with transfusion, hypokalemia, cerebral salt wasting, and repeated bolus requirements to maintain euvolemia.  The patient underwent repeat cerebral arteriogram on 02/05/2017.  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  There were no aneurysm or other vascular malformations to explain her subarachnoid hemorrhage at that time.  She was admitted to inpatient rehabilitation for comprehensive therapy and continued medical management on 02/19/17 and eventually discharged home with family on 03/07/2017. Dr.  Pricilla Poole has referred Alyssa Poole for outpatient SLP, OT, and PT.        Treatment Provided   Treatment provided  Cognitive-Linquistic      Pain Assessment   Pain Assessment  No/denies pain      Cognitive-Linquistic Treatment   Treatment focused on  Cognition;Patient/family/caregiver education;Aphasia    Skilled Treatment  memory strategies, visualization strategy related to describing home layout, providing written information      Assessment / Recommendations / Plan   Plan  Continue with current plan of care         SLP Short Term Goals - 04/10/17 1507      SLP SHORT TERM GOAL #1   Title  Pt will complete functional memory activities (mod level) with 90% acc when given mild/mod cues and use of memory strategies.    Baseline  25% independently; 80% with mod/max cues    Time  4    Period  Weeks    Status  Achieved      SLP SHORT TERM GOAL #2   Title  Pt will complete selective and alternating attention tasks (moderately complex) with 80% acc and min cues.    Baseline  50%    Time  4    Period  Weeks    Status  Achieved      SLP SHORT TERM GOAL #3   Title  Pt will describe objects and pictures by providing at least three salient features as judged by clinician with 90% acc when provided mi/mod cues.     Baseline  mod assist    Time  4    Period  Weeks    Status  Achieved      SLP SHORT TERM GOAL #4   Title  Pt will increase divergent naming to 5 items per concrete category with mod cues.    Baseline  2 items    Time  4    Period  Weeks    Status  Achieved       SLP Long Term Goals - 04/10/17 1507      SLP LONG TERM GOAL #1   Title  Pt will increase memory skills to [facility] with use of compensatory strategies as needed.     Baseline  min to mi/mod impairment    Time  2    Period  Months    Status  Achieved      SLP LONG TERM GOAL #2   Title  Pt will increase verbal expression skills to [facility] with use of strategies as needed.    Baseline  min to mi/mod impairment     Time  2    Period  Months    Status  Achieved       Plan - 04/10/17 1506    Clinical Impression Statement Pt and spouse report that Pt is functioning at baseline in regards to cognitive linguistic skills. Pt still exhibits mild expressive language difficulties  when answering complex open ended questions, however this may be consistent with her baseline per pt/family. Pt encouraged to utilize word finding strategies during conversations with a focus on answering "who, what, when, where, why". Pt and spouse encouraged to have Pt provide short summary of television programs watched together to continue targeting expressive language skills at home. Will discharge from SLP services at this time.    Speech Therapy Frequency  2x / week    Duration  4 weeks    Treatment/Interventions  Patient/family education;Cueing hierarchy;Cognitive reorganization;Multimodal communcation approach;SLP instruction and feedback;Internal/external aids;Compensatory techniques;Language facilitation    Potential to Achieve Goals  Good    Potential Considerations  Previous level of function    SLP Home Exercise Plan  Pt will completed HEP as assigned to facilitate carryover of treatment strategies and techniques in home environment with assist from caregiver as needed.    Consulted and Agree with Plan of Care  Patient;Family member/caregiver    Family Member Consulted  Significant other, Shanice Poznanski       Patient will benefit from skilled therapeutic intervention in order to improve the following deficits and impairments:   Cognitive communication deficit    Problem List Patient Active Problem List   Diagnosis Date Noted  . Arthrosis of left acromioclavicular joint   . Rotator cuff syndrome of left shoulder   . Primary osteoarthritis of left shoulder   . Surgical aftercare, musculoskeletal system 10/11/2014  . Status post total left knee replacement 10/11/2014  . Arthritis of left knee 09/14/2014  . Knee  contusion 12/27/2013  . Medial meniscus, posterior horn derangement 12/27/2013  . Arthritis of knee, left 12/27/2013  . Left knee pain 12/27/2013  . 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 KNEE PAIN 05/11/2007  . [place] DEGENERATION 05/11/2007  . SCIATICA 05/11/2007   SPEECH THERAPY DISCHARGE SUMMARY  Visits from Start of Care: 6  Current functional level related to goals / functional outcomes: Pt met all short term goals   Remaining deficits: Mild expressive language deficits, which may be Pt's baseline per Pt and spouse   Education / Equipment: HEP completed  Plan: Patient agrees to discharge.  Patient goals were met. Patient is being discharged due to meeting the stated rehab goals.  ?????         Thank you,  Genene Churn, [place]  [facility] 04/10/2017, 3:34 PM  [place] [address] [place], [place], [address] Phone: [phone]   Fax:  [phone]   Name: Alyssa Poole MRN: [id number] Date of Birth: 12-02-1960

## 2017-04-10 NOTE — Therapy (Signed)
[place], [place], [address] Phone: [phone]   Fax:  [phone]  Physical Therapy Treatment/Re-Assessment  Patient Details  Name: Alyssa Poole MRN: [id number] Date of Birth: [birth year] Referring Provider: Levada Schilling MD   Encounter Date: 04/10/2017  PT End of Session - 04/10/17 1554    Visit Number  7    Number of Visits  17    Date for PT Re-Evaluation  04/10/17    Authorization Type  Medicare part A and Medicaid [place] A    Authorization Time Period  03/13/17 - 04/19/17; 04/18/17-05/16/17 (8 more visits)    Authorization - Visit Number  7    Authorization - Number of Visits  10    PT Start Time  2774    PT Stop Time  1559    PT Time Calculation (min)  44 min    Equipment Utilized During Treatment  Gait belt    Activity Tolerance  Patient tolerated treatment well;No increased pain    Behavior During Therapy  WFL for tasks assessed/performed       Past Medical History:  Diagnosis Date  . Anxiety   . Arthritis   . Asthma   . Chronic headaches   . Chronic neck pain   . Depression 2002  . Dysconjugate gaze   . GERD (gastroesophageal reflux disease)   . Hypertension   . Neuromuscular disorder ([place])   . Osteoporosis   . Panic attacks 2002    Past Surgical History:  Procedure Laterality Date  . BACK SURGERY     x4  . CERVICAL SPINE SURGERY     plate in neck  . CHOLECYSTECTOMY    . KNEE ARTHROSCOPY WITH MEDIAL MENISECTOMY Left 03/09/2014   Procedure: LEFT KNEE ARTHROSCOPY WITH PARTIAL MEDIAL MENISECTOMY;  Surgeon: Carole Civil, MD;  Location: [facility];  Service: Orthopedics;  Laterality: Left;  . SHOULDER ARTHROSCOPY Left 11/14/2016   Procedure: ARTHROSCOPY SHOULDER WITH LIMITED DEDBRIDEMENT AND ACROMIOPLASTY AND DISTAL CLAVICAL EXCISION;  Surgeon: Carole Civil, MD;  Location: [facility];  Service: Orthopedics;  Laterality: Left;  . TOTAL KNEE ARTHROPLASTY Left 09/14/2014   Procedure:  LEFT TOTAL KNEE ARTHROPLASTY;  Surgeon: Carole Civil, MD;  Location: [facility];  Service: Orthopedics;  Laterality: Left;    There were no vitals filed for this visit.  Subjective Assessment - 04/10/17 1527    Subjective  Patient arrives to PT session with cast on right foot. She reports it is for an old fracture and that she was not told if she has any weight bearing restrictions. She is using a cane to walk due to the cast. She also reports they gave her a walking shoe in the office and that she left it at home.    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 Also milde hydrocephalus.     Patient Stated Goals  Patient states she would like to focus on balance and decrease pain in groin    Currently in Pain?  No/denies         [facility] PT Assessment - 04/10/17 1530  Assessment   Medical Diagnosis  Post-stroke SAH impaired mobility and ADLs cognitive deficits post stroke    Referring Provider  Levada Schilling MD    Onset Date/Surgical Date  01/29/17    Prior Therapy  Pt received OT and PT services at [facility]      Restrictions   Weight Bearing Restrictions  Yes    RLE Weight Bearing  Weight bearing as tolerated unclear from MD, patient wearing cast and given walking shoe      Balance Screen   Has the patient fallen in the past 6 months  No    Has the patient had a decrease in activity level because of a fear of falling?   No    Is the patient reluctant to leave their home because of a fear of falling?   No      Functional Tests   Functional tests  Single leg stance      Single Leg Stance   Comments  LLE:  5-10 seconds, unabl to test on RLE due to cast      Strength   Right Hip Flexion  4+/5 3+/5 on 03/13/17    Right Hip ABduction  5/5 5/5 on 03/13/17    Right Hip ADduction  4+/5 3+/5 on 03/13/17    Left Hip Flexion  5/5 5/5 on 03/13/17    Left Hip ABduction  5/5 5/5 on 03/13/17    Left Hip ADduction  5/5 5/5 on 03/13/17    Right Knee Flexion  5/5 5/5 on 03/13/17    Right Knee Extension  5/5 5/5 on 03/13/17    Left Knee Flexion  5/5 5/5 on 03/13/17    Left Knee Extension  5/5 5/5 on 03/13/17    Right Ankle Dorsiflexion  5/5 5/5 on 03/13/17    Right Ankle Plantar Flexion  5/5 5/5 on 03/13/17    Right Ankle Inversion  5/5 5/5 on 03/13/17    Right Ankle Eversion  5/5 5/5 on 03/13/17    Left Ankle Dorsiflexion  5/5 5/5 on 03/13/17    Left Ankle Plantar Flexion  5/5 5/5 on 03/13/17    Left Ankle Inversion  5/5 5/5 on 03/13/17    Left Ankle Eversion  5/5 5/5 on 03/13/17      Transfers   Transfers  Sit to Stand    Sit to Stand  6: Modified independent (Device/Increase time)    Comments  patient requires extra time to sequence and perfomr however no longer needs cues and can perform without UE use      Ambulation/Gait   Ambulation/Gait  Yes    Ambulation/Gait Assistance  6: Modified independent (Device/Increase time)    Assistive device  Straight cane    Gait Comments  Patietn has been ambulating without single point cane for last several visits, she used her cane today secondary to the cast on her right foot. She continues to have decreased stride length and mabulate with slow gair, however much improved from initaiton of therapy      Berg Balance Test   Sit to Stand  Able to stand without using hands and stabilize independently    Standing Unsupported  Able to stand safely 2 minutes    Sitting with Back Unsupported but Feet Supported on Floor or Stool  Able to sit safely and securely 2 minutes    Stand to Sit  Sits safely with minimal use of hands    Transfers  Able to transfer safely,  minor use of  hands    Standing Unsupported with Eyes Closed  Able to stand 10 seconds safely    Standing Ubsupported with Feet Together  Able to place feet together independently and stand 1 minute safely    From Standing, Reach Forward with Outstretched Arm  Can reach confidently >25 cm (10")    From Standing Position, Pick up Object from Floor  Able to pick up shoe safely and easily    From Standing Position, Turn to Look Behind Over each Shoulder  Looks behind from both sides and weight shifts well    Turn 360 Degrees  Able to turn 360 degrees safely in 4 seconds or less    Standing Unsupported, Alternately Place Feet on Step/Stool  Able to complete >2 steps/needs minimal assist    Standing Unsupported, One Foot in [employer] balance while stepping or standing    Standing on One Leg  Unable to try or needs assist to prevent fall    Total Score  45    Berg comment:  Patient has improved from initial score by 7 points indicating a significant improvemetn in decreased fall risk. However a score of 45/56 indicated she remains at a significant fall risk and will benefit from further PT interventions to improve balance and safety.       [facility] Adult PT Treatment/Exercise - 04/10/17 1614      Knee/Hip Exercises: Seated   Long Arc Quad  Right;2 sets;15 reps    Marching  Strengthening;Right;15 reps;2 sets      Knee/Hip Exercises: Supine   Straight Leg Raises  Right;2 sets;15 reps       PT Education - 04/10/17 1606    Education provided  Yes    Education Details  Educated on progress with therapy and improvements in balance. Educated on precautions for weight bearing with cast on right foot for reported old metatarsal fracture. Patient instructed to wear walking shoe when mobilizing and to keep weight off of her right foot as much as possible per instructions from [place], [place], from Dr. Aline Brochure office. Updated HEP for right LE strenghtening.    Person(s) Educated  Patient;Spouse     Methods  Explanation;Handout    Comprehension  Verbalized understanding       PT Short Term Goals - 04/10/17 1629      PT SHORT TERM GOAL #1   Title  Patient and caregiver will report understanding and regular compliance with HEP.     Baseline  Educated about plans for therapy    Time  2    Period  Weeks    Status  Achieved      PT SHORT TERM GOAL #2   Title  Patient will demonstrate ability to perform single limb stance independently for 2 seconds bilaterally in order to help with stair ambulation.     Baseline  Patient is unable to perform single limb stance without assistance.     Time  2    Period  Weeks    Status  Achieved      PT SHORT TERM GOAL #3   Title  Patient will demonstrate improvement on Berg balance test of 5 points in order to improve balance and reduce risk of falls.     Baseline  Berg score: 38; 04/10/17 - 45/56    Time  2    Period  Weeks    Status  Achieved        PT Long Term Goals - 04/10/17 1629  PT LONG TERM GOAL #1   Title  Patient will demonstrate improvement in berg balance test of 8 points indicating improved balance and decreased risk of falls.     Baseline  Berg score: 38; 04/10/17 - 45/56 (7 point improvement) - updated this date    Time  4    Period  Weeks    Status  On-going    Target Date  05/15/17      PT LONG TERM GOAL #2   Title  Patient will demonstrate ability to perform single limb stance for 30 seconds independently bilaterally in order to assist with navigation in the environment and with stair navigation.     Baseline  able to stand x 10 seconds B today will change to 30 seconds; 04/10/17 - LLE for 5-10 seconds will continue with goal of 30 seconds    Time  4    Period  Weeks    Status  On-going    Target Date  05/15/17      PT LONG TERM GOAL #3   Title  Patient will demonstrate improved gait mechanics with decreased hip drop and increased stride length with 50% of gait in order to improve functional mobility.      Baseline  Patient demonstrates hip drop and decreased stride length 100% of time with gait. 04/10/17 - improving howeverpateint remains to have decreased stride length and decreased gait velocity    Time  4    Period  Weeks    Status  On-going    Target Date  05/15/17      PT LONG TERM GOAL #4   Title  Patient's FOTO score will improve by 10% indicating improvement in functional mobility.     Baseline  FOTO score: 46% limited; 04/10/17 - 14% limited    Time  4    Period  Weeks    Status  Achieved      PT LONG TERM GOAL #5   Title  Patient will ambulate with 1.0 m/s gait velocity for safe community ambulation distance during 3 MWT with LRAD.    Time  5    Period  Weeks    Status  New    Target Date  05/15/17       Plan - 04/10/17 1611    Clinical Impression Statement  Re-assessment was performed today and patient has met 3/3 short term goals and 1/4 long term goals. She has improved her LE strength and independence with transfer and gait. Her Berg Balance Score has improved significantly from 38/56 to 45/56. This score indicated she remains at a significant risk of falling. She remains limited by single limb balance activities and cannot perform SLS for greater than 7 seconds bilaterally. [place] will benefit from ongoing skilled PT interventions to address remaining impairments, progress towards goals, and improve independence with functional mobility. She was limited today with new status of a cast on her right foot. Weight bearing restrictions were unclear and per Dr. Ruthe Mannan assistant patient was instructed to keep weight off of her foot when possible and to wear walking shoe if mobilizing. The cast places Samanta at decrease balance function and is further indication to address balance deficits. If she is to be non-weight bearing she will benefit from gait training to maintain status. PT will follow-up with referring.     Rehab Potential  Good    Clinical Impairments Affecting Rehab  Potential  pain; strength limitations in lower extremities; balance difficulties    PT Frequency  2x / week    PT Duration  4 weeks    PT Treatment/Interventions  ADLs/Self Care Home Management;DME Instruction;Gait training;Stair training;Functional mobility training;Therapeutic activities;Therapeutic exercise;Balance training;Neuromuscular re-education;Patient/family education;Manual techniques;Passive range of motion;Energy conservation;Cryotherapy;Electrical Stimulation;Moist Heat;Ultrasound    PT Next Visit Plan  Follow-up with Dr. Aline Brochure to determine WB status of RLE. If he provides further details call patient to inform her. Continue with [employer] and yoga poses. Continue with dual task gait challenges. Adjust gait trianing as need per RLE cast and WB status. Give patient handout for HEP next session..    PT Home Exercise Plan  03/20/17: sit to stand, tandem stance and SLS; 04/10/17 - SLR, seated marching    Consulted and Agree with Plan of Care  Patient;Family member/caregiver    Family Member Consulted  husband       Patient will benefit from skilled therapeutic intervention in order to improve the following deficits and impairments:  Abnormal gait, Decreased activity tolerance, Decreased cognition, Decreased endurance, Decreased range of motion, Decreased strength, Increased fascial restricitons, Improper body mechanics, Pain, Decreased balance, Decreased coordination, Decreased mobility, Decreased safety awareness, Difficulty walking, Postural dysfunction  Visit Diagnosis: Other abnormalities of gait and mobility - Plan: PT plan of care cert/re-cert  Balance problems - Plan: PT plan of care cert/re-cert  Decreased strength of lower extremity - Plan: PT plan of care cert/re-cert     Problem List Patient Active Problem List   Diagnosis Date Noted  . Arthrosis of left acromioclavicular joint   . Rotator cuff syndrome of left shoulder   . Primary osteoarthritis of left shoulder   .  Surgical aftercare, musculoskeletal system 10/11/2014  . Status post total left knee replacement 10/11/2014  . Arthritis of left knee 09/14/2014  . Knee contusion 12/27/2013  . Medial meniscus, posterior horn derangement 12/27/2013  . Arthritis of knee, left 12/27/2013  . Left knee pain 12/27/2013  . 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 KNEE PAIN 05/11/2007  . [place] DEGENERATION 05/11/2007  . SCIATICA 05/11/2007    Kipp Brood, PT, DPT Physical Therapist with [facility]  04/10/2017 6:37 PM    [place], [place], [address] Phone: [phone]   Fax:  [phone]  Name: Alyssa Poole MRN: [id number] Date of Birth: 1961/02/10

## 2017-04-11 NOTE — Telephone Encounter (Signed)
Needs non weight bearing for 4 weeks

## 2017-04-14 ENCOUNTER — Telehealth (HOSPITAL_COMMUNITY): Payer: Self-pay

## 2017-04-14 NOTE — Telephone Encounter (Signed)
I called [name] to inform her Dr. Romeo AppleHarrison returned my message and confirmed he would like her to maintain NWB of the R LE for the next 4 weeks. I educated her to begin using her rolling walker to ambulate and to keep weight off her right foot. She agreed to this. I reminded her of her next appointment tomorrow at 2:30 PM for PT following her 1:45 PM appointment with OT.  Valentino Saxonachel Quinn-Brown, PT, DPT Physical Therapist with [facility]  04/14/2017 4:14 PM

## 2017-04-15 ENCOUNTER — Encounter (HOSPITAL_COMMUNITY): Payer: Self-pay

## 2017-04-15 ENCOUNTER — Ambulatory Visit (HOSPITAL_COMMUNITY): Payer: Medicare Other | Admitting: Occupational Therapy

## 2017-04-15 ENCOUNTER — Ambulatory Visit (HOSPITAL_COMMUNITY): Payer: Medicare Other

## 2017-04-15 ENCOUNTER — Encounter (HOSPITAL_COMMUNITY): Payer: Medicare Other | Admitting: Speech Pathology

## 2017-04-15 ENCOUNTER — Encounter (HOSPITAL_COMMUNITY): Payer: Self-pay | Admitting: Occupational Therapy

## 2017-04-15 DIAGNOSIS — R2689 Other abnormalities of gait and mobility: Secondary | ICD-10-CM

## 2017-04-15 DIAGNOSIS — M25512 Pain in left shoulder: Secondary | ICD-10-CM

## 2017-04-15 DIAGNOSIS — R29898 Other symptoms and signs involving the musculoskeletal system: Secondary | ICD-10-CM

## 2017-04-15 DIAGNOSIS — R41841 Cognitive communication deficit: Secondary | ICD-10-CM | POA: Diagnosis not present

## 2017-04-15 NOTE — Therapy (Signed)
[place] [facility] [address] [place], [place], [address] Phone: [phone]   Fax:  [phone]  Occupational Therapy Treatment  Patient Details  Name: Alyssa Poole MRN: [id number] Date of Birth: [birth year] Referring Provider: Renato Gails MD   Encounter Date: 04/15/2017  OT End of Session - 04/15/17 1533    Visit Number  7    Number of Visits  14    Date for OT Re-Evaluation  05/10/17    Authorization Type  1) Medicare 2) Medicaid    OT Start Time  1349    OT Stop Time  1430    OT Time Calculation (min)  41 min    Activity Tolerance  Patient tolerated treatment well    Behavior During Therapy  [facility] for tasks assessed/performed       Past Medical History:  Diagnosis Date  . Anxiety   . Arthritis   . Asthma   . Chronic headaches   . Chronic neck pain   . Depression 2002  . Dysconjugate gaze   . GERD (gastroesophageal reflux disease)   . Hypertension   . Neuromuscular disorder ([facility])   . Osteoporosis   . Panic attacks 2002    Past Surgical History:  Procedure Laterality Date  . BACK SURGERY     x4  . CERVICAL SPINE SURGERY     plate in neck  . CHOLECYSTECTOMY    . KNEE ARTHROSCOPY WITH MEDIAL MENISECTOMY Left 03/09/2014   Procedure: LEFT KNEE ARTHROSCOPY WITH PARTIAL MEDIAL MENISECTOMY;  Surgeon: Vickki Hearing, MD;  Location: [facility];  Service: Orthopedics;  Laterality: Left;  . SHOULDER ARTHROSCOPY Left 11/14/2016   Procedure: ARTHROSCOPY SHOULDER WITH LIMITED DEDBRIDEMENT AND ACROMIOPLASTY AND DISTAL CLAVICAL EXCISION;  Surgeon: Vickki Hearing, MD;  Location: [facility];  Service: Orthopedics;  Laterality: Left;  . TOTAL KNEE ARTHROPLASTY Left 09/14/2014   Procedure: LEFT TOTAL KNEE ARTHROPLASTY;  Surgeon: Vickki Hearing, MD;  Location: [facility];  Service: Orthopedics;  Laterality: Left;    There were no vitals filed for this visit.  Subjective Assessment - 04/15/17 1352    Subjective   S: I called the doctor  but they said they only work on one thing at a time.     Currently in Pain?  Yes    Pain Score  6     Pain Location  Shoulder    Pain Orientation  Left    Pain Descriptors / Indicators  Aching;Sore    Pain Type  Acute pain    Pain Radiating Towards  n/a    Pain Onset  More than a month ago    Pain Frequency  Constant    Aggravating Factors   use of left arm    Pain Relieving Factors  rest    Effect of Pain on Daily Activities  mod effect on ADL completion    Multiple Pain Sites  No         [facility] OT Assessment - 04/15/17 1352      Assessment   Medical Diagnosis  Post-stroke SAH impaired mobility and ADLs cognitive deficits post stroke      Precautions   Precautions  Other (comment)    Precaution Comments  Low vision- left eye blind      Restrictions   Weight Bearing Restrictions  Yes    RLE Weight Bearing  Weight bearing as tolerated               OT Treatments/Exercises (  OP) - 04/15/17 1404      Exercises   Exercises  Shoulder;Hand      Shoulder Exercises: Supine   Protraction  PROM;5 reps    Horizontal ABduction  PROM;5 reps    External Rotation  PROM;5 reps    Internal Rotation  PROM;5 reps    Flexion  PROM;5 reps    ABduction  PROM;5 reps      Shoulder Exercises: Seated   Protraction  Strengthening;10 reps    Protraction Weight (lbs)  1    Horizontal ABduction  AROM;10 reps    External Rotation  Strengthening;10 reps    External Rotation Weight (lbs)  1    Internal Rotation  Strengthening;10 reps    Internal Rotation Weight (lbs)  1    Flexion  AROM;10 reps;Right    Flexion Limitations  unable to complete with LUE due to pain    Abduction  AROM;10 reps      Hand Exercises   Hand Gripper with Large Beads  2 beads with gripper set at 35# (left hand)    Hand Gripper with Medium Beads  2 beads gripper at 28# (horizontal)      Functional Reaching Activities   Mid Level  Pt used right hand to place 27 clothespins along vertical pole using 3 point  pinch, all clothespin colors. Pt then removed using left hand and 3 point pinch. Task focusing on shoulder ROM/strength and pinch strength.      Manual Therapy   Manual Therapy  Myofascial release    Manual therapy comments  Manual therapy completed prior to exercises.     Myofascial Release  Myofascial release and manual stretching completed to left upper arm, trapezius, and scapularis region to decrease fascial restrictions and increase joint mobility in a pain free zone.            OT Short Term Goals - 04/10/17 1355      OT SHORT TERM GOAL #1   Title  Pt will be educated and independent with HEP to increase functional performance and endurance during ADL tasks.    Time  4    Period  Weeks    Status  On-going      OT SHORT TERM GOAL #2   Title  Patient will increase her grip and pinch strength to be within average norms in order to complete gripping tasks such as opening containers and jars with less difficulty.     Time  4    Period  Weeks    Status  On-going      OT SHORT TERM GOAL #3   Title  Patient will increase her BUE strength to 5/5 to increase functional performance during daily tasks.     Time  4    Period  Weeks    Status  On-going      OT SHORT TERM GOAL #4   Title  Patient will decrease fascial restrictions to min amount to increase functional mobility and comfort level when completing functional daily tasks.     Time  4    Period  Weeks    Status  New               Plan - 04/15/17 1533    Clinical Impression Statement  A: Pt limited in exercise completion using LUE due to pain along anterior shoulder/deltoid and bicep regions. Pt reports she called MD however MD will only address one thing at a time, which is currently her foot  fracture. Pt did well with functional reaching/pinching task, max difficulty with hand gripper using left hand. Verbal cuing for form and technique.     Plan  P: Functional reaching with BUE, grip strengthening task        Patient will benefit from skilled therapeutic intervention in order to improve the following deficits and impairments:  Pain, Impaired UE functional use, Increased fascial restrictions, Decreased strength  Visit Diagnosis: Other symptoms and signs involving the musculoskeletal system  Acute pain of left shoulder    Problem List Patient Active Problem List   Diagnosis Date Noted  . Arthrosis of left acromioclavicular joint   . Rotator cuff syndrome of left shoulder   . Primary osteoarthritis of left shoulder   . Surgical aftercare, musculoskeletal system 10/11/2014  . Status post total left knee replacement 10/11/2014  . Arthritis of left knee 09/14/2014  . Knee contusion 12/27/2013  . Medial meniscus, posterior horn derangement 12/27/2013  . Arthritis of knee, left 12/27/2013  . Left knee pain 12/27/2013  . 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 KNEE PAIN 05/11/2007  . DISC DEGENERATION 05/11/2007  . SCIATICA 05/11/2007   Ezra SitesLeslie Irving Lubbers, OTR/L  [phone] 04/15/2017, 3:36 PM  [place] [facility] [address] [place], [name], [address] Phone: [phone]   Fax:  [phone]  Name: Silvano RuskBobbie W Benton MRN: [id number] Date of Birth: [birth year]

## 2017-04-15 NOTE — Therapy (Signed)
[facility] [address] [place], [place], [address] Phone: [phone]   Fax:  [phone]  Physical Therapy Treatment  Patient Details  Name: Alyssa Poole MRN: [id number] Date of Birth: July 28, 1960 Referring Provider: Renato Gails MD   Encounter Date: 04/15/2017  PT End of Session - 04/15/17 1515    Visit Number  8    Number of Visits  17    Date for PT Re-Evaluation  05/08/17    Authorization Type  Medicare part A and Medicaid [place] A    Authorization Time Period  03/13/17 - 04/19/17; 04/18/17-05/16/17 (8 more visits)    Authorization - Visit Number  8    Authorization - Number of Visits  10    PT Start Time  1434    PT Stop Time  1512    PT Time Calculation (min)  38 min    Equipment Utilized During Treatment  Gait belt    Activity Tolerance  Patient tolerated treatment well;No increased pain    Behavior During Therapy  WFL for tasks assessed/performed       Past Medical History:  Diagnosis Date  . Anxiety   . Arthritis   . Asthma   . Chronic headaches   . Chronic neck pain   . Depression 2002  . Dysconjugate gaze   . GERD (gastroesophageal reflux disease)   . Hypertension   . Neuromuscular disorder (HCC)   . Osteoporosis   . Panic attacks 2002    Past Surgical History:  Procedure Laterality Date  . BACK SURGERY     x4  . CERVICAL SPINE SURGERY     plate in neck  . CHOLECYSTECTOMY    . KNEE ARTHROSCOPY WITH MEDIAL MENISECTOMY Left 03/09/2014   Procedure: LEFT KNEE ARTHROSCOPY WITH PARTIAL MEDIAL MENISECTOMY;  Surgeon: Vickki Hearing, MD;  Location: [facility];  Service: Orthopedics;  Laterality: Left;  . SHOULDER ARTHROSCOPY Left 11/14/2016   Procedure: ARTHROSCOPY SHOULDER WITH LIMITED DEDBRIDEMENT AND ACROMIOPLASTY AND DISTAL CLAVICAL EXCISION;  Surgeon: Vickki Hearing, MD;  Location: [facility];  Service: Orthopedics;  Laterality: Left;  . TOTAL KNEE ARTHROPLASTY Left 09/14/2014   Procedure: LEFT TOTAL KNEE  ARTHROPLASTY;  Surgeon: Vickki Hearing, MD;  Location: [facility];  Service: Orthopedics;  Laterality: Left;    There were no vitals filed for this visit.  Subjective Assessment - 04/15/17 1432    Subjective  Pt stated she feels okay today, arrived with new boot on Rt foot with cast on.  No reports of pain today.    Pertinent History  hypertension, s/p SAH, osteoporosis, patient is blind in left eye    Patient Stated Goals  Patient states she would like to focus on balance and decrease pain in groin    Currently in Pain?  No/denies                      [facility] Adult PT Treatment/Exercise - 04/15/17 1506      Ambulation/Gait   Ambulation/Gait  Yes    Ambulation/Gait Assistance  4: Min guard    Ambulation Distance (Feet)  85 Feet    Assistive device  Rolling walker Rt NWB    Gait Comments  Educated on non-weight bearing gait mechanics,      Knee/Hip Exercises: Supine   Straight Leg Raises  2 sets;15 reps;Left NWB Rt KLE              STS elevated height Rt LE  NWB    Balance Exercises - 04/15/17 1507      Balance Exercises: Standing   [facility] Rotation  R/L;Foam/compliant surface sitting on Dynadisc with no UE/LE support     Marching Limitations  2 sets x 10 with opposite UE/LE hold 5" sitting on dynadisc        PT Education - 04/15/17 1455    Education provided  Yes    Education Details  Education non-weight bearing Rt LE and appropraite AD to use.  Encouraged to use wheelchair (going to [place] MD tomorrow) when on long duration as pt has tendency to rest right foot on floor when resting    Person(s) Educated  Patient;Spouse    Methods  Explanation;Handout    Comprehension  Verbalized understanding;Need further instruction       PT Short Term Goals - 04/10/17 1629      PT SHORT TERM GOAL #1   Title  Patient and caregiver will report understanding and regular compliance with HEP.     Baseline  Educated about plans for therapy    Time  2    Period  Weeks     Status  Achieved      PT SHORT TERM GOAL #2   Title  Patient will demonstrate ability to perform single limb stance independently for 2 seconds bilaterally in order to help with stair ambulation.     Baseline  Patient is unable to perform single limb stance without assistance.     Time  2    Period  Weeks    Status  Achieved      PT SHORT TERM GOAL #3   Title  Patient will demonstrate improvement on Berg balance test of 5 points in order to improve balance and reduce risk of falls.     Baseline  Berg score: 38; 04/10/17 - 45/56    Time  2    Period  Weeks    Status  Achieved        PT Long Term Goals - 04/10/17 1629      PT LONG TERM GOAL #1   Title  Patient will demonstrate improvement in berg balance test of 8 points indicating improved balance and decreased risk of falls.     Baseline  Berg score: 38; 04/10/17 - 45/56 (7 point improvement) - updated this date    Time  4    Period  Weeks    Status  On-going    Target Date  05/15/17      PT LONG TERM GOAL #2   Title  Patient will demonstrate ability to perform single limb stance for 30 seconds independently bilaterally in order to assist with navigation in the environment and with stair navigation.     Baseline  able to stand x 10 seconds B today will change to 30 seconds; 04/10/17 - LLE for 5-10 seconds will continue with goal of 30 seconds    Time  4    Period  Weeks    Status  On-going    Target Date  05/15/17      PT LONG TERM GOAL #3   Title  Patient will demonstrate improved gait mechanics with decreased hip drop and increased stride length with 50% of gait in order to improve functional mobility.     Baseline  Patient demonstrates hip drop and decreased stride length 100% of time with gait. 04/10/17 - improving howeverpateint remains to have decreased stride length and decreased gait velocity    Time  4    Period  Weeks    Status  On-going    Target Date  05/15/17      PT LONG TERM GOAL #4   Title  Patient's FOTO  score will improve by 10% indicating improvement in functional mobility.     Baseline  FOTO score: 46% limited; 04/10/17 - 14% limited    Time  4    Period  Weeks    Status  Achieved      PT LONG TERM GOAL #5   Title  Patient will ambulate with 1.0 m/s gait velocity for safe community ambulation distance during 3 MWT with LRAD.    Time  5    Period  Weeks    Status  New    Target Date  05/15/17            Plan - 04/15/17 1516    Clinical Impression Statement  Pt educated on importance of following weight bearing precautions with new cast on Rt foot.  Educated appropraite AD to use with NWB Rt foot and gait training complete NWB with RW.  Pt with tendency to rest right foot onto floor with rest breaks.  Pt and husband educated on benefits of useing wheelchair if continues to be non-compliant with WB precautions during gait and encouraged to use on long duration.  Session focus on [place] for balance training and proximal strengthening.  Used dynadisc with seated balance activities for core strengthening and progressed to single leg bridges for proximal strengthening.  No reports of pain through session, was limited by fatigue with activites.      Rehab Potential  Good    Clinical Impairments Affecting Rehab Potential  pain; strength limitations in lower extremities; balance difficulties    PT Frequency  2x / week    PT Duration  4 weeks    PT Treatment/Interventions  ADLs/Self Care Home Management;DME Instruction;Gait training;Stair training;Functional mobility training;Therapeutic activities;Therapeutic exercise;Balance training;Neuromuscular re-education;Patient/family education;Manual techniques;Passive range of motion;Energy conservation;Cryotherapy;Electrical Stimulation;Moist Heat;Ultrasound    PT Next Visit Plan  Continue NWB Rt LE til 05/07/17 or until MD says otherwise.  Continue with [employer] and yoga poses. Continue with dual task gait challenges. Adjust gait trianing as need per RLE cast  and WB status.     PT Home Exercise Plan  03/20/17: sit to stand, tandem stance and SLS; 04/10/17 - SLR, seated marching; 01/15:  single leg bridge and marching       Patient will benefit from skilled therapeutic intervention in order to improve the following deficits and impairments:  Abnormal gait, Decreased activity tolerance, Decreased cognition, Decreased endurance, Decreased range of motion, Decreased strength, Increased fascial restricitons, Improper body mechanics, Pain, Decreased balance, Decreased coordination, Decreased mobility, Decreased safety awareness, Difficulty walking, Postural dysfunction  Visit Diagnosis: Other abnormalities of gait and mobility  Balance problems  Decreased strength of lower extremity     Problem List Patient Active Problem List   Diagnosis Date Noted  . Arthrosis of left acromioclavicular joint   . Rotator cuff syndrome of left shoulder   . Primary osteoarthritis of left shoulder   . Surgical aftercare, musculoskeletal system 10/11/2014  . Status post total left knee replacement 10/11/2014  . Arthritis of left knee 09/14/2014  . Knee contusion 12/27/2013  . Medial meniscus, posterior horn derangement 12/27/2013  . Arthritis of knee, left 12/27/2013  . Left knee pain 12/27/2013  . 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 KNEE PAIN 05/11/2007  . DISC DEGENERATION 05/11/2007  . SCIATICA 05/11/2007  [address] LPTA; CBIS [phone]  Juel Burrow 04/15/2017, 3:28 PM  [place] [facility] [address] [place], [place], [address] Phone: [phone]   Fax:  [phone]  Name: Alyssa Poole MRN: [id number] Date of Birth: 1960-10-09

## 2017-04-17 ENCOUNTER — Encounter (HOSPITAL_COMMUNITY): Payer: Medicare Other | Admitting: Speech Pathology

## 2017-04-17 ENCOUNTER — Ambulatory Visit (HOSPITAL_COMMUNITY): Payer: Medicare Other

## 2017-04-17 ENCOUNTER — Telehealth (HOSPITAL_COMMUNITY): Payer: Self-pay | Admitting: Family Medicine

## 2017-04-17 NOTE — Telephone Encounter (Signed)
04/17/17  pt left a message that her boyfriend was sick and he is the one that brings her to her appts.

## 2017-04-22 ENCOUNTER — Ambulatory Visit (HOSPITAL_COMMUNITY): Payer: Medicare Other | Admitting: Occupational Therapy

## 2017-04-22 ENCOUNTER — Encounter (HOSPITAL_COMMUNITY): Payer: Self-pay | Admitting: Occupational Therapy

## 2017-04-22 ENCOUNTER — Ambulatory Visit (HOSPITAL_COMMUNITY): Payer: Medicare Other | Admitting: Physical Therapy

## 2017-04-22 ENCOUNTER — Encounter (HOSPITAL_COMMUNITY): Payer: Medicare Other | Admitting: Speech Pathology

## 2017-04-22 ENCOUNTER — Encounter (HOSPITAL_COMMUNITY): Payer: Self-pay | Admitting: Physical Therapy

## 2017-04-22 DIAGNOSIS — R41841 Cognitive communication deficit: Secondary | ICD-10-CM | POA: Diagnosis not present

## 2017-04-22 DIAGNOSIS — R2689 Other abnormalities of gait and mobility: Secondary | ICD-10-CM

## 2017-04-22 DIAGNOSIS — R29898 Other symptoms and signs involving the musculoskeletal system: Secondary | ICD-10-CM

## 2017-04-22 DIAGNOSIS — M25512 Pain in left shoulder: Secondary | ICD-10-CM

## 2017-04-22 NOTE — Therapy (Signed)
[place], [place], [address] Phone: [phone]   Fax:  [phone]  Physical Therapy Treatment  Patient Details  Name: Alyssa Poole MRN: [id number] Date of Birth: [birth year] Referring Provider: Levada Schilling MD   Encounter Date: 04/22/2017  PT End of Session - 04/22/17 1457    Visit Number  9    Number of Visits  9    Date for PT Re-Evaluation  05/08/17    Authorization Type  Medicare part A and Medicaid [place] A    Authorization Time Period  03/13/17 - 04/19/17; 04/18/17-05/16/17 (8 more visits)    Authorization - Visit Number  9    Authorization - Number of Visits  9    PT Start Time  9233    PT Stop Time  1452    PT Time Calculation (min)  17 min    Equipment Utilized During Treatment  Gait belt    Activity Tolerance  Patient tolerated treatment well;No increased pain    Behavior During Therapy  WFL for tasks assessed/performed       Past Medical History:  Diagnosis Date  . Anxiety   . Arthritis   . Asthma   . Chronic headaches   . Chronic neck pain   . Depression 2002  . Dysconjugate gaze   . GERD (gastroesophageal reflux disease)   . Hypertension   . Neuromuscular disorder ([place])   . Osteoporosis   . Panic attacks 2002    Past Surgical History:  Procedure Laterality Date  . BACK SURGERY     x4  . CERVICAL SPINE SURGERY     plate in neck  . CHOLECYSTECTOMY    . KNEE ARTHROSCOPY WITH MEDIAL MENISECTOMY Left 03/09/2014   Procedure: LEFT KNEE ARTHROSCOPY WITH PARTIAL MEDIAL MENISECTOMY;  Surgeon: Carole Civil, MD;  Location: [facility];  Service: Orthopedics;  Laterality: Left;  . SHOULDER ARTHROSCOPY Left 11/14/2016   Procedure: ARTHROSCOPY SHOULDER WITH LIMITED DEDBRIDEMENT AND ACROMIOPLASTY AND DISTAL CLAVICAL EXCISION;  Surgeon: Carole Civil, MD;  Location: [facility];  Service: Orthopedics;  Laterality: Left;  . TOTAL KNEE ARTHROPLASTY Left 09/14/2014   Procedure: LEFT TOTAL KNEE  ARTHROPLASTY;  Surgeon: Carole Civil, MD;  Location: [facility];  Service: Orthopedics;  Laterality: Left;    There were no vitals filed for this visit.  Subjective Assessment - 04/22/17 1439    Subjective  PT states she forgot her walker.  She is suppose to be non weight bearing on the Rt foot.   I have increased pain putting my coat on and I can not do my hair.    Pertinent History  hypertension, s/p SAH, osteoporosis, patient is blind in left eye    Patient Stated Goals  Patient states she would like to focus on balance and decrease pain in groin    Currently in Pain?  Yes    Pain Score  5     Pain Location  Shoulder    Pain Orientation  Left    Pain Type  Acute pain    Aggravating Factors   motin          [facility] PT Assessment - 04/22/17 1442      Assessment   Medical Diagnosis  Post-stroke SAH impaired mobility and ADLs cognitive deficits post stroke    Onset Date/Surgical Date  01/29/17    Prior Therapy  Pt received OT and PT services at [facility]  Weight Bearing Restrictions  Yes    RLE Weight Bearing  Weight bearing as tolerated unclear from MD, patient wearing cast and given walking shoe      Observation/Other Assessments   Focus on Therapeutic Outcomes (FOTO)   60      Functional Tests   Functional tests  Single leg stance      Single Leg Stance   Comments  LLE: 5-10 seconds, unabl to test on RLE due to cast      Strength   Right Hip Flexion  4+/5 3+/5 on 03/13/17    Right Hip ABduction  5/5 5/5 on 03/13/17    Right Hip ADduction  4+/5 3+/5 on 03/13/17    Left Hip Flexion  5/5 5/5 on 03/13/17    Left Hip ABduction  5/5 5/5 on 03/13/17    Left Hip ADduction  5/5 5/5 on 03/13/17    Right Knee Flexion  5/5 5/5 on 03/13/17    Right Knee Extension  5/5 5/5 on 03/13/17    Left Knee Flexion  5/5 5/5 on 03/13/17    Left Knee Extension  5/5 5/5 on 03/13/17    Right Ankle Dorsiflexion  5/5 5/5 on 03/13/17    Right Ankle Plantar Flexion  5/5 5/5 on  03/13/17    Right Ankle Inversion  5/5 5/5 on 03/13/17    Right Ankle Eversion  5/5 5/5 on 03/13/17    Left Ankle Dorsiflexion  5/5 5/5 on 03/13/17    Left Ankle Plantar Flexion  5/5 5/5 on 03/13/17    Left Ankle Inversion  5/5 5/5 on 03/13/17    Left Ankle Eversion  5/5 5/5 on 03/13/17      Transfers   Transfers  Sit to Stand    Sit to Stand  6: Modified independent (Device/Increase time)    Comments  patient requires extra time to sequence and perfomr however no longer needs cues and can perform without UE use      Ambulation/Gait   Ambulation/Gait  Yes    Ambulation/Gait Assistance  6: Modified independent (Device/Increase time)    Assistive device  Straight cane    Gait Comments  Patietn has been ambulating without single point cane for last several visits, she used her cane today secondary to the cast on her right foot. She continues to have decreased stride length and mabulate with slow gair, however much improved from initaiton of therapy      Berg Balance Test   Sit to Stand  Able to stand without using hands and stabilize independently    Standing Unsupported  Able to stand safely 2 minutes    Sitting with Back Unsupported but Feet Supported on Floor or Stool  Able to sit safely and securely 2 minutes    Stand to Sit  Sits safely with minimal use of hands    Transfers  Able to transfer safely, minor use of hands    Standing Unsupported with Eyes Closed  Able to stand 10 seconds safely    Standing Ubsupported with Feet Together  Able to place feet together independently and stand 1 minute safely    From Standing, Reach Forward with Outstretched Arm  Can reach confidently >25 cm (10")    From Standing Position, Pick up Object from Floor  Able to pick up shoe safely and easily    From Standing Position, Turn to Look Behind Over each Shoulder  Looks behind from both sides and weight shifts well    Turn  360 Degrees  Able to turn 360 degrees safely in 4 seconds or less    Standing  Unsupported, Alternately Place Feet on Step/Stool  Able to complete >2 steps/needs minimal assist    Standing Unsupported, One Foot in [employer] balance while stepping or standing    Standing on One Leg  Unable to try or needs assist to prevent fall    Total Score  45    Berg comment:  Patient has improved from initial score by 7 points indicating a significant improvemetn in decreased fall risk. However a score of 45/56 indicated she remains at a significant fall risk and will benefit from further PT interventions to improve balance and safety.                          PT Education - 04/22/17 1453    Education provided  Yes    Education Details  non weight bearing HEP    Person(s) Educated  Patient    Methods  Explanation    Comprehension  Verbalized understanding       PT Short Term Goals - 04/10/17 1629      PT SHORT TERM GOAL #1   Title  Patient and caregiver will report understanding and regular compliance with HEP.     Baseline  Educated about plans for therapy    Time  2    Period  Weeks    Status  Achieved      PT SHORT TERM GOAL #2   Title  Patient will demonstrate ability to perform single limb stance independently for 2 seconds bilaterally in order to help with stair ambulation.     Baseline  Patient is unable to perform single limb stance without assistance.     Time  2    Period  Weeks    Status  Achieved      PT SHORT TERM GOAL #3   Title  Patient will demonstrate improvement on Berg balance test of 5 points in order to improve balance and reduce risk of falls.     Baseline  Berg score: 38; 04/10/17 - 45/56    Time  2    Period  Weeks    Status  Achieved        PT Long Term Goals - 04/10/17 1629      PT LONG TERM GOAL #1   Title  Patient will demonstrate improvement in berg balance test of 8 points indicating improved balance and decreased risk of falls.     Baseline  Berg score: 38; 04/10/17 - 45/56 (7 point improvement) - updated this  date    Time  4    Period  Weeks    Status  On-going    Target Date  05/15/17      PT LONG TERM GOAL #2   Title  Patient will demonstrate ability to perform single limb stance for 30 seconds independently bilaterally in order to assist with navigation in the environment and with stair navigation.     Baseline  able to stand x 10 seconds B today will change to 30 seconds; 04/10/17 - LLE for 5-10 seconds will continue with goal of 30 seconds    Time  4    Period  Weeks    Status  On-going    Target Date  05/15/17      PT LONG TERM GOAL #3   Title  Patient will demonstrate improved gait mechanics  with decreased hip drop and increased stride length with 50% of gait in order to improve functional mobility.     Baseline  Patient demonstrates hip drop and decreased stride length 100% of time with gait. 04/10/17 - improving howeverpateint remains to have decreased stride length and decreased gait velocity    Time  4    Period  Weeks    Status  On-going    Target Date  05/15/17      PT LONG TERM GOAL #4   Title  Patient's FOTO score will improve by 10% indicating improvement in functional mobility.     Baseline  FOTO score: 46% limited; 04/10/17 - 14% limited    Time  4    Period  Weeks    Status  Achieved      PT LONG TERM GOAL #5   Title  Patient will ambulate with 1.0 m/s gait velocity for safe community ambulation distance during 3 MWT with LRAD.    Time  5    Period  Weeks    Status  New    Target Date  05/15/17            Plan - 04/22/17 1453    Clinical Impression Statement  Pt comes into department without an assistive device.  Therapist reminded pt of her current nonweight bearing status.  Pt states she just forgot her walker.   Ateempted to get pt to walk with rolling walker non-weight bearing but pt continues to place weight on her LE.  Pt main deficit at this time is balance and we are not able to work on her balance due to recent foot fx therefore we will discharge pt to  a HEP    Rehab Potential  Good    Clinical Impairments Affecting Rehab Potential  pain; strength limitations in lower extremities; balance difficulties    PT Frequency  2x / week    PT Duration  4 weeks    PT Treatment/Interventions  ADLs/Self Care Home Management;DME Instruction;Gait training;Stair training;Functional mobility training;Therapeutic activities;Therapeutic exercise;Balance training;Neuromuscular re-education;Patient/family education;Manual techniques;Passive range of motion;Energy conservation;Cryotherapy;Electrical Stimulation;Moist Heat;Ultrasound    PT Next Visit Plan  Discharge from PT services.  If pt notes deficits after weight bearing restrictions are over she can request another referral.     PT Home Exercise Plan  03/20/17: sit to stand, tandem stance and SLS; 04/10/17 - SLR, seated marching; 01/15:  single leg bridge and marching       Patient will benefit from skilled therapeutic intervention in order to improve the following deficits and impairments:  Abnormal gait, Decreased activity tolerance, Decreased cognition, Decreased endurance, Decreased range of motion, Decreased strength, Increased fascial restricitons, Improper body mechanics, Pain, Decreased balance, Decreased coordination, Decreased mobility, Decreased safety awareness, Difficulty walking, Postural dysfunction  Visit Diagnosis: Other symptoms and signs involving the musculoskeletal system  Balance problems  Decreased strength of lower extremity  Other abnormalities of gait and mobility     Problem List Patient Active Problem List   Diagnosis Date Noted  . Arthrosis of left acromioclavicular joint   . Rotator cuff syndrome of left shoulder   . Primary osteoarthritis of left shoulder   . Surgical aftercare, musculoskeletal system 10/11/2014  . Status post total left knee replacement 10/11/2014  . Arthritis of left knee 09/14/2014  . Knee contusion 12/27/2013  . Medial meniscus, posterior horn  derangement 12/27/2013  . Arthritis of knee, left 12/27/2013  . Left knee pain 12/27/2013  . 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  KNEE PAIN 05/11/2007  . [place] DEGENERATION 05/11/2007  . SCIATICA 05/11/2007  Rayetta Humphrey, PT CLT [phone] 04/22/2017, 2:58 PM  [place]    PHYSICAL THERAPY DISCHARGE SUMMARY  Visits from Start of Care: 9  Current functional level related to goals / functional outcomes: See above   Remaining deficits: See above   Education / Equipment: HEP Plan: Patient agrees to discharge.  Patient goals were partially met. Patient is being discharged due to financial reasons.  ?????     [address] [place], [place], [address] Phone: [phone]   Fax:  [phone]  Name: Alyssa Poole MRN: [id number] Date of Birth: 02-11-1961   Rayetta Humphrey, [place] CLT [phone]

## 2017-04-22 NOTE — Patient Instructions (Addendum)
Strengthening: Straight Leg Raise (Phase 1)    Tighten muscles on front of right thigh, then lift leg _15___ inches from surface, keeping knee locked.  Repeat ____ times per set. Do _1___ sets per session. Do __2__ sessions per day.10  http://orth.exer.us/614   Copyright  VHI. All rights reserved.  Strengthening: Hip Abduction (Side-Lying)    Tighten muscles on front of left thigh, then lift leg _15___ inches from surface, keeping knee locked.  Repeat 10____ times per set. Do __1__ sets per session. Do 2____ sessions per day.  http://orth.exer.us/622   Copyright  VHI. All rights reserved.  Strengthening: Hip Extension (Prone)    Tighten muscles on front of left thigh, then lift leg _2___ inches from surface, keeping knee locked. Repeat _10___ times per set. Do _1___ sets per session. Do ___2_ sessions per day.  http://orth.exer.us/620   Copyright  VHI. All rights reserved.

## 2017-04-22 NOTE — Therapy (Signed)
[facility] [address] [place], [name], [address] Phone: [phone]   Fax:  [phone]  Occupational Therapy Treatment  Patient Details  Name: Alyssa RuskBobbie W Benton MRN: [id number] Date of Birth: 02-Dec-1960 Referring Provider: Renato GailsAguilar, John MD   Encounter Date: 04/22/2017  OT End of Session - 04/22/17 1650    Visit Number  8    Number of Visits  14    Date for OT Re-Evaluation  05/10/17    Authorization Type  1) Medicare 2) Medicaid    OT Start Time  1515    OT Stop Time  1558    OT Time Calculation (min)  43 min    Activity Tolerance  Patient tolerated treatment well    Behavior During Therapy  [facility] for tasks assessed/performed       Past Medical History:  Diagnosis Date  . Anxiety   . Arthritis   . Asthma   . Chronic headaches   . Chronic neck pain   . Depression 2002  . Dysconjugate gaze   . GERD (gastroesophageal reflux disease)   . Hypertension   . Neuromuscular disorder (HCC)   . Osteoporosis   . Panic attacks 2002    Past Surgical History:  Procedure Laterality Date  . BACK SURGERY     x4  . CERVICAL SPINE SURGERY     plate in neck  . CHOLECYSTECTOMY    . KNEE ARTHROSCOPY WITH MEDIAL MENISECTOMY Left 03/09/2014   Procedure: LEFT KNEE ARTHROSCOPY WITH PARTIAL MEDIAL MENISECTOMY;  Surgeon: Vickki HearingStanley E Harrison, MD;  Location: [facility];  Service: Orthopedics;  Laterality: Left;  . SHOULDER ARTHROSCOPY Left 11/14/2016   Procedure: ARTHROSCOPY SHOULDER WITH LIMITED DEDBRIDEMENT AND ACROMIOPLASTY AND DISTAL CLAVICAL EXCISION;  Surgeon: Vickki HearingHarrison, Stanley E, MD;  Location: [facility];  Service: Orthopedics;  Laterality: Left;  . TOTAL KNEE ARTHROPLASTY Left 09/14/2014   Procedure: LEFT TOTAL KNEE ARTHROPLASTY;  Surgeon: Vickki HearingStanley E Harrison, MD;  Location: [facility];  Service: Orthopedics;  Laterality: Left;    There were no vitals filed for this visit.  Subjective Assessment - 04/22/17 1514    Subjective   S: My shoulder is still  messed up and I go see him on the 30th.     Currently in Pain?  Yes    Pain Score  5     Pain Location  Shoulder    Pain Orientation  Left    Pain Descriptors / Indicators  Aching;Sore    Pain Type  Acute pain    Pain Radiating Towards  n/a    Pain Onset  More than a month ago    Pain Frequency  Constant    Aggravating Factors   motion    Pain Relieving Factors  rest    Effect of Pain on Daily Activities  mod effect ADL completion    Multiple Pain Sites  No         [facility] OT Assessment - 04/22/17 1513      Assessment   Medical Diagnosis  Post-stroke SAH impaired mobility and ADLs cognitive deficits post stroke      Precautions   Precautions  Other (comment)    Precaution Comments  Low vision- left eye blind      Restrictions   Weight Bearing Restrictions  Yes    RLE Weight Bearing  Weight bearing as tolerated               OT Treatments/Exercises (OP) - 04/22/17 1517  Exercises   Exercises  Shoulder;Hand      Hand Exercises   Hand Gripper with Large Beads  left: all beads gripper at 35#; right: all beads gripper at 35#    Hand Gripper with Medium Beads  left: all beads gripper at 22#    Sponges  Pt used green clothespin to stack 5 sponges with each hand, 2 trials-trial one with 3 point pinch, trial 2 with lateral pinch      Functional Reaching Activities   Mid Level  Pt used left hand to place 25 clothespins along vertical pole of pinch tree using 3 point pinch. Pt then removed with right hand using 3 point pinch. Activity with focus on LUE protraction and flexion during functional reaching, as well as pinch strength      Fine Motor Coordination (Hand/Wrist)   Fine Motor Coordination  Stacking coins    Stacking coins  Pt held all coins in palm of left hand and worked to [employer] using left hand. No difficulty with task               OT Short Term Goals - 04/10/17 1355      OT SHORT TERM GOAL #1   Title  Pt will be educated and independent  with HEP to increase functional performance and endurance during ADL tasks.    Time  4    Period  Weeks    Status  On-going      OT SHORT TERM GOAL #2   Title  Patient will increase her grip and pinch strength to be within average norms in order to complete gripping tasks such as opening containers and jars with less difficulty.     Time  4    Period  Weeks    Status  On-going      OT SHORT TERM GOAL #3   Title  Patient will increase her BUE strength to 5/5 to increase functional performance during daily tasks.     Time  4    Period  Weeks    Status  On-going      OT SHORT TERM GOAL #4   Title  Patient will decrease fascial restrictions to min amount to increase functional mobility and comfort level when completing functional daily tasks.     Time  4    Period  Weeks    Status  New               Plan - 04/22/17 1650    Clinical Impression Statement  A: Session with focus on bilateral grip and pinch strength and LUE functional reaching. Pt able to complete strengthening tasks with increased time and occasional rest breaks, coordination task pt completed with no difficulty. Pt continues to report pain in left shoulder which is limiting use during functional tasks.     Plan  P: Resume BUE strengthening with weights or band as pt is able to tolerate with LUE pain.     Consulted and Agree with Plan of Care  Patient       Patient will benefit from skilled therapeutic intervention in order to improve the following deficits and impairments:  Pain, Impaired UE functional use, Increased fascial restrictions, Decreased strength  Visit Diagnosis: Other symptoms and signs involving the musculoskeletal system  Acute pain of left shoulder    Problem List Patient Active Problem List   Diagnosis Date Noted  . Arthrosis of left acromioclavicular joint   . Rotator cuff syndrome of left shoulder   .  Primary osteoarthritis of left shoulder   . Surgical aftercare, musculoskeletal  system 10/11/2014  . Status post total left knee replacement 10/11/2014  . Arthritis of left knee 09/14/2014  . Knee contusion 12/27/2013  . Medial meniscus, posterior horn derangement 12/27/2013  . Arthritis of knee, left 12/27/2013  . Left knee pain 12/27/2013  . 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 KNEE PAIN 05/11/2007  . DISC DEGENERATION 05/11/2007  . SCIATICA 05/11/2007   Ezra Sites, OTR/L  [phone] 04/22/2017, 4:53 PM  [facility] [address] [place], [place], [address] Phone: [phone]   Fax:  [phone]  Name: SYNA GAD MRN: [id number] Date of Birth: 1961/02/14

## 2017-04-24 ENCOUNTER — Ambulatory Visit (HOSPITAL_COMMUNITY): Payer: Medicare Other | Admitting: Physical Therapy

## 2017-04-24 ENCOUNTER — Encounter (HOSPITAL_COMMUNITY): Payer: Medicare Other | Admitting: Speech Pathology

## 2017-04-24 ENCOUNTER — Encounter (HOSPITAL_COMMUNITY): Payer: Self-pay | Admitting: Occupational Therapy

## 2017-04-24 ENCOUNTER — Ambulatory Visit (HOSPITAL_COMMUNITY): Payer: Medicare Other | Admitting: Occupational Therapy

## 2017-04-24 DIAGNOSIS — R29898 Other symptoms and signs involving the musculoskeletal system: Secondary | ICD-10-CM

## 2017-04-24 DIAGNOSIS — R41841 Cognitive communication deficit: Secondary | ICD-10-CM | POA: Diagnosis not present

## 2017-04-24 DIAGNOSIS — M25512 Pain in left shoulder: Secondary | ICD-10-CM

## 2017-04-24 NOTE — Therapy (Signed)
[facility] [address] [place], [place], [address] Phone: [phone]   Fax:  [phone]  Occupational Therapy Treatment  Patient Details  Name: Alyssa Poole MRN: [id number] Date of Birth: 20-Aug-1960 Referring Provider: Renato Gails MD   Encounter Date: 04/24/2017  OT End of Session - 04/24/17 1555    Visit Number  9    Number of Visits  14    Date for OT Re-Evaluation  05/10/17    Authorization Type  1) Medicare 2) Medicaid    OT Start Time  1518    OT Stop Time  1557    OT Time Calculation (min)  39 min    Activity Tolerance  Patient tolerated treatment well    Behavior During Therapy  [facility] for tasks assessed/performed       Past Medical History:  Diagnosis Date  . Anxiety   . Arthritis   . Asthma   . Chronic headaches   . Chronic neck pain   . Depression 2002  . Dysconjugate gaze   . GERD (gastroesophageal reflux disease)   . Hypertension   . Neuromuscular disorder (HCC)   . Osteoporosis   . Panic attacks 2002    Past Surgical History:  Procedure Laterality Date  . BACK SURGERY     x4  . CERVICAL SPINE SURGERY     plate in neck  . CHOLECYSTECTOMY    . KNEE ARTHROSCOPY WITH MEDIAL MENISECTOMY Left 03/09/2014   Procedure: LEFT KNEE ARTHROSCOPY WITH PARTIAL MEDIAL MENISECTOMY;  Surgeon: Vickki Hearing, MD;  Location: [facility];  Service: Orthopedics;  Laterality: Left;  . SHOULDER ARTHROSCOPY Left 11/14/2016   Procedure: ARTHROSCOPY SHOULDER WITH LIMITED DEDBRIDEMENT AND ACROMIOPLASTY AND DISTAL CLAVICAL EXCISION;  Surgeon: Vickki Hearing, MD;  Location: [facility];  Service: Orthopedics;  Laterality: Left;  . TOTAL KNEE ARTHROPLASTY Left 09/14/2014   Procedure: LEFT TOTAL KNEE ARTHROPLASTY;  Surgeon: Vickki Hearing, MD;  Location: [facility];  Service: Orthopedics;  Laterality: Left;    There were no vitals filed for this visit.  Subjective Assessment - 04/24/17 1517    Subjective   S: I think I'm doing so  much better.     Patient is accompained by:  Family member boyfriend    Currently in Pain?  No/denies         [facility] OT Assessment - 04/24/17 1517      Assessment   Medical Diagnosis  Post-stroke SAH impaired mobility and ADLs cognitive deficits post stroke      Precautions   Precautions  Other (comment)    Precaution Comments  Low vision- left eye blind      Restrictions   Weight Bearing Restrictions  Yes    RLE Weight Bearing  Weight bearing as tolerated               OT Treatments/Exercises (OP) - 04/24/17 1536      Exercises   Exercises  Shoulder;Hand      Shoulder Exercises: Seated   Extension  Theraband;10 reps    Theraband Level (Shoulder Extension)  Level 2 (Red)    Retraction  Theraband;10 reps    Theraband Level (Shoulder Retraction)  Level 2 (Red)    Row  Theraband;10 reps    Theraband Level (Shoulder Row)  Level 2 (Red)    Protraction  Strengthening;10 reps    Protraction Weight (lbs)  1    Horizontal ABduction  Strengthening;10 reps    Horizontal ABduction Weight (  lbs)  1    External Rotation  Strengthening;Theraband;10 reps    Theraband Level (Shoulder External Rotation)  Level 2 (Red)    External Rotation Weight (lbs)  1    Internal Rotation  Strengthening;Theraband;10 reps    Theraband Level (Shoulder Internal Rotation)  Level 2 (Red)    Internal Rotation Weight (lbs)  1    Flexion  Strengthening;10 reps    Flexion Weight (lbs)  1    Abduction  Strengthening;10 reps    ABduction Weight (lbs)  1    Other Seated Exercises  green therapy ball: chest press, overhead press, 10X each       Shoulder Exercises: ROM/Strengthening   X to V Arms  10X    Proximal Shoulder Strengthening, Seated  10X each 1 rest break      Hand Exercises   Other Hand Exercises  Pt used pvc pipe to cut circles into red theraputty working on grip strengthening. Bilateral hands      Neurological Re-education Exercises   Theraputty - Flatten  red    Theraputty - Roll   red, bilateral hands    Theraputty - Grip  red, bilateral hands, supinated and pronated    Theraputty - Pinch  bilateral hands, lateral and 3 point pinch                        OT Short Term Goals - 04/10/17 1355      OT SHORT TERM GOAL #1   Title  Pt will be educated and independent with HEP to increase functional performance and endurance during ADL tasks.    Time  4    Period  Weeks    Status  On-going      OT SHORT TERM GOAL #2   Title  Patient will increase her grip and pinch strength to be within average norms in order to complete gripping tasks such as opening containers and jars with less difficulty.     Time  4    Period  Weeks    Status  On-going      OT SHORT TERM GOAL #3   Title  Patient will increase her BUE strength to 5/5 to increase functional performance during daily tasks.     Time  4    Period  Weeks    Status  On-going      OT SHORT TERM GOAL #4   Title  Patient will decrease fascial restrictions to min amount to increase functional mobility and comfort level when completing functional daily tasks.     Time  4    Period  Weeks    Status  Ongoing              Plan - 04/24/17 1553    Clinical Impression Statement  A: Session with focus on BUE strengthening and grip strengthening. Pt able to complete BUE strengthening with minimal difficulty, occasional rest breaks for fatigue. OT notes some guarding of LUE during strengthening tasks. Verbal cuing for form and technique during all exercises.     Plan  P: Continue with BUE strengthening and grip strengthening tasks, add UBE       Patient will benefit from skilled therapeutic intervention in order to improve the following deficits and impairments:  Pain, Impaired UE functional use, Increased fascial restrictions, Decreased strength  Visit Diagnosis: Other symptoms and signs involving the musculoskeletal system  Acute pain of left shoulder    Problem List Patient Active  Problem List    Diagnosis Date Noted  . Arthrosis of left acromioclavicular joint   . Rotator cuff syndrome of left shoulder   . Primary osteoarthritis of left shoulder   . Surgical aftercare, musculoskeletal system 10/11/2014  . Status post total left knee replacement 10/11/2014  . Arthritis of left knee 09/14/2014  . Knee contusion 12/27/2013  . Medial meniscus, posterior horn derangement 12/27/2013  . Arthritis of knee, left 12/27/2013  . Left knee pain 12/27/2013  . 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 KNEE PAIN 05/11/2007  . DISC DEGENERATION 05/11/2007  . SCIATICA 05/11/2007   Ezra SitesLeslie Lunden Stieber, OTR/L  [phone] 04/24/2017, 3:58 PM  [facility] [address] [place], [name], [address] Phone: [phone]   Fax:  [phone]  Name: Alyssa Poole MRN: [id number] Date of Birth: 07-20-1960

## 2017-04-29 ENCOUNTER — Encounter (HOSPITAL_COMMUNITY): Payer: Self-pay | Admitting: Occupational Therapy

## 2017-04-29 ENCOUNTER — Ambulatory Visit (HOSPITAL_COMMUNITY): Payer: Medicare Other | Admitting: Occupational Therapy

## 2017-04-29 ENCOUNTER — Encounter (HOSPITAL_COMMUNITY): Payer: Medicare Other | Admitting: Speech Pathology

## 2017-04-29 ENCOUNTER — Ambulatory Visit (HOSPITAL_COMMUNITY): Payer: Medicare Other | Admitting: Physical Therapy

## 2017-04-29 DIAGNOSIS — R41841 Cognitive communication deficit: Secondary | ICD-10-CM | POA: Diagnosis not present

## 2017-04-29 DIAGNOSIS — Z9889 Other specified postprocedural states: Secondary | ICD-10-CM | POA: Insufficient documentation

## 2017-04-29 DIAGNOSIS — R29898 Other symptoms and signs involving the musculoskeletal system: Secondary | ICD-10-CM

## 2017-04-29 DIAGNOSIS — M25512 Pain in left shoulder: Secondary | ICD-10-CM

## 2017-04-29 NOTE — Therapy (Signed)
[facility] [address] [place], [place], [address] Phone: [phone]   Fax:  [phone]  Occupational Therapy Treatment  Patient Details  Name: Alyssa Poole MRN: [id number] Date of Birth: [birth year] Referring Provider: Renato Gails MD   Encounter Date: 04/29/2017  OT End of Session - 04/29/17 1550    Visit Number  10    Number of Visits  14    Date for OT Re-Evaluation  05/10/17    Authorization Type  1) Medicare 2) Medicaid    OT Start Time  1515    OT Stop Time  1559    OT Time Calculation (min)  44 min    Activity Tolerance  Patient tolerated treatment well    Behavior During Therapy  [facility] for tasks assessed/performed       Past Medical History:  Diagnosis Date  . Anxiety   . Arthritis   . Asthma   . Chronic headaches   . Chronic neck pain   . Depression 2002  . Dysconjugate gaze   . GERD (gastroesophageal reflux disease)   . Hypertension   . Neuromuscular disorder (HCC)   . Osteoporosis   . Panic attacks 2002    Past Surgical History:  Procedure Laterality Date  . BACK SURGERY     x4  . CERVICAL SPINE SURGERY     plate in neck  . CHOLECYSTECTOMY    . KNEE ARTHROSCOPY WITH MEDIAL MENISECTOMY Left 03/09/2014   Procedure: LEFT KNEE ARTHROSCOPY WITH PARTIAL MEDIAL MENISECTOMY;  Surgeon: Vickki Hearing, MD;  Location: [facility];  Service: Orthopedics;  Laterality: Left;  . SHOULDER ARTHROSCOPY Left 11/14/2016   Procedure: ARTHROSCOPY SHOULDER WITH LIMITED DEDBRIDEMENT AND ACROMIOPLASTY AND DISTAL CLAVICAL EXCISION;  Surgeon: Vickki Hearing, MD;  Location: [facility];  Service: Orthopedics;  Laterality: Left;  . TOTAL KNEE ARTHROPLASTY Left 09/14/2014   Procedure: LEFT TOTAL KNEE ARTHROPLASTY;  Surgeon: Vickki Hearing, MD;  Location: [facility];  Service: Orthopedics;  Laterality: Left;    There were no vitals filed for this visit.  Subjective Assessment - 04/29/17 1514    Subjective   S: I'm not hurting too  bad today.     Currently in Pain?  Yes    Pain Score  2     Pain Location  Shoulder    Pain Orientation  Left    Pain Descriptors / Indicators  Aching;Sore    Pain Type  Acute pain    Pain Radiating Towards  n/a    Pain Onset  In the past 7 days    Pain Frequency  Constant    Aggravating Factors   movement    Pain Relieving Factors  rest, heat    Effect of Pain on Daily Activities  mod effect ADL completion    Multiple Pain Sites  No         [facility] OT Assessment - 04/29/17 1514      Assessment   Medical Diagnosis  Post-stroke SAH impaired mobility and ADLs cognitive deficits post stroke      Precautions   Precautions  Other (comment)    Precaution Comments  Low vision- left eye blind      Restrictions   Weight Bearing Restrictions  Yes    RLE Weight Bearing  Weight bearing as tolerated               OT Treatments/Exercises (OP) - 04/29/17 1519      Exercises   Exercises  Shoulder;Hand      Shoulder Exercises: Seated   Extension  Theraband;10 reps    Theraband Level (Shoulder Extension)  Level 2 (Red)    Retraction  Theraband;10 reps    Theraband Level (Shoulder Retraction)  Level 2 (Red)    Row  Theraband;10 reps    Theraband Level (Shoulder Row)  Level 2 (Red)    Protraction  Strengthening;10 reps    Protraction Weight (lbs)  1    Horizontal ABduction  Strengthening;10 reps    Horizontal ABduction Weight (lbs)  1    External Rotation  Strengthening;Theraband;10 reps    External Rotation Weight (lbs)  1    Internal Rotation  Strengthening;Theraband;10 reps    Internal Rotation Weight (lbs)  1    Flexion  Strengthening;10 reps    Flexion Weight (lbs)  1    Abduction  Strengthening;10 reps    ABduction Weight (lbs)  1    Other Seated Exercises  small green therapy ball: chest press, overhead press, ball circles to each direction, 10X each       Shoulder Exercises: ROM/Strengthening   UBE (Upper Arm Bike)  Level 1 2' forward 2' reverse    X to V Arms   10X, 1#    Proximal Shoulder Strengthening, Seated  10X each, 1#, no rest break      Hand Exercises   Hand Gripper with Large Beads  left: all beads gripper at 35#; right: all beads gripper at 38#    Hand Gripper with Medium Beads  left: all beads gripper at 25#; right: all beads gripper at 29#               OT Short Term Goals - 04/24/17 1559      OT SHORT TERM GOAL #1   Title  Pt will be educated and independent with HEP to increase functional performance and endurance during ADL tasks.    Time  4    Period  Weeks    Status  On-going      OT SHORT TERM GOAL #2   Title  Patient will increase her grip and pinch strength to be within average norms in order to complete gripping tasks such as opening containers and jars with less difficulty.     Time  4    Period  Weeks    Status  On-going      OT SHORT TERM GOAL #3   Title  Patient will increase her BUE strength to 5/5 to increase functional performance during daily tasks.     Time  4    Period  Weeks    Status  On-going      OT SHORT TERM GOAL #4   Title  Patient will decrease fascial restrictions to min amount to increase functional mobility and comfort level when completing functional daily tasks.     Time  4    Period  Weeks    Status  On-going               Plan - 04/29/17 1548    Clinical Impression Statement  A: Session continued to focus on BUE strengthening and grip strengthening. Added circles with small green therapy ball and UBE this session, also added weight to x to v arms and proximal shoulder strengthening. Pt requiring occasional cuing for form and technique, rest breaks provided for fatigue as needed. Pt able to increase gripper resistance for right hand.     Plan  P: Reassess and discharge  per pt request; update HEP    Consulted and Agree with Plan of Care  Patient       Patient will benefit from skilled therapeutic intervention in order to improve the following deficits and impairments:   Pain, Impaired UE functional use, Increased fascial restrictions, Decreased strength  Visit Diagnosis: Other symptoms and signs involving the musculoskeletal system  Acute pain of left shoulder    Problem List Patient Active Problem List   Diagnosis Date Noted  . S/P arthroscopy of left shoulder 11/14/16 04/29/2017  . Arthrosis of left acromioclavicular joint   . Rotator cuff syndrome of left shoulder   . Primary osteoarthritis of left shoulder   . Surgical aftercare, musculoskeletal system 10/11/2014  . Status post total left knee replacement 10/11/2014  . Arthritis of left knee 09/14/2014  . Knee contusion 12/27/2013  . Medial meniscus, posterior horn derangement 12/27/2013  . Arthritis of knee, left 12/27/2013  . Left knee pain 12/27/2013  . 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 KNEE PAIN 05/11/2007  . DISC DEGENERATION 05/11/2007  . SCIATICA 05/11/2007   Ezra SitesLeslie Verneda Hollopeter, OTR/L  [phone] 04/29/2017, 4:57 PM  [facility] [address] [place], [name], [address] Phone: [phone]   Fax:  [phone]  Name: Silvano RuskBobbie W Benton MRN: [id number] Date of Birth: [birth year]

## 2017-04-30 ENCOUNTER — Ambulatory Visit (INDEPENDENT_AMBULATORY_CARE_PROVIDER_SITE_OTHER): Payer: Medicare Other | Admitting: Orthopedic Surgery

## 2017-04-30 ENCOUNTER — Encounter: Payer: Self-pay | Admitting: Orthopedic Surgery

## 2017-04-30 VITALS — BP 129/63 | HR 59 | Ht 62.0 in | Wt 123.0 lb

## 2017-04-30 DIAGNOSIS — M19012 Primary osteoarthritis, left shoulder: Secondary | ICD-10-CM

## 2017-04-30 DIAGNOSIS — M19011 Primary osteoarthritis, right shoulder: Secondary | ICD-10-CM

## 2017-04-30 DIAGNOSIS — Z9889 Other specified postprocedural states: Secondary | ICD-10-CM | POA: Diagnosis not present

## 2017-04-30 NOTE — Progress Notes (Signed)
Progress Note   Patient ID: Alyssa Poole, female   DOB: 11/30/1960, 57 y.o.   MRN: [id number]  Chief Complaint  Patient presents with  . Follow-up    Recheck on left shoulder, DOS 11-14-16.    57 years old status post arthroscopy left shoulder where we found glenohumeral arthritis she had a extensive debridement.  She comes in complaining of pain over the [facility].  She also had a stroke she is in physical therapy for the stroke soon to be released from that  She complains of pain over the [facility] which is constant moderate dull aching and not associated with any range of motion deficits     Review of Systems  Constitutional: Negative for chills and fever.  Respiratory: Negative for shortness of breath.   Cardiovascular: Negative for chest pain.  Neurological: Negative for tingling.   Current Meds  Medication Sig  . alendronate (FOSAMAX) 70 MG tablet Take 70 mg by mouth every 7 (seven) days. Takes on Sunday. Take with a full glass of water on an empty stomach.  Marland Kitchen. aspirin EC 81 MG tablet Take 81 mg by mouth daily.  . Black Cohosh 540 MG CAPS Take 1 capsule by mouth daily.  . cetirizine (ZYRTEC) 10 MG tablet Take 10 mg by mouth daily.  . citalopram (CELEXA) 20 MG tablet Take 20 mg by mouth daily.  . clopidogrel (PLAVIX) 300 MG TABS tablet Take 300 mg by mouth once.  . [employer] CALCIUM 600+D 600-800 MG-UNIT TABS Take 1 tablet by mouth daily.  Marland Kitchen. gabapentin (NEURONTIN) 300 MG capsule Take 300 mg by mouth 3 (three) times daily.   . metoprolol tartrate (LOPRESSOR) 25 MG tablet TK 1 T PO BID  . Multiple Vitamin (MULTIVITAMIN WITH MINERALS) TABS tablet Take 1 tablet by mouth daily.  . pantoprazole (PROTONIX) 40 MG tablet Take 40 mg by mouth Daily.   . SOYBEAN PO Take by mouth.  . topiramate (TOPAMAX) 100 MG tablet Take 100 mg by mouth at bedtime.    Past Medical History:  Diagnosis Date  . Anxiety   . Arthritis   . Asthma   . Chronic headaches   . Chronic neck pain   .  Depression 2002  . Dysconjugate gaze   . GERD (gastroesophageal reflux disease)   . Hypertension   . Neuromuscular disorder (HCC)   . Osteoporosis   . Panic attacks 2002     Allergies  Allergen Reactions  . Bee Venom Swelling    Causes bad swelling. No epipen  needed    BP 129/63   Pulse (!) 59   Ht 5\' 2"  (1.575 m)   Wt 123 lb (55.8 kg)   BMI 22.50 kg/m    Physical Exam  Constitutional: She is oriented to person, place, and time. She appears well-developed and well-nourished.  Neurological: She is alert and oriented to person, place, and time.  Psychiatric: She has a normal mood and affect. Judgment normal.  Vitals reviewed.   Right Shoulder Exam   Tenderness  The patient is experiencing no tenderness.  Range of Motion  The patient has normal right shoulder ROM.  Muscle Strength  The patient has normal right shoulder strength.  Tests  Apprehension: negative  Other  Erythema: absent Sensation: normal Pulse: present   Left Shoulder Exam   Tenderness  The patient is experiencing tenderness in the acromioclavicular joint.  Range of Motion  The patient has normal left shoulder ROM.  Muscle Strength  The  patient has normal left shoulder strength.  Tests  Apprehension: negative Cross arm: positive  Other  Erythema: absent Scars: absent Sensation: normal Pulse: present       PRE-OPERATIVE DIAGNOSIS:  left rotator cuff tear   POST-OPERATIVE DIAGNOSIS:  Bursitis left shoulder, rotator cuff tendinitis tendinosis, acromioclavicular joint arthrosis, glenohumeral joint arthrosis, glenohumeral synovitis   PROCEDURE:  Procedure(s): SHOULDER ARTHROSCOPY  DISTAL CLAVICLE excision with acromioplasty subacromial bursectomy and glenohumeral joint limited debridement  (Left)   Operative findings the glenohumeral joint was arthritic there was degenerative fraying of the anterior labrum with synovitis and degenerative fraying of the subscapularis is a small  undersurface rotator cuff abrasion without detachment   The subacromial bursa was inflamed and thickened, the acromion had a medial spur in the distal clavicle had an inferior spur   The superior surface of the rotator cuff had evidence of tendinosis without tear    Encounter Diagnoses  Name Primary?  . Primary osteoarthritis of left shoulder Yes  . S/P arthroscopy of left shoulder 11/14/16   . Arthrosis of right acromioclavicular joint    Verbal consent was given timeout confirmed left shoulder as injection site at the acromio clavicular joint acromial A steroid injection was performed at clavicular joint using 1% plain Lidocaine and 40 mg of Depo-Medrol. This was well tolerated.   Follow-up in 4 weeks to check the right foot out of plaster and recheck left shoulder  [name], MD 04/30/2017 11:23 AM

## 2017-05-01 ENCOUNTER — Encounter (HOSPITAL_COMMUNITY): Payer: Medicare Other | Admitting: Speech Pathology

## 2017-05-01 ENCOUNTER — Ambulatory Visit (HOSPITAL_COMMUNITY): Payer: Medicare Other | Admitting: Physical Therapy

## 2017-05-01 ENCOUNTER — Ambulatory Visit (HOSPITAL_COMMUNITY): Payer: Medicare Other

## 2017-05-01 ENCOUNTER — Encounter (HOSPITAL_COMMUNITY): Payer: Self-pay

## 2017-05-01 ENCOUNTER — Other Ambulatory Visit: Payer: Self-pay

## 2017-05-01 DIAGNOSIS — R41841 Cognitive communication deficit: Secondary | ICD-10-CM | POA: Diagnosis not present

## 2017-05-01 DIAGNOSIS — R29898 Other symptoms and signs involving the musculoskeletal system: Secondary | ICD-10-CM

## 2017-05-01 DIAGNOSIS — M25512 Pain in left shoulder: Secondary | ICD-10-CM

## 2017-05-01 NOTE — Therapy (Signed)
[place], [place], [address] Phone: [phone]   Fax:  [phone]  Occupational Therapy Treatment  Patient Details  Name: Alyssa Poole MRN: [id number] Date of Birth: [birth year] Referring Provider: Levada Schilling MD   Encounter Date: 05/01/2017  OT End of Session - 05/01/17 1727    Visit Number  11    Number of Visits  14    Date for OT Re-Evaluation  05/10/17    Authorization Type  1) Medicare 2) Medicaid    OT Start Time  1648 reassess and discharge    OT Stop Time  1716    OT Time Calculation (min)  28 min    Activity Tolerance  Patient tolerated treatment well    Behavior During Therapy  [facility] for tasks assessed/performed       Past Medical History:  Diagnosis Date  . Anxiety   . Arthritis   . Asthma   . Chronic headaches   . Chronic neck pain   . Depression 2002  . Dysconjugate gaze   . GERD (gastroesophageal reflux disease)   . Hypertension   . Neuromuscular disorder ([place])   . Osteoporosis   . Panic attacks 2002    Past Surgical History:  Procedure Laterality Date  . BACK SURGERY     x4  . CERVICAL SPINE SURGERY     plate in neck  . CHOLECYSTECTOMY    . KNEE ARTHROSCOPY WITH MEDIAL MENISECTOMY Left 03/09/2014   Procedure: LEFT KNEE ARTHROSCOPY WITH PARTIAL MEDIAL MENISECTOMY;  Surgeon: Carole Civil, MD;  Location: [facility];  Service: Orthopedics;  Laterality: Left;  . SHOULDER ARTHROSCOPY Left 11/14/2016   Procedure: ARTHROSCOPY SHOULDER WITH LIMITED DEDBRIDEMENT AND ACROMIOPLASTY AND DISTAL CLAVICAL EXCISION;  Surgeon: Carole Civil, MD;  Location: [facility];  Service: Orthopedics;  Laterality: Left;  . TOTAL KNEE ARTHROPLASTY Left 09/14/2014   Procedure: LEFT TOTAL KNEE ARTHROPLASTY;  Surgeon: Carole Civil, MD;  Location: [facility];  Service: Orthopedics;  Laterality: Left;    There were no vitals filed for this visit.  Subjective Assessment - 05/01/17 1710    Subjective    S: I got a shot yesterday in my shoulder and it has really helped.     Currently in Pain?  No/denies         [facility] OT Assessment - 05/01/17 1650      Assessment   Medical Diagnosis  Post-stroke SAH impaired mobility and ADLs cognitive deficits post stroke      Precautions   Precautions  Other (comment)    Precaution Comments  Low vision- left eye blind      Strength   Strength Assessment Site  Hand;Shoulder    Right/Left Shoulder  Left;Right    Right Shoulder Flexion  5/5 previous: 4/5    Right Shoulder ABduction  5/5 previous: 4/5    Right Shoulder Internal Rotation  5/5 previous: 4+/5    Right Shoulder External Rotation  5/5 previous: 4+/5    Left Shoulder Flexion  5/5 previous: 4/5    Left Shoulder ABduction  5/5 previous: 4/5    Left Shoulder Internal Rotation  5/5 previous: 4+/5    Left Shoulder External Rotation  5/5 previous: 4+/5    Right/Left hand  Right;Left    Right Hand Grip (lbs)  45 previous: same    Right Hand Lateral Pinch  11 lbs previous: 10    Right Hand 3 Point Pinch  10 lbs previous: 11  Left Hand Grip (lbs)  45 previous: 33    Left Hand Lateral Pinch  12 lbs previous: 8    Left Hand 3 Point Pinch  10 lbs previous: 9               OT Treatments/Exercises (OP) - 05/01/17 1711      Exercises   Exercises  Shoulder      Shoulder Exercises: Seated   Horizontal ABduction  Theraband;10 reps    Theraband Level (Shoulder Horizontal ABduction)  Level 2 (Red)    External Rotation  Theraband;10 reps    Theraband Level (Shoulder External Rotation)  Level 2 (Red)    Internal Rotation  Theraband;10 reps    Theraband Level (Shoulder Internal Rotation)  Level 2 (Red)    Abduction  Theraband;10 reps    Theraband Level (Shoulder ABduction)  Level 2 (Red)             OT Education - 05/01/17 1726    Education provided  Yes    Education Details  Red band shoulder strengthening. green theraputty. Reviewed goals.     Person(s) Educated  Patient     Methods  Explanation;Demonstration;Handout;Verbal cues    Comprehension  Returned demonstration;Verbalized understanding       OT Short Term Goals - 05/01/17 1656      OT SHORT TERM GOAL #1   Title  Pt will be educated and independent with HEP to increase functional performance and endurance during ADL tasks.    Time  4    Period  Weeks    Status  Achieved      OT SHORT TERM GOAL #2   Title  Patient will increase her grip and pinch strength to be within average norms in order to complete gripping tasks such as opening containers and jars with less difficulty.     Time  4    Period  Weeks    Status  Achieved      OT SHORT TERM GOAL #3   Title  Patient will increase her BUE strength to 5/5 to increase functional performance during daily tasks.     Time  4    Period  Weeks    Status  Achieved      OT SHORT TERM GOAL #4   Title  Patient will decrease fascial restrictions to min amount to increase functional mobility and comfort level when completing functional daily tasks.     Time  4    Period  Weeks    Status  Achieved               Plan - 05/01/17 1728    Clinical Impression Statement  A: Reassessment completed this date. patient has met all therapy goals. Reports that she is doing all daily tasks without issues. HEP was updated. She is in agreement with discharge.     Plan  P: D/C from OT services with HEP.    Consulted and Agree with Plan of Care  Patient       Patient will benefit from skilled therapeutic intervention in order to improve the following deficits and impairments:  Pain, Impaired UE functional use, Increased fascial restrictions, Decreased strength  Visit Diagnosis: Other symptoms and signs involving the musculoskeletal system  Acute pain of left shoulder    Problem List Patient Active Problem List   Diagnosis Date Noted  . S/P arthroscopy of left shoulder 11/14/16 04/29/2017  . Arthrosis of left acromioclavicular joint   . Rotator cuff  syndrome of left shoulder   . Primary osteoarthritis of left shoulder   . Surgical aftercare, musculoskeletal system 10/11/2014  . Status post total left knee replacement 10/11/2014  . Arthritis of left knee 09/14/2014  . Knee contusion 12/27/2013  . Medial meniscus, posterior horn derangement 12/27/2013  . Arthritis of knee, left 12/27/2013  . Left knee pain 12/27/2013  . 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 KNEE PAIN 05/11/2007  . [place] DEGENERATION 05/11/2007  . SCIATICA 05/11/2007    OCCUPATIONAL THERAPY DISCHARGE SUMMARY  Visits from Start of Care: 11  Current functional level related to goals / functional outcomes: See above   Remaining deficits: See above   Education / Equipment: See above Plan: Patient agrees to discharge.  Patient goals were met. Patient is being discharged due to meeting the stated rehab goals.  ?????         Ailene Ravel, OTR/L,CBIS  [phone]  05/01/2017, 5:31 PM  [place], [place], [address] Phone: [phone]   Fax:  [phone]  Name: SAYLAH KETNER MRN: [id number] Date of Birth: [birth year]

## 2017-05-01 NOTE — Patient Instructions (Signed)
Strengthening: Chest Pull - Resisted   Hold Theraband in front of body with hands about shoulder width a part. Pull band a part and back together slowly. Repeat ____ times. Complete ____ set(s) per session.. Repeat ____ session(s) per day.  http://orth.exer.us/926   Copyright  VHI. All rights reserved.   PNF Strengthening: Resisted   Standing with resistive band around each hand, bring right arm up and away, thumb back. Repeat ____ times per set. Do ____ sets per session. Do ____ sessions per day.                           Resisted External Rotation: in Neutral - Bilateral   Sit or stand, tubing in both hands, elbows at sides, bent to 90, forearms forward. Pinch shoulder blades together and rotate forearms out. Keep elbows at sides. Repeat ____ times per set. Do ____ sets per session. Do ____ sessions per day.  http://orth.exer.us/966   Copyright  VHI. All rights reserved.   PNF Strengthening: Resisted   Standing, hold resistive band above head. Bring right arm down and out from side. Repeat ____ times per set. Do ____ sets per session. Do ____ sessions per day.  http://orth.exer.us/922   Copyright  VHI. All rights reserved.  

## 2017-05-21 ENCOUNTER — Ambulatory Visit: Payer: Medicare Other | Admitting: Orthopedic Surgery

## 2017-05-21 IMAGING — DX DG FOOT COMPLETE 3+V*R*
3 series · 3 of 3 positions shown · non-contrast
Comparison: 09/19/2013.  08/29/2011

CLINICAL DATA: Foot pain.  No known injury.

EXAM:
RIGHT FOOT COMPLETE - 3+ VIEW

[foot ap]
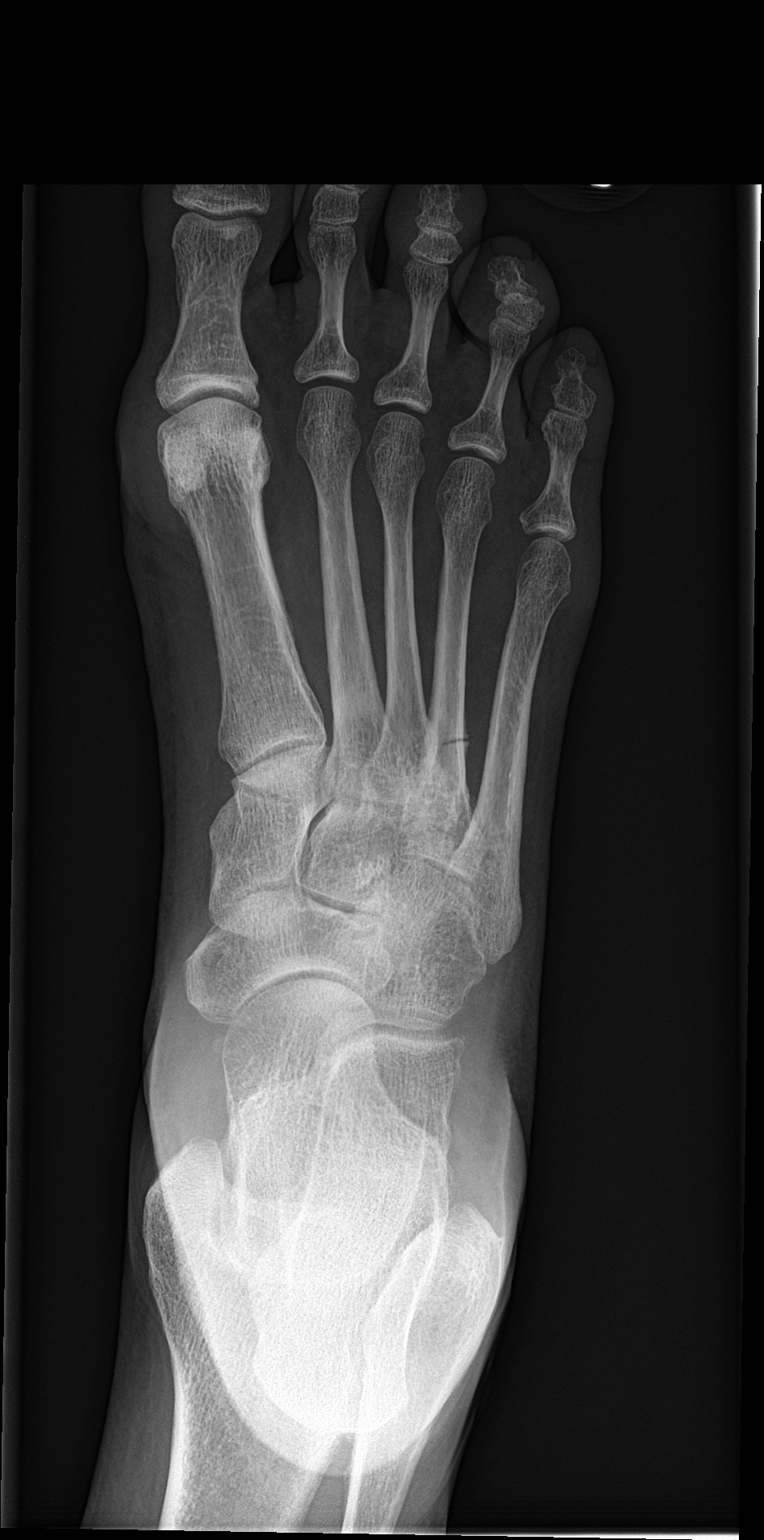

[foot obl]
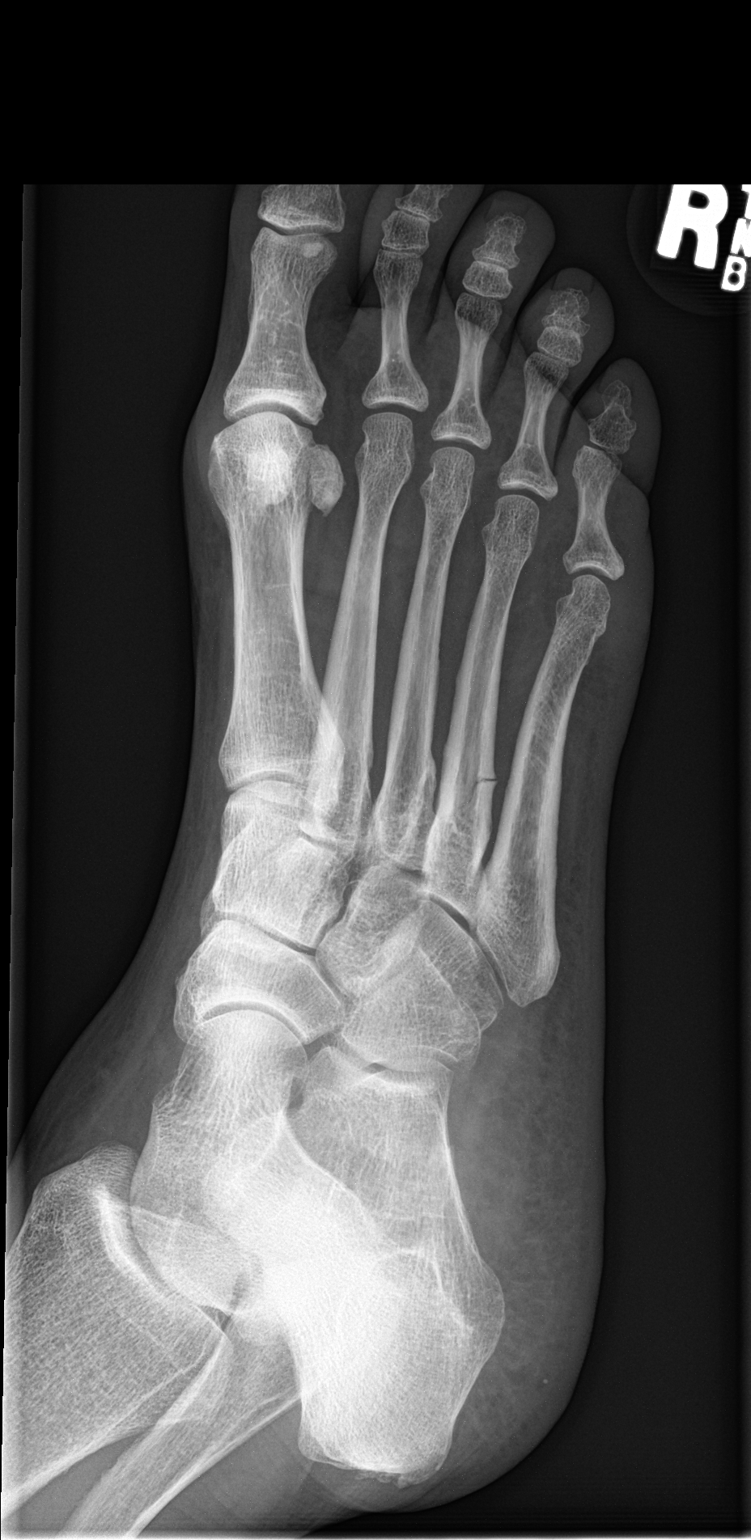

[foot lat]
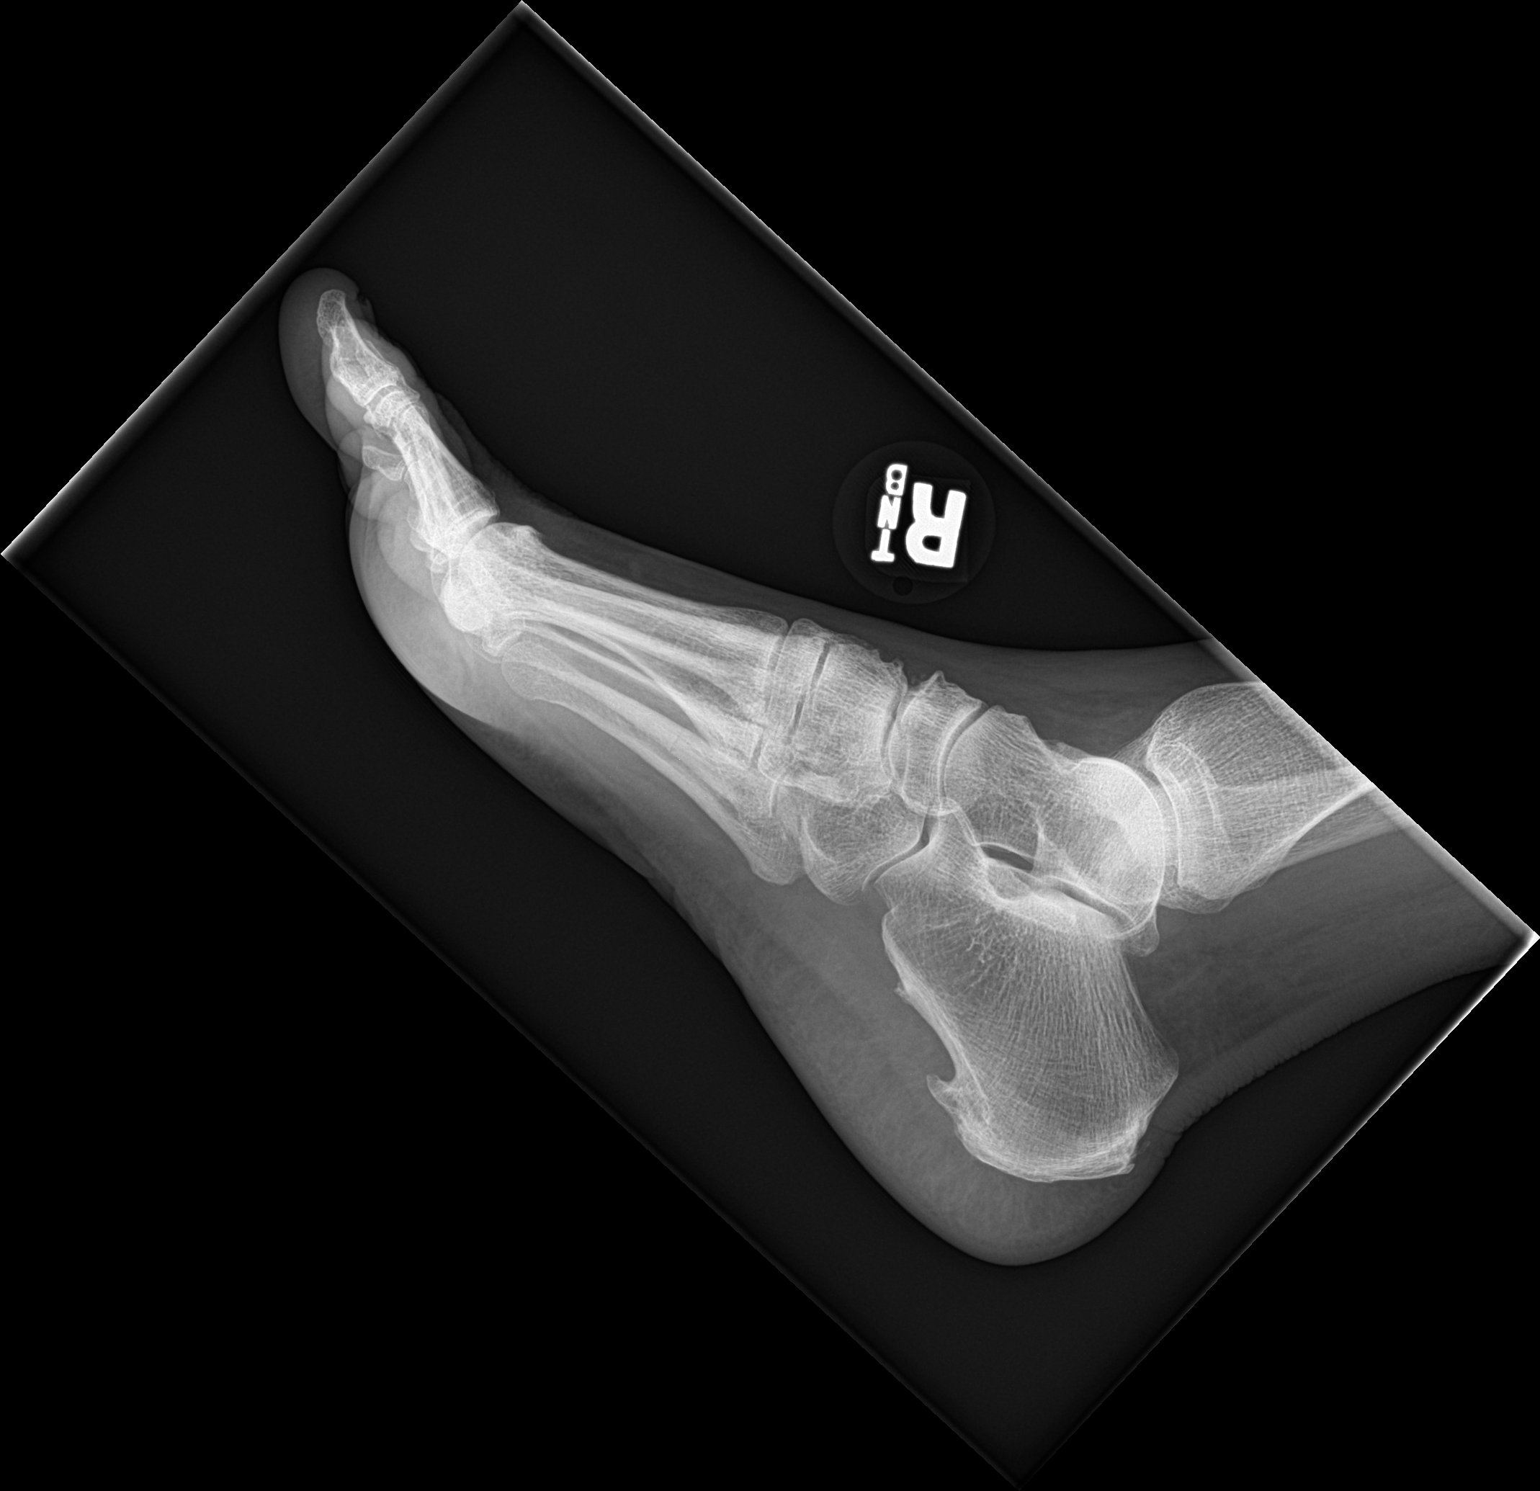

[3 of 3 positions shown; findings below may reference images not displayed]

FINDINGS: Nondisplaced fracture at the base of the right fourth metatarsal is
noted. This could be a stress fracture. No other focal abnormalities
identified. Diffuse degenerative change. No radiopaque foreign
bodies.
IMPRESSION: Tiny nondisplaced fracture of the base of the right fourth
metatarsal is noted. This could be a stress fracture.

## 2017-05-28 ENCOUNTER — Ambulatory Visit (INDEPENDENT_AMBULATORY_CARE_PROVIDER_SITE_OTHER): Payer: Medicare Other | Admitting: Orthopedic Surgery

## 2017-05-28 ENCOUNTER — Encounter: Payer: Self-pay | Admitting: Orthopedic Surgery

## 2017-05-28 ENCOUNTER — Ambulatory Visit (INDEPENDENT_AMBULATORY_CARE_PROVIDER_SITE_OTHER): Payer: Medicare Other

## 2017-05-28 VITALS — BP 115/76 | HR 67 | Ht 62.0 in | Wt 123.0 lb

## 2017-05-28 DIAGNOSIS — Z96652 Presence of left artificial knee joint: Secondary | ICD-10-CM

## 2017-05-28 DIAGNOSIS — S92901D Unspecified fracture of right foot, subsequent encounter for fracture with routine healing: Secondary | ICD-10-CM

## 2017-05-28 NOTE — Progress Notes (Signed)
Fracture care follow-up  Chief Complaint  Patient presents with  . Foot Pain    cast since 04/09/17 Right foot   . Shoulder Pain    left shoulder injection did not help     Encounter Diagnoses  Name Primary?  . Status post total left knee replacement 09/14/14 Yes  . Closed fracture of right foot with routine healing, subsequent encounter    Patient comes in for stress fracture right foot for x-ray and also reevaluation of left shoulder  She says her foot feels better but her left shoulder is still hurting she did get relief times 1 week with his left shoulder AC joint injection  Review of systems she denies chest pain or shortness of breath she says she has been feeling good lately     Current Outpatient Medications:  .  alendronate (FOSAMAX) 70 MG tablet, Take 70 mg by mouth every 7 (seven) days. Takes on Sunday. Take with a full glass of water on an empty stomach., Disp: , Rfl:  .  aspirin EC 81 MG tablet, Take 81 mg by mouth daily., Disp: , Rfl:  .  Black Cohosh 540 MG CAPS, Take 1 capsule by mouth daily., Disp: , Rfl:  .  cetirizine (ZYRTEC) 10 MG tablet, Take 10 mg by mouth daily., Disp: , Rfl:  .  citalopram (CELEXA) 20 MG tablet, Take 20 mg by mouth daily., Disp: , Rfl: 6 .  clopidogrel (PLAVIX) 300 MG TABS tablet, Take 300 mg by mouth once., Disp: , Rfl:  .  [employer] CALCIUM 600+D 600-800 MG-UNIT TABS, Take 1 tablet by mouth daily., Disp: , Rfl: 11 .  gabapentin (NEURONTIN) 300 MG capsule, Take 300 mg by mouth 3 (three) times daily. , Disp: , Rfl:  .  metoprolol tartrate (LOPRESSOR) 25 MG tablet, TK 1 T PO BID, Disp: , Rfl: 5 .  Multiple Vitamin (MULTIVITAMIN WITH MINERALS) TABS tablet, Take 1 tablet by mouth daily., Disp: , Rfl:  .  oxyCODONE-acetaminophen (PERCOCET) 10-325 MG tablet, Take 1 tablet by mouth every 4 (four) hours as needed for pain., Disp: , Rfl:  .  pantoprazole (PROTONIX) 40 MG tablet, Take 40 mg by mouth Daily. , Disp: , Rfl:  .  potassium chloride SA  (K-DUR,KLOR-CON) 20 MEQ tablet, Take 2 tablets (40 mEq total) by mouth 3 (three) times daily., Disp: 6 tablet, Rfl: 0 .  SOYBEAN PO, Take by mouth., Disp: , Rfl:  .  topiramate (TOPAMAX) 100 MG tablet, Take 100 mg by mouth at bedtime., Disp: , Rfl: 3 .  oxyCODONE-acetaminophen (ROXICET) 5-325 MG tablet, Take 1 tablet by mouth every 4 (four) hours as needed for severe pain. (Patient not taking: Reported on 05/28/2017), Disp: 30 tablet, Rfl: 0  BP 115/76   Pulse 67   Ht 5\' 2"  (1.575 m)   Wt 123 lb (55.8 kg)   BMI 22.50 kg/m   Physical Exam  Constitutional: She is oriented to person, place, and time. She appears well-developed and well-nourished.  Musculoskeletal:       Arms: Neurological: She is alert and oriented to person, place, and time.  Psychiatric: She has a normal mood and affect. Judgment normal.  Vitals reviewed. There are no neurovascular deficits or lymph node enlargement in the  Her left upper extremity  Right foot is nontender she is ambulatory with no limp.  Bony callus is noted around the fracture and there is no tenderness there 1 April  Radiology report dictated by Dr. Romeo AppleHarrison at [facility]  X-ray of the right foot evaluating a right fourth medical tarsal fracture.  There is complete callus formation around the fracture on all x-rays there is no displacement or malalignment  The fracture is radiographically healed.   Left shoulder still tender over the acromioclavicular joint she did get injection relief times a week but still tender there today with a positive AC joint stress test  We have opted to do an open distal clavicle release sometime in the March or 1st of  April

## 2017-06-03 ENCOUNTER — Telehealth: Payer: Self-pay | Admitting: Radiology

## 2017-06-03 NOTE — Telephone Encounter (Signed)
-----   Message from Nobie Putnamarolyn H Young sent at 06/03/2017  8:22 AM EST ----- Regarding: surgery I just spoke with her and she said she is going to reschedule her surgery.  When you talk to her let me know where to move it to,  Thanks.

## 2017-06-03 NOTE — Telephone Encounter (Signed)
Patient has decided to cancel surgery. She will let us know if she decides to proceed, but not ready now. To you FYI

## 2017-06-26 ENCOUNTER — Ambulatory Visit: Admit: 2017-06-26 | Payer: Medicare Other | Admitting: Orthopedic Surgery

## 2017-06-26 SURGERY — EXCISION, CLAVICLE, DISTAL, OPEN
Anesthesia: General | Laterality: Left

## 2017-10-15 ENCOUNTER — Ambulatory Visit (INDEPENDENT_AMBULATORY_CARE_PROVIDER_SITE_OTHER): Payer: Medicare HMO

## 2017-10-15 ENCOUNTER — Encounter: Payer: Self-pay | Admitting: Orthopedic Surgery

## 2017-10-15 ENCOUNTER — Ambulatory Visit (INDEPENDENT_AMBULATORY_CARE_PROVIDER_SITE_OTHER): Payer: Medicare HMO | Admitting: Orthopedic Surgery

## 2017-10-15 VITALS — BP 154/79 | HR 57 | Ht 62.0 in | Wt 126.0 lb

## 2017-10-15 DIAGNOSIS — M5442 Lumbago with sciatica, left side: Secondary | ICD-10-CM

## 2017-10-15 DIAGNOSIS — M25552 Pain in left hip: Secondary | ICD-10-CM

## 2017-10-15 DIAGNOSIS — G8929 Other chronic pain: Secondary | ICD-10-CM

## 2017-10-15 NOTE — Progress Notes (Signed)
Progress Note   Patient ID: Alyssa Poole, female   DOB: [birth year], 57 y.o.   MRN: [id number] Chief Complaint  Patient presents with  . Hip Pain    left ischial area painful feels like sitting on a rock  . Back Pain    pain when rolling ofver in bed      Chief Complaint  Patient presents with  . Hip Pain    left ischial area painful feels like sitting on a rock  . Back Pain    pain when rolling ofver in bed     57 years old history of lumbar disc surgery previously seen by Dr. Venetia MaxonStern comes in complaining of left hip pain and shifting of the bones when she rolls over onto her right side  Pain is located over the left gluteal area. Increasing over 6-week time Quality dull ache Severity moderate Associated with feeling of motion in the lumbar spine    Review of Systems  Constitutional: Negative for chills, fever, malaise/fatigue and weight loss.  Gastrointestinal: Negative for constipation.       Denies loss bowel control   Genitourinary:       Denies urinary retention or los of bladder control   Neurological: Negative for tingling and sensory change.   Current Meds  Medication Sig  . alendronate (FOSAMAX) 70 MG tablet Take 70 mg by mouth every 7 (seven) days. Takes on Sunday. Take with a full glass of water on an empty stomach.  Marland Kitchen. aspirin EC 325 MG tablet Take by mouth.  . Black Cohosh 540 MG CAPS Take 1 capsule by mouth daily.  . citalopram (CELEXA) 20 MG tablet Take 20 mg by mouth daily.  . [employer]S CALCIUM 600+D 600-800 MG-UNIT TABS Take 1 tablet by mouth daily.  Marland Kitchen. gabapentin (NEURONTIN) 300 MG capsule Take 300 mg by mouth 3 (three) times daily.   . metoprolol tartrate (LOPRESSOR) 25 MG tablet TK 1 T PO BID  . pantoprazole (PROTONIX) 40 MG tablet Take 40 mg by mouth Daily.   Marland Kitchen. topiramate (TOPAMAX) 25 MG tablet TK 1 T PO HS    Past Medical History:  Diagnosis Date  . Anxiety   . Arthritis   . Asthma   . Chronic headaches   . Chronic neck pain   . Depression 2002   . Dysconjugate gaze   . GERD (gastroesophageal reflux disease)   . Hypertension   . Neuromuscular disorder (HCC)   . Osteoporosis   . Panic attacks 2002     Allergies  Allergen Reactions  . Bee Venom Swelling    Causes bad swelling. No epipen  needed     BP (!) 154/79   Pulse (!) 57   Ht 5\' 2"  (1.575 m)   Wt 126 lb (57.2 kg)   BMI 23.05 kg/m    Physical Exam  Constitutional: She is oriented to person, place, and time. She appears well-developed and well-nourished.  Neurological: She is alert and oriented to person, place, and time.  Psychiatric: She has a normal mood and affect. Judgment normal.  Vitals reviewed.   Right Hip Exam  Right hip exam is normal.   Tenderness  The patient is experiencing no tenderness.   Range of Motion  The patient has normal right hip ROM.  Muscle Strength  The patient has normal right hip strength.  Tests  FABER: negative  Other  Erythema: absent Sensation: normal Pulse: present  Comments:  HIP STABILITY NORMAL    Left Hip Exam  Left hip exam is normal.  Tenderness  The patient is experiencing no tenderness.   Range of Motion  The patient has normal left hip ROM.  Muscle Strength  The patient has normal left hip strength.   Tests  FABER: negative  Other  Erythema: absent Sensation: normal Pulse: present  Comments:  HIP STABILITY NORMAL    Back Exam   Tenderness  Back tenderness location: Of the left SI joint left buttock left lower back and midline lower back.      [name], MD   MEDICAL DECISION MAKING   Imaging:  Pelvis [facility] both hips normal  Lumbar spine degenerative spondylosis and scoliosis especially at L5-S1    NEW PROBLEM  Encounter Diagnoses  Name Primary?  . Left-sided ischial pain Yes  . Chronic midline low back pain with left-sided sciatica      PLAN: (RX., injection, surgery,frx,mri/ct, XR 2 body ares) The patient has no evidence of hip arthritis patient will  be referred back to neurosurgery request Dr. Yetta Barre who is seeing her husband.  No orders of the defined types were placed in this encounter.  10:53 AM 10/15/2017

## 2017-10-31 ENCOUNTER — Emergency Department (HOSPITAL_COMMUNITY)
Admission: EM | Admit: 2017-10-31 | Discharge: 2017-10-31 | Disposition: A | Discharge status: Home / Self Care | Payer: Medicare HMO | Attending: Emergency Medicine | Admitting: Emergency Medicine

## 2017-10-31 ENCOUNTER — Other Ambulatory Visit: Payer: Self-pay

## 2017-10-31 ENCOUNTER — Encounter (HOSPITAL_COMMUNITY): Payer: Self-pay | Admitting: Emergency Medicine

## 2017-10-31 DIAGNOSIS — K0889 Other specified disorders of teeth and supporting structures: Secondary | ICD-10-CM | POA: Diagnosis not present

## 2017-10-31 DIAGNOSIS — J45909 Unspecified asthma, uncomplicated: Secondary | ICD-10-CM | POA: Insufficient documentation

## 2017-10-31 DIAGNOSIS — I10 Essential (primary) hypertension: Secondary | ICD-10-CM | POA: Insufficient documentation

## 2017-10-31 DIAGNOSIS — Z7902 Long term (current) use of antithrombotics/antiplatelets: Secondary | ICD-10-CM | POA: Insufficient documentation

## 2017-10-31 DIAGNOSIS — Z7982 Long term (current) use of aspirin: Secondary | ICD-10-CM | POA: Insufficient documentation

## 2017-10-31 DIAGNOSIS — Z79899 Other long term (current) drug therapy: Secondary | ICD-10-CM | POA: Insufficient documentation

## 2017-10-31 DIAGNOSIS — Z87891 Personal history of nicotine dependence: Secondary | ICD-10-CM | POA: Insufficient documentation

## 2017-10-31 MED ORDER — AMOXICILLIN 500 MG PO CAPS
500.0000 mg | ORAL_CAPSULE | Freq: Three times a day (TID) | ORAL | 0 refills | Status: DC
Start: 2017-10-31 — End: 2019-07-06

## 2017-10-31 MED ORDER — DICLOFENAC SODIUM 50 MG PO TBEC: 50.0000 mg | DELAYED_RELEASE_TABLET | Freq: Two times a day (BID) | ORAL | 0 refills | Status: DC

## 2017-10-31 NOTE — Discharge Instructions (Signed)
See your dentist as soon as possible for evaluation

## 2017-10-31 NOTE — ED Notes (Signed)
Pt reports dental pain to L upper jaw for the last 2 days after filling came out   She reports that she cannot see her dentist for the next 2 weeks

## 2017-10-31 NOTE — ED Provider Notes (Signed)
[facility]ENCY [facility]PARTMENT Provider Note   CSN: [id number] Arrival date & time: 10/31/17  1538     History   Chief Complaint Chief Complaint  Patient presents with  . Dental Pain    HPI Alyssa Poole is a 57 y.o. female.  The history is provided by the patient. No language interpreter was used.  Dental Pain   This is a new problem. The current episode started more than 2 days ago. The problem occurs constantly. The problem has been rapidly worsening. The pain is moderate. She has tried nothing for the symptoms. The treatment provided no relief.    Past Medical History:  Diagnosis Date  . Anxiety   . Arthritis   . Asthma   . Chronic headaches   . Chronic neck pain   . Depression 2002  . Dysconjugate gaze   . GERD (gastroesophageal reflux disease)   . Hypertension   . Neuromuscular disorder (HCC)   . Osteoporosis   . Panic attacks 2002    Patient Active Problem List   Diagnosis Date Noted  . S/P arthroscopy of left shoulder 11/14/16 04/29/2017  . Arthrosis of left acromioclavicular joint   . Rotator cuff syndrome of left shoulder   . Primary osteoarthritis of left shoulder   . Surgical aftercare, musculoskeletal system 10/11/2014  . Status post total left knee replacement 09/14/14 10/11/2014  . Knee contusion 12/27/2013  . Medial meniscus, posterior horn derangement 12/27/2013  . Left knee pain 12/27/2013  . 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 KNEE PAIN 05/11/2007  . DISC DEGENERATION 05/11/2007  . SCIATICA 05/11/2007    Past Surgical History:  Procedure Laterality Date  . BACK SURGERY     x4  . CERVICAL SPINE SURGERY     plate in neck  . CHOLECYSTECTOMY    . KNEE ARTHROSCOPY WITH MEDIAL MENISECTOMY Left 03/09/2014   Procedure: LEFT KNEE ARTHROSCOPY WITH PARTIAL MEDIAL MENISECTOMY;  Surgeon: Vickki HearingStanley E Harrison, MD;  Location: [facility];  Service: Orthopedics;  Laterality: Left;  . SHOULDER ARTHROSCOPY Left 11/14/2016   Procedure: ARTHROSCOPY SHOULDER  WITH LIMITED DEDBRIDEMENT AND ACROMIOPLASTY AND DISTAL CLAVICAL EXCISION;  Surgeon: Vickki HearingHarrison, Stanley E, MD;  Location: [facility];  Service: Orthopedics;  Laterality: Left;  . TOTAL KNEE ARTHROPLASTY Left 09/14/2014   Procedure: LEFT TOTAL KNEE ARTHROPLASTY;  Surgeon: Vickki HearingStanley E Harrison, MD;  Location: [facility];  Service: Orthopedics;  Laterality: Left;     OB History   None      Home Medications    Prior to Admission medications   Medication Sig Start Date End Date Taking? Authorizing Provider  alendronate (FOSAMAX) 70 MG tablet Take 70 mg by mouth every 7 (seven) days. Takes on Sunday. Take with a full glass of water on an empty stomach.    [provider]  amoxicillin (AMOXIL) 500 MG capsule Take 1 capsule (500 mg total) by mouth 3 (three) times daily. 10/31/17   Elson AreasSofia, Kory Rains K, PA-C  aspirin EC 325 MG tablet Take by mouth. 06/16/17   [provider]  Black Cohosh 540 MG CAPS Take 1 capsule by mouth daily.    [provider]  cetirizine (ZYRTEC) 10 MG tablet Take 10 mg by mouth daily.    [provider]  citalopram (CELEXA) 20 MG tablet Take 20 mg by mouth daily. 10/11/15   [provider]  clopidogrel (PLAVIX) 300 MG TABS tablet Take 300 mg by mouth once.    [provider]  [employer] CALCIUM 600+D 600-800 MG-UNIT  TABS Take 1 tablet by mouth daily. 10/08/15   [provider]  diclofenac (VOLTAREN) 50 MG EC tablet Take 1 tablet (50 mg total) by mouth 2 (two) times daily. 10/31/17   Elson Areas, PA-C  gabapentin (NEURONTIN) 300 MG capsule Take 300 mg by mouth 3 (three) times daily.     [provider]  HYDROcodone-acetaminophen (NORCO/VICODIN) 5-325 MG tablet TK 1 TABLET PO TID 09/15/17   [provider]  metoprolol tartrate (LOPRESSOR) 25 MG tablet TK 1 T PO BID 04/07/17   [provider]  Multiple Vitamin (MULTIVITAMIN WITH MINERALS) TABS tablet Take 1 tablet by mouth daily.    [provider]    pantoprazole (PROTONIX) 40 MG tablet Take 40 mg by mouth Daily.  01/27/12   [provider]  potassium chloride SA (K-DUR,KLOR-CON) 20 MEQ tablet Take 2 tablets (40 mEq total) by mouth 3 (three) times daily. Patient not taking: Reported on 10/15/2017 11/13/16   Vickki Hearing, MD  SOYBEAN PO Take by mouth.    [provider]  topiramate (TOPAMAX) 100 MG tablet Take 100 mg by mouth at bedtime. 09/22/15   [provider]  topiramate (TOPAMAX) 25 MG tablet TK 1 T PO HS 10/01/17   [provider]    Family History Family History  Problem Relation Age of Onset  . Heart attack Mother   . Arthritis Mother   . Heart attack Father   . Cancer Father   . Diabetes Father     Social History Social History   Tobacco Use  . Smoking status: Former Smoker    Packs/day: 1.00    Years: 41.00    Pack years: 41.00  . Smokeless tobacco: Never Used  . Tobacco comment: quit when she had stroke   Substance Use Topics  . Alcohol use: No  . Drug use: No     Allergies   Bee venom   Review of Systems Review of Systems  All other systems reviewed and are negative.    Physical Exam Updated Vital Signs BP 113/76 (BP Location: Right Arm)   Pulse 60   Temp 97.6 F (36.4 C) (Oral)   Resp 14   Ht 5\' 2"  (1.575 m)   Wt 56.2 kg (124 lb)   SpO2 97%   BMI 22.68 kg/m   Physical Exam  Constitutional: She is oriented to person, place, and time. She appears well-developed and well-nourished.  HENT:  Head: Normocephalic.  Right Ear: External ear normal.  Left Ear: External ear normal.  Swelling gumline  Eyes: Pupils are equal, round, and reactive to light. EOM are normal.  Neck: Normal range of motion.  Pulmonary/Chest: Effort normal.  Abdominal: She exhibits no distension.  Musculoskeletal: Normal range of motion.  Neurological: She is alert and oriented to person, place, and time.  Psychiatric: She has a normal mood and affect.  Nursing note and vitals  reviewed.    ED Treatments / Results  Labs (all labs ordered are listed, but only abnormal results are displayed) Labs Reviewed - No data to display  EKG None  Radiology No results found.  Procedures Procedures (including critical care time)  Medications Ordered in ED Medications - No data to display   Initial Impression / Assessment and Plan / ED Course  I have reviewed the triage vital signs and the nursing notes.  Pertinent labs & imaging results that were available during my care of the patient were reviewed by me and considered in my  medical decision making (see chart for details).       Final Clinical Impressions(s) / ED Diagnoses   Final diagnoses:  Pain, dental    ED Discharge Orders        Ordered    amoxicillin (AMOXIL) 500 MG capsule  3 times daily     10/31/17 1712    diclofenac (VOLTAREN) 50 MG EC tablet  2 times daily     10/31/17 1712    An After Visit Summary was printed and given to the patient.    Elson Areas, PA-C 10/31/17 1757    Vanetta Mulders, MD 11/05/17 [phone]

## 2017-10-31 NOTE — ED Triage Notes (Signed)
Broken tooth upper left, filling has been out for a while, pain x 2 days.

## 2018-02-11 ENCOUNTER — Encounter (HOSPITAL_COMMUNITY): Payer: Self-pay

## 2018-10-19 DIAGNOSIS — Z8679 Personal history of other diseases of the circulatory system: Secondary | ICD-10-CM | POA: Insufficient documentation

## 2019-03-08 ENCOUNTER — Ambulatory Visit (INDEPENDENT_AMBULATORY_CARE_PROVIDER_SITE_OTHER): Payer: Medicare HMO | Admitting: Orthopedic Surgery

## 2019-03-08 ENCOUNTER — Encounter: Payer: Self-pay | Admitting: Orthopedic Surgery

## 2019-03-08 ENCOUNTER — Other Ambulatory Visit: Payer: Self-pay

## 2019-03-08 ENCOUNTER — Ambulatory Visit: Payer: Medicare HMO

## 2019-03-08 VITALS — BP 133/82 | HR 58 | Ht 62.0 in | Wt 124.0 lb

## 2019-03-08 DIAGNOSIS — M7052 Other bursitis of knee, left knee: Secondary | ICD-10-CM | POA: Diagnosis not present

## 2019-03-08 DIAGNOSIS — Z7409 Other reduced mobility: Secondary | ICD-10-CM | POA: Insufficient documentation

## 2019-03-08 DIAGNOSIS — Z96652 Presence of left artificial knee joint: Secondary | ICD-10-CM

## 2019-03-08 NOTE — Progress Notes (Signed)
Progress Note   Patient ID: Alyssa Poole, female   DOB: Sep 28, 1960, 58 y.o.   MRN: [id number]  Body mass index is 22.68 kg/m.  Chief Complaint  Patient presents with  . Knee Pain    left knee pain with ROM and can't sleep due to pain at night     Encounter Diagnoses  Name Primary?  . Status post total left knee replacement 09/14/14 Yes  . Pes anserinus bursitis of left knee     58 year old female status post left total knee comes in with medial knee pain and some anterior knee pain    Review of Systems  Constitutional:       History of chronic pain history of back pain last x-ray of her prosthesis was 2017      BP 133/82   Pulse (!) 58   Ht 5\' 2"  (1.575 m)   Wt 124 lb (56.2 kg)   BMI 22.68 kg/m   Physical Exam Musculoskeletal:     Comments: Left knee alignment looks good incision is clean no erythema no swelling of the joint  Below the joint and the pes bursa she is tender and swollen there range of motion remains intact her knee feels stable in all planes muscle tone is good  Neurovascular exam is intact      Medical decisions:  (Established problem worse, x-ray ,physical therapy, over-the-counter medicines, read outside film or summarize x-ray)  Data  Imaging:   Left total knee DePuy Sigma no signs of loosening alignment looks good  Encounter Diagnoses  Name Primary?  . Status post total left knee replacement 09/14/14 Yes  . Pes anserinus bursitis of left knee     PLAN:   Inject pes bursa left knee  Ice 3 times a day  Topical medication 3 times a day for 6 weeks  Procedure note Left knee injection for bursitis   verbal consent was obtained to inject Left knee PES BURSA  Timeout was completed to confirm the site of injection  The medications used were 40 mg of Depo-Medrol and 1% lidocaine 3 cc  Anesthesia was provided by ethyl chloride and the skin was prepped with alcohol.  After cleaning the skin with alcohol a 25-gauge needle was  used to inject the left knee bursa   There were no complications and a sterile bandage was applied    FU PRN   Arther Abbott, MD 03/08/2019 4:27 PM

## 2019-03-08 NOTE — Patient Instructions (Addendum)
You have bursitis of the knee  Recommend ice 3 times a day for the next 6 weeks and then get some icy hot in place on the area 3 times a day for the next 6 weeks  This is self-limiting does not require surgery and can be managed at home   Pes Anserine Bursitis  The pes anserine is an area on the inside of your knee, just below the joint, that is cushioned by a fluid-filled sac (bursa). Pes anserine bursitis is a condition that happens when the bursa gets swollen and irritated. The condition causes knee pain. What are the causes? This condition may be caused by:  Making the same movement over and over.  A direct hit (trauma) to the inside of the leg. What increases the risk? You are more likely to develop this condition if you:  Are a runner.  Play sports that involve a lot of running and quick side-to-side movements (cutting).  Are an athlete who plays contact sports.  Swim using an inward angle of the knee, such as with the breaststroke.  Have tight hamstring muscles.  Are a woman.  Are overweight.  Have flat feet.  Have diabetes or osteoarthritis. What are the signs or symptoms? Symptoms of this condition include:  Knee pain that gets better with rest and worse with activities like climbing stairs, walking, running, or getting in and out of a chair.  Swelling.  Warmth.  Tenderness when pressing at the inside of the lower leg, just below the knee. How is this diagnosed? This condition may be diagnosed based on:  Your symptoms.  Your medical history.  A physical exam. ? During your physical exam, your health care provider will press on the tendon attachment to see if you feel pain. ? Your health care provider may also check your hip and knee motion and strength.  Tests to check for swelling and fluid buildup in the bursa and to look at muscles, bones, and tendons. These tests might include: ? X-rays. ? MRI. ? Ultrasound. How is this treated? This  condition may be treated by:  Resting your knee. You may be told to raise (elevate) your knee while resting.  Avoiding activities that cause pain.  Icing the inside of your knee.  Sleeping with a pillow between your knees. This will cushion your injured knee.  Taking medicine by mouth (orally) to reduce pain and swelling or having medicine injected into your knee.  Doing strengthening and stretching exercises (physical therapy). If these treatments do not work or if the condition keeps coming back, you may need to have surgery to remove the bursa. Follow these instructions at home: Managing pain, stiffness, and swelling   If directed, put ice on the injured area. ? Put ice in a plastic bag. ? Place a towel between your skin and the bag. ? Leave the ice on for 20 minutes, 2-3 times a day.  Elevate the injured area above the level of your heart while you are sitting or lying down. Activity  Return to your normal activities as told by your health care provider. Ask your health care provider what activities are safe for you.  Do exercises as told by your health care provider. General instructions  Take over-the-counter and prescription medicines only as told by your health care provider.  Sleep with a pillow between your knees.  Do not use any products that contain nicotine or tobacco, such as cigarettes, e-cigarettes, and chewing tobacco. These can delay healing. If  you need help quitting, ask your health care provider.  If you are overweight, work with your health care provider and a dietitian to set a weight-loss goal that is healthy and reasonable for you.  Keep all follow-up visits as told by your health care provider. This is important. How is this prevented?  When exercising, make sure that you: ? Warm up and stretch before being active. ? Cool down and stretch after being active. ? Give your body time to rest between periods of activity. ? Use equipment that fits  you. ? Are safe and responsible while being active to avoid falls. ? Do at least 150 minutes of moderate-intensity exercise each week, such as brisk walking or water aerobics. ? Maintain physical fitness, including:  Strength.  Flexibility.  Cardiovascular fitness.  Endurance. ? Maintain a healthy weight. Contact a health care provider if:  Your symptoms do not improve.  Your symptoms get worse. Summary  Pes anserine bursitis is a condition that happens when the fluid-filled sac (bursa) at the inside of your knee gets swollen and irritated. The condition causes knee pain.  Treatment for pes anserine bursitis may include resting your knee, icing the inside of your knee, sleeping with a pillow between your knees, taking medicine by mouth or by injection, and doing strengthening and stretching exercises (physical therapy).  Follow instructions for managing pain, stiffness, and swelling.  Take over-the-counter and prescription medicines only as told by your health care provider. This information is not intended to replace advice given to you by your health care provider. Make sure you discuss any questions you have with your health care provider. Document Released: 03/18/2005 Document Revised: 07/09/2018 Document Reviewed: 08/27/2017 Elsevier Patient Education  2020 [employer].

## 2019-07-06 ENCOUNTER — Other Ambulatory Visit: Payer: Self-pay

## 2019-07-06 ENCOUNTER — Ambulatory Visit: Payer: Medicare HMO

## 2019-07-06 ENCOUNTER — Ambulatory Visit (INDEPENDENT_AMBULATORY_CARE_PROVIDER_SITE_OTHER): Payer: Medicare HMO | Admitting: Orthopaedic Surgery

## 2019-07-06 ENCOUNTER — Encounter: Payer: Self-pay | Admitting: Orthopaedic Surgery

## 2019-07-06 VITALS — BP 133/73 | HR 54 | Ht 62.0 in | Wt 128.0 lb

## 2019-07-06 DIAGNOSIS — M25511 Pain in right shoulder: Secondary | ICD-10-CM

## 2019-07-06 DIAGNOSIS — Y92019 Unspecified place in single-family (private) house as the place of occurrence of the external cause: Secondary | ICD-10-CM | POA: Diagnosis not present

## 2019-07-06 DIAGNOSIS — W19XXXA Unspecified fall, initial encounter: Secondary | ICD-10-CM | POA: Diagnosis not present

## 2019-07-06 MED ORDER — HYDROCODONE-ACETAMINOPHEN 5-325 MG PO TABS: ORAL_TABLET | ORAL | 0 refills | Status: DC

## 2019-07-06 NOTE — Progress Notes (Signed)
Patient Alyssa Poole, female DOB:1960/11/02, 59 y.o. DXI:[id number]  Chief Complaint  Patient presents with  . Shoulder Injury    fall 07/03/19 with right shoulder pain     HPI  Alyssa Poole is a 60 y.o. female who fell at home on 07-03-2019 and hurt her right shoulder.  She has pain with overhead use and laying on it at night. She has no redness, no swelling and no numbness.  It hurts.   Body mass index is 23.41 kg/m.  ROS  Review of Systems  Constitutional: Positive for activity change.  Musculoskeletal: Positive for arthralgias.  All other systems reviewed and are negative.   All other systems reviewed and are negative.  The following is a summary of the past history medically, past history surgically, known current medicines, social history and family history.  This information is gathered electronically by the computer from prior information and documentation.  I review this each visit and have found including this information at this point in the chart is beneficial and informative.    Past Medical History:  Diagnosis Date  . Anxiety   . Arthritis   . Asthma   . Chronic headaches   . Chronic neck pain   . Depression 2002  . Dysconjugate gaze   . GERD (gastroesophageal reflux disease)   . Hypertension   . Neuromuscular disorder ([place])   . Osteoporosis   . Panic attacks 2002    Past Surgical History:  Procedure Laterality Date  . BACK SURGERY     x4  . CERVICAL SPINE SURGERY     plate in neck  . CHOLECYSTECTOMY    . KNEE ARTHROSCOPY WITH MEDIAL MENISECTOMY Left 03/09/2014   Procedure: LEFT KNEE ARTHROSCOPY WITH PARTIAL MEDIAL MENISECTOMY;  Surgeon: Carole Civil, MD;  Location: [facility];  Service: Orthopedics;  Laterality: Left;  . SHOULDER ARTHROSCOPY Left 11/14/2016   Procedure: ARTHROSCOPY SHOULDER WITH LIMITED DEDBRIDEMENT AND ACROMIOPLASTY AND DISTAL CLAVICAL EXCISION;  Surgeon: Carole Civil, MD;  Location: [facility];  Service: Orthopedics;   Laterality: Left;  . TOTAL KNEE ARTHROPLASTY Left 09/14/2014   Procedure: LEFT TOTAL KNEE ARTHROPLASTY;  Surgeon: Carole Civil, MD;  Location: [facility];  Service: Orthopedics;  Laterality: Left;    Family History  Problem Relation Age of Onset  . Heart attack Mother   . Arthritis Mother   . Heart attack Father   . Cancer Father   . Diabetes Father     Social History Social History   Tobacco Use  . Smoking status: Former Smoker    Packs/day: 1.00    Years: 41.00    Pack years: 41.00  . Smokeless tobacco: Never Used  . Tobacco comment: quit when she had stroke   Substance Use Topics  . Alcohol use: No  . Drug use: No    Allergies  Allergen Reactions  . Bee Venom Swelling    Causes bad swelling. No epipen  needed    Current Outpatient Medications  Medication Sig Dispense Refill  . alendronate (FOSAMAX) 70 MG tablet Take 70 mg by mouth every 7 (seven) days. Takes on Sunday. Take with a full glass of water on an empty stomach.    Marland Kitchen aspirin EC 325 MG tablet Take by mouth.    . Black Cohosh 540 MG CAPS Take 1 capsule by mouth daily.    . cetirizine (ZYRTEC) 10 MG tablet Take 10 mg by mouth daily.    . citalopram (CELEXA) 20 MG tablet Take  20 mg by mouth daily.  6  . clopidogrel (PLAVIX) 300 MG TABS tablet Take 300 mg by mouth once.    . [employer]S CALCIUM 600+D 600-800 MG-UNIT TABS Take 1 tablet by mouth daily.  11  . gabapentin (NEURONTIN) 300 MG capsule Take 300 mg by mouth 3 (three) times daily.     . metoprolol tartrate (LOPRESSOR) 25 MG tablet TK 1 T PO BID  5  . Multiple Vitamin (MULTIVITAMIN WITH MINERALS) TABS tablet Take 1 tablet by mouth daily.    . pantoprazole (PROTONIX) 40 MG tablet Take 40 mg by mouth Daily.     . potassium chloride SA (K-DUR,KLOR-CON) 20 MEQ tablet Take 2 tablets (40 mEq total) by mouth 3 (three) times daily. 6 tablet 0  . SOYBEAN PO Take by mouth.    . topiramate (TOPAMAX) 25 MG tablet TK 1 T PO HS  5  . HYDROcodone-acetaminophen  (NORCO/VICODIN) 5-325 MG tablet One tablet every four hours as needed for acute pain.  Limit of five days per  statue. 30 tablet 0   No current facility-administered medications for this visit.     Physical Exam  Blood pressure 133/73, pulse (!) 54, height 5\' 2"  (1.575 m), weight 128 lb (58.1 kg).  Constitutional: overall normal hygiene, normal nutrition, well developed, normal grooming, normal body habitus. Assistive device:none  Musculoskeletal: gait and station Limp none, muscle tone and strength are normal, no tremors or atrophy is present.  .  Neurological: coordination overall normal.  Deep tendon reflex/nerve stretch intact.  Sensation normal.  Cranial nerves II-XII intact.   Skin:   Normal overall no scars, lesions, ulcers or rashes. No psoriasis.  Psychiatric: Alert and oriented x 3.  Recent memory intact, remote memory unclear.  Normal mood and affect. Well groomed.  Good eye contact.  Cardiovascular: overall no swelling, no varicosities, no edema bilaterally, normal temperatures of the legs and arms, no clubbing, cyanosis and good capillary refill.  Lymphatic: palpation is normal.  Right shoulder has painful but full ROM of the right shoulder.  NV intact.  Neck has full ROM.  Grips normal.  NV intact. All other systems reviewed and are negative   The patient has been educated about the nature of the problem(s) and counseled on treatment options.  The patient appeared to understand what I have discussed and is in agreement with it.  Encounter Diagnosis  Name Primary?  . Acute pain of right shoulder Yes   X-rays were done of the right shoulder, reported separately.  PLAN Call if any problems.  Precautions discussed.  Continue current medications.   Return to clinic 2 weeks   I have reviewed the [facility] Controlled Substance Reporting System web site prior to prescribing narcotic medicine for this patient.   Sling and exercises  given.  Electronically Signed [facility], MD 4/6/20218:42 AM

## 2019-07-20 ENCOUNTER — Ambulatory Visit: Payer: Medicare HMO | Admitting: Orthopaedic Surgery

## 2019-08-31 ENCOUNTER — Ambulatory Visit: Payer: Medicare HMO

## 2019-08-31 ENCOUNTER — Encounter: Payer: Self-pay | Admitting: Orthopedic Surgery

## 2019-08-31 ENCOUNTER — Other Ambulatory Visit: Payer: Self-pay

## 2019-08-31 ENCOUNTER — Ambulatory Visit (INDEPENDENT_AMBULATORY_CARE_PROVIDER_SITE_OTHER): Payer: Medicare HMO | Admitting: Orthopedic Surgery

## 2019-08-31 VITALS — BP 136/68 | HR 57 | Ht 62.0 in | Wt 125.0 lb

## 2019-08-31 DIAGNOSIS — M25561 Pain in right knee: Secondary | ICD-10-CM | POA: Diagnosis not present

## 2019-08-31 DIAGNOSIS — G8929 Other chronic pain: Secondary | ICD-10-CM | POA: Diagnosis not present

## 2019-08-31 NOTE — Progress Notes (Signed)
Chief Complaint  Patient presents with  . Knee Pain    right knee fall x 3 months ago painful since     59 year old female fell on her right knee 3 months ago.  She was walking tripped over a speed bump and landed on a flexed knee.  About a month later started having pain behind her kneecap and difficulty going up and down the stairs.  The knee feels like it catches and occasionally locks  Review of systems history of atrial fibrillation seems to be brought on by being excited  Otherwise no shortness of breath chest pain does have some left shoulder pain which is chronic  Past Medical History:  Diagnosis Date  . Anxiety   . Arthritis   . Asthma   . Chronic headaches   . Chronic neck pain   . Depression 2002  . Dysconjugate gaze   . GERD (gastroesophageal reflux disease)   . Hypertension   . Neuromuscular disorder (HCC)   . Osteoporosis   . Panic attacks 2002   Family History  Problem Relation Age of Onset  . Heart attack Mother   . Arthritis Mother   . Heart attack Father   . Cancer Father   . Diabetes Father     Social History   Tobacco Use  . Smoking status: Former Smoker    Packs/day: 1.00    Years: 41.00    Pack years: 41.00  . Smokeless tobacco: Never Used  . Tobacco comment: quit when she had stroke   Substance Use Topics  . Alcohol use: No  . Drug use: No   BP 136/68   Pulse (!) 57   Ht 5\' 2"  (1.575 m)   Wt 125 lb (56.7 kg)   BMI 22.86 kg/m   She is awake alert and oriented x3  Mood and affect normal  Gait and station no limp stride length is short knee flexion throughout the weightbearing cycle is decreased  Exam right knee no significant crepitance she does have pain with patellar compression no joint line tenderness no effusion full range of motion pain with extension muscle strength and tone are normal  Sensation is normal  Images x-ray shows mild arthritis medial compartment with normal alignment  Most likely had a retropatellar  contusion  Recommend injection  Patient will see me for her left shoulder and that time I will check her right knee  Procedure note right knee injection   verbal consent was obtained to inject right knee joint  Timeout was completed to confirm the site of injection  The medications used were 40 mg of Depo-Medrol and 1% lidocaine 3 cc  Anesthesia was provided by ethyl chloride and the skin was prepped with alcohol.  After cleaning the skin with alcohol a 20-gauge needle was used to inject the right knee joint. There were no complications. A sterile bandage was applied.   Encounter Diagnosis  Name Primary?  . Chronic pain of right knee Yes

## 2019-09-14 ENCOUNTER — Ambulatory Visit: Payer: Medicare HMO

## 2019-09-14 ENCOUNTER — Other Ambulatory Visit: Payer: Self-pay

## 2019-09-14 ENCOUNTER — Ambulatory Visit (INDEPENDENT_AMBULATORY_CARE_PROVIDER_SITE_OTHER): Payer: Medicare HMO | Admitting: Orthopedic Surgery

## 2019-09-14 ENCOUNTER — Encounter: Payer: Self-pay | Admitting: Orthopedic Surgery

## 2019-09-14 VITALS — BP 120/59 | HR 52 | Ht 62.0 in | Wt 124.0 lb

## 2019-09-14 DIAGNOSIS — M25512 Pain in left shoulder: Secondary | ICD-10-CM

## 2019-09-14 DIAGNOSIS — G8929 Other chronic pain: Secondary | ICD-10-CM | POA: Diagnosis not present

## 2019-09-14 NOTE — Progress Notes (Signed)
Chief Complaint  Patient presents with  . Shoulder Pain    left     59 year old female had surgery on her left shoulder distal clavicle excision a debridement of the glenohumeral joint  Comes in with left shoulder pain recurrent decreased range of motion trouble laying on her left side decreased lifting on the left side with the left arm  Review of systems negative for any numbness or tingling fever chills or rash  Past Medical History:  Diagnosis Date  . Anxiety   . Arthritis   . Asthma   . Chronic headaches   . Chronic neck pain   . Depression 2002  . Dysconjugate gaze   . GERD (gastroesophageal reflux disease)   . Hypertension   . Neuromuscular disorder (HCC)   . Osteoporosis   . Panic attacks 2002    BP (!) 120/59   Pulse (!) 52   Ht 5\' 2"  (1.575 m)   Wt 124 lb (56.2 kg)   BMI 22.68 kg/m   Normal development grooming and hygiene awake alert and oriented x3 mood and affect normal  Left shoulder tenderness around the glenoid humerus proximally posterior subacromial space mild tenderness AC joint  Active and passive range of motion: Decreased active range of motion normal painful passive range of motion decreased flexion normal external rotation arm at the side decreased internal rotation  Positive impingement sign  Mild pain reaching across the chest  No weakness in the rotator cuff negative drop test  X-rays show mild proximal migration of the humerus mild inferior osteophyte with normal glenohumeral joint space  Recommend injection home exercises  Follow-up 4 weeks if no improvement recommend MRI shoulder  Procedure note the subacromial injection shoulder left   Verbal consent was obtained to inject the  Left   Shoulder  Timeout was completed to confirm the injection site is a subacromial space of the  left  shoulder  Medication used Depo-Medrol 40 mg and lidocaine 1% 3 cc  Anesthesia was provided by ethyl chloride  The injection was performed in the  left  posterior subacromial space. After pinning the skin with alcohol and anesthetized the skin with ethyl chloride the subacromial space was injected using a 20-gauge needle. There were no complications  Sterile dressing was applied.         PRE-OPERATIVE DIAGNOSIS:  left rotator cuff tear   POST-OPERATIVE DIAGNOSIS:  Bursitis left shoulder, rotator cuff tendinitis tendinosis, acromioclavicular joint arthrosis, glenohumeral joint arthrosis, glenohumeral synovitis   PROCEDURE:  Procedure(s): SHOULDER ARTHROSCOPY  DISTAL CLAVICLE excision with acromioplasty subacromial bursectomy and glenohumeral joint limited debridement  (Left)   Operative findings the glenohumeral joint was arthritic there was degenerative fraying of the anterior labrum with synovitis and degenerative fraying of the subscapularis is a small undersurface rotator cuff abrasion without detachment   The subacromial bursa was inflamed and thickened, the acromion had a medial spur in the distal clavicle had an inferior spur   The superior surface of the rotator cuff had evidence of tendinosis without tear

## 2019-10-14 ENCOUNTER — Other Ambulatory Visit: Payer: Self-pay

## 2019-10-14 ENCOUNTER — Encounter: Payer: Self-pay | Admitting: Orthopedic Surgery

## 2019-10-14 ENCOUNTER — Ambulatory Visit (INDEPENDENT_AMBULATORY_CARE_PROVIDER_SITE_OTHER): Payer: Medicare HMO | Admitting: Orthopedic Surgery

## 2019-10-14 VITALS — BP 120/70 | HR 52 | Ht 62.0 in | Wt 122.0 lb

## 2019-10-14 DIAGNOSIS — G8929 Other chronic pain: Secondary | ICD-10-CM

## 2019-10-14 DIAGNOSIS — M25512 Pain in left shoulder: Secondary | ICD-10-CM

## 2019-10-14 NOTE — Progress Notes (Signed)
Chief Complaint  Patient presents with  . Shoulder Pain    Lt shoulder shot only helped for 41 days    59-year-old female with recurrent shoulder pain status post shoulder arthroscopy with limited debridement acromioplasty and distal clavicle excision back in 2018 presented a month ago with recurrent pain in left shoulder with decreased range of motion and painful forward elevation which is responded to subacromial injection home exercises and oral NSAID therapy  MRI back in 2018 prior to surgery IMPRESSION: 1. 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 2. Several subacromial ganglion cysts are noted adjacent to the infraspinatus tendon. 3. Moderate AC joint osteoarthritis with slight impingement on the myotendinous junction of the supraspinatus tendon. 4. 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   Electronically Signed   By: Tollie Eth M.D.   On: 09/30/2016 23:26  Past Surgical History:  Procedure Laterality Date  . BACK SURGERY     x4  . CERVICAL SPINE SURGERY     plate in neck  . CHOLECYSTECTOMY    . KNEE ARTHROSCOPY WITH MEDIAL MENISECTOMY Left 03/09/2014   Procedure: LEFT KNEE ARTHROSCOPY WITH PARTIAL MEDIAL MENISECTOMY;  Surgeon: Vickki Hearing, MD;  Location: [facility];  Service: Orthopedics;  Laterality: Left;  . SHOULDER ARTHROSCOPY Left 11/14/2016   Procedure: ARTHROSCOPY SHOULDER WITH LIMITED DEDBRIDEMENT AND ACROMIOPLASTY AND DISTAL CLAVICAL EXCISION;  Surgeon: Vickki Hearing, MD;  Location: [facility];  Service: Orthopedics;  Laterality: Left;  . TOTAL KNEE ARTHROPLASTY Left 09/14/2014   Procedure: LEFT TOTAL KNEE ARTHROPLASTY;  Surgeon: Vickki Hearing, MD;  Location: [facility];  Service: Orthopedics;  Laterality: Left;    BP 120/70   Pulse (!) 52   Ht 5\' 2"  (1.575 m)   Wt 122 lb (55.3 kg)   BMI 22.31 kg/m   Based on the physical findings of painful forward  elevation weakness in abduction and flexion  Recommend MRI left shoulder.  (Chronic left shoulder pain, MRI)

## 2019-11-08 ENCOUNTER — Other Ambulatory Visit: Payer: Self-pay

## 2019-11-08 ENCOUNTER — Ambulatory Visit (HOSPITAL_COMMUNITY)
Admission: RE | Admit: 2019-11-08 | Discharge: 2019-11-08 | Disposition: A | Discharge status: Home / Self Care | Payer: Medicare HMO | Source: Ambulatory Visit | Attending: Orthopedic Surgery | Admitting: Orthopedic Surgery

## 2019-11-08 DIAGNOSIS — G8929 Other chronic pain: Secondary | ICD-10-CM | POA: Diagnosis present

## 2019-11-08 DIAGNOSIS — M25512 Pain in left shoulder: Secondary | ICD-10-CM | POA: Insufficient documentation

## 2019-11-10 ENCOUNTER — Other Ambulatory Visit: Payer: Self-pay

## 2019-11-10 ENCOUNTER — Ambulatory Visit (INDEPENDENT_AMBULATORY_CARE_PROVIDER_SITE_OTHER): Payer: Medicare HMO | Admitting: Orthopedic Surgery

## 2019-11-10 ENCOUNTER — Encounter: Payer: Self-pay | Admitting: Orthopedic Surgery

## 2019-11-10 VITALS — BP 108/53 | HR 57 | Ht 62.0 in | Wt 122.0 lb

## 2019-11-10 DIAGNOSIS — M19012 Primary osteoarthritis, left shoulder: Secondary | ICD-10-CM

## 2019-11-10 NOTE — Patient Instructions (Addendum)
Dr August Saucer is at our [facility] office Call Amy if you want to see Dr August Saucer for the surgical consult [phone]  Below is the instructions, and directions to his office.   Driving Directions to [employer] from [employer] address is [address] [place] The phone number is [phone]   1. Start out going [place] on [address] toward [address].  Then 0.02 miles0.02 total miles 2. Take the 1st right onto [address]/[place]. Continue to follow [address].  If you reach [facility] you've gone a little too far  Then 0.58 miles0.60 total miles 3. Turn right onto [address].  [place] is just past [address]  Then 2.25 miles2.85 total miles 4. Take the [address] ramp toward [place].  Then 0.25 miles3.10 total miles 5. Merge onto [address]  Then 18.17 miles21.28 total miles 6. Merge onto [address]  Then 1.47 miles22.74 total miles 7. Turn right onto [place].  [address] is just past [address]  Then 0.11 miles22.85 total miles    8. [address], [place], [place] [address], [phone] [address] is on the left.  Shoulder Joint Replacement Shoulder joint replacement is surgery to replace damaged parts of the shoulder joint with artificial parts (prostheses). Two parts may be used to replace this joint:  The humeral component replaces the head of the upper arm bone (humerus). This is a rounded ball that is attached to a stem that fits into the humerus.  The glenoid component replaces the socket (glenoid depression). The prostheses are usually made of metal and plastic. Depending on the damage to your shoulder, the surgeon may replace just the humeral head (hemiarthroplasty) or replace both the humeral head and the glenoid (total shoulder replacement). The surrounding muscles and tendons hold the prosthetic parts in place. This procedure may be done to relieve joint pain or to treat  severe shoulder fractures or arthritis. This surgery may be done if other non-surgical treatments have not worked. Tell a health care provider about:  Any allergies you have.  All medicines you are taking, including vitamins, herbs, eye drops, creams, and over-the-counter medicines.  Any problems you or family members have had with anesthetic medicines.  Any blood disorders you have.  Any surgeries you have had.  Any medical conditions you have.  Whether you are pregnant or may be pregnant. What are the risks? Generally, this is a safe procedure. However, problems may occur, including:  Infection.  Bleeding.  Allergic reactions to medicines.  Damage to other structures, organs, or nerves.  Fracture of the upper arm bone during or after surgery.  Instability of the shoulder after surgery.  Loosening of the glenoid component over time.  Unusual bone growth.  Failure of bone healing after surgery. What happens before the procedure? Medicines  Ask your health care provider about: ? Changing or stopping your regular medicines. This is especially important if you are taking diabetes medicines or blood thinners. ? Taking medicines such as aspirin and ibuprofen. These medicines can thin your blood. Do not take these medicines before your procedure if your health care provider instructs you not to.  You may be given antibiotic medicine to help prevent infection. Staying hydrated Follow instructions from your health care provider about hydration, which may include:  Up to 2 hours before the procedure - you may continue to drink clear liquids, such as water,  clear fruit juice, black coffee, and plain tea. Eating and drinking restrictions Follow instructions from your health care provider about eating and drinking, which may include:  8 hours before the procedure - stop eating heavy meals or foods such as meat, fried foods, or fatty foods.  6 hours before the procedure - stop  eating light meals or foods, such as toast or cereal.  6 hours before the procedure - stop drinking milk or drinks that contain milk.  2 hours before the procedure - stop drinking clear liquids. General instructions  Plan to have someone take you home from the hospital or clinic.  Plan to have someone with you for 24 hours after the procedure. It is also recommended that you have someone to assist you at home for the first few weeks after the procedure.  Do not use any products that contain nicotine or tobacco, such as cigarettes and e-cigarettes. If you need help quitting, ask your healthcare provider.  Ask your health care provider how your surgical site will be marked or identified. What happens during the procedure?  To reduce your risk of infection: ? Your health care team will wash or sanitize their hands. ? Your skin will be washed with soap. ? Hair may be removed from the surgical area.  An IV tube will be inserted into one of your veins.  You will be given one or more of the following: ? A medicine to help you relax (sedative). ? A medicine to make you fall asleep (general anesthetic). ? A medicine that is injected into your shoulder area to numb everything around the injection site (regional anesthetic).  An incision will be made on the front of the shoulder from the collarbone (clavicle) to the point where the shoulder muscle (deltoid) attaches to the upper arm bone.  The upper arm bone will be removed from the socket to expose the ball-like end of the upper arm.  The center cavity of the humerus bone will be cleaned and enlarged to create a hollow area that matches the shape of the implant stem. The top end of the bone will be smoothed so the stem will be level with the bone surface when it is inserted.  If the ball of the prosthesis is a separate piece, the proper size will be selected and attached.  If the socket portion of the joint is healthy and the surrounding  muscles are in good condition, the surgeon may decide not to replace it. However, if the socket needs to be replaced: ? The surgeon will prepare the socket surface by removing the remaining damaged cartilage. ? The socket bone will be gently reshaped to fit the implant. ? The glenoid component will be implanted and cemented into position.  The arm bone, with its new artificial head, will be replaced in the socket. The surgeon will reattach the supporting tendons and close the incision with sutures or stitches.  A bandage (dressing) will be placed over your incision.  Your arm will be placed in a sling or immobilizer, and a support pillow will be placed under your elbow.  Tubes will be placed to remove excess drainage. These are usually removed after a couple of days. The procedure may vary among health care providers and hospitals. What happens after the procedure?  Your blood pressure, heart rate, breathing rate, and blood oxygen level will be monitored until the medicines you were given have worn off.  Your arm will be numb if you were given a  regional anesthetic. This may last until the next day.  You will be given pain medicine as needed.  Your arm and shoulder will be stiff and bruised. This will improve over time.  An icing device will be placed around your shoulder. This helps to control pain and swelling.  Your arm will be in a sling or immobilizer. You will need to wear this for 2-4 weeks after surgery or as told by your health care provider.  Your health care team may begin to show you exercises for your shoulder.  Do not use your arm to push yourself up in bed or from a chair.  Do not lift anything that is heavier than a cup of coffee.  Do not drive for 24 hours if you were given a sedative. Ask your health care provider when it is safe for you to drive. Summary  Shoulder joint replacement is surgery to replace damaged parts of the shoulder joint with artificial parts  (prostheses).  The surgeon may replace just the humeral head (hemiarthroplasty) or replace both the humeral head and the glenoid (total shoulder replacement), based on the damage to your shoulder.  Taking medicine, icing the painful area, and doing exercises as told by your health care provider will help control your shoulder pain, swelling, and stiffness after surgery.  After the procedure, do not lift anything that is heavier than a cup of coffee and do not use your arm to push yourself up in bed or from a chair. This information is not intended to replace advice given to you by your health care provider. Make sure you discuss any questions you have with your health care provider. Document Revised: 02/28/2017 Document Reviewed: 01/01/2016 Elsevier Patient Education  2020 [employer].

## 2019-11-10 NOTE — Progress Notes (Signed)
Chief Complaint  Patient presents with  . Shoulder Pain    left   . Results    MRI review     59 year old female with chronic left shoulder pain she has had a couple of surgeries on the left side I believe if I remember correctly.  She was continued to have shoulder pain despite brief period of nonoperative treatment and was sent for MRI.  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  One is to get an intra-articular injection of the shoulder versus shoulder replacement versus live with the pain that she has tried to do strengthening exercises to help control it  She does have intermittent atrial fibrillation she had a stroke in 2018 but recovered  She will think about this before referral is being made as I told her there is no since getting a referral if she does not want to have surgery  Encounter Diagnosis  Name Primary?  Marland Kitchen Arthritis of left glenohumeral joint Yes    Chronic problem with progression, independent MRI interpretation

## 2020-06-19 ENCOUNTER — Other Ambulatory Visit (HOSPITAL_COMMUNITY): Payer: Medicare HMO

## 2020-06-19 ENCOUNTER — Emergency Department (HOSPITAL_COMMUNITY): Payer: Medicare HMO

## 2020-06-19 ENCOUNTER — Other Ambulatory Visit: Payer: Self-pay

## 2020-06-19 ENCOUNTER — Encounter (HOSPITAL_COMMUNITY): Payer: Self-pay

## 2020-06-19 ENCOUNTER — Observation Stay (HOSPITAL_COMMUNITY)
Admission: EM | Admit: 2020-06-19 | Discharge: 2020-06-20 | Disposition: A | Discharge status: Home / Self Care | Payer: Medicare HMO | Attending: Internal Medicine | Admitting: Internal Medicine

## 2020-06-19 ENCOUNTER — Observation Stay (HOSPITAL_COMMUNITY): Payer: Medicare HMO

## 2020-06-19 DIAGNOSIS — Z20822 Contact with and (suspected) exposure to covid-19: Secondary | ICD-10-CM | POA: Insufficient documentation

## 2020-06-19 DIAGNOSIS — I10 Essential (primary) hypertension: Secondary | ICD-10-CM | POA: Insufficient documentation

## 2020-06-19 DIAGNOSIS — I471 Supraventricular tachycardia: Principal | ICD-10-CM | POA: Insufficient documentation

## 2020-06-19 DIAGNOSIS — R079 Chest pain, unspecified: Secondary | ICD-10-CM | POA: Diagnosis present

## 2020-06-19 DIAGNOSIS — Z87891 Personal history of nicotine dependence: Secondary | ICD-10-CM | POA: Diagnosis not present

## 2020-06-19 DIAGNOSIS — Z96652 Presence of left artificial knee joint: Secondary | ICD-10-CM | POA: Diagnosis not present

## 2020-06-19 DIAGNOSIS — Z7982 Long term (current) use of aspirin: Secondary | ICD-10-CM | POA: Insufficient documentation

## 2020-06-19 DIAGNOSIS — Z79899 Other long term (current) drug therapy: Secondary | ICD-10-CM | POA: Diagnosis not present

## 2020-06-19 DIAGNOSIS — J45909 Unspecified asthma, uncomplicated: Secondary | ICD-10-CM | POA: Insufficient documentation

## 2020-06-19 DIAGNOSIS — E876 Hypokalemia: Secondary | ICD-10-CM

## 2020-06-19 DIAGNOSIS — I4891 Unspecified atrial fibrillation: Secondary | ICD-10-CM

## 2020-06-19 HISTORY — DX: Unspecified atrial fibrillation: I48.91

## 2020-06-19 LAB — ECHOCARDIOGRAM COMPLETE
AR max vel: 1.93 cm2
AV Area VTI: 1.84 cm2
AV Area mean vel: 1.84 cm2
AV Mean grad: 2.7 mmHg
AV Peak grad: 5.8 mmHg
Ao pk vel: 1.21 m/s
S' Lateral: 3.2 cm

## 2020-06-19 LAB — CBC WITH DIFFERENTIAL/PLATELET
Abs Immature Granulocytes: 0.02 10*3/uL (ref 0.00–0.07)
Basophils Absolute: 0.1 10*3/uL (ref 0.0–0.1)
Basophils Relative: 1 %
Eosinophils Absolute: 0.2 10*3/uL (ref 0.0–0.5)
Eosinophils Relative: 3 %
HCT: 36.7 % (ref 36.0–46.0)
Hemoglobin: 12 g/dL (ref 12.0–15.0)
Immature Granulocytes: 0 %
Lymphocytes Relative: 25 %
Lymphs Abs: 1.9 10*3/uL (ref 0.7–4.0)
MCH: 32.1 pg (ref 26.0–34.0)
MCHC: 32.7 g/dL (ref 30.0–36.0)
MCV: 98.1 fL (ref 80.0–100.0)
Monocytes Absolute: 0.4 10*3/uL (ref 0.1–1.0)
Monocytes Relative: 6 %
Neutro Abs: 4.8 10*3/uL (ref 1.7–7.7)
Neutrophils Relative %: 65 %
Platelets: 378 10*3/uL (ref 150–400)
RBC: 3.74 MIL/uL — ABNORMAL LOW (ref 3.87–5.11)
RDW: 12.2 % (ref 11.5–15.5)
WBC: 7.4 10*3/uL (ref 4.0–10.5)
nRBC: 0 % (ref 0.0–0.2)

## 2020-06-19 LAB — TROPONIN I (HIGH SENSITIVITY)
Troponin I (High Sensitivity): 4 ng/L (ref <18)
Troponin I (High Sensitivity): 43 ng/L — ABNORMAL HIGH (ref <18)
Troponin I (High Sensitivity): 97 ng/L — ABNORMAL HIGH (ref <18)
Troponin I (High Sensitivity): 110 ng/L (ref <18)
Troponin I (High Sensitivity): 102 ng/L (ref <18)

## 2020-06-19 LAB — BASIC METABOLIC PANEL
Anion gap: 13 (ref 5–15)
BUN: 13 mg/dL (ref 8–23)
CO2: 20 mmol/L — ABNORMAL LOW (ref 22–32)
Calcium: 9.3 mg/dL (ref 8.9–10.3)
Chloride: 108 mmol/L (ref 98–111)
Creatinine, Ser: 1 mg/dL (ref 0.44–1.00)
GFR, Estimated: >60 mL/min (ref >60)
Glucose, Bld: 122 mg/dL — ABNORMAL HIGH (ref 70–99)
Potassium: 3.2 mmol/L — ABNORMAL LOW (ref 3.5–5.1)
Sodium: 141 mmol/L (ref 135–145)

## 2020-06-19 LAB — LIPID PANEL
Cholesterol: 166 mg/dL (ref 0–200)
HDL: 61 mg/dL (ref >40)
LDL Cholesterol: 89 mg/dL (ref 0–99)
Total CHOL/HDL Ratio: 2.7 RATIO
Triglycerides: 78 mg/dL (ref <150)
VLDL: 16 mg/dL (ref 0–40)

## 2020-06-19 LAB — TSH: TSH: 1.016 u[IU]/mL (ref 0.350–4.500)

## 2020-06-19 LAB — MAGNESIUM: Magnesium: 1.6 mg/dL — ABNORMAL LOW (ref 1.7–2.4)

## 2020-06-19 LAB — GLUCOSE, CAPILLARY: Glucose-Capillary: 101 mg/dL — ABNORMAL HIGH (ref 70–99)

## 2020-06-19 MED ORDER — METOPROLOL TARTRATE 25 MG PO TABS
12.5000 mg | ORAL_TABLET | Freq: Two times a day (BID) | ORAL | Status: DC
  Administered 2020-06-19: 12.5 mg via ORAL
  Filled 2020-06-19: qty 1

## 2020-06-19 MED ORDER — ACETAMINOPHEN 325 MG PO TABS
650.0000 mg | ORAL_TABLET | ORAL | Status: DC | PRN
  Filled 2020-06-19: qty 2

## 2020-06-19 MED ORDER — CITALOPRAM HYDROBROMIDE 20 MG PO TABS
20.0000 mg | ORAL_TABLET | Freq: Every day | ORAL | Status: DC
  Administered 2020-06-20: 20 mg via ORAL
  Filled 2020-06-19 (×2): qty 1

## 2020-06-19 MED ORDER — MAGNESIUM SULFATE 2 GM/50ML IV SOLN
2.0000 g | Freq: Once | INTRAVENOUS | Status: AC
  Administered 2020-06-19: 2 g via INTRAVENOUS
  Filled 2020-06-19: qty 50

## 2020-06-19 MED ORDER — PANTOPRAZOLE SODIUM 40 MG PO TBEC
40.0000 mg | DELAYED_RELEASE_TABLET | Freq: Two times a day (BID) | ORAL | Status: DC
  Administered 2020-06-19 – 2020-06-20 (×3): 40 mg via ORAL
  Filled 2020-06-19 (×3): qty 1

## 2020-06-19 MED ORDER — ONDANSETRON HCL 4 MG/2ML IJ SOLN: 4.0000 mg | Freq: Four times a day (QID) | INTRAMUSCULAR | Status: DC | PRN

## 2020-06-19 MED ORDER — POTASSIUM CHLORIDE 10 MEQ/100ML IV SOLN
10.0000 meq | Freq: Once | INTRAVENOUS | Status: AC
  Administered 2020-06-19: 10 meq via INTRAVENOUS
  Filled 2020-06-19: qty 100

## 2020-06-19 MED ORDER — HYDROCODONE-ACETAMINOPHEN 7.5-325 MG PO TABS
1.0000 | ORAL_TABLET | Freq: Four times a day (QID) | ORAL | Status: DC | PRN
Start: 2020-06-19 — End: 2020-06-20
  Administered 2020-06-19: 1 via ORAL
  Filled 2020-06-19: qty 1

## 2020-06-19 MED ORDER — SODIUM CHLORIDE 0.9 % IV BOLUS
1000.0000 mL | Freq: Once | INTRAVENOUS | Status: AC
  Administered 2020-06-19: 1000 mL via INTRAVENOUS

## 2020-06-19 MED ORDER — TOPIRAMATE 25 MG PO TABS
50.0000 mg | ORAL_TABLET | Freq: Every day | ORAL | Status: DC
  Administered 2020-06-19 – 2020-06-20 (×2): 50 mg via ORAL
  Filled 2020-06-19 (×2): qty 2

## 2020-06-19 MED ORDER — ENOXAPARIN SODIUM 40 MG/0.4ML ~~LOC~~ SOLN
40.0000 mg | SUBCUTANEOUS | Status: DC
  Administered 2020-06-19: 40 mg via SUBCUTANEOUS
  Filled 2020-06-19: qty 0.4

## 2020-06-19 MED ORDER — ASPIRIN EC 81 MG PO TBEC
81.0000 mg | DELAYED_RELEASE_TABLET | Freq: Every day | ORAL | Status: DC
  Administered 2020-06-20: 81 mg via ORAL
  Filled 2020-06-19 (×2): qty 1

## 2020-06-19 MED ORDER — POTASSIUM CHLORIDE CRYS ER 20 MEQ PO TBCR
40.0000 meq | EXTENDED_RELEASE_TABLET | Freq: Once | ORAL | Status: AC
  Administered 2020-06-19: 40 meq via ORAL
  Filled 2020-06-19: qty 2

## 2020-06-19 MED ORDER — FLECAINIDE ACETATE 50 MG PO TABS
50.0000 mg | ORAL_TABLET | Freq: Two times a day (BID) | ORAL | Status: DC
  Administered 2020-06-20: 50 mg via ORAL
  Filled 2020-06-19: qty 1

## 2020-06-19 MED ORDER — POTASSIUM CHLORIDE CRYS ER 20 MEQ PO TBCR
20.0000 meq | EXTENDED_RELEASE_TABLET | Freq: Every day | ORAL | Status: DC
  Administered 2020-06-20: 20 meq via ORAL
  Filled 2020-06-19: qty 1

## 2020-06-19 MED ORDER — METOPROLOL TARTRATE 25 MG PO TABS
25.0000 mg | ORAL_TABLET | Freq: Two times a day (BID) | ORAL | Status: DC
  Administered 2020-06-19 – 2020-06-20 (×2): 25 mg via ORAL
  Filled 2020-06-19 (×2): qty 1

## 2020-06-19 NOTE — ED Notes (Signed)
Dr. Goldston at bedside.  

## 2020-06-19 NOTE — ED Triage Notes (Signed)
Pt presents to ED via POV with mid chest pain and left arm pain, shortness of breath started at 1100. EKG sent and given to EDP.

## 2020-06-19 NOTE — ED Notes (Signed)
Pt HR noted to be 74 at this time. EKG sent.

## 2020-06-19 NOTE — Progress Notes (Signed)
*  PRELIMINARY RESULTS* Echocardiogram 2D Echocardiogram has been performed.  Stacey Drain 06/19/2020, 4:22 PM

## 2020-06-19 NOTE — ED Notes (Signed)
ECHO tech at bedside.

## 2020-06-19 NOTE — H&P (Signed)
History and Physical    Alyssa Poole ZHG:[id number] DOB: 25-Jan-1961 DOA: 06/19/2020  PCP: Oval Linsey, MD   Patient coming from: home  I have personally briefly reviewed patient's old medical records in [facility]  Chief Complaint: Chest pain  HPI: Alyssa Poole is a 60 y.o. female with medical history significant of anxiety/depression, chronic headaches, hypertension, paroxysmal atrial fibrillation (not chronically on anticoagulation) and GERD; who presented to the hospital secondary to chest pain.  Patient reports sudden cessation, localized in the left side of her chest, radiated to her left arm and with mild associated dyspnea while present.  Lasted approximately 30 to 40 minutes; spontaneously resolved.  After further questioning patient expressed also some palpitations.  Denies nausea, vomiting, shortness of breath, coughing, fever, chills, dysuria, abdominal pain, hematuria, melena, hematochezia, focal weaknesses or any other complaints.  ED Course: Patient found on arrival with wide QRS tachycardia and complaints of chest discomfort.  Abnormal rhythm self terminated and currently sinus.  Patient found with mild hypokalemia and hypomagnesemia.  Case discussed with cardiology service who expressed concerns for his low-grade ventricular tachycardia recommended admission for further evaluation and management.  Review of Systems: As per HPI otherwise all other systems reviewed and are negative.   Past Medical History:  Diagnosis Date  . Anxiety   . Arthritis   . Asthma   . Atrial fibrillation (HCC)   . Chronic headaches   . Chronic neck pain   . Depression 2002  . Dysconjugate gaze   . GERD (gastroesophageal reflux disease)   . Hypertension   . Neuromuscular disorder (HCC)   . Osteoporosis   . Panic attacks 2002    Past Surgical History:  Procedure Laterality Date  . BACK SURGERY     x4  . CERVICAL SPINE SURGERY     plate in neck  . CHOLECYSTECTOMY     . KNEE ARTHROSCOPY WITH MEDIAL MENISECTOMY Left 03/09/2014   Procedure: LEFT KNEE ARTHROSCOPY WITH PARTIAL MEDIAL MENISECTOMY;  Surgeon: Vickki Hearing, MD;  Location: [facility];  Service: Orthopedics;  Laterality: Left;  . SHOULDER ARTHROSCOPY Left 11/14/2016   Procedure: ARTHROSCOPY SHOULDER WITH LIMITED DEDBRIDEMENT AND ACROMIOPLASTY AND DISTAL CLAVICAL EXCISION;  Surgeon: Vickki Hearing, MD;  Location: [facility];  Service: Orthopedics;  Laterality: Left;  . TOTAL KNEE ARTHROPLASTY Left 09/14/2014   Procedure: LEFT TOTAL KNEE ARTHROPLASTY;  Surgeon: Vickki Hearing, MD;  Location: [facility];  Service: Orthopedics;  Laterality: Left;    Social History  reports that she has quit smoking. She has a 41.00 pack-year smoking history. She has never used smokeless tobacco. She reports that she does not drink alcohol and does not use drugs.  Allergies  Allergen Reactions  . Bee Venom Swelling    Causes bad swelling. No epipen  needed    Family History  Problem Relation Age of Onset  . Heart attack Mother   . Arthritis Mother   . Heart attack Father   . Cancer Father   . Diabetes Father     Prior to Admission medications   Medication Sig Start Date End Date Taking? Authorizing Provider  alendronate (FOSAMAX) 70 MG tablet Take 70 mg by mouth every 7 (seven) days. Takes on Sunday. Take with a full glass of water on an empty stomach.   Yes [provider]  aspirin EC 81 MG tablet Take 81 mg by mouth daily. Swallow whole.   Yes [provider]  Black Cohosh 540 MG CAPS Take  1 capsule by mouth daily.   Yes [provider]  citalopram (CELEXA) 20 MG tablet Take 20 mg by mouth daily. 10/11/15  Yes [provider]  [employer] CALCIUM 600+D 600-800 MG-UNIT TABS Take 1 tablet by mouth daily. 10/08/15  Yes [provider]  flecainide (TAMBOCOR) 50 MG tablet Take 50 mg by mouth 2 times daily at 12 noon and 4 pm. 06/09/20  Yes [provider]   HYDROcodone-acetaminophen (NORCO/VICODIN) 5-325 MG tablet One tablet every four hours as needed for acute pain.  Limit of five days per [place] statue. Patient taking differently: Take 1 tablet by mouth every 4 (four) hours as needed for moderate pain. Limit of five days per [place]. 07/06/19  Yes Darreld Mclean, MD  metoprolol tartrate (LOPRESSOR) 25 MG tablet Take 25 mg by mouth 2 (two) times daily. 04/07/17  Yes [provider]  naproxen (NAPROSYN) 500 MG tablet Take 500 mg by mouth daily as needed for mild pain. 09/05/19  Yes [provider]  pantoprazole (PROTONIX) 40 MG tablet Take 40 mg by mouth Daily.  01/27/12  Yes [provider]  topiramate (TOPAMAX) 50 MG tablet Take 50 mg by mouth daily.   Yes [provider]    Physical Exam: Vitals:   06/19/20 1300 06/19/20 1345 06/19/20 1647 06/19/20 1709  BP: 108/65 136/78 (!) 161/81   Pulse: 70 64 63   Resp: 15 15 19    Temp:   97.8 F (36.6 C)   TempSrc:   Oral   SpO2: 99% 100% 100%   Weight:    55.4 kg  Height:    5\' 2"  (1.575 m)    Constitutional: NAD, calm and comfortable; denying chest pain.  No shortness of breath or palpitations reported Vitals:   06/19/20 1300 06/19/20 1345 06/19/20 1647 06/19/20 1709  BP: 108/65 136/78 (!) 161/81   Pulse: 70 64 63   Resp: 15 15 19    Temp:   97.8 F (36.6 C)   TempSrc:   Oral   SpO2: 99% 100% 100%   Weight:    55.4 kg  Height:    5\' 2"  (1.575 m)   Eyes: PERRL, lids and conjunctivae normal, no icterus. ENMT: Mucous membranes are moist. Posterior pharynx clear of any exudate or lesions. Neck: normal, supple, no masses, no thyromegaly, no JVD. Respiratory: clear to auscultation bilaterally, no wheezing, no crackles. Normal respiratory effort. No accessory muscle use.  Cardiovascular: Rate controlled, no rubs, no gallops, denying chest pain at this time. Abdomen: no tenderness, no masses palpated. No hepatosplenomegaly. Bowel sounds positive.   Musculoskeletal: no clubbing / cyanosis.  Normal muscle tone. Skin: No petechiae. Neurologic: CN 2-12 grossly intact. Sensation intact, DTR normal. Strength 5/5 in all 4.  Psychiatric: Normal judgment and insight. Alert and oriented x 3. Normal mood.    Labs on Admission: I have personally reviewed following labs and imaging studies  CBC: Recent Labs  Lab 06/19/20 1218  WBC 7.4  NEUTROABS 4.8  HGB 12.0  HCT 36.7  MCV 98.1  PLT 378    Basic Metabolic Panel: Recent Labs  Lab 06/19/20 1218  NA 141  K 3.2*  CL 108  CO2 20*  GLUCOSE 122*  BUN 13  CREATININE 1.00  CALCIUM 9.3  MG 1.6*    GFR: Estimated Creatinine Clearance: 46.7 mL/min (by C-G formula based on SCr of 1 mg/dL).  Urine analysis:    Component Value Date/Time   COLORURINE YELLOW 01/29/2017 1925  APPEARANCEUR CLEAR 01/29/2017 1925   LABSPEC 1.015 01/29/2017 1925   PHURINE 7.0 01/29/2017 1925   GLUCOSEU NEGATIVE 01/29/2017 1925   HGBUR NEGATIVE 01/29/2017 1925   BILIRUBINUR NEGATIVE 01/29/2017 1925   KETONESUR NEGATIVE 01/29/2017 1925   PROTEINUR NEGATIVE 01/29/2017 1925   UROBILINOGEN 0.2 01/15/2011 1251   NITRITE NEGATIVE 01/29/2017 1925   LEUKOCYTESUR NEGATIVE 01/29/2017 1925    Radiological Exams on Admission: DG Chest Portable 1 View  Result Date: 06/19/2020 CLINICAL DATA:  Chest pain. EXAM: PORTABLE CHEST 1 VIEW COMPARISON:  January 29, 2017. FINDINGS: The heart size and mediastinal contours are within normal limits. Both lungs are clear. No pneumothorax or pleural effusion is noted. The visualized skeletal structures are unremarkable. IMPRESSION: No active disease. Electronically Signed   By: Lupita RaiderJames  Green Jr M.D.   On: 06/19/2020 12:58   ECHOCARDIOGRAM COMPLETE  Result Date: 06/19/2020    ECHOCARDIOGRAM REPORT   Patient Name:   Alyssa Poole Date of Exam: 06/19/2020 Medical Rec #:  [id number]         Height:       62.0 in Accession #:    [id number]        Weight:       122.0 lb Date of  Birth:  1960-12-10        BSA:          1.549 m Patient Age:    59 years          BP:           136/78 mmHg Patient Gender: F                 HR:           64 bpm. [facility] Procedure: 2D Echo, Cardiac Doppler and Color Doppler Indications:    Chest Pain R07.9  History:        Patient has no prior history of Echocardiogram examinations.                 Arrythmias:Atrial Fibrillation; Risk Factors:Hypertension. GERD.  Sonographer:    Celesta GentileBernard White RCS Referring Phys: [id number] Dorothe PeaJONATHAN F BRANCH IMPRESSIONS  1. Left ventricular ejection fraction, by estimation, is 55 to 60%. The left ventricle has normal function. The left ventricle has no regional wall motion abnormalities. Left ventricular diastolic parameters were normal.  2. Right ventricular systolic function is normal. The right ventricular size is normal. There is normal pulmonary artery systolic pressure.  3. Left atrial size was moderately dilated.  4. The mitral valve is normal in structure. Trivial mitral valve regurgitation. No evidence of mitral stenosis.  5. The aortic valve is tricuspid. Aortic valve regurgitation is not visualized. No aortic stenosis is present.  6. The inferior vena cava is normal in size with greater than 50% respiratory variability, suggesting right atrial pressure of 3 mmHg. FINDINGS  Left Ventricle: Left ventricular ejection fraction, by estimation, is 55 to 60%. The left ventricle has normal function. The left ventricle has no regional wall motion abnormalities. The left ventricular internal cavity size was normal in size. There is  no left ventricular hypertrophy. Left ventricular diastolic parameters were normal. Right Ventricle: The right ventricular size is normal. No increase in right ventricular wall thickness. Right ventricular systolic function is normal. There is normal pulmonary artery systolic pressure. The tricuspid regurgitant velocity is 2.22 m/s, and  with an assumed right atrial pressure of 8 mmHg,  the estimated right ventricular systolic pressure is 27.7 mmHg.  Left Atrium: Left atrial size was moderately dilated. Right Atrium: Right atrial size was normal in size. Pericardium: There is no evidence of pericardial effusion. Mitral Valve: The mitral valve is normal in structure. Trivial mitral valve regurgitation. No evidence of mitral valve stenosis. Tricuspid Valve: The tricuspid valve is normal in structure. Tricuspid valve regurgitation is mild . No evidence of tricuspid stenosis. Aortic Valve: The aortic valve is tricuspid. Aortic valve regurgitation is not visualized. No aortic stenosis is present. Aortic valve mean gradient measures 2.7 mmHg. Aortic valve peak gradient measures 5.8 mmHg. Aortic valve area, by VTI measures 1.84 cm. Pulmonic Valve: The pulmonic valve was not well visualized. Pulmonic valve regurgitation is not visualized. No evidence of pulmonic stenosis. Aorta: The aortic root is normal in size and structure. Pulmonary Artery: 20. Venous: The inferior vena cava is normal in size with greater than 50% respiratory variability, suggesting right atrial pressure of 3 mmHg. IAS/Shunts: No atrial level shunt detected by color flow Doppler.  LEFT VENTRICLE PLAX 2D LVIDd:         4.50 cm  Diastology LVIDs:         3.20 cm  LV e' medial:  8.27 cm/s LV PW:         1.00 cm  LV e' lateral: 9.68 cm/s LV IVS:        0.80 cm LVOT diam:     1.90 cm LV SV:         50 LV SV Index:   32 LVOT Area:     2.84 cm  RIGHT VENTRICLE RV S prime:     11.50 cm/s TAPSE (M-mode): 1.9 cm LEFT ATRIUM             Index       RIGHT ATRIUM           Index LA diam:        2.90 cm 1.87 cm/m  RA Area:     20.30 cm LA Vol (A2C):   53.4 ml 34.46 ml/m RA Volume:   55.40 ml  35.75 ml/m LA Vol (A4C):   56.5 ml 36.46 ml/m LA Biplane Vol: 56.3 ml 36.34 ml/m  AORTIC VALVE AV Area (Vmax):    1.93 cm AV Area (Vmean):   1.84 cm AV Area (VTI):     1.84 cm AV Vmax:           120.64 cm/s AV Vmean:          77.494 cm/s AV VTI:             0.270 m AV Peak Grad:      5.8 mmHg AV Mean Grad:      2.7 mmHg LVOT Vmax:         82.10 cm/s LVOT Vmean:        50.400 cm/s LVOT VTI:          0.175 m LVOT/AV VTI ratio: 0.65  AORTA Ao Root diam: 3.00 cm TRICUSPID VALVE TR Peak grad:   19.7 mmHg TR Vmax:        222.00 cm/s  SHUNTS Systemic VTI:  0.18 m Systemic Diam: 1.90 cm Dina Rich MD Electronically signed by Dina Rich MD Signature Date/Time: 06/19/2020/5:01:30 PM    Final     EKG: Independently reviewed.  Initially with wide QRS tachycardia; subsequently self terminated in sinus rhythm.  Assessment/Plan 1-chest pain/mild elevation troponin -Heart score of 4 and prior history of paroxysmal atrial fibrillation -Per discussion with cardiology service-concern for  tachycardia induced demand ischemia -Mild flat troponin elevation so far appreciated -Wide QRS transient rhythm appreciated at time of ED arrival with subsequent transition to sinus rhythm. -Cycle troponin -Continue beta-blocker and aspirin -Follow 2D echo -Follow cardiology service recommendation -Will check lipid panel. -Replete and maintain electrolytes stable (potassium more than 4 and magnesium more than 2)  2-gastroesophageal reflux disease -Continue PPI  3-hypokalemia and hypomagnesemia -Replete electrolytes and follow trend -Starting daily potassium maintenance supplementation.  4-history of atrial fibrillation -Paroxysmal -Continue flecainide and metoprolol. -Patient is not chronically on anticoagulation.  5-hypertension -Currently stable -Continue current antihypertensive agents.  6-depression/anxiety -Continue Celexa -No suicidal ideation. -Stable mood  7-history of migraine -Currently denying headaches -Continue Topamax.   DVT prophylaxis: Lovenox Code Status:   Full code Family Communication: No family at bedside. Disposition Plan:   Patient is from:  Home  Anticipated DC to:  Home  Anticipated DC date:  06/20/2020  Anticipated  DC barriers: Complete evaluation/work-up for chest pain and mild hydration troponin.  Consults called:  Dr. Wyline Mood (cardiology service) Admission status:  Observation, Telemetry bed, length of stay less than 2 midnights.  Severity of Illness: Mild to moderate severity; heart score of 4 and concerns for tachycardia mediated demand ischemia.  Follow-up echo results, cycle troponin and monitor on EKG.  Will follow cardiology service recommendation.  Vassie Loll MD [facility]  How to contact the [facility] Attending or Consulting provider 7A - 7P or covering provider during after hours 7P -7A, for this patient?   1. Check the care team in [facility] and look for a) attending/consulting [facility] provider listed and b) the [facility] team listed 2. Log into [email or web address] and use [place]'s universal password to access. If you do not have the password, please contact the hospital operator. 3. Locate the [facility] provider you are looking for under [facility] and page to a number that you can be directly reached. 4. If you still have difficulty reaching the provider, please page the [facility] (Director on Call) for the Hospitalists listed on amion for assistance.  06/19/2020, 6:36 PM

## 2020-06-19 NOTE — ED Provider Notes (Signed)
[facility] EMERGENCY DEPARTMENT Provider Note   CSN: [id number] Arrival date & time: 06/19/20  1200     History Chief Complaint  Patient presents with  . Chest Pain    Alyssa Poole is a 60 y.o. female.  HPI 60 year old female presents with acute chest pain.  Symptoms started at around 11 AM.  She notes some discomfort in her left arm, shortness of breath and then chest pressure and tightness.  She also had some palpitations that went away.  Feels a little lightheaded.  She has a history of atrial fibrillation but states this is different.  Otherwise has not been sick recently including no fevers, cough, vomiting.   Past Medical History:  Diagnosis Date  . Anxiety   . Arthritis   . Asthma   . Atrial fibrillation (HCC)   . Chronic headaches   . Chronic neck pain   . Depression 2002  . Dysconjugate gaze   . GERD (gastroesophageal reflux disease)   . Hypertension   . Neuromuscular disorder (HCC)   . Osteoporosis   . Panic attacks 2002    Patient Active Problem List   Diagnosis Date Noted  . Impaired mobility and ADLs 03/08/2019  . H/O vertebral artery stenosis 10/19/2018  . S/P arthroscopy of left shoulder 11/14/16 04/29/2017  . Transaminitis 03/07/2017  . Suprapubic pain 03/05/2017  . Leukocytosis 02/25/2017  . Anemia 02/19/2017  . Atrial fibrillation (HCC) 02/19/2017  . 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 Cognitive deficit, post-stroke 02/19/2017  . Hyponatremia 02/19/2017  . SAH (subarachnoid hemorrhage) (HCC) 02/19/2017  . Arthrosis of left acromioclavicular joint   . Rotator cuff syndrome of left shoulder   . Primary osteoarthritis of left shoulder   . Surgical aftercare, musculoskeletal system 10/11/2014  . Status post total left knee replacement 09/14/14 10/11/2014  . Knee contusion 12/27/2013  . Medial meniscus, posterior horn derangement 12/27/2013  . Left knee pain 12/27/2013  . 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 KNEE PAIN 05/11/2007  . DISC  DEGENERATION 05/11/2007  . SCIATICA 05/11/2007    Past Surgical History:  Procedure Laterality Date  . BACK SURGERY     x4  . CERVICAL SPINE SURGERY     plate in neck  . CHOLECYSTECTOMY    . KNEE ARTHROSCOPY WITH MEDIAL MENISECTOMY Left 03/09/2014   Procedure: LEFT KNEE ARTHROSCOPY WITH PARTIAL MEDIAL MENISECTOMY;  Surgeon: Vickki Hearing, MD;  Location: [facility];  Service: Orthopedics;  Laterality: Left;  . SHOULDER ARTHROSCOPY Left 11/14/2016   Procedure: ARTHROSCOPY SHOULDER WITH LIMITED DEDBRIDEMENT AND ACROMIOPLASTY AND DISTAL CLAVICAL EXCISION;  Surgeon: Vickki Hearing, MD;  Location: [facility];  Service: Orthopedics;  Laterality: Left;  . TOTAL KNEE ARTHROPLASTY Left 09/14/2014   Procedure: LEFT TOTAL KNEE ARTHROPLASTY;  Surgeon: Vickki Hearing, MD;  Location: [facility];  Service: Orthopedics;  Laterality: Left;     OB History   No obstetric history on file.     Family History  Problem Relation Age of Onset  . Heart attack Mother   . Arthritis Mother   . Heart attack Father   . Cancer Father   . Diabetes Father     Social History   Tobacco Use  . Smoking status: Former Smoker    Packs/day: 1.00    Years: 41.00    Pack years: 41.00  . Smokeless tobacco: Never Used  . Tobacco comment: quit when she had stroke   Vaping Use  . Vaping Use: Former  Substance Use Topics  .  Alcohol use: No  . Drug use: No    Home Medications Prior to Admission medications   Medication Sig Start Date End Date Taking? Authorizing Provider  alendronate (FOSAMAX) 70 MG tablet Take 70 mg by mouth every 7 (seven) days. Takes on Sunday. Take with a full glass of water on an empty stomach.    [provider]  aspirin EC 81 MG tablet Take 81 mg by mouth daily. Swallow whole.    [provider]  Black Cohosh 540 MG CAPS Take 1 capsule by mouth daily.    [provider]  citalopram (CELEXA) 20 MG tablet Take 20 mg by mouth daily. 10/11/15   [provider]  [employer] CALCIUM 600+D 600-800 MG-UNIT TABS Take 1 tablet by mouth daily. 10/08/15   [provider]  gabapentin (NEURONTIN) 300 MG capsule Take 300 mg by mouth 3 (three) times daily.     [provider]  HYDROcodone-acetaminophen (NORCO/VICODIN) 5-325 MG tablet One tablet every four hours as needed for acute pain.  Limit of five days per [place]. 07/06/19   Darreld Mclean, MD  metoprolol tartrate (LOPRESSOR) 25 MG tablet TK 1 T PO BID 04/07/17   [provider]  naproxen (NAPROSYN) 500 MG tablet  09/05/19   [provider]  pantoprazole (PROTONIX) 40 MG tablet Take 40 mg by mouth Daily.  01/27/12   [provider]  topiramate (TOPAMAX) 50 MG tablet Take 50 mg by mouth daily.    [provider]    Allergies    Bee venom  Review of Systems   Review of Systems  Constitutional: Negative for fever.  Respiratory: Positive for shortness of breath.   Cardiovascular: Positive for chest pain and palpitations.  Gastrointestinal: Negative for diarrhea and vomiting.  Neurological: Positive for light-headedness.  All other systems reviewed and are negative.   Physical Exam Updated Vital Signs BP 96/82   Pulse (!) 152   Temp 98.6 F (37 C) (Oral)   Resp 17   SpO2 99%   Physical Exam Vitals and nursing note reviewed.  Constitutional:      Appearance: She is well-developed.  HENT:     Head: Normocephalic and atraumatic.     Right Ear: External ear normal.     Left Ear: External ear normal.     Nose: Nose normal.  Eyes:     General:        Right eye: No discharge.        Left eye: No discharge.  Cardiovascular:     Rate and Rhythm: Regular rhythm. Tachycardia present.     Heart sounds: Normal heart sounds.  Pulmonary:     Effort: Pulmonary effort is normal.     Breath sounds: Normal breath sounds.  Abdominal:     Palpations: Abdomen is soft.     Tenderness: There is no abdominal tenderness.  Skin:    General: Skin is warm and  dry.  Neurological:     Mental Status: She is alert.  Psychiatric:        Mood and Affect: Mood is not anxious.     ED Results / Procedures / Treatments   Labs (all labs ordered are listed, but only abnormal results are displayed) Labs Reviewed  BASIC METABOLIC PANEL - Abnormal; Notable for the following components:      Result Value   Potassium 3.2 (*)    CO2 20 (*)    Glucose, Bld 122 (*)    All  other components within normal limits  CBC WITH DIFFERENTIAL/PLATELET - Abnormal; Notable for the following components:   RBC 3.74 (*)    All other components within normal limits  MAGNESIUM - Abnormal; Notable for the following components:   Magnesium 1.6 (*)    All other components within normal limits  SARS CORONAVIRUS 2 (TAT 6-24 HRS)  TROPONIN I (HIGH SENSITIVITY)  TROPONIN I (HIGH SENSITIVITY)    EKG EKG Interpretation  Date/Time:  Monday June 19 2020 12:08:32 EDT Ventricular Rate:  159 PR Interval:    QRS Duration: 140 QT Interval:  307 QTC Calculation: 500 R Axis:   75 Text Interpretation: Wide QRS tachycardia Nonspecific intraventricular conduction delay Anteroseptal infarct, possibly acute Baseline wander in lead(s) V1 HR and wider QRS new since 2018 Reconfirmed by Pricilla Loveless [phone]) on 06/19/2020 12:34:05 PM   EKG Interpretation  Date/Time:  Monday June 19 2020 12:31:48 EDT Ventricular Rate:  73 PR Interval:    QRS Duration: 98 QT Interval:  416 QTC Calculation: 459 R Axis:   35 Text Interpretation: Sinus rhythm Low voltage, extremity and precordial leads Borderline repolarization abnormality QRS normalized compared to earlier, tachycardia resolved Confirmed by Pricilla Loveless [phone]) on 06/19/2020 12:36:01 PM        Radiology DG Chest Portable 1 View  Result Date: 06/19/2020 CLINICAL DATA:  Chest pain. EXAM: PORTABLE CHEST 1 VIEW COMPARISON:  January 29, 2017. FINDINGS: The heart size and mediastinal contours are within normal limits. Both lungs are  clear. No pneumothorax or pleural effusion is noted. The visualized skeletal structures are unremarkable. IMPRESSION: No active disease. Electronically Signed   By: Lupita Raider M.D.   On: 06/19/2020 12:58    Procedures Procedures   Medications Ordered in ED Medications  magnesium sulfate IVPB 2 g 50 mL (has no administration in time range)  potassium chloride SA (KLOR-CON) CR tablet 40 mEq (has no administration in time range)  potassium chloride 10 mEq in 100 mL IVPB (has no administration in time range)  sodium chloride 0.9 % bolus 1,000 mL (0 mLs Intravenous Stopped 06/19/20 1349)    ED Course  I have reviewed the triage vital signs and the nursing notes.  Pertinent labs & imaging results that were available during my care of the patient were reviewed by me and considered in my medical decision making (see chart for details).    MDM Rules/Calculators/A&P                          Patient presents with borderline blood pressures in addition to a heart rate in the 150s/160s.  No obvious P waves and so this is at least a supraventricular tachycardia though hard to tell if it is atrial flutter 2-1 or SVT.  The QRS is wider so ventricular tachycardia is also in the differential.  Fortunately while starting the work-up she seemed to break out of this on her own.  Discussed with Dr. Wyline Mood, this seems to be SVT with aberrancy.  He does recommend to still admit her for electrolyte replacement, monitoring, and echo.  Cardiology will consult.  Otherwise the patient feels well and the chest symptoms are all resolved.  Discussed with Dr. Gwenlyn Perking for admission. Final Clinical Impression(s) / ED Diagnoses Final diagnoses:  SVT (supraventricular tachycardia) (HCC)  Hypokalemia  Hypomagnesemia    Rx / DC Orders ED Discharge Orders    None       Pricilla Loveless, MD 06/19/20 1408

## 2020-06-19 NOTE — ED Notes (Signed)
X-ray at bedside

## 2020-06-20 DIAGNOSIS — I471 Supraventricular tachycardia: Secondary | ICD-10-CM | POA: Diagnosis not present

## 2020-06-20 DIAGNOSIS — E876 Hypokalemia: Secondary | ICD-10-CM | POA: Diagnosis not present

## 2020-06-20 DIAGNOSIS — R0789 Other chest pain: Secondary | ICD-10-CM

## 2020-06-20 DIAGNOSIS — I480 Paroxysmal atrial fibrillation: Secondary | ICD-10-CM | POA: Diagnosis not present

## 2020-06-20 DIAGNOSIS — R079 Chest pain, unspecified: Secondary | ICD-10-CM | POA: Diagnosis not present

## 2020-06-20 LAB — BASIC METABOLIC PANEL
Anion gap: 9 (ref 5–15)
BUN: 12 mg/dL (ref 8–23)
CO2: 18 mmol/L — ABNORMAL LOW (ref 22–32)
Calcium: 8.5 mg/dL — ABNORMAL LOW (ref 8.9–10.3)
Chloride: 113 mmol/L — ABNORMAL HIGH (ref 98–111)
Creatinine, Ser: 0.74 mg/dL (ref 0.44–1.00)
GFR, Estimated: >60 mL/min (ref >60)
Glucose, Bld: 91 mg/dL (ref 70–99)
Potassium: 3.4 mmol/L — ABNORMAL LOW (ref 3.5–5.1)
Sodium: 140 mmol/L (ref 135–145)

## 2020-06-20 LAB — HIV ANTIBODY (ROUTINE TESTING W REFLEX): HIV Screen 4th Generation wRfx: NONREACTIVE

## 2020-06-20 LAB — SARS CORONAVIRUS 2 (TAT 6-24 HRS): SARS Coronavirus 2: NEGATIVE

## 2020-06-20 LAB — MAGNESIUM: Magnesium: 2 mg/dL (ref 1.7–2.4)

## 2020-06-20 LAB — TROPONIN I (HIGH SENSITIVITY): Troponin I (High Sensitivity): 49 ng/L — ABNORMAL HIGH (ref <18)

## 2020-06-20 MED ORDER — POTASSIUM CHLORIDE CRYS ER 20 MEQ PO TBCR: 20.0000 meq | EXTENDED_RELEASE_TABLET | Freq: Every day | ORAL | 1 refills | Status: AC

## 2020-06-20 MED ORDER — PANTOPRAZOLE SODIUM 40 MG PO TBEC: 40.0000 mg | DELAYED_RELEASE_TABLET | Freq: Two times a day (BID) | ORAL | 2 refills | Status: AC

## 2020-06-20 MED ORDER — MAGNESIUM OXIDE 400 (241.3 MG) MG PO TABS: 400.0000 mg | ORAL_TABLET | Freq: Every day | ORAL | 1 refills | Status: AC

## 2020-06-20 NOTE — Consult Note (Signed)
Cardiology Consultation:   Patient ID: Alyssa Poole MRN: [id number]; DOB: 16-Nov-1960  Admit date: 06/19/2020 Date of Consult: 06/20/2020  PCP:  Oval Linsey, MD   [facility]  Cardiologist:  No primary care provider on file.  Advanced Practice Provider:  No care team member to display Electrophysiologist:  None [id number]    Patient Profile:   Alyssa Poole is a 60 y.o. female with a hx of PAF (not on anticoagulation due to prior subarachnoid hemorrhage), carotid stenting, HTN, osteoporosis, GERD, chronic headaches, former smoking and anxiety/depression who is being seen today for the evaluation of palpitations and chest discomfort at the request of Alyssa Loll, MD.  History of Present Illness:   [name] has a history of paroxysmal atrial fibrillation, prior subarachnoid hemorrhage, carotid stenting, osteoporosis, HTN, GERD, chronic headaches, former smoking & anxiety/depression, who presented to the hospital with complaints of left-sided chest pain. She reported palpitations along with some mild dyspnea. The episode lasted for 30-40 minutes. She described the chestpain as left sided, radiating to the left arm. No orthopnea, PND or leg swelling. She is not on anticoagulation for the AFIB due to a prior history of a subarachnoid hemorrhage. She takes Flecainide and Metoprolol for rhythm control.  In the ED, the ECG from 06/19/20 (at 12:08) showed wide complex tachycardia, LBBB morphology, at a rate of 159 bpm, very suspicious for AFIB with Aberrancy. Repeat ECG at 06/19/20 (12:29) confirmed AFIB at a slower rate of 105 bpm with narrow complexes. This ECG showed poor R progression in the anterior leads and some ST depressions anterolaterally.  The hs-Trops were 4, 43, 97, 110, 102 and 49. Electrolytes were abnormal (Mg 1.6 and K 3.2).   Echo from 06/19/20 showed LVEF 55-60%, mod dil LA and trivial MR.     Past Medical History:  Diagnosis Date  .  Anxiety   . Arthritis   . Asthma   . Atrial fibrillation (HCC)   . Chronic headaches   . Chronic neck pain   . Depression 2002  . Dysconjugate gaze   . GERD (gastroesophageal reflux disease)   . Hypertension   . Neuromuscular disorder (HCC)   . Osteoporosis   . Panic attacks 2002    Past Surgical History:  Procedure Laterality Date  . BACK SURGERY     x4  . CERVICAL SPINE SURGERY     plate in neck  . CHOLECYSTECTOMY    . KNEE ARTHROSCOPY WITH MEDIAL MENISECTOMY Left 03/09/2014   Procedure: LEFT KNEE ARTHROSCOPY WITH PARTIAL MEDIAL MENISECTOMY;  Surgeon: Vickki Hearing, MD;  Location: [facility];  Service: Orthopedics;  Laterality: Left;  . SHOULDER ARTHROSCOPY Left 11/14/2016   Procedure: ARTHROSCOPY SHOULDER WITH LIMITED DEDBRIDEMENT AND ACROMIOPLASTY AND DISTAL CLAVICAL EXCISION;  Surgeon: Vickki Hearing, MD;  Location: [facility];  Service: Orthopedics;  Laterality: Left;  . TOTAL KNEE ARTHROPLASTY Left 09/14/2014   Procedure: LEFT TOTAL KNEE ARTHROPLASTY;  Surgeon: Vickki Hearing, MD;  Location: [facility];  Service: Orthopedics;  Laterality: Left;     Home Medications:  Prior to Admission medications   Medication Sig Start Date End Date Taking? Authorizing Provider  alendronate (FOSAMAX) 70 MG tablet Take 70 mg by mouth every 7 (seven) days. Takes on Sunday. Take with a full glass of water on an empty stomach.   Yes [provider]  aspirin EC 81 MG tablet Take 81 mg by mouth daily. Swallow whole.   Yes [provider]  Gerrianne Scale  540 MG CAPS Take 1 capsule by mouth daily.   Yes [provider]  citalopram (CELEXA) 20 MG tablet Take 20 mg by mouth daily. 10/11/15  Yes [provider]  [employer] CALCIUM 600+D 600-800 MG-UNIT TABS Take 1 tablet by mouth daily. 10/08/15  Yes [provider]  flecainide (TAMBOCOR) 50 MG tablet Take 50 mg by mouth 2 times daily at 12 noon and 4 pm. 06/09/20  Yes [provider]   HYDROcodone-acetaminophen (NORCO/VICODIN) 5-325 MG tablet One tablet every four hours as needed for acute pain.  Limit of five days per [place] statue. Patient taking differently: Take 1 tablet by mouth every 4 (four) hours as needed for moderate pain. Limit of five days per [place] statue. 07/06/19  Yes Darreld Mclean, MD  metoprolol tartrate (LOPRESSOR) 25 MG tablet Take 25 mg by mouth 2 (two) times daily. 04/07/17  Yes [provider]  naproxen (NAPROSYN) 500 MG tablet Take 500 mg by mouth daily as needed for mild pain. 09/05/19  Yes [provider]  pantoprazole (PROTONIX) 40 MG tablet Take 40 mg by mouth Daily.  01/27/12  Yes [provider]  topiramate (TOPAMAX) 50 MG tablet Take 50 mg by mouth daily.   Yes [provider]    Inpatient Medications: Scheduled Meds: . aspirin EC  81 mg Oral Daily  . citalopram  20 mg Oral Daily  . enoxaparin (LOVENOX) injection  40 mg Subcutaneous Q24H  . flecainide  50 mg Oral q12n4p  . metoprolol tartrate  25 mg Oral BID  . pantoprazole  40 mg Oral BID  . potassium chloride  20 mEq Oral Daily  . topiramate  50 mg Oral Daily   Continuous Infusions:  PRN Meds: acetaminophen, HYDROcodone-acetaminophen, ondansetron (ZOFRAN) IV  Allergies:    Allergies  Allergen Reactions  . Bee Venom Swelling    Causes bad swelling. No epipen  needed    Social History:   Social History   Socioeconomic History  . Marital status: Married    Spouse name: Not on file  . Number of children: Not on file  . Years of education: Not on file  . Highest education level: Not on file  Occupational History  . Not on file  Tobacco Use  . Smoking status: Former Smoker    Packs/day: 1.00    Years: 41.00    Pack years: 41.00  . Smokeless tobacco: Never Used  . Tobacco comment: quit when she had stroke   Vaping Use  . Vaping Use: Former  Substance and Sexual Activity  . Alcohol use: No  . Drug use: No  . Sexual activity: Yes     Birth control/protection: None  Other Topics Concern  . Not on file  Social History Narrative  . Not on file   Social Determinants of Health   Financial Resource Strain: Not on file  Food Insecurity: Not on file  Transportation Needs: Not on file  Physical Activity: Not on file  Stress: Not on file  Social Connections: Not on file  Intimate Partner Violence: Not on file    Family History:    Family History  Problem Relation Age of Onset  . Heart attack Mother   . Arthritis Mother   . Heart attack Father   . Cancer Father   . Diabetes Father      ROS:  Please see the history of present illness.  All other ROS reviewed and negative.     Physical Exam/Data:  Vitals:   06/19/20 1709 06/19/20 2208 06/20/20 0602 06/20/20 0826  BP:  134/72 (!) 105/57 129/71  Pulse:  60 (!) 51 63  Resp:  18 18   Temp:  (!) 97.5 F (36.4 C) 97.6 F (36.4 C)   TempSrc:      SpO2:  99% 97%   Weight: 55.4 kg     Height:  (1.575 m)       Intake/Output Summary (Last 24 hours) at 06/20/2020 0925 Last data filed at 06/19/2020 1758 Gross per 24 hour  Intake 1390 ml  Output --  Net 1390 ml   Last 3 Weights 06/19/2020 11/10/2019 10/14/2019  Weight (lbs) 122 lb 2.2 oz 122 lb 122 lb  Weight (kg) 55.4 kg 55.339 kg 55.339 kg     Body mass index is 22.34 kg/m.  General:  Well nourished, well developed, in no acute distress HEENT: normal Lymph: no adenopathy Neck: no JVD Endocrine:  No thryomegaly Vascular: No carotid bruits; FA pulses 2+ bilaterally without bruits  Cardiac:  normal S1, S2; RRR; no murmur Lungs:  clear to auscultation bilaterally, no wheezing, rhonchi or rales  Abd: soft, nontender, no hepatomegaly  Ext: no edema Musculoskeletal:  No deformities, BUE and BLE strength normal and equal Skin: warm and dry  Neuro:  CNs 2-12 intact, no focal abnormalities noted Psych:  Normal affect    Telemetry:  Telemetry was personally reviewed and demonstrates:  Sinus rhythm, rate  in the 60s  Relevant CV Studies: Echo 06/19/2020 1. Left ventricular ejection fraction, by estimation, is 55 to 60%. The left ventricle has normal function. The left ventricle has no regional wall motion abnormalities. Left ventricular diastolic parameters were normal. 2. Right ventricular systolic function is normal. The right ventricular size is normal. There is normal pulmonary artery systolic pressure. 3. Left atrial size was moderately dilated. 4. The mitral valve is normal in structure. Trivial mitral valve regurgitation. No evidence of mitral stenosis. 5. The aortic valve is tricuspid. Aortic valve regurgitation is not visualized. No aortic stenosis is present. 6. The inferior vena cava is normal in size with greater than 50% respiratory variability, suggesting right atrial pressure of 3 mmHg.     Laboratory Data:  High Sensitivity Troponin:   Recent Labs  Lab 06/19/20 1424 06/19/20 1615 06/19/20 2048 06/19/20 2200 06/20/20 0535  TROPONINIHS 43* 97* 110* 102* 49*     Chemistry Recent Labs  Lab 06/19/20 1218 06/20/20 0535  NA 141 140  K 3.2* 3.4*  CL 108 113*  CO2 20* 18*  GLUCOSE 122* 91  BUN 13 12  CREATININE 1.00 0.74  CALCIUM 9.3 8.5*  GFRNONAA >60 >60  ANIONGAP 13 9    No results for input(s): PROT, ALBUMIN, AST, ALT, ALKPHOS, BILITOT in the last 168 hours. Hematology Recent Labs  Lab 06/19/20 1218  WBC 7.4  RBC 3.74*  HGB 12.0  HCT 36.7  MCV 98.1  MCH 32.1  MCHC 32.7  RDW 12.2  PLT 378   BNPNo results for input(s): BNP, PROBNP in the last 168 hours.  DDimer No results for input(s): DDIMER in the last 168 hours.   Radiology/Studies:  DG Chest Portable 1 View  Result Date: 06/19/2020 CLINICAL DATA:  Chest pain. EXAM: PORTABLE CHEST 1 VIEW COMPARISON:  January 29, 2017. FINDINGS: The heart size and mediastinal contours are within normal limits. Both lungs are clear. No pneumothorax or pleural effusion is noted. The visualized skeletal  structures are unremarkable. IMPRESSION: No active disease. Electronically Signed  By: Lupita Raider M.D.   On: 06/19/2020 12:58   ECHOCARDIOGRAM COMPLETE  Result Date: 06/19/2020    ECHOCARDIOGRAM REPORT   Patient Name:   RAFFAELLA EDISON Date of Exam: 06/19/2020 Medical Rec #:  [id number]         Height:       62.0 in Accession #:    [id number]        Weight:       122.0 lb Date of Birth:  03-Jul-1960        BSA:          1.549 m Patient Age:    59 years          BP:           136/78 mmHg Patient Gender: F                 HR:           64 bpm. [facility] Procedure: 2D Echo, Cardiac Doppler and Color Doppler Indications:    Chest Pain R07.9  History:        Patient has no prior history of Echocardiogram examinations.                 Arrythmias:Atrial Fibrillation; Risk Factors:Hypertension. GERD.  Sonographer:    Celesta Gentile RCS Referring Phys: [id number] Dorothe Pea BRANCH IMPRESSIONS  1. Left ventricular ejection fraction, by estimation, is 55 to 60%. The left ventricle has normal function. The left ventricle has no regional wall motion abnormalities. Left ventricular diastolic parameters were normal.  2. Right ventricular systolic function is normal. The right ventricular size is normal. There is normal pulmonary artery systolic pressure.  3. Left atrial size was moderately dilated.  4. The mitral valve is normal in structure. Trivial mitral valve regurgitation. No evidence of mitral stenosis.  5. The aortic valve is tricuspid. Aortic valve regurgitation is not visualized. No aortic stenosis is present.  6. The inferior vena cava is normal in size with greater than 50% respiratory variability, suggesting right atrial pressure of 3 mmHg. FINDINGS  Left Ventricle: Left ventricular ejection fraction, by estimation, is 55 to 60%. The left ventricle has normal function. The left ventricle has no regional wall motion abnormalities. The left ventricular internal cavity size was normal in size. There  is  no left ventricular hypertrophy. Left ventricular diastolic parameters were normal. Right Ventricle: The right ventricular size is normal. No increase in right ventricular wall thickness. Right ventricular systolic function is normal. There is normal pulmonary artery systolic pressure. The tricuspid regurgitant velocity is 2.22 m/s, and  with an assumed right atrial pressure of 8 mmHg, the estimated right ventricular systolic pressure is 27.7 mmHg. Left Atrium: Left atrial size was moderately dilated. Right Atrium: Right atrial size was normal in size. Pericardium: There is no evidence of pericardial effusion. Mitral Valve: The mitral valve is normal in structure. Trivial mitral valve regurgitation. No evidence of mitral valve stenosis. Tricuspid Valve: The tricuspid valve is normal in structure. Tricuspid valve regurgitation is mild . No evidence of tricuspid stenosis. Aortic Valve: The aortic valve is tricuspid. Aortic valve regurgitation is not visualized. No aortic stenosis is present. Aortic valve mean gradient measures 2.7 mmHg. Aortic valve peak gradient measures 5.8 mmHg. Aortic valve area, by VTI measures 1.84 cm. Pulmonic Valve: The pulmonic valve was not well visualized. Pulmonic valve regurgitation is not visualized. No evidence of pulmonic stenosis. Aorta: The aortic root is normal in size  and structure. Pulmonary Artery: 20. Venous: The inferior vena cava is normal in size with greater than 50% respiratory variability, suggesting right atrial pressure of 3 mmHg. IAS/Shunts: No atrial level shunt detected by color flow Doppler.  LEFT VENTRICLE PLAX 2D LVIDd:         4.50 cm  Diastology LVIDs:         3.20 cm  LV e' medial:  8.27 cm/s LV PW:         1.00 cm  LV e' lateral: 9.68 cm/s LV IVS:        0.80 cm LVOT diam:     1.90 cm LV SV:         50 LV SV Index:   32 LVOT Area:     2.84 cm  RIGHT VENTRICLE RV S prime:     11.50 cm/s TAPSE (M-mode): 1.9 cm LEFT ATRIUM             Index       RIGHT  ATRIUM           Index LA diam:        2.90 cm 1.87 cm/m  RA Area:     20.30 cm LA Vol (A2C):   53.4 ml 34.46 ml/m RA Volume:   55.40 ml  35.75 ml/m LA Vol (A4C):   56.5 ml 36.46 ml/m LA Biplane Vol: 56.3 ml 36.34 ml/m  AORTIC VALVE AV Area (Vmax):    1.93 cm AV Area (Vmean):   1.84 cm AV Area (VTI):     1.84 cm AV Vmax:           120.64 cm/s AV Vmean:          77.494 cm/s AV VTI:            0.270 m AV Peak Grad:      5.8 mmHg AV Mean Grad:      2.7 mmHg LVOT Vmax:         82.10 cm/s LVOT Vmean:        50.400 cm/s LVOT VTI:          0.175 m LVOT/AV VTI ratio: 0.65  AORTA Ao Root diam: 3.00 cm TRICUSPID VALVE TR Peak grad:   19.7 mmHg TR Vmax:        222.00 cm/s  SHUNTS Systemic VTI:  0.18 m Systemic Diam: 1.90 cm Dina RichJonathan Branch MD Electronically signed by Dina RichJonathan Branch MD Signature Date/Time: 06/19/2020/5:01:30 PM    Final      Assessment and Plan:   1. Atrial fibrillation with RVR (and aberrancy)  The patient had an episodes of palpitations yesterday (06/19/20) while at work. She had associated chest discomfort, diaphoresis and mild dyspnea. Her ECG in the ED showed AFIB with RVR (and aberrancy). Today the patient is back in sinus rhythm on telemetry.  -Currently not on anticoagulation due to hx of subarachnoid hemorrhage. She is being evaluated for a Watchman device implant by [facility]. -Continue Flecainide and metoprolol -Correct electrolytes (maintain K>4.0 and Mg>2.0)  2. Chest discomfort.  She described CP while in AFIB with RVR. Now that she is back in sinus she is asymptomatic. Could be demand ischemia from tachycardia. Resting ECG is abnormal with poor R progression in the anterior leads and slight ST depressions anterolaterally. Hs-Trops were mildly elevated.  -Ischemic work-up should be considered an as outpatient. She will discuss this with [facility]. -Continue ASA 81 mg daily -Consider statins -Check  a lipid panel   Risk  Assessment/Risk Scores:  [id number]}   CHA2DS2-VASc Score = 5 4 This indicates a 7.2% annual risk of stroke. The patient's score is based upon: CHF History: No HTN History: Yes Diabetes History: No Stroke History: Yes Vascular Disease History: Yes Age Score: 0 Gender Score: 1     For questions or updates, please contact [facility] Please consult [email or web address] for contact info under    Signed, Lonie Peak, MD  06/20/2020 9:25 AM

## 2020-06-20 NOTE — Discharge Summary (Signed)
Physician Discharge Summary  Alyssa Poole XVQ:[id number] DOB: 1960/06/28 DOA: 06/19/2020  PCP: Oval Linsey, MD  Admit date: 06/19/2020 Discharge date: 06/20/2020  Time spent: 30 minutes  Recommendations for Outpatient Follow-up:  1. Repeat basic metabolic panel and magnesium level to follow clinical stability 2. Patient to follow-up with electrophysiologist for final decision/treatment regarding watchman device.   Discharge Diagnoses:  Principal Problem:   SVT (supraventricular tachycardia) (HCC) Active Problems:   Chest pain   Hypokalemia   Hypomagnesemia   Discharge Condition: Stable and improved.  Discharged home with instruction to follow-up with cardiologist electrophysiologist and PCP.  CODE STATUS: Full code.  Diet recommendation: Heart healthy diet.  Filed Weights   06/19/20 1709  Weight: 55.4 kg    History of present illness:  Alyssa Poole is a 60 y.o. female with medical history significant of anxiety/depression, chronic headaches, hypertension, paroxysmal atrial fibrillation (not chronically on anticoagulation) and GERD; who presented to the hospital secondary to chest pain.  Patient reports sudden cessation, localized in the left side of her chest, radiated to her left arm and with mild associated dyspnea while present.  Lasted approximately 30 to 40 minutes; spontaneously resolved.  After further questioning patient expressed also some palpitations.  Denies nausea, vomiting, shortness of breath, coughing, fever, chills, dysuria, abdominal pain, hematuria, melena, hematochezia, focal weaknesses or any other complaints.  ED Course: Patient found on arrival with wide QRS tachycardia and complaints of chest discomfort.  Abnormal rhythm self terminated and currently sinus.  Patient found with mild hypokalemia and hypomagnesemia.  Case discussed with cardiology service who expressed concerns for his low-grade ventricular tachycardia recommended admission for  further evaluation and management.  Hospital Course:  1-chest pain/mild elevation troponin -Heart score of 4 and prior history of paroxysmal atrial fibrillation. -Per discussion with cardiology service-concern for tachycardia induced demand ischemia -Mild flat troponin elevation appreciated. -Continue beta-blocker and aspirin. -Unremarkable 2D echo results; preserved ejection fraction, no wall motion abnormalities. -Appreciate cardiology service recommendations and evaluation; no ischemic work-up while inpatient.  Outpatient follow-up with electrophysiology cardiologist for final decision regarding watchman device. -Lipid panel demonstrating stable cholesterol levels; advised to follow heart healthy diet. -Continue to follow electrolytes and further replete as needed.  2-gastroesophageal reflux disease -Continue PPI -Dose has been increased to twice a day for better protection and symptom management.  3-hypokalemia and hypomagnesemia -Electrolytes have been repleted -30 minutes supplementation started -Repeat basic metabolic panel and magnesium level at follow-up visit to reassess stability, (goal is for potassium above 4 and magnesium above 2).  4-history of atrial fibrillation -Paroxysmal -CHADSVASC score 4-5 -Continue flecainide and metoprolol. -Patient is not chronically on anticoagulation with history of subarachnoid hemorrhage.  5-hypertension -Currently stable and well-controlled -Continue current antihypertensive agents. -Patient advised to follow heart healthy diet.  6-depression/anxiety -No suicidal ideation or hallucinations -Continue the use of Celexa.   7-history of migraine -No headaches currently -Continue Topamax.  Procedures:  See below for x-ray report  2D echo: 1. Left ventricular ejection fraction, by estimation, is 55 to 60%. The left ventricle has normal function. The left ventricle has no regional wall motion abnormalities. Left  ventricular diastolic parameters were normal. 2. Right ventricular systolic function is normal. The right ventricular size is normal. There is normal pulmonary artery systolic pressure. 3. Left atrial size was moderately dilated. 4. The mitral valve is normal in structure. Trivial mitral valve regurgitation. No evidence of mitral stenosis. 5. The aortic valve is tricuspid. Aortic valve regurgitation is not visualized. No aortic  stenosis is present. 6. The inferior vena cava is normal in size with greater than 50% respiratory variability, suggesting right atrial pressure of 3 mmHg.  Consultations:  Cardiology service  Discharge Exam: Vitals:   06/20/20 0602 06/20/20 0826  BP: (!) 105/57 129/71  Pulse: (!) 51 63  Resp: 18   Temp: 97.6 F (36.4 C)   SpO2: 97%     General: no CP, no SOB, no palpitations. Patient denies nausea, vomting and abd pain. Feeling ready to go home. Cardiovascular: S1-S2, no rubs, no gallops, no JVD. Respiratory: Good air movement bilaterally.  No using accessory muscle.  Good saturation on room air Abdomen: Soft, nontender, nondistended, positive bowel sounds Extremities: No cyanosis or clubbing.  Discharge Instructions   Discharge Instructions    Diet - low sodium heart healthy   Complete by: As directed    Discharge instructions   Complete by: As directed    Take medications as prescribed Maintain adequate hydration Follow low-sodium diet and monitor nutrition facts of everything you eat and drink. Follow-up as instructed with electrophysiologist (Dr. Alfonso Ellis)   Increase activity slowly   Complete by: As directed      Allergies as of 06/20/2020      Reactions   Bee Venom Swelling   Causes bad swelling. No epipen  needed      Medication List    TAKE these medications   alendronate 70 MG tablet Commonly known as: FOSAMAX Take 70 mg by mouth every 7 (seven) days. Takes on Sunday. Take with a full glass of water on an empty stomach.    aspirin EC 81 MG tablet Take 81 mg by mouth daily. Swallow whole.   Black Cohosh 540 MG Caps Take 1 capsule by mouth daily.   citalopram 20 MG tablet Commonly known as: CELEXA Take 20 mg by mouth daily.   [employer] Calcium 600+D 600-800 MG-UNIT Tabs Generic drug: Calcium Carb-Cholecalciferol Take 1 tablet by mouth daily.   flecainide 50 MG tablet Commonly known as: TAMBOCOR Take 50 mg by mouth 2 times daily at 12 noon and 4 pm.   HYDROcodone-acetaminophen 5-325 MG tablet Commonly known as: NORCO/VICODIN One tablet every four hours as needed for acute pain.  Limit of five days per [place] statue. What changed:   how much to take  how to take this  when to take this  reasons to take this  additional instructions   magnesium oxide 400 (241.3 Mg) MG tablet Commonly known as: MagOx 400 Take 1 tablet (400 mg total) by mouth daily.   metoprolol tartrate 25 MG tablet Commonly known as: LOPRESSOR Take 25 mg by mouth 2 (two) times daily.   naproxen 500 MG tablet Commonly known as: NAPROSYN Take 500 mg by mouth daily as needed for mild pain.   pantoprazole 40 MG tablet Commonly known as: PROTONIX Take 1 tablet (40 mg total) by mouth 2 (two) times daily. What changed: when to take this   potassium chloride SA 20 MEQ tablet Commonly known as: KLOR-CON Take 1 tablet (20 mEq total) by mouth daily. Start taking on: June 21, 2020   topiramate 50 MG tablet Commonly known as: TOPAMAX Take 50 mg by mouth daily.      Allergies  Allergen Reactions  . Bee Venom Swelling    Causes bad swelling. No epipen  needed    Follow-up Information    Beaty, Remi Deter, MD. Schedule an appointment as soon as possible for a visit in 2 week(s).  Specialty: Cardiology Contact information: [address] [name] [address] [phone]               The results of significant diagnostics from this hospitalization (including imaging, microbiology, ancillary and  laboratory) are listed below for reference.    Significant Diagnostic Studies: DG Chest Portable 1 View  Result Date: 06/19/2020 CLINICAL DATA:  Chest pain. EXAM: PORTABLE CHEST 1 VIEW COMPARISON:  January 29, 2017. FINDINGS: The heart size and mediastinal contours are within normal limits. Both lungs are clear. No pneumothorax or pleural effusion is noted. The visualized skeletal structures are unremarkable. IMPRESSION: No active disease. Electronically Signed   By: Lupita RaiderJames  Green Jr M.D.   On: 06/19/2020 12:58   ECHOCARDIOGRAM COMPLETE  Result Date: 06/19/2020    ECHOCARDIOGRAM REPORT   Patient Name:   Alyssa Poole Date of Exam: 06/19/2020 Medical Rec #:  [id number]         Height:       62.0 in Accession #:    [id number]        Weight:       122.0 lb Date of Birth:  19-Feb-1961        BSA:          1.549 m Patient Age:    59 years          BP:           136/78 mmHg Patient Gender: F                 HR:           64 bpm. [facility] Procedure: 2D Echo, Cardiac Doppler and Color Doppler Indications:    Chest Pain R07.9  History:        Patient has no prior history of Echocardiogram examinations.                 Arrythmias:Atrial Fibrillation; Risk Factors:Hypertension. GERD.  Sonographer:    Celesta GentileBernard White RCS Referring Phys: [id number] Dorothe PeaJONATHAN F BRANCH IMPRESSIONS  1. Left ventricular ejection fraction, by estimation, is 55 to 60%. The left ventricle has normal function. The left ventricle has no regional wall motion abnormalities. Left ventricular diastolic parameters were normal.  2. Right ventricular systolic function is normal. The right ventricular size is normal. There is normal pulmonary artery systolic pressure.  3. Left atrial size was moderately dilated.  4. The mitral valve is normal in structure. Trivial mitral valve regurgitation. No evidence of mitral stenosis.  5. The aortic valve is tricuspid. Aortic valve regurgitation is not visualized. No aortic stenosis is present.  6. The  inferior vena cava is normal in size with greater than 50% respiratory variability, suggesting right atrial pressure of 3 mmHg. FINDINGS  Left Ventricle: Left ventricular ejection fraction, by estimation, is 55 to 60%. The left ventricle has normal function. The left ventricle has no regional wall motion abnormalities. The left ventricular internal cavity size was normal in size. There is  no left ventricular hypertrophy. Left ventricular diastolic parameters were normal. Right Ventricle: The right ventricular size is normal. No increase in right ventricular wall thickness. Right ventricular systolic function is normal. There is normal pulmonary artery systolic pressure. The tricuspid regurgitant velocity is 2.22 m/s, and  with an assumed right atrial pressure of 8 mmHg, the estimated right ventricular systolic pressure is 27.7 mmHg. Left Atrium: Left atrial size was moderately dilated. Right Atrium: Right atrial size was normal in size. Pericardium: There is  no evidence of pericardial effusion. Mitral Valve: The mitral valve is normal in structure. Trivial mitral valve regurgitation. No evidence of mitral valve stenosis. Tricuspid Valve: The tricuspid valve is normal in structure. Tricuspid valve regurgitation is mild . No evidence of tricuspid stenosis. Aortic Valve: The aortic valve is tricuspid. Aortic valve regurgitation is not visualized. No aortic stenosis is present. Aortic valve mean gradient measures 2.7 mmHg. Aortic valve peak gradient measures 5.8 mmHg. Aortic valve area, by VTI measures 1.84 cm. Pulmonic Valve: The pulmonic valve was not well visualized. Pulmonic valve regurgitation is not visualized. No evidence of pulmonic stenosis. Aorta: The aortic root is normal in size and structure. Pulmonary Artery: 20. Venous: The inferior vena cava is normal in size with greater than 50% respiratory variability, suggesting right atrial pressure of 3 mmHg. IAS/Shunts: No atrial level shunt detected by color  flow Doppler.  LEFT VENTRICLE PLAX 2D LVIDd:         4.50 cm  Diastology LVIDs:         3.20 cm  LV e' medial:  8.27 cm/s LV PW:         1.00 cm  LV e' lateral: 9.68 cm/s LV IVS:        0.80 cm LVOT diam:     1.90 cm LV SV:         50 LV SV Index:   32 LVOT Area:     2.84 cm  RIGHT VENTRICLE RV S prime:     11.50 cm/s TAPSE (M-mode): 1.9 cm LEFT ATRIUM             Index       RIGHT ATRIUM           Index LA diam:        2.90 cm 1.87 cm/m  RA Area:     20.30 cm LA Vol (A2C):   53.4 ml 34.46 ml/m RA Volume:   55.40 ml  35.75 ml/m LA Vol (A4C):   56.5 ml 36.46 ml/m LA Biplane Vol: 56.3 ml 36.34 ml/m  AORTIC VALVE AV Area (Vmax):    1.93 cm AV Area (Vmean):   1.84 cm AV Area (VTI):     1.84 cm AV Vmax:           120.64 cm/s AV Vmean:          77.494 cm/s AV VTI:            0.270 m AV Peak Grad:      5.8 mmHg AV Mean Grad:      2.7 mmHg LVOT Vmax:         82.10 cm/s LVOT Vmean:        50.400 cm/s LVOT VTI:          0.175 m LVOT/AV VTI ratio: 0.65  AORTA Ao Root diam: 3.00 cm TRICUSPID VALVE TR Peak grad:   19.7 mmHg TR Vmax:        222.00 cm/s  SHUNTS Systemic VTI:  0.18 m Systemic Diam: 1.90 cm Dina Rich MD Electronically signed by Dina Rich MD Signature Date/Time: 06/19/2020/5:01:30 PM    Final     Microbiology: Recent Results (from the past 240 hour(s))  SARS CORONAVIRUS 2 (TAT 6-24 HRS) Nasopharyngeal Nasopharyngeal Swab     Status: None   Collection Time: 06/19/20  1:24 PM   Specimen: Nasopharyngeal Swab  Result Value Ref Range Status   SARS Coronavirus 2 NEGATIVE NEGATIVE Final    Comment: (NOTE) SARS-CoV-2 target  nucleic acids are NOT DETECTED.  The SARS-CoV-2 RNA is generally detectable in upper and lower respiratory specimens during the acute phase of infection. Negative results do not preclude SARS-CoV-2 infection, do not rule out co-infections with other pathogens, and should not be used as the sole basis for treatment or other patient management decisions. Negative  results must be combined with clinical observations, patient history, and epidemiological information. The expected result is Negative.  Fact Sheet for Patients: HairSlick.no  Fact Sheet for Healthcare Providers: quierodirigir.com  This test is not yet approved or cleared by the [place] FDA and  has been authorized for detection and/or diagnosis of SARS-CoV-2 by FDA under an Emergency Use Authorization (EUA). This EUA will remain  in effect (meaning this test can be used) for the duration of the COVID-19 declaration under Se ction 564(b)(1) of the Act, 21 U.S.C. section 360bbb-3(b)(1), unless the authorization is terminated or revoked sooner.  Performed at [facility], [address] [place], [place] [address]      Labs: Basic Metabolic Panel: Recent Labs  Lab 06/19/20 1218 06/20/20 0535  NA 141 140  K 3.2* 3.4*  CL 108 113*  CO2 20* 18*  GLUCOSE 122* 91  BUN 13 12  CREATININE 1.00 0.74  CALCIUM 9.3 8.5*  MG 1.6* 2.0   CBC: Recent Labs  Lab 06/19/20 1218  WBC 7.4  NEUTROABS 4.8  HGB 12.0  HCT 36.7  MCV 98.1  PLT 378    CBG: Recent Labs  Lab 06/19/20 2211  GLUCAP 101*    Signed:  Vassie Loll MD.  [facility] 06/20/2020, 11:58 AM

## 2020-08-25 DIAGNOSIS — Z95818 Presence of other cardiac implants and grafts: Secondary | ICD-10-CM | POA: Insufficient documentation

## 2020-11-21 ENCOUNTER — Other Ambulatory Visit: Payer: Self-pay | Admitting: Internal Medicine

## 2020-11-21 DIAGNOSIS — Z1231 Encounter for screening mammogram for malignant neoplasm of breast: Secondary | ICD-10-CM

## 2022-08-07 ENCOUNTER — Other Ambulatory Visit: Payer: Self-pay

## 2022-08-07 DIAGNOSIS — Z780 Asymptomatic menopausal state: Secondary | ICD-10-CM

## 2022-08-14 ENCOUNTER — Ambulatory Visit (HOSPITAL_COMMUNITY)
Admission: RE | Admit: 2022-08-14 | Discharge: 2022-08-14 | Disposition: A | Discharge status: Home / Self Care | Payer: Medicare HMO | Source: Ambulatory Visit | Attending: Internal Medicine | Admitting: Internal Medicine

## 2022-08-14 DIAGNOSIS — Z780 Asymptomatic menopausal state: Secondary | ICD-10-CM | POA: Insufficient documentation

## 2022-09-10 ENCOUNTER — Encounter: Payer: Self-pay | Admitting: Obstetrics & Gynecology

## 2022-09-10 ENCOUNTER — Ambulatory Visit (INDEPENDENT_AMBULATORY_CARE_PROVIDER_SITE_OTHER): Payer: Medicare HMO | Admitting: Obstetrics & Gynecology

## 2022-09-10 VITALS — BP 124/74 | HR 66 | Ht 62.0 in | Wt 129.0 lb

## 2022-09-10 DIAGNOSIS — N905 Atrophy of vulva: Secondary | ICD-10-CM | POA: Diagnosis not present

## 2022-09-10 NOTE — Progress Notes (Signed)
Chief Complaint  Patient presents with   Gynecologic Exam      62 y.o. G1P1001 No LMP recorded. Patient is postmenopausal. The current method of family planning is post menopausal status.  Outpatient Encounter Medications as of 09/10/2022  Medication Sig   alendronate (FOSAMAX) 70 MG tablet Take 70 mg by mouth every 7 (seven) days. Takes on Sunday. Take with a full glass of water on an empty stomach.   aspirin EC 81 MG tablet Take 81 mg by mouth daily. Swallow whole.   atorvastatin (LIPITOR) 10 MG tablet Take 10 mg by mouth at bedtime.   Black Cohosh 540 MG CAPS Take 1 capsule by mouth daily.   [employer] CALCIUM 600+D 600-800 MG-UNIT TABS Take 1 tablet by mouth daily.   escitalopram (LEXAPRO) 10 MG tablet Take 10 mg by mouth daily.   HYDROcodone-acetaminophen (NORCO) 10-325 MG tablet Take 1 tablet by mouth every 6 (six) hours as needed.   magnesium oxide (MAGOX 400) 400 (241.3 Mg) MG tablet Take 1 tablet (400 mg total) by mouth daily.   naproxen (NAPROSYN) 500 MG tablet Take 500 mg by mouth daily as needed for mild pain.   pantoprazole (PROTONIX) 40 MG tablet Take 1 tablet (40 mg total) by mouth 2 (two) times daily.   potassium chloride SA (KLOR-CON) 20 MEQ tablet Take 1 tablet (20 mEq total) by mouth daily.   pregabalin (LYRICA) 25 MG capsule Take 25 mg by mouth 3 (three) times daily.   sotalol (BETAPACE) 120 MG tablet Take 120 mg by mouth 2 (two) times daily.   topiramate (TOPAMAX) 50 MG tablet Take 50 mg by mouth daily.   [DISCONTINUED] citalopram (CELEXA) 20 MG tablet Take 20 mg by mouth daily.   [DISCONTINUED] flecainide (TAMBOCOR) 50 MG tablet Take 50 mg by mouth 2 times daily at 12 noon and 4 pm.   [DISCONTINUED] HYDROcodone-acetaminophen (NORCO/VICODIN) 5-325 MG tablet One tablet every four hours as needed for acute pain.  Limit of five days per [place] statue. (Patient taking differently: Take 1 tablet by mouth every 4 (four) hours as needed for moderate pain. Limit of five  days per [place] statue.)   [DISCONTINUED] metoprolol tartrate (LOPRESSOR) 25 MG tablet Take 25 mg by mouth 2 (two) times daily.   No facility-administered encounter medications on file as of 09/10/2022.    Subjective Pt comes in with the sensation something has changed  in fact wondering if she is growing a penis No pain No discharge No hair loss or  changes No acne No voice changes  Past Medical History:  Diagnosis Date   Anxiety    Arthritis    Asthma    Atrial fibrillation (HCC)    Chronic headaches    Chronic neck pain    Depression 2002   Dysconjugate gaze    GERD (gastroesophageal reflux disease)    Hypertension    Neuromuscular disorder (HCC)    Osteoporosis    Panic attacks 2002   Stroke [facility])     Past Surgical History:  Procedure Laterality Date   BACK SURGERY     x4   CERVICAL SPINE SURGERY     plate in neck   CHOLECYSTECTOMY     KNEE ARTHROSCOPY WITH MEDIAL MENISECTOMY Left 03/09/2014   Procedure: LEFT KNEE ARTHROSCOPY WITH PARTIAL MEDIAL MENISECTOMY;  Surgeon: Vickki Hearing, MD;  Location: [facility];  Service: Orthopedics;  Laterality: Left;   SHOULDER ARTHROSCOPY Left 11/14/2016   Procedure: ARTHROSCOPY SHOULDER WITH  LIMITED DEDBRIDEMENT AND ACROMIOPLASTY AND DISTAL CLAVICAL EXCISION;  Surgeon: Vickki Hearing, MD;  Location: [facility];  Service: Orthopedics;  Laterality: Left;   TOTAL KNEE ARTHROPLASTY Left 09/14/2014   Procedure: LEFT TOTAL KNEE ARTHROPLASTY;  Surgeon: Vickki Hearing, MD;  Location: [facility];  Service: Orthopedics;  Laterality: Left;    OB History     Gravida  1   Para  1   Term  1   Preterm      AB      Living  1      SAB      IAB      Ectopic      Multiple      Live Births              Allergies  Allergen Reactions   Bee Venom Swelling    Causes bad swelling. No epipen  needed    Social History   Socioeconomic History   Marital status: Married    Spouse name: Not on file   Number of  children: Not on file   Years of education: Not on file   Highest education level: Not on file  Occupational History   Not on file  Tobacco Use   Smoking status: Former    Packs/day: 1.00    Years: 41.00    Additional pack years: 0.00    Total pack years: 41.00    Types: Cigarettes   Smokeless tobacco: Never   Tobacco comments:    quit when she had stroke   Vaping Use   Vaping Use: Former  Substance and Sexual Activity   Alcohol use: No   Drug use: No   Sexual activity: Yes    Birth control/protection: None  Other Topics Concern   Not on file  Social History Narrative   Not on file   Social Determinants of Health   Financial Resource Strain: Not on file  Food Insecurity: Not on file  Transportation Needs: Not on file  Physical Activity: Not on file  Stress: Not on file  Social Connections: Not on file    Family History  Problem Relation Age of Onset   Heart attack Mother    Arthritis Mother    Heart attack Father    Cancer Father    Diabetes Father     Medications:       Current Outpatient Medications:    alendronate (FOSAMAX) 70 MG tablet, Take 70 mg by mouth every 7 (seven) days. Takes on Sunday. Take with a full glass of water on an empty stomach., Disp: , Rfl:    aspirin EC 81 MG tablet, Take 81 mg by mouth daily. Swallow whole., Disp: , Rfl:    atorvastatin (LIPITOR) 10 MG tablet, Take 10 mg by mouth at bedtime., Disp: , Rfl:    Black Cohosh 540 MG CAPS, Take 1 capsule by mouth daily., Disp: , Rfl:    CVS CALCIUM 600+D 600-800 MG-UNIT TABS, Take 1 tablet by mouth daily., Disp: , Rfl: 11   escitalopram (LEXAPRO) 10 MG tablet, Take 10 mg by mouth daily., Disp: , Rfl:    HYDROcodone-acetaminophen (NORCO) 10-325 MG tablet, Take 1 tablet by mouth every 6 (six) hours as needed., Disp: , Rfl:    magnesium oxide (MAGOX 400) 400 (241.3 Mg) MG tablet, Take 1 tablet (400 mg total) by mouth daily., Disp: 30 tablet, Rfl: 1   naproxen (NAPROSYN) 500 MG tablet, Take 500  mg by mouth daily as needed for  mild pain., Disp: , Rfl:    pantoprazole (PROTONIX) 40 MG tablet, Take 1 tablet (40 mg total) by mouth 2 (two) times daily., Disp: 60 tablet, Rfl: 2   potassium chloride SA (KLOR-CON) 20 MEQ tablet, Take 1 tablet (20 mEq total) by mouth daily., Disp: 30 tablet, Rfl: 1   pregabalin (LYRICA) 25 MG capsule, Take 25 mg by mouth 3 (three) times daily., Disp: , Rfl:    sotalol (BETAPACE) 120 MG tablet, Take 120 mg by mouth 2 (two) times daily., Disp: , Rfl:    topiramate (TOPAMAX) 50 MG tablet, Take 50 mg by mouth daily., Disp: , Rfl:   Objective Blood pressure 124/74, pulse 66, height 5\' 2"  (1.575 m), weight 129 lb (58.5 kg).  General WDWN female NAD Vulva:  normal appearing vulva with no masses, tenderness or lesions Vagina:  normal mucosa, no discharge  No clitoromegaly is identified No uterine or bladder prolapse is identified 1 small deflated hemorrhoid  I believe she is noticing her clitoris due to addition by subtraction of the vulvar tissue due to menopause   Pertinent ROS No burning with urination, frequency or urgency No nausea, vomiting or diarrhea Nor fever chills or other constitutional symptoms   Labs or studies     Impression + Management Plan: Diagnoses this Encounter::   ICD-10-CM   1. 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  Normal menopausal changes.  Pt/husband reassured, no evidence hyperandrogenism        Medications prescribed during  this encounter: No orders of the defined types were placed in this encounter.   Labs or Scans Ordered during this encounter: No orders of the defined types were placed in this encounter.     Follow up Return if symptoms worsen or fail to improve.

## 2022-10-13 ENCOUNTER — Emergency Department (HOSPITAL_COMMUNITY)
Admission: EM | Admit: 2022-10-13 | Discharge: 2022-10-13 | Disposition: A | Discharge status: Home / Self Care | Payer: Medicare HMO | Attending: Emergency Medicine | Admitting: Emergency Medicine

## 2022-10-13 ENCOUNTER — Other Ambulatory Visit: Payer: Self-pay

## 2022-10-13 ENCOUNTER — Encounter (HOSPITAL_COMMUNITY): Payer: Self-pay | Admitting: Emergency Medicine

## 2022-10-13 DIAGNOSIS — R0602 Shortness of breath: Secondary | ICD-10-CM | POA: Insufficient documentation

## 2022-10-13 DIAGNOSIS — I10 Essential (primary) hypertension: Secondary | ICD-10-CM | POA: Insufficient documentation

## 2022-10-13 DIAGNOSIS — R112 Nausea with vomiting, unspecified: Secondary | ICD-10-CM | POA: Diagnosis not present

## 2022-10-13 DIAGNOSIS — J45909 Unspecified asthma, uncomplicated: Secondary | ICD-10-CM | POA: Diagnosis not present

## 2022-10-13 DIAGNOSIS — Z7982 Long term (current) use of aspirin: Secondary | ICD-10-CM | POA: Diagnosis not present

## 2022-10-13 DIAGNOSIS — R197 Diarrhea, unspecified: Secondary | ICD-10-CM | POA: Diagnosis not present

## 2022-10-13 LAB — CBC
HCT: 31.3 % — ABNORMAL LOW (ref 36.0–46.0)
Hemoglobin: 10.7 g/dL — ABNORMAL LOW (ref 12.0–15.0)
MCH: 32 pg (ref 26.0–34.0)
MCHC: 34.2 g/dL (ref 30.0–36.0)
MCV: 93.7 fL (ref 80.0–100.0)
Platelets: 245 10*3/uL (ref 150–400)
RBC: 3.34 MIL/uL — ABNORMAL LOW (ref 3.87–5.11)
RDW: 12.7 % (ref 11.5–15.5)
WBC: 10.3 10*3/uL (ref 4.0–10.5)
nRBC: 0 % (ref 0.0–0.2)

## 2022-10-13 LAB — URINALYSIS, ROUTINE W REFLEX MICROSCOPIC
Bacteria, UA: NONE SEEN
Bilirubin Urine: NEGATIVE
Glucose, UA: NEGATIVE mg/dL
Ketones, ur: NEGATIVE mg/dL
Nitrite: NEGATIVE
Protein, ur: 30 mg/dL — AB
Specific Gravity, Urine: 1.018 (ref 1.005–1.030)
pH: 5 (ref 5.0–8.0)

## 2022-10-13 LAB — COMPREHENSIVE METABOLIC PANEL
ALT: 20 U/L (ref 0–44)
AST: 30 U/L (ref 15–41)
Albumin: 3.6 g/dL (ref 3.5–5.0)
Alkaline Phosphatase: 45 U/L (ref 38–126)
Anion gap: 9 (ref 5–15)
BUN: 12 mg/dL (ref 8–23)
CO2: 21 mmol/L — ABNORMAL LOW (ref 22–32)
Calcium: 8.3 mg/dL — ABNORMAL LOW (ref 8.9–10.3)
Chloride: 99 mmol/L (ref 98–111)
Creatinine, Ser: 0.93 mg/dL (ref 0.44–1.00)
GFR, Estimated: >60 mL/min (ref >60)
Glucose, Bld: 117 mg/dL — ABNORMAL HIGH (ref 70–99)
Potassium: 3.1 mmol/L — ABNORMAL LOW (ref 3.5–5.1)
Sodium: 129 mmol/L — ABNORMAL LOW (ref 135–145)
Total Bilirubin: 0.5 mg/dL (ref 0.3–1.2)
Total Protein: 7.1 g/dL (ref 6.5–8.1)

## 2022-10-13 LAB — LIPASE, BLOOD: Lipase: 23 U/L (ref 11–51)

## 2022-10-13 MED ORDER — POTASSIUM CHLORIDE CRYS ER 20 MEQ PO TBCR
40.0000 meq | EXTENDED_RELEASE_TABLET | Freq: Once | ORAL | Status: AC
  Administered 2022-10-13: 40 meq via ORAL
  Filled 2022-10-13: qty 2

## 2022-10-13 MED ORDER — SODIUM CHLORIDE 0.9 % IV BOLUS
1000.0000 mL | Freq: Once | INTRAVENOUS | Status: AC
  Administered 2022-10-13: 1000 mL via INTRAVENOUS

## 2022-10-13 MED ORDER — ONDANSETRON HCL 4 MG PO TABS: 4.0000 mg | ORAL_TABLET | Freq: Four times a day (QID) | ORAL | 0 refills | Status: AC

## 2022-10-13 MED ORDER — ACETAMINOPHEN 500 MG PO TABS
1000.0000 mg | ORAL_TABLET | Freq: Once | ORAL | Status: AC
  Administered 2022-10-13: 1000 mg via ORAL
  Filled 2022-10-13: qty 2

## 2022-10-13 MED ORDER — ONDANSETRON HCL 4 MG/2ML IJ SOLN
4.0000 mg | Freq: Once | INTRAMUSCULAR | Status: AC
  Administered 2022-10-13: 4 mg via INTRAVENOUS
  Filled 2022-10-13: qty 2

## 2022-10-13 NOTE — ED Provider Notes (Signed)
[place] EMERGENCY DEPARTMENT AT [facility] Provider Note   CSN: [id number] Arrival date & time: 10/13/22  1124     History  Chief Complaint  Patient presents with   Diarrhea   Emesis   HPI Alyssa Poole is a 62 y.o. female with history of GERD, asthma, atrial fibrillation and hypertension presenting for diarrhea and emesis.  Symptoms started acutely yesterday.  Had 1 occurrence of vomit and 15-20 episodes of diarrhea yesterday.  Output is nonbloody.  Denies urinary symptoms.  Denies abdominal pain.  States she is concerned she is dehydrated.  States feels somewhat short of breath.  Denies chest pain.  Also states she has had a fever at home.   Diarrhea Associated symptoms: vomiting   Emesis Associated symptoms: diarrhea        Home Medications Prior to Admission medications   Medication Sig Start Date End Date Taking? Authorizing Provider  ondansetron (ZOFRAN) 4 MG tablet Take 1 tablet (4 mg total) by mouth every 6 (six) hours. 10/13/22  Yes Gareth Eagle, PA-C  alendronate (FOSAMAX) 70 MG tablet Take 70 mg by mouth every 7 (seven) days. Takes on Sunday. Take with a full glass of water on an empty stomach.    [provider]  aspirin EC 81 MG tablet Take 81 mg by mouth daily. Swallow whole.    [provider]  atorvastatin (LIPITOR) 10 MG tablet Take 10 mg by mouth at bedtime. 07/23/22   [provider]  Black Cohosh 540 MG CAPS Take 1 capsule by mouth daily.    [provider]  [employer] CALCIUM 600+D 600-800 MG-UNIT TABS Take 1 tablet by mouth daily. 10/08/15   [provider]  escitalopram (LEXAPRO) 10 MG tablet Take 10 mg by mouth daily.    [provider]  HYDROcodone-acetaminophen (NORCO) 10-325 MG tablet Take 1 tablet by mouth every 6 (six) hours as needed.    [provider]  magnesium oxide (MAGOX 400) 400 (241.3 Mg) MG tablet Take 1 tablet (400 mg total) by mouth daily. 06/20/20   Vassie Loll,  MD  naproxen (NAPROSYN) 500 MG tablet Take 500 mg by mouth daily as needed for mild pain. 09/05/19   [provider]  pantoprazole (PROTONIX) 40 MG tablet Take 1 tablet (40 mg total) by mouth 2 (two) times daily. 06/20/20   Vassie Loll, MD  potassium chloride SA (KLOR-CON) 20 MEQ tablet Take 1 tablet (20 mEq total) by mouth daily. 06/21/20   Vassie Loll, MD  pregabalin (LYRICA) 25 MG capsule Take 25 mg by mouth 3 (three) times daily. 09/06/22   [provider]  sotalol (BETAPACE) 120 MG tablet Take 120 mg by mouth 2 (two) times daily.    [provider]  topiramate (TOPAMAX) 50 MG tablet Take 50 mg by mouth daily.    [provider]      Allergies    Bee venom    Review of Systems   Review of Systems  Gastrointestinal:  Positive for diarrhea and vomiting.    Physical Exam Updated Vital Signs BP (!) 113/58   Pulse 67   Temp 99.4 F (37.4 C) (Oral)   Resp 18   Ht 5\' 2"  (1.575 m)   Wt 58.5 kg   SpO2 96%   BMI 23.59 kg/m  Physical Exam Vitals and nursing note reviewed.  HENT:     Head: Normocephalic and atraumatic.     Mouth/Throat:     Mouth: Mucous membranes are  moist.  Eyes:     General:        Right eye: No discharge.        Left eye: No discharge.     Conjunctiva/sclera: Conjunctivae normal.  Cardiovascular:     Rate and Rhythm: Normal rate and regular rhythm.     Pulses: Normal pulses.     Heart sounds: Normal heart sounds.  Pulmonary:     Effort: Pulmonary effort is normal.     Breath sounds: Normal breath sounds.  Abdominal:     General: Abdomen is flat. There is no distension.     Palpations: Abdomen is soft.     Tenderness: There is no abdominal tenderness.  Skin:    General: Skin is warm and dry.  Neurological:     General: No focal deficit present.  Psychiatric:        Mood and Affect: Mood normal.     ED Results / Procedures / Treatments   Labs (all labs ordered are listed, but only abnormal results are  displayed) Labs Reviewed  COMPREHENSIVE METABOLIC PANEL - Abnormal; Notable for the following components:      Result Value   Sodium 129 (*)    Potassium 3.1 (*)    CO2 21 (*)    Glucose, Bld 117 (*)    Calcium 8.3 (*)    All other components within normal limits  CBC - Abnormal; Notable for the following components:   RBC 3.34 (*)    Hemoglobin 10.7 (*)    HCT 31.3 (*)    All other components within normal limits  URINALYSIS, ROUTINE W REFLEX MICROSCOPIC - Abnormal; Notable for the following components:   Hgb urine dipstick MODERATE (*)    Protein, ur 30 (*)    Leukocytes,Ua TRACE (*)    All other components within normal limits  LIPASE, BLOOD    EKG None  Radiology No results found.  Procedures Procedures    Medications Ordered in ED Medications  potassium chloride SA (KLOR-CON M) CR tablet 40 mEq (has no administration in time range)  sodium chloride 0.9 % bolus 1,000 mL (0 mLs Intravenous Stopped 10/13/22 1403)  ondansetron (ZOFRAN) injection 4 mg (4 mg Intravenous Given 10/13/22 1258)  acetaminophen (TYLENOL) tablet 1,000 mg (1,000 mg Oral Given 10/13/22 1258)    ED Course/ Medical Decision Making/ A&P                             Medical Decision Making Amount and/or Complexity of Data Reviewed Labs: ordered.  Risk OTC drugs. Prescription drug management.   Initial Impression and Ddx 62 year old well-appearing female presenting for emesis and diarrhea.  Exam was unremarkable with a nontender nondistended abdomen.  DDx includes viral gastroenteritis, other intra-abdominal infection, dehydration, electrolyte derangement and sepsis. Patient PMH that increases complexity of ED encounter:  GERD, asthma, atrial fibrillation and hypertension   Interpretation of Diagnostics - I independent reviewed and interpreted the labs as followed: hyponatremia, hypokalemia, anemia   Patient Reassessment and Ultimate Disposition/Management Treated with IV fluids, treated  fever with Tylenol and Zofran for nausea.  On reassessment she felt better.  Treat her low potassium with oral potassium.  Fever improved with Tylenol.  Symptoms are consistent with viral gastroenteritis.  Doubt any other intra-abdominal infection given no tenderness.  Advised continued assertive hydration at home.  Follow-up with PCP if her symptoms persist.  Discussed pertinent return precautions.  Discharged home.  Patient management required  discussion with the following services or consulting groups:  None  Complexity of Problems Addressed Acute complicated illness or Injury  Additional Data Reviewed and Analyzed Further history obtained from: Further history from spouse/family member and Past medical history and medications listed in the EMR  Patient Encounter Risk Assessment Prescriptions         Final Clinical Impression(s) / ED Diagnoses Final diagnoses:  Nausea vomiting and diarrhea    Rx / DC Orders ED Discharge Orders          Ordered    ondansetron (ZOFRAN) 4 MG tablet  Every 6 hours        10/13/22 1545              Gareth Eagle, PA-C 10/13/22 1545    Bethann Berkshire, MD 10/14/22 1649

## 2022-10-13 NOTE — Discharge Instructions (Signed)
Evaluation today revealed that he likely have a viral GI illness.  Recommend he continue assertive hydration at home with water and Gatorade.  He can take Zofran as needed for nausea and vomiting.  If your symptoms persist please follow-up with your doctor.  If you have inability to tolerate fluid intake, abdominal pain, fever, any other concerning symptom please return emerged part for further evaluation.

## 2022-10-13 NOTE — ED Triage Notes (Signed)
Pt via POV c/o vomiting and diarrhea since yesterday. Pt has vomited once but has had 15-20 episodes of diarrhea. She thinks she may be dehydrated. Pt notes she also feels SOB today.

## 2022-10-16 ENCOUNTER — Encounter (HOSPITAL_COMMUNITY): Payer: Self-pay | Admitting: Emergency Medicine

## 2022-10-16 ENCOUNTER — Other Ambulatory Visit: Payer: Self-pay

## 2022-10-16 ENCOUNTER — Observation Stay (HOSPITAL_COMMUNITY)
Admission: EM | Admit: 2022-10-16 | Discharge: 2022-10-18 | Disposition: A | Discharge status: Home / Self Care | Payer: Medicare HMO | Attending: Family Medicine | Admitting: Family Medicine

## 2022-10-16 DIAGNOSIS — Z8673 Personal history of transient ischemic attack (TIA), and cerebral infarction without residual deficits: Secondary | ICD-10-CM | POA: Insufficient documentation

## 2022-10-16 DIAGNOSIS — Z8679 Personal history of other diseases of the circulatory system: Secondary | ICD-10-CM

## 2022-10-16 DIAGNOSIS — Z96652 Presence of left artificial knee joint: Secondary | ICD-10-CM | POA: Insufficient documentation

## 2022-10-16 DIAGNOSIS — I480 Paroxysmal atrial fibrillation: Secondary | ICD-10-CM | POA: Diagnosis not present

## 2022-10-16 DIAGNOSIS — A020 Salmonella enteritis: Secondary | ICD-10-CM | POA: Diagnosis not present

## 2022-10-16 DIAGNOSIS — Z79899 Other long term (current) drug therapy: Secondary | ICD-10-CM | POA: Insufficient documentation

## 2022-10-16 DIAGNOSIS — Z87891 Personal history of nicotine dependence: Secondary | ICD-10-CM | POA: Insufficient documentation

## 2022-10-16 DIAGNOSIS — R197 Diarrhea, unspecified: Secondary | ICD-10-CM | POA: Diagnosis present

## 2022-10-16 DIAGNOSIS — J45909 Unspecified asthma, uncomplicated: Secondary | ICD-10-CM | POA: Insufficient documentation

## 2022-10-16 DIAGNOSIS — E860 Dehydration: Secondary | ICD-10-CM | POA: Diagnosis not present

## 2022-10-16 DIAGNOSIS — E871 Hypo-osmolality and hyponatremia: Secondary | ICD-10-CM | POA: Insufficient documentation

## 2022-10-16 DIAGNOSIS — I10 Essential (primary) hypertension: Secondary | ICD-10-CM | POA: Diagnosis not present

## 2022-10-16 DIAGNOSIS — Z7982 Long term (current) use of aspirin: Secondary | ICD-10-CM | POA: Diagnosis not present

## 2022-10-16 DIAGNOSIS — E876 Hypokalemia: Secondary | ICD-10-CM | POA: Diagnosis not present

## 2022-10-16 DIAGNOSIS — N179 Acute kidney failure, unspecified: Secondary | ICD-10-CM | POA: Insufficient documentation

## 2022-10-16 DIAGNOSIS — I69319 Unspecified symptoms and signs involving cognitive functions following cerebral infarction: Secondary | ICD-10-CM

## 2022-10-16 DIAGNOSIS — Z789 Other specified health status: Secondary | ICD-10-CM | POA: Diagnosis present

## 2022-10-16 DIAGNOSIS — D649 Anemia, unspecified: Secondary | ICD-10-CM | POA: Diagnosis present

## 2022-10-16 DIAGNOSIS — D500 Iron deficiency anemia secondary to blood loss (chronic): Secondary | ICD-10-CM | POA: Diagnosis not present

## 2022-10-16 DIAGNOSIS — I4589 Other specified conduction disorders: Secondary | ICD-10-CM | POA: Diagnosis not present

## 2022-10-16 DIAGNOSIS — R9431 Abnormal electrocardiogram [ECG] [EKG]: Secondary | ICD-10-CM | POA: Diagnosis present

## 2022-10-16 LAB — COMPREHENSIVE METABOLIC PANEL
ALT: 38 U/L (ref 0–44)
AST: 45 U/L — ABNORMAL HIGH (ref 15–41)
Albumin: 3.7 g/dL (ref 3.5–5.0)
Alkaline Phosphatase: 53 U/L (ref 38–126)
Anion gap: 9 (ref 5–15)
BUN: 15 mg/dL (ref 8–23)
CO2: 19 mmol/L — ABNORMAL LOW (ref 22–32)
Calcium: 9.2 mg/dL (ref 8.9–10.3)
Chloride: 105 mmol/L (ref 98–111)
Creatinine, Ser: 1.28 mg/dL — ABNORMAL HIGH (ref 0.44–1.00)
GFR, Estimated: 47 mL/min — ABNORMAL LOW (ref >60)
Glucose, Bld: 118 mg/dL — ABNORMAL HIGH (ref 70–99)
Potassium: 2.7 mmol/L — CL (ref 3.5–5.1)
Sodium: 133 mmol/L — ABNORMAL LOW (ref 135–145)
Total Bilirubin: 0.3 mg/dL (ref 0.3–1.2)
Total Protein: 8 g/dL (ref 6.5–8.1)

## 2022-10-16 LAB — C DIFFICILE QUICK SCREEN W PCR REFLEX
C Diff antigen: NEGATIVE
C Diff interpretation: NOT DETECTED
C Diff toxin: NEGATIVE

## 2022-10-16 LAB — URINALYSIS, ROUTINE W REFLEX MICROSCOPIC
Bilirubin Urine: NEGATIVE
Glucose, UA: NEGATIVE mg/dL
Ketones, ur: NEGATIVE mg/dL
Nitrite: NEGATIVE
Protein, ur: NEGATIVE mg/dL
Specific Gravity, Urine: 1.004 — ABNORMAL LOW (ref 1.005–1.030)
WBC, UA: >50 WBC/hpf (ref 0–5)
pH: 6 (ref 5.0–8.0)

## 2022-10-16 LAB — CBC WITH DIFFERENTIAL/PLATELET
Abs Immature Granulocytes: 0.05 10*3/uL (ref 0.00–0.07)
Basophils Absolute: 0.1 10*3/uL (ref 0.0–0.1)
Basophils Relative: 1 %
Eosinophils Absolute: 0 10*3/uL (ref 0.0–0.5)
Eosinophils Relative: 1 %
HCT: 34.2 % — ABNORMAL LOW (ref 36.0–46.0)
Hemoglobin: 11.7 g/dL — ABNORMAL LOW (ref 12.0–15.0)
Immature Granulocytes: 1 %
Lymphocytes Relative: 12 %
Lymphs Abs: 0.9 10*3/uL (ref 0.7–4.0)
MCH: 30.5 pg (ref 26.0–34.0)
MCHC: 34.2 g/dL (ref 30.0–36.0)
MCV: 89.3 fL (ref 80.0–100.0)
Monocytes Absolute: 1.4 10*3/uL — ABNORMAL HIGH (ref 0.1–1.0)
Monocytes Relative: 19 %
Neutro Abs: 4.9 10*3/uL (ref 1.7–7.7)
Neutrophils Relative %: 66 %
Platelets: 350 10*3/uL (ref 150–400)
RBC: 3.83 MIL/uL — ABNORMAL LOW (ref 3.87–5.11)
RDW: 13.2 % (ref 11.5–15.5)
WBC: 7.3 10*3/uL (ref 4.0–10.5)
nRBC: 0 % (ref 0.0–0.2)

## 2022-10-16 LAB — MAGNESIUM: Magnesium: 2 mg/dL (ref 1.7–2.4)

## 2022-10-16 LAB — LIPASE, BLOOD: Lipase: 27 U/L (ref 11–51)

## 2022-10-16 MED ORDER — PROCHLORPERAZINE EDISYLATE 10 MG/2ML IJ SOLN: 10.0000 mg | INTRAMUSCULAR | Status: DC | PRN

## 2022-10-16 MED ORDER — ENOXAPARIN SODIUM 40 MG/0.4ML IJ SOSY
40.0000 mg | PREFILLED_SYRINGE | INTRAMUSCULAR | Status: DC
  Administered 2022-10-16 – 2022-10-17 (×2): 40 mg via SUBCUTANEOUS
  Filled 2022-10-16 (×2): qty 0.4

## 2022-10-16 MED ORDER — ACETAMINOPHEN 325 MG PO TABS: 650.0000 mg | ORAL_TABLET | Freq: Four times a day (QID) | ORAL | Status: DC | PRN

## 2022-10-16 MED ORDER — POTASSIUM CHLORIDE 10 MEQ/100ML IV SOLN
INTRAVENOUS | Status: AC
  Filled 2022-10-16: qty 100

## 2022-10-16 MED ORDER — POTASSIUM CHLORIDE CRYS ER 20 MEQ PO TBCR
40.0000 meq | EXTENDED_RELEASE_TABLET | Freq: Once | ORAL | Status: AC
  Administered 2022-10-16: 40 meq via ORAL
  Filled 2022-10-16: qty 2

## 2022-10-16 MED ORDER — LOPERAMIDE HCL 2 MG PO CAPS
2.0000 mg | ORAL_CAPSULE | Freq: Three times a day (TID) | ORAL | Status: DC
  Administered 2022-10-17 – 2022-10-18 (×4): 2 mg via ORAL
  Filled 2022-10-16 (×4): qty 1

## 2022-10-16 MED ORDER — SODIUM CHLORIDE 0.9 % IV SOLN: INTRAVENOUS | Status: DC

## 2022-10-16 MED ORDER — LOPERAMIDE HCL 2 MG PO CAPS
2.0000 mg | ORAL_CAPSULE | Freq: Once | ORAL | Status: AC
  Administered 2022-10-16: 2 mg via ORAL
  Filled 2022-10-16: qty 1

## 2022-10-16 MED ORDER — OXYCODONE HCL 5 MG PO TABS: 5.0000 mg | ORAL_TABLET | Freq: Four times a day (QID) | ORAL | Status: DC | PRN

## 2022-10-16 MED ORDER — SODIUM CHLORIDE 0.9 % IV SOLN
2.0000 g | INTRAVENOUS | Status: DC
  Administered 2022-10-16 – 2022-10-17 (×2): 2 g via INTRAVENOUS
  Filled 2022-10-16 (×2): qty 20

## 2022-10-16 MED ORDER — FENTANYL CITRATE PF 50 MCG/ML IJ SOSY: 12.5000 ug | PREFILLED_SYRINGE | INTRAMUSCULAR | Status: DC | PRN

## 2022-10-16 MED ORDER — POTASSIUM CHLORIDE 10 MEQ/100ML IV SOLN
10.0000 meq | INTRAVENOUS | Status: AC
  Administered 2022-10-16 (×3): 10 meq via INTRAVENOUS
  Filled 2022-10-16 (×3): qty 100

## 2022-10-16 MED ORDER — RIFAXIMIN 200 MG PO TABS
200.0000 mg | ORAL_TABLET | Freq: Three times a day (TID) | ORAL | Status: DC
  Administered 2022-10-16 – 2022-10-18 (×5): 200 mg via ORAL
  Filled 2022-10-16 (×9): qty 1

## 2022-10-16 MED ORDER — ACETAMINOPHEN 650 MG RE SUPP: 650.0000 mg | Freq: Four times a day (QID) | RECTAL | Status: DC | PRN

## 2022-10-16 MED ORDER — LACTATED RINGERS IV BOLUS
1000.0000 mL | Freq: Once | INTRAVENOUS | Status: AC
  Administered 2022-10-16: 1000 mL via INTRAVENOUS

## 2022-10-16 MED ORDER — LOPERAMIDE HCL 2 MG PO CAPS
2.0000 mg | ORAL_CAPSULE | ORAL | Status: DC | PRN
  Administered 2022-10-16: 2 mg via ORAL
  Filled 2022-10-16: qty 1

## 2022-10-16 MED ORDER — TRAZODONE HCL 50 MG PO TABS: 25.0000 mg | ORAL_TABLET | Freq: Every evening | ORAL | Status: DC | PRN

## 2022-10-16 NOTE — H&P (Signed)
History and Physical  [facility]  Alyssa Poole UJW:[id number] DOB: 05-26-1960 DOA: 10/16/2022  [facility]P: [facility]  Patient coming from: Home  Level of care: Telemetry  I have personally briefly reviewed patient's old medical records in [facility]  Chief Complaint: persistent diarrhea   HPI: Alyssa Poole is a 62 y/o female with history of paroxysmal atrial fibrillation with aberrancy (not on anticoagulation due to history of SAH), GERD, osteoporosis, HTN, chronic headaches, former smoker, Depression/Anxiety, history of carotid stenting presented to ED for second time in last several days with complaints of ongoing diarrheal illness, malaise. She had been seen on 7/14 with acute diarrhea that started after eating burritos and then was having 15-20 diarrheal stools per day, she was hypokalemic, she was treated supportively and then discharged home.  She returns today with persistent diarrhea of about 7-8 times per day. No blood seen. She has been using imodium and pepto-bismol with no relief or improvement. She is reporting that everytime she is eating or drinking it goes through her system rapidly.  She denies any recent travel and no recent use of antibiotics.  Her labs today reveal acute kidney injury and hypokalemia and hyponatremia.  She is being admitted for further management for a diarrheal illness that has failed outpatient management.    Past Medical History:  Diagnosis Date   Anxiety    Arthritis    Asthma    Atrial fibrillation (HCC)    Chronic headaches    Chronic neck pain    Depression 2002   Dysconjugate gaze    GERD (gastroesophageal reflux disease)    Hypertension    Neuromuscular disorder (HCC)    Osteoporosis    Panic attacks 2002   Stroke [facility] [place])     Past Surgical History:  Procedure Laterality Date   BACK SURGERY     x4   CERVICAL SPINE SURGERY     plate in neck   CHOLECYSTECTOMY     KNEE ARTHROSCOPY WITH MEDIAL MENISECTOMY  Left 03/09/2014   Procedure: LEFT KNEE ARTHROSCOPY WITH PARTIAL MEDIAL MENISECTOMY;  Surgeon: Vickki Hearing, MD;  Location: [facility];  Service: Orthopedics;  Laterality: Left;   SHOULDER ARTHROSCOPY Left 11/14/2016   Procedure: ARTHROSCOPY SHOULDER WITH LIMITED DEDBRIDEMENT AND ACROMIOPLASTY AND DISTAL CLAVICAL EXCISION;  Surgeon: Vickki Hearing, MD;  Location: [facility];  Service: Orthopedics;  Laterality: Left;   TOTAL KNEE ARTHROPLASTY Left 09/14/2014   Procedure: LEFT TOTAL KNEE ARTHROPLASTY;  Surgeon: Vickki Hearing, MD;  Location: [facility];  Service: Orthopedics;  Laterality: Left;     reports that she has quit smoking. Her smoking use included cigarettes. She has a 41 pack-year smoking history. She has never used smokeless tobacco. She reports that she does not drink alcohol and does not use drugs.  Allergies  Allergen Reactions   Bee Venom Swelling    Causes bad swelling. No epipen  needed    Family History  Problem Relation Age of Onset   Heart attack Mother    Arthritis Mother    Heart attack Father    Cancer Father    Diabetes Father     Prior to Admission medications   Medication Sig Start Date End Date Taking? Authorizing Provider  alendronate (FOSAMAX) 70 MG tablet Take 70 mg by mouth every 7 (seven) days. Takes on Sunday. Take with a full glass of water on an empty stomach.    [provider]  aspirin EC 81 MG tablet Take  81 mg by mouth daily. Swallow whole.    [provider]  atorvastatin (LIPITOR) 10 MG tablet Take 10 mg by mouth at bedtime. 07/23/22   [provider]  Black Cohosh 540 MG CAPS Take 1 capsule by mouth daily.    [provider]  [employer] CALCIUM 600+D 600-800 MG-UNIT TABS Take 1 tablet by mouth daily. 10/08/15   [provider]  escitalopram (LEXAPRO) 10 MG tablet Take 10 mg by mouth daily.    [provider]  HYDROcodone-acetaminophen (NORCO) 10-325 MG tablet Take 1 tablet by mouth every 6 (six) hours  as needed.    [provider]  magnesium oxide (MAGOX 400) 400 (241.3 Mg) MG tablet Take 1 tablet (400 mg total) by mouth daily. 06/20/20   Vassie Loll, MD  naproxen (NAPROSYN) 500 MG tablet Take 500 mg by mouth daily as needed for mild pain. 09/05/19   [provider]  ondansetron (ZOFRAN) 4 MG tablet Take 1 tablet (4 mg total) by mouth every 6 (six) hours. 10/13/22   Gareth Eagle, PA-C  pantoprazole (PROTONIX) 40 MG tablet Take 1 tablet (40 mg total) by mouth 2 (two) times daily. 06/20/20   Vassie Loll, MD  potassium chloride SA (KLOR-CON) 20 MEQ tablet Take 1 tablet (20 mEq total) by mouth daily. 06/21/20   Vassie Loll, MD  pregabalin (LYRICA) 25 MG capsule Take 25 mg by mouth 3 (three) times daily. 09/06/22   [provider]  sotalol (BETAPACE) 120 MG tablet Take 120 mg by mouth 2 (two) times daily.    [provider]  topiramate (TOPAMAX) 50 MG tablet Take 50 mg by mouth daily.    [provider]    Physical Exam: Vitals:   10/16/22 1109 10/16/22 1110 10/16/22 1230 10/16/22 1245  BP: 100/67  119/63 127/61  Pulse: 62  (!) 50 (!) 51  Resp: 18  15 17   Temp: 98.2 F (36.8 C)     TempSrc: Oral     SpO2: 98%  96% 100%  Weight:  56.7 kg    Height:  5\' 2"  (1.575 m)      Constitutional: NAD, calm, comfortable Eyes: PERRL, lids and conjunctivae normal ENMT: Mucous membranes are dry. Posterior pharynx clear of any exudate or lesions.  Neck: normal, supple, no masses, no thyromegaly Respiratory: clear to auscultation bilaterally, no wheezing, no crackles. Normal respiratory effort. No accessory muscle use.  Cardiovascular: normal s1, s2 sounds, no murmurs / rubs / gallops. No extremity edema. 2+ pedal pulses. No carotid bruits.  Abdomen: no tenderness, no masses palpated. No hepatosplenomegaly. Bowel sounds positive.  Musculoskeletal: no clubbing / cyanosis. No joint deformity upper and lower extremities. Good ROM, no contractures. Normal  muscle tone.  Skin: no rashes, lesions, ulcers. No induration Neurologic: CN 2-12 grossly intact. Sensation intact, DTR normal. Strength 5/5 in all 4.  Psychiatric: Normal judgment and insight. Alert and oriented x 3. Normal mood.   Labs on Admission: I have personally reviewed following labs and imaging studies  CBC: Recent Labs  Lab 10/13/22 1205 10/16/22 1214  WBC 10.3 7.3  NEUTROABS  --  4.9  HGB 10.7* 11.7*  HCT 31.3* 34.2*  MCV 93.7 89.3  PLT 245 350   Basic Metabolic Panel: Recent Labs  Lab 10/13/22 1205 10/16/22 1111 10/16/22 1133  NA 129* 133*  --   K 3.1* 2.7*  --   CL 99 105  --   CO2 21* 19*  --   GLUCOSE 117* 118*  --  BUN 12 15  --   CREATININE 0.93 1.28*  --   CALCIUM 8.3* 9.2  --   MG  --   --  2.0   GFR: Estimated Creatinine Clearance: 35.6 mL/min (A) (by C-G formula based on SCr of 1.28 mg/dL (H)). Liver Function Tests: Recent Labs  Lab 10/13/22 1205 10/16/22 1111  AST 30 45*  ALT 20 38  ALKPHOS 45 53  BILITOT 0.5 0.3  PROT 7.1 8.0  ALBUMIN 3.6 3.7   Recent Labs  Lab 10/13/22 1205 10/16/22 1111  LIPASE 23 27   No results for input(s): "AMMONIA" in the last 168 hours. Coagulation Profile: No results for input(s): "INR", "PROTIME" in the last 168 hours. Cardiac Enzymes: No results for input(s): "CKTOTAL", "CKMB", "CKMBINDEX", "TROPONINI" in the last 168 hours. BNP (last 3 results) No results for input(s): "PROBNP" in the last 8760 hours. HbA1C: No results for input(s): "HGBA1C" in the last 72 hours. CBG: No results for input(s): "GLUCAP" in the last 168 hours. Lipid Profile: No results for input(s): "CHOL", "HDL", "LDLCALC", "TRIG", "CHOLHDL", "LDLDIRECT" in the last 72 hours. Thyroid Function Tests: No results for input(s): "TSH", "T4TOTAL", "FREET4", "T3FREE", "THYROIDAB" in the last 72 hours. Anemia Panel: No results for input(s): "VITAMINB12", "FOLATE", "FERRITIN", "TIBC", "IRON", "RETICCTPCT" in the last 72 hours. Urine  analysis:    Component Value Date/Time   COLORURINE YELLOW 10/13/2022 1444   APPEARANCEUR CLEAR 10/13/2022 1444   LABSPEC 1.018 10/13/2022 1444   PHURINE 5.0 10/13/2022 1444   GLUCOSEU NEGATIVE 10/13/2022 1444   HGBUR MODERATE (A) 10/13/2022 1444   BILIRUBINUR NEGATIVE 10/13/2022 1444   KETONESUR NEGATIVE 10/13/2022 1444   PROTEINUR 30 (A) 10/13/2022 1444   UROBILINOGEN 0.2 01/15/2011 1251   NITRITE NEGATIVE 10/13/2022 1444   LEUKOCYTESUR TRACE (A) 10/13/2022 1444    Radiological Exams on Admission: No results found.  EKG: Independently reviewed. Sinus rhythm with prolonged QT interval seen   Assessment/Plan Principal Problem:   Acute renal failure (ARF) (HCC) Active Problems:   Normocytic anemia   Cognitive deficit, post-stroke   H/O vertebral artery stenosis   Hyponatremia   Impaired mobility and ADLs   Hypokalemia   Dehydration   Diarrhea   Prolonged Q-T interval on ECG   Diarrheal illness  - after 5 days of supportive measures, continues to have diarrhea  - continue symptomatic management with IV fluids - replacing electrolytes - send stool for testing -- c diff and GI pathogen panel  - given 5 days of persistent diarrhea, planning to start ciprofloxacin after stool samples collected   Hypokalemia - Mg level is reassuring at 2.2 - oral and IV replacement ordered - recheck BMP in AM  - check Mg in AM   Normocytic Anemia  - recheck Hg in AM - no s/s of blood loss found at this time  - if Hg drops further, hemoccult to stool  Prolonged Qt - likely from home medication - she is on sotalol BID - replacing electrolytes as ordered   Dehydration  - secondary to GI losses - continue IV fluid hydration as ordered   Hyponatremia  - from ongoing GI losses - continue IV fluid hydration   DVT prophylaxis: enoxaparin   Code Status: Full   Family Communication:   Disposition Plan: anticipate DC home   Consults called:   Admission status: OBV  Level of care:  Telemetry Standley Dakins MD [facility] How to contact the [facility] Attending or Consulting provider 7A - 7P or covering provider during  after hours 7P -7A, for this patient?  Check the care team in [facility] and look for a) attending/consulting [facility] provider listed and b) the [facility] team listed Log into [email or web address] and use [place]'s universal password to access. If you do not have the password, please contact the hospital operator. Locate the [facility] provider you are looking for under [facility] and page to a number that you can be directly reached. If you still have difficulty reaching the provider, please page the [facility] (Director on Call) for the Hospitalists listed on amion for assistance.   If 7PM-7AM, please contact night-coverage [email or web address] Password [email or web address]  10/16/2022, 1:00 PM

## 2022-10-16 NOTE — Hospital Course (Signed)
62 y/o female with history of paroxysmal atrial fibrillation with aberrancy (not on anticoagulation due to history of SAH), GERD, osteoporosis, HTN, chronic headaches, former smoker, Depression/Anxiety, history of carotid stenting presented to ED for second time in last several days with complaints of ongoing diarrheal illness, malaise. She had been seen on 7/14 with acute diarrhea that started after eating burritos and then was having 15-20 diarrheal stools per day, she was hypokalemic, she was treated supportively and then discharged home.  She returns today with persistent diarrhea of about 7-8 times per day. No blood seen. She has been using imodium and pepto-bismol with no relief or improvement. She is reporting that everytime she is eating or drinking it goes through her system rapidly.  She denies any recent travel and no recent use of antibiotics.  Her labs today reveal acute kidney injury and hypokalemia and hyponatremia.  She is being admitted for further management for a diarrheal illness that has failed outpatient management.

## 2022-10-16 NOTE — ED Provider Notes (Signed)
[place] EMERGENCY DEPARTMENT AT [facility] Provider Note   CSN: [id number] Arrival date & time: 10/16/22  1027     History  Chief Complaint  Patient presents with   Diarrhea    Alyssa Poole is a 62 y.o. female.  HPI 62 year old female with a history of A-fib, hypertension, stroke, presents with diarrhea.  Patient's been having vomiting and diarrhea since 7/13.  On 7/12 she had a burrito and thinks this caused it.  The vomiting is no longer present but she still having 7-8 episodes of diarrhea a day.  She started Imodium and Pepto-Bismol yesterday but despite this her diarrhea continues.  She has abdominal cramping before going to the bathroom but then it resolves.  No blood in the stool or fevers.  However she does think she had fevers at the beginning of the illness.  No recent travel or antibiotic use.  Anytime she eats or drinks it goes right through her.  Home Medications Prior to Admission medications   Medication Sig Start Date End Date Taking? Authorizing Provider  alendronate (FOSAMAX) 70 MG tablet Take 70 mg by mouth every 7 (seven) days. Takes on Sunday. Take with a full glass of water on an empty stomach.   Yes [provider]  aspirin EC 81 MG tablet Take 81 mg by mouth daily. Swallow whole.   Yes [provider]  atorvastatin (LIPITOR) 10 MG tablet Take 10 mg by mouth at bedtime. 07/23/22  Yes [provider]  Black Cohosh 540 MG CAPS Take 1 capsule by mouth daily.   Yes [provider]  [employer] CALCIUM 600+D 600-800 MG-UNIT TABS Take 1 tablet by mouth daily. 10/08/15  Yes [provider]  escitalopram (LEXAPRO) 10 MG tablet Take 10 mg by mouth daily.   Yes [provider]  HYDROcodone-acetaminophen (NORCO) 10-325 MG tablet Take 1 tablet by mouth every 6 (six) hours as needed.   Yes [provider]  magnesium oxide (MAGOX 400) 400 (241.3 Mg) MG tablet Take 1 tablet (400 mg total) by mouth daily.  06/20/20  Yes Vassie Loll, MD  naproxen (NAPROSYN) 500 MG tablet Take 500 mg by mouth daily as needed for mild pain. 09/05/19  Yes [provider]  ondansetron (ZOFRAN) 4 MG tablet Take 1 tablet (4 mg total) by mouth every 6 (six) hours. 10/13/22  Yes Gareth Eagle, PA-C  pantoprazole (PROTONIX) 40 MG tablet Take 1 tablet (40 mg total) by mouth 2 (two) times daily. 06/20/20  Yes Vassie Loll, MD  potassium chloride SA (KLOR-CON) 20 MEQ tablet Take 1 tablet (20 mEq total) by mouth daily. 06/21/20  Yes Vassie Loll, MD  pregabalin (LYRICA) 25 MG capsule Take 25 mg by mouth 3 (three) times daily as needed (leg pain). 09/06/22  Yes [provider]  sotalol (BETAPACE) 120 MG tablet Take 120 mg by mouth 2 (two) times daily.   Yes [provider]  topiramate (TOPAMAX) 50 MG tablet Take 50 mg by mouth daily.   Yes [provider]      Allergies    Bee venom    Review of Systems   Review of Systems  Gastrointestinal:  Positive for abdominal pain (before BMs, then gone) and diarrhea. Negative for blood in stool.    Physical Exam Updated Vital Signs BP 116/67 (BP Location: Left Arm)   Pulse (!) 54   Temp 97.7 F (36.5 C) (Oral)   Resp 19   Ht 5\' 2"  (1.575 m)  Wt 56.7 kg   SpO2 100%   BMI 22.86 kg/m  Physical Exam Vitals and nursing note reviewed.  Constitutional:      Appearance: She is well-developed.  HENT:     Head: Normocephalic and atraumatic.  Cardiovascular:     Rate and Rhythm: Normal rate and regular rhythm.     Heart sounds: Normal heart sounds.  Pulmonary:     Effort: Pulmonary effort is normal.     Breath sounds: Normal breath sounds.  Abdominal:     General: There is no distension.     Palpations: Abdomen is soft.     Tenderness: There is no abdominal tenderness.  Skin:    General: Skin is warm and dry.  Neurological:     Mental Status: She is alert.     ED Results / Procedures / Treatments   Labs (all labs ordered are  listed, but only abnormal results are displayed) Labs Reviewed  COMPREHENSIVE METABOLIC PANEL - Abnormal; Notable for the following components:      Result Value   Sodium 133 (*)    Potassium 2.7 (*)    CO2 19 (*)    Glucose, Bld 118 (*)    Creatinine, Ser 1.28 (*)    AST 45 (*)    GFR, Estimated 47 (*)    All other components within normal limits  URINALYSIS, ROUTINE W REFLEX MICROSCOPIC - Abnormal; Notable for the following components:   APPearance CLOUDY (*)    Specific Gravity, Urine 1.004 (*)    Hgb urine dipstick MODERATE (*)    Leukocytes,Ua LARGE (*)    Bacteria, UA RARE (*)    Non Squamous Epithelial 0-5 (*)    All other components within normal limits  CBC WITH DIFFERENTIAL/PLATELET - Abnormal; Notable for the following components:   RBC 3.83 (*)    Hemoglobin 11.7 (*)    HCT 34.2 (*)    Monocytes Absolute 1.4 (*)    All other components within normal limits  C DIFFICILE QUICK SCREEN W PCR REFLEX    GASTROINTESTINAL PANEL BY PCR, STOOL (REPLACES STOOL CULTURE)  LIPASE, BLOOD  MAGNESIUM  CBC WITH DIFFERENTIAL/PLATELET  HIV ANTIBODY (ROUTINE TESTING W REFLEX)    EKG EKG Interpretation Date/Time:  Wednesday October 16 2022 12:24:36 EDT Ventricular Rate:  56 PR Interval:  162 QRS Duration:  104 QT Interval:  525 QTC Calculation: 507 R Axis:   5  Text Interpretation: Sinus rhythm Ventricular premature complex Low voltage, precordial leads Nonspecific T abnormalities, diffuse leads Prolonged QT interval Confirmed by Pricilla Loveless [phone]) on 10/16/2022 12:28:44 PM  Radiology No results found.  Procedures .Critical Care  Performed by: Pricilla Loveless, MD Authorized by: Pricilla Loveless, MD   Critical care provider statement:    Critical care time (minutes):  35   Critical care time was exclusive of:  Separately billable procedures and treating other patients   Critical care was necessary to treat or prevent imminent or life-threatening deterioration of the  following conditions:  Metabolic crisis   Critical care was time spent personally by me on the following activities:  Development of treatment plan with patient or surrogate, discussions with consultants, evaluation of patient's response to treatment, examination of patient, ordering and review of laboratory studies, ordering and review of radiographic studies, ordering and performing treatments and interventions, pulse oximetry, re-evaluation of patient's condition and review of old charts     Medications Ordered in ED Medications  potassium chloride 10 mEq in 100 mL IVPB (10 mEq  Intravenous New Bag/Given 10/16/22 1614)  enoxaparin (LOVENOX) injection 40 mg (has no administration in time range)  0.9 %  sodium chloride infusion ( Intravenous New Bag/Given 10/16/22 1305)  acetaminophen (TYLENOL) tablet 650 mg (has no administration in time range)    Or  acetaminophen (TYLENOL) suppository 650 mg (has no administration in time range)  oxyCODONE (Oxy IR/ROXICODONE) immediate release tablet 5 mg (has no administration in time range)  fentaNYL (SUBLIMAZE) injection 12.5 mcg (has no administration in time range)  prochlorperazine (COMPAZINE) injection 10 mg (has no administration in time range)  traZODone (DESYREL) tablet 25 mg (has no administration in time range)  potassium chloride 10 MEQ/100ML IVPB (has no administration in time range)  lactated ringers bolus 1,000 mL (0 mLs Intravenous Stopped 10/16/22 1306)  potassium chloride SA (KLOR-CON M) CR tablet 40 mEq (40 mEq Oral Given 10/16/22 1304)    ED Course/ Medical Decision Making/ A&P                             Medical Decision Making Amount and/or Complexity of Data Reviewed Labs: ordered.    Details: Hypokalemia ECG/medicine tests: ordered and independent interpretation performed.    Details: Prolonged QT  Risk Decision regarding hospitalization.   Patient presents with continued diarrhea.  Will order C. difficile and GI pathogen  panel but she has been unable to go so far.  Otherwise, she has worsening hypokalemia and now has a prolonged QT on the EKG.  Given this I think she will need IV and oral supplementation as well as cardiac monitoring.  Will need admission.  Discussed with Dr. Laural Benes.  Her abdominal exam is benign and thus I do not think a CT would be helpful.        Final Clinical Impression(s) / ED Diagnoses Final diagnoses:  Hypokalemia    Rx / DC Orders ED Discharge Orders     None         Pricilla Loveless, MD 10/16/22 1651

## 2022-10-16 NOTE — ED Triage Notes (Signed)
Pt via POV c/o continued diarrhea ongoing since Saturday. She says she ate burritos Friday night and then symptoms started the following morning. Seen here recently for same.

## 2022-10-17 DIAGNOSIS — R197 Diarrhea, unspecified: Secondary | ICD-10-CM | POA: Diagnosis not present

## 2022-10-17 DIAGNOSIS — N179 Acute kidney failure, unspecified: Secondary | ICD-10-CM | POA: Diagnosis not present

## 2022-10-17 DIAGNOSIS — A020 Salmonella enteritis: Secondary | ICD-10-CM | POA: Diagnosis not present

## 2022-10-17 DIAGNOSIS — E871 Hypo-osmolality and hyponatremia: Secondary | ICD-10-CM | POA: Diagnosis not present

## 2022-10-17 DIAGNOSIS — E860 Dehydration: Secondary | ICD-10-CM | POA: Diagnosis not present

## 2022-10-17 DIAGNOSIS — D500 Iron deficiency anemia secondary to blood loss (chronic): Secondary | ICD-10-CM | POA: Diagnosis not present

## 2022-10-17 LAB — BASIC METABOLIC PANEL
Anion gap: 7 (ref 5–15)
BUN: 7 mg/dL — ABNORMAL LOW (ref 8–23)
CO2: 20 mmol/L — ABNORMAL LOW (ref 22–32)
Calcium: 8 mg/dL — ABNORMAL LOW (ref 8.9–10.3)
Chloride: 111 mmol/L (ref 98–111)
Creatinine, Ser: 0.89 mg/dL (ref 0.44–1.00)
GFR, Estimated: >60 mL/min (ref >60)
Glucose, Bld: 95 mg/dL (ref 70–99)
Potassium: 3.1 mmol/L — ABNORMAL LOW (ref 3.5–5.1)
Sodium: 138 mmol/L (ref 135–145)

## 2022-10-17 LAB — CBC WITH DIFFERENTIAL/PLATELET
Abs Immature Granulocytes: 0.08 10*3/uL — ABNORMAL HIGH (ref 0.00–0.07)
Basophils Absolute: 0 10*3/uL (ref 0.0–0.1)
Basophils Relative: 1 %
Eosinophils Absolute: 0 10*3/uL (ref 0.0–0.5)
Eosinophils Relative: 1 %
HCT: 29.9 % — ABNORMAL LOW (ref 36.0–46.0)
Hemoglobin: 10.1 g/dL — ABNORMAL LOW (ref 12.0–15.0)
Immature Granulocytes: 1 %
Lymphocytes Relative: 18 %
Lymphs Abs: 1.3 10*3/uL (ref 0.7–4.0)
MCH: 30.8 pg (ref 26.0–34.0)
MCHC: 33.8 g/dL (ref 30.0–36.0)
MCV: 91.2 fL (ref 80.0–100.0)
Monocytes Absolute: 1.2 10*3/uL — ABNORMAL HIGH (ref 0.1–1.0)
Monocytes Relative: 17 %
Neutro Abs: 4.5 10*3/uL (ref 1.7–7.7)
Neutrophils Relative %: 62 %
Platelets: 346 10*3/uL (ref 150–400)
RBC: 3.28 MIL/uL — ABNORMAL LOW (ref 3.87–5.11)
RDW: 13.5 % (ref 11.5–15.5)
WBC: 7.2 10*3/uL (ref 4.0–10.5)
nRBC: 0 % (ref 0.0–0.2)

## 2022-10-17 LAB — GASTROINTESTINAL PANEL BY PCR, STOOL (REPLACES STOOL CULTURE)

## 2022-10-17 LAB — HIV ANTIBODY (ROUTINE TESTING W REFLEX): HIV Screen 4th Generation wRfx: NONREACTIVE

## 2022-10-17 LAB — MAGNESIUM: Magnesium: 1.7 mg/dL (ref 1.7–2.4)

## 2022-10-17 MED ORDER — ASPIRIN 81 MG PO TBEC
81.0000 mg | DELAYED_RELEASE_TABLET | Freq: Every day | ORAL | Status: DC
  Administered 2022-10-17 – 2022-10-18 (×2): 81 mg via ORAL
  Filled 2022-10-17 (×2): qty 1

## 2022-10-17 MED ORDER — ESCITALOPRAM OXALATE 10 MG PO TABS
10.0000 mg | ORAL_TABLET | Freq: Every day | ORAL | Status: DC
  Administered 2022-10-17 – 2022-10-18 (×2): 10 mg via ORAL
  Filled 2022-10-17 (×2): qty 1

## 2022-10-17 MED ORDER — PANTOPRAZOLE SODIUM 40 MG PO TBEC
40.0000 mg | DELAYED_RELEASE_TABLET | Freq: Two times a day (BID) | ORAL | Status: DC
  Administered 2022-10-17 – 2022-10-18 (×3): 40 mg via ORAL
  Filled 2022-10-17 (×3): qty 1

## 2022-10-17 MED ORDER — SOTALOL HCL 120 MG PO TABS
60.0000 mg | ORAL_TABLET | Freq: Every day | ORAL | Status: DC
  Administered 2022-10-17 – 2022-10-18 (×2): 60 mg via ORAL
  Filled 2022-10-17 (×5): qty 0.5

## 2022-10-17 MED ORDER — HYDROCODONE-ACETAMINOPHEN 10-325 MG PO TABS
1.0000 | ORAL_TABLET | Freq: Four times a day (QID) | ORAL | Status: DC | PRN
  Administered 2022-10-17: 1 via ORAL
  Filled 2022-10-17: qty 1

## 2022-10-17 MED ORDER — MAGNESIUM SULFATE 4 GM/100ML IV SOLN
4.0000 g | Freq: Once | INTRAVENOUS | Status: AC
  Administered 2022-10-17: 4 g via INTRAVENOUS
  Filled 2022-10-17: qty 100

## 2022-10-17 MED ORDER — MAGNESIUM OXIDE -MG SUPPLEMENT 400 (240 MG) MG PO TABS
400.0000 mg | ORAL_TABLET | Freq: Every day | ORAL | Status: DC
  Administered 2022-10-17 – 2022-10-18 (×2): 400 mg via ORAL
  Filled 2022-10-17 (×2): qty 1

## 2022-10-17 MED ORDER — POTASSIUM CHLORIDE CRYS ER 20 MEQ PO TBCR
20.0000 meq | EXTENDED_RELEASE_TABLET | Freq: Every day | ORAL | Status: DC
  Administered 2022-10-17 – 2022-10-18 (×2): 20 meq via ORAL
  Filled 2022-10-17 (×2): qty 1

## 2022-10-17 MED ORDER — SOTALOL HCL 80 MG PO TABS
120.0000 mg | ORAL_TABLET | Freq: Two times a day (BID) | ORAL | Status: DC
  Filled 2022-10-17 (×3): qty 1.5

## 2022-10-17 MED ORDER — TOPIRAMATE 25 MG PO TABS
50.0000 mg | ORAL_TABLET | Freq: Every day | ORAL | Status: DC
  Administered 2022-10-17: 50 mg via ORAL
  Filled 2022-10-17: qty 2

## 2022-10-17 MED ORDER — ATORVASTATIN CALCIUM 10 MG PO TABS
10.0000 mg | ORAL_TABLET | Freq: Every day | ORAL | Status: DC
  Administered 2022-10-17: 10 mg via ORAL
  Filled 2022-10-17: qty 1

## 2022-10-17 MED ORDER — PREGABALIN 25 MG PO CAPS
25.0000 mg | ORAL_CAPSULE | Freq: Three times a day (TID) | ORAL | Status: DC | PRN
  Administered 2022-10-17: 25 mg via ORAL
  Filled 2022-10-17: qty 1

## 2022-10-17 MED ORDER — ZINC OXIDE 40 % EX OINT
TOPICAL_OINTMENT | Freq: Four times a day (QID) | CUTANEOUS | Status: DC
  Filled 2022-10-17: qty 57

## 2022-10-17 MED ORDER — POTASSIUM CHLORIDE CRYS ER 20 MEQ PO TBCR
60.0000 meq | EXTENDED_RELEASE_TABLET | Freq: Once | ORAL | Status: AC
  Administered 2022-10-17: 60 meq via ORAL
  Filled 2022-10-17: qty 3

## 2022-10-17 NOTE — Progress Notes (Signed)
PROGRESS NOTE   Alyssa Poole  ZOX:[id number] DOB: 08-Sep-1960 DOA: 10/16/2022 PCP: [facility]   Chief Complaint  Patient presents with   Diarrhea   Level of care: Telemetry  Brief Admission History:  62 y/o female with history of paroxysmal atrial fibrillation with aberrancy (not on anticoagulation due to history of SAH), GERD, osteoporosis, HTN, chronic headaches, former smoker, Depression/Anxiety, history of carotid stenting presented to ED for second time in last several days with complaints of ongoing diarrheal illness, malaise. She had been seen on 7/14 with acute diarrhea that started after eating burritos and then was having 15-20 diarrheal stools per day, she was hypokalemic, she was treated supportively and then discharged home.  She returns today with persistent diarrhea of about 7-8 times per day. No blood seen. She has been using imodium and pepto-bismol with no relief or improvement. She is reporting that everytime she is eating or drinking it goes through her system rapidly.  She denies any recent travel and no recent use of antibiotics.  Her labs today reveal acute kidney injury and hypokalemia and hyponatremia.  She is being admitted for further management for a diarrheal illness that has failed outpatient management.     Assessment and Plan:  Salmonella gastroenteritis  - pt remains symptomatic at this time - continue IV antibiotics as ordered - continue supportive measures - anticipate DC home in next 24 hours if improves - slowly advancing diet as tolerated   Hypokalemia - from ongoing GI losses - giving additional IV magnesium  - recheck in AM   Prolonged QT - resolved  - repeated EKG done this morning shows normal QT  Normocytic anemia  - repeated Hg this morning was 10 after hydration  Hyponatremia - from dehydration and GI losses - improved with IV fluid hydration and resolved now   DVT prophylaxis: enoxparin  Code Status: full  Family  Communication: family members at bedside  Disposition: anticipate DC home in AM   Consultants:   Procedures:   Antimicrobials:  Ceftriaxone   Subjective: Patient reporting that diarrhea is slowing down but still persistent.  No fever or chills.  No blood in stool.  Objective: Vitals:   10/16/22 2107 10/16/22 2331 10/17/22 0404 10/17/22 0500  BP: (!) 127/49 (!) 118/51 132/63   Pulse: (!) 59 (!) 59 68   Resp: 20 14 16    Temp: 98.2 F (36.8 C) 98.5 F (36.9 C) 99 F (37.2 C)   TempSrc:      SpO2: 100% 100% 98%   Weight:    56.9 kg  Height:        Intake/Output Summary (Last 24 hours) at 10/17/2022 1321 Last data filed at 10/17/2022 1159 Gross per 24 hour  Intake 2644.12 ml  Output --  Net 2644.12 ml   Filed Weights   10/16/22 1110 10/17/22 0500  Weight: 56.7 kg 56.9 kg   Examination:  General exam: Appears calm and comfortable  Respiratory system: Clear to auscultation. Respiratory effort normal. Cardiovascular system: normal S1 & S2 heard. No JVD, murmurs, rubs, gallops or clicks. No pedal edema. Gastrointestinal system: Abdomen is nondistended, soft and nontender. No organomegaly or masses felt. Normal bowel sounds heard. Central nervous system: Alert and oriented. No focal neurological deficits. Extremities: Symmetric 5 x 5 power. Skin: No rashes, lesions or ulcers. Psychiatry: Judgement and insight appear normal. Mood & affect appropriate.   Data Reviewed: I have personally reviewed following labs and imaging studies  CBC: Recent Labs  Lab 10/13/22  1205 10/16/22 1214 10/17/22 0425  WBC 10.3 7.3 7.2  NEUTROABS  --  4.9 4.5  HGB 10.7* 11.7* 10.1*  HCT 31.3* 34.2* 29.9*  MCV 93.7 89.3 91.2  PLT 245 350 346    Basic Metabolic Panel: Recent Labs  Lab 10/13/22 1205 10/16/22 1111 10/16/22 1133 10/17/22 0425  NA 129* 133*  --  138  K 3.1* 2.7*  --  3.1*  CL 99 105  --  111  CO2 21* 19*  --  20*  GLUCOSE 117* 118*  --  95  BUN 12 15  --  7*   CREATININE 0.93 1.28*  --  0.89  CALCIUM 8.3* 9.2  --  8.0*  MG  --   --  2.0 1.7    CBG: No results for input(s): "GLUCAP" in the last 168 hours.  Recent Results (from the past 240 hour(s))  C Difficile Quick Screen w PCR reflex     Status: None   Collection Time: 10/16/22  2:12 PM   Specimen: STOOL  Result Value Ref Range Status   C Diff antigen NEGATIVE NEGATIVE Final   C Diff toxin NEGATIVE NEGATIVE Final   C Diff interpretation No C. difficile detected.  Final    Comment: Performed at [facility], [address] [place], [place] [address]  Gastrointestinal Panel by PCR , Stool     Status: Abnormal   Collection Time: 10/16/22  2:12 PM   Specimen: STOOL  Result Value Ref Range Status   Campylobacter species NOT DETECTED NOT DETECTED Final   Plesimonas shigelloides NOT DETECTED NOT DETECTED Final   Salmonella species DETECTED (A) NOT DETECTED Final    Comment: RESULT CALLED TO, READ BACK BY AND VERIFIED WITH: SHENELLE BLACKWELL 10/17/22 1026 MW    Yersinia enterocolitica NOT DETECTED NOT DETECTED Final   Vibrio species NOT DETECTED NOT DETECTED Final   Vibrio cholerae NOT DETECTED NOT DETECTED Final   Enteroaggregative E coli (EAEC) NOT DETECTED NOT DETECTED Final   Enteropathogenic E coli (EPEC) NOT DETECTED NOT DETECTED Final   Enterotoxigenic E coli (ETEC) NOT DETECTED NOT DETECTED Final   Shiga like toxin producing E coli (STEC) NOT DETECTED NOT DETECTED Final   Shigella/Enteroinvasive E coli (EIEC) NOT DETECTED NOT DETECTED Final   Cryptosporidium NOT DETECTED NOT DETECTED Final   Cyclospora cayetanensis NOT DETECTED NOT DETECTED Final   Entamoeba histolytica NOT DETECTED NOT DETECTED Final   Giardia lamblia NOT DETECTED NOT DETECTED Final   Adenovirus F40/41 NOT DETECTED NOT DETECTED Final   Astrovirus NOT DETECTED NOT DETECTED Final   Norovirus GI/GII NOT DETECTED NOT DETECTED Final   Rotavirus A NOT DETECTED NOT DETECTED Final   Sapovirus (I, II, IV, and V) NOT  DETECTED NOT DETECTED Final    Comment: Performed at [facility], [address], [place], [place] [address]     Radiology Studies: No results found.  Scheduled Meds:  aspirin EC  81 mg Oral Daily   atorvastatin  10 mg Oral QHS   enoxaparin (LOVENOX) injection  40 mg Subcutaneous Q24H   escitalopram  10 mg Oral Daily   loperamide  2 mg Oral TID with meals   magnesium oxide  400 mg Oral Daily   pantoprazole  40 mg Oral BID   potassium chloride SA  20 mEq Oral Daily   rifaximin  200 mg Oral TID   sotalol  60 mg Oral Daily   topiramate  50 mg Oral Daily   Continuous Infusions:  sodium chloride 100 mL/hr at 10/17/22 0626   cefTRIAXone (ROCEPHIN)  IV 2 g (10/16/22 1902)    LOS: 0 days   Time spent: 38 mins  Sofi Bryars Laural Benes, MD How to contact the [facility] Attending or Consulting provider 7A - 7P or covering provider during after hours 7P -7A, for this patient?  Check the care team in [facility] and look for a) attending/consulting [facility] provider listed and b) the [facility] team listed Log into [email or web address] and use [place]'s universal password to access. If you do not have the password, please contact the hospital operator. Locate the [facility] provider you are looking for under [facility] and page to a number that you can be directly reached. If you still have difficulty reaching the provider, please page the [facility] (Director on Call) for the Hospitalists listed on amion for assistance.  10/17/2022, 1:21 PM

## 2022-10-17 NOTE — Progress Notes (Signed)
   10/17/22 1224  TOC Brief Assessment  Insurance and Status Reviewed  Patient has primary care physician Yes  Home environment has been reviewed with spouse  Prior level of function: independent  Prior/Current Home Services No current home services  Social Determinants of Health Reivew SDOH reviewed no interventions necessary  Readmission risk has been reviewed Yes  Transition of care needs no transition of care needs at this time

## 2022-10-17 NOTE — Plan of Care (Signed)
  Problem: Education: Goal: Knowledge of General Education information will improve Description Including pain rating scale, medication(s)/side effects and non-pharmacologic comfort measures Outcome: Progressing   

## 2022-10-17 NOTE — Care Management Obs Status (Signed)
MEDICARE OBSERVATION STATUS NOTIFICATION   Patient Details  Name: Alyssa Poole MRN: [id number] Date of Birth: 21-Feb-1961   Medicare Observation Status Notification Given:  Yes    Corey Harold 10/17/2022, 4:11 PM

## 2022-10-18 DIAGNOSIS — E860 Dehydration: Secondary | ICD-10-CM | POA: Diagnosis not present

## 2022-10-18 DIAGNOSIS — A020 Salmonella enteritis: Secondary | ICD-10-CM | POA: Diagnosis not present

## 2022-10-18 DIAGNOSIS — E871 Hypo-osmolality and hyponatremia: Secondary | ICD-10-CM | POA: Diagnosis not present

## 2022-10-18 DIAGNOSIS — R197 Diarrhea, unspecified: Secondary | ICD-10-CM | POA: Diagnosis not present

## 2022-10-18 LAB — CBC WITH DIFFERENTIAL/PLATELET
Abs Immature Granulocytes: 0.2 10*3/uL — ABNORMAL HIGH (ref 0.00–0.07)
Basophils Absolute: 0 10*3/uL (ref 0.0–0.1)
Basophils Relative: 0 %
Eosinophils Absolute: 0 10*3/uL (ref 0.0–0.5)
Eosinophils Relative: 0 %
HCT: 27.4 % — ABNORMAL LOW (ref 36.0–46.0)
Hemoglobin: 9.1 g/dL — ABNORMAL LOW (ref 12.0–15.0)
Lymphocytes Relative: 27 %
Lymphs Abs: 1.9 10*3/uL (ref 0.7–4.0)
MCH: 31 pg (ref 26.0–34.0)
MCHC: 33.2 g/dL (ref 30.0–36.0)
MCV: 93.2 fL (ref 80.0–100.0)
Metamyelocytes Relative: 2 %
Monocytes Absolute: 0.7 10*3/uL (ref 0.1–1.0)
Monocytes Relative: 10 %
Myelocytes: 1 %
Neutro Abs: 4.2 10*3/uL (ref 1.7–7.7)
Neutrophils Relative %: 60 %
Platelets: 327 10*3/uL (ref 150–400)
RBC: 2.94 MIL/uL — ABNORMAL LOW (ref 3.87–5.11)
RDW: 13.7 % (ref 11.5–15.5)
WBC: 7 10*3/uL (ref 4.0–10.5)
nRBC: 0 % (ref 0.0–0.2)

## 2022-10-18 LAB — BASIC METABOLIC PANEL
Anion gap: 6 (ref 5–15)
BUN: <5 mg/dL — ABNORMAL LOW (ref 8–23)
CO2: 21 mmol/L — ABNORMAL LOW (ref 22–32)
Calcium: 7.9 mg/dL — ABNORMAL LOW (ref 8.9–10.3)
Chloride: 111 mmol/L (ref 98–111)
Creatinine, Ser: 0.69 mg/dL (ref 0.44–1.00)
GFR, Estimated: >60 mL/min (ref >60)
Glucose, Bld: 92 mg/dL (ref 70–99)
Potassium: 3.1 mmol/L — ABNORMAL LOW (ref 3.5–5.1)
Sodium: 138 mmol/L (ref 135–145)

## 2022-10-18 LAB — MAGNESIUM: Magnesium: 2.3 mg/dL (ref 1.7–2.4)

## 2022-10-18 MED ORDER — DIPHENHYDRAMINE HCL 25 MG PO CAPS
ORAL_CAPSULE | ORAL | Status: AC
  Filled 2022-10-18: qty 1

## 2022-10-18 MED ORDER — MIDODRINE HCL 5 MG PO TABS
ORAL_TABLET | ORAL | Status: AC
  Filled 2022-10-18: qty 2

## 2022-10-18 MED ORDER — CEFDINIR 300 MG PO CAPS: 300.0000 mg | ORAL_CAPSULE | Freq: Two times a day (BID) | ORAL | 0 refills | Status: AC

## 2022-10-18 MED ORDER — PANTOPRAZOLE SODIUM 40 MG PO TBEC
DELAYED_RELEASE_TABLET | ORAL | Status: AC
  Filled 2022-10-18: qty 1

## 2022-10-18 MED ORDER — SOTALOL HCL 120 MG PO TABS: 60.0000 mg | ORAL_TABLET | Freq: Every day | ORAL | Status: AC

## 2022-10-18 NOTE — Discharge Instructions (Signed)
IMPORTANT INFORMATION: PAY CLOSE ATTENTION   PHYSICIAN DISCHARGE INSTRUCTIONS  Follow with Primary care provider  [facility]  and other consultants as instructed by your Hospitalist Physician  SEEK MEDICAL CARE OR RETURN TO EMERGENCY ROOM IF SYMPTOMS COME BACK, WORSEN OR NEW PROBLEM DEVELOPS   Please note: You were cared for by a hospitalist during your hospital stay. Every effort will be made to forward records to your primary care provider.  You can request that your primary care provider send for your hospital records if they have not received them.  Once you are discharged, your primary care physician will handle any further medical issues. Please note that NO REFILLS for any discharge medications will be authorized once you are discharged, as it is imperative that you return to your primary care physician (or establish a relationship with a primary care physician if you do not have one) for your post hospital discharge needs so that they can reassess your need for medications and monitor your lab values.  Please get a complete blood count and chemistry panel checked by your Primary MD at your next visit, and again as instructed by your Primary MD.  Get Medicines reviewed and adjusted: Please take all your medications with you for your next visit with your Primary MD  Laboratory/radiological data: Please request your Primary MD to go over all hospital tests and procedure/radiological results at the follow up, please ask your primary care provider to get all Hospital records sent to his/her office.  In some cases, they will be blood work, cultures and biopsy results pending at the time of your discharge. Please request that your primary care provider follow up on these results.  If you are diabetic, please bring your blood sugar readings with you to your follow up appointment with primary care.    Please call and make your follow up appointments as soon as possible.    Also  Note the following: If you experience worsening of your admission symptoms, develop shortness of breath, life threatening emergency, suicidal or homicidal thoughts you must seek medical attention immediately by calling 911 or calling your MD immediately  if symptoms less severe.  You must read complete instructions/literature along with all the possible adverse reactions/side effects for all the Medicines you take and that have been prescribed to you. Take any new Medicines after you have completely understood and accpet all the possible adverse reactions/side effects.   Do not drive when taking Pain medications or sleeping medications (Benzodiazepines)  Do not take more than prescribed Pain, Sleep and Anxiety Medications. It is not advisable to combine anxiety,sleep and pain medications without talking with your primary care practitioner  Special Instructions: If you have smoked or chewed Tobacco  in the last 2 yrs please stop smoking, stop any regular Alcohol  and or any Recreational drug use.  Wear Seat belts while driving.  Do not drive if taking any narcotic, mind altering or controlled substances or recreational drugs or alcohol.       

## 2022-10-18 NOTE — Plan of Care (Signed)
  Problem: Education: Goal: Knowledge of General Education information will improve Description: Including pain rating scale, medication(s)/side effects and non-pharmacologic comfort measures Outcome: Not Progressing   Problem: Health Behavior/Discharge Planning: Goal: Ability to manage health-related needs will improve Outcome: Not Progressing   

## 2022-10-18 NOTE — Discharge Summary (Signed)
Physician Discharge Summary  Alyssa Poole ZOX:[id number] DOB: [birth year] DOA: 10/16/2022  PCP: [facility]  Admit date: 10/16/2022 Discharge date: 10/18/2022  Admitted From:  Home  Disposition: Home   Recommendations for Outpatient Follow-up:  Follow up with PCP in 1 weeks   Discharge Condition: STABLE   CODE STATUS: FULL DIET: soft foods, advance as tolerated    Brief Hospitalization Summary: Please see all hospital notes, images, labs for full details of the hospitalization. 62 y/o female with history of paroxysmal atrial fibrillation with aberrancy (not on anticoagulation due to history of SAH), GERD, osteoporosis, HTN, chronic headaches, former smoker, Depression/Anxiety, history of carotid stenting presented to ED for second time in last several days with complaints of ongoing diarrheal illness, malaise. She had been seen on 7/14 with acute diarrhea that started after eating burritos and then was having 15-20 diarrheal stools per day, she was hypokalemic, she was treated supportively and then discharged home.  She returns today with persistent diarrhea of about 7-8 times per day. No blood seen. She has been using imodium and pepto-bismol with no relief or improvement. She is reporting that everytime she is eating or drinking it goes through her system rapidly.  She denies any recent travel and no recent use of antibiotics.  Her labs today reveal acute kidney injury and hypokalemia and hyponatremia.  She is being admitted for further management for a diarrheal illness that has failed outpatient management.    Hospital Course  Pt was admitted with a severe case of salmonella gastroenteritis and treated with IV fluids and IV antibiotics and supportive measures.  She responded well to therapy and diet has been advanced and she is requesting to go home.  She is being discharged home in stable condition.  Close outpatient follow up with PCP advised.   Discharge Diagnoses:   Principal Problem:   Salmonella enteritis Active Problems:   Normocytic anemia   Cognitive deficit, post-stroke   H/O vertebral artery stenosis   Hyponatremia   Impaired mobility and ADLs   Hypokalemia   Acute renal failure (ARF) (HCC)   Dehydration   Diarrhea   Discharge Instructions:  Allergies as of 10/18/2022       Reactions   Bee Venom Swelling   Causes bad swelling. No epipen  needed        Medication List     STOP taking these medications    naproxen 500 MG tablet Commonly known as: NAPROSYN       TAKE these medications    alendronate 70 MG tablet Commonly known as: FOSAMAX Take 70 mg by mouth every 7 (seven) days. Takes on Sunday. Take with a full glass of water on an empty stomach.   aspirin EC 81 MG tablet Take 81 mg by mouth daily. Swallow whole.   atorvastatin 10 MG tablet Commonly known as: LIPITOR Take 10 mg by mouth at bedtime.   Black Cohosh 540 MG Caps Take 1 capsule by mouth daily.   cefdinir 300 MG capsule Commonly known as: OMNICEF Take 1 capsule (300 mg total) by mouth 2 (two) times daily for 2 days.   [facility] Calcium 600+D 600-800 MG-UNIT Tabs Generic drug: Calcium Carb-Cholecalciferol Take 1 tablet by mouth daily.   escitalopram 10 MG tablet Commonly known as: LEXAPRO Take 10 mg by mouth daily.   HYDROcodone-acetaminophen 10-325 MG tablet Commonly known as: NORCO Take 1 tablet by mouth every 6 (six) hours as needed.   magnesium oxide 400 (241.3 Mg) MG tablet Commonly  known as: MagOx 400 Take 1 tablet (400 mg total) by mouth daily.   ondansetron 4 MG tablet Commonly known as: ZOFRAN Take 1 tablet (4 mg total) by mouth every 6 (six) hours.   pantoprazole 40 MG tablet Commonly known as: PROTONIX Take 1 tablet (40 mg total) by mouth 2 (two) times daily.   potassium chloride SA 20 MEQ tablet Commonly known as: KLOR-CON M Take 1 tablet (20 mEq total) by mouth daily.   pregabalin 25 MG capsule Commonly known as:  LYRICA Take 25 mg by mouth 3 (three) times daily as needed (leg pain).   sotalol 120 MG tablet Commonly known as: BETAPACE Take 0.5 tablets (60 mg total) by mouth daily. What changed:  how much to take when to take this   topiramate 50 MG tablet Commonly known as: TOPAMAX Take 50 mg by mouth daily.        Follow-up Information     [facility] Schedule an appointment as soon as possible for a visit in 1 week(s).   Why: Hospital Follow Up Contact information: [address] [place] [address] [phone]                Allergies  Allergen Reactions   Bee Venom Swelling    Causes bad swelling. No epipen  needed   Allergies as of 10/18/2022       Reactions   Bee Venom Swelling   Causes bad swelling. No epipen  needed        Medication List     STOP taking these medications    naproxen 500 MG tablet Commonly known as: NAPROSYN       TAKE these medications    alendronate 70 MG tablet Commonly known as: FOSAMAX Take 70 mg by mouth every 7 (seven) days. Takes on Sunday. Take with a full glass of water on an empty stomach.   aspirin EC 81 MG tablet Take 81 mg by mouth daily. Swallow whole.   atorvastatin 10 MG tablet Commonly known as: LIPITOR Take 10 mg by mouth at bedtime.   Black Cohosh 540 MG Caps Take 1 capsule by mouth daily.   cefdinir 300 MG capsule Commonly known as: OMNICEF Take 1 capsule (300 mg total) by mouth 2 (two) times daily for 2 days.   [facility] Calcium 600+D 600-800 MG-UNIT Tabs Generic drug: Calcium Carb-Cholecalciferol Take 1 tablet by mouth daily.   escitalopram 10 MG tablet Commonly known as: LEXAPRO Take 10 mg by mouth daily.   HYDROcodone-acetaminophen 10-325 MG tablet Commonly known as: NORCO Take 1 tablet by mouth every 6 (six) hours as needed.   magnesium oxide 400 (241.3 Mg) MG tablet Commonly known as: MagOx 400 Take 1 tablet (400 mg total) by mouth daily.   ondansetron 4 MG  tablet Commonly known as: ZOFRAN Take 1 tablet (4 mg total) by mouth every 6 (six) hours.   pantoprazole 40 MG tablet Commonly known as: PROTONIX Take 1 tablet (40 mg total) by mouth 2 (two) times daily.   potassium chloride SA 20 MEQ tablet Commonly known as: KLOR-CON M Take 1 tablet (20 mEq total) by mouth daily.   pregabalin 25 MG capsule Commonly known as: LYRICA Take 25 mg by mouth 3 (three) times daily as needed (leg pain).   sotalol 120 MG tablet Commonly known as: BETAPACE Take 0.5 tablets (60 mg total) by mouth daily. What changed:  how much to take when to take this   topiramate 50 MG tablet Commonly  known as: TOPAMAX Take 50 mg by mouth daily.        Procedures/Studies: No results found.   Subjective: Pt reports that she is feeling much better and ready to go home, diarrhea resolved  Discharge Exam: Vitals:   10/17/22 2054 10/18/22 0448  BP: 125/67 132/68  Pulse: (!) 48 60  Resp: 18 16  Temp: 98.9 F (37.2 C) 98.6 F (37 C)  SpO2: 99% 99%   Vitals:   10/17/22 0500 10/17/22 1944 10/17/22 2054 10/18/22 0448  BP:  (!) 145/74 125/67 132/68  Pulse:  62 (!) 48 60  Resp:   18 16  Temp:   98.9 F (37.2 C) 98.6 F (37 C)  TempSrc:    Oral  SpO2:   99% 99%  Weight: 56.9 kg   57.2 kg  Height:       General: Pt is alert, awake, not in acute distress Cardiovascular: RRR, S1/S2 +, no rubs, no gallops Respiratory: CTA bilaterally, no wheezing, no rhonchi Abdominal: Soft, NT, ND, bowel sounds + Extremities: no edema, no cyanosis   The results of significant diagnostics from this hospitalization (including imaging, microbiology, ancillary and laboratory) are listed below for reference.     Microbiology: Recent Results (from the past 240 hour(s))  C Difficile Quick Screen w PCR reflex     Status: None   Collection Time: 10/16/22  2:12 PM   Specimen: STOOL  Result Value Ref Range Status   C Diff antigen NEGATIVE NEGATIVE Final   C Diff toxin  NEGATIVE NEGATIVE Final   C Diff interpretation No C. difficile detected.  Final    Comment: Performed at [facility], [address] [place], [place] [address]  Gastrointestinal Panel by PCR , Stool     Status: Abnormal   Collection Time: 10/16/22  2:12 PM   Specimen: STOOL  Result Value Ref Range Status   Campylobacter species NOT DETECTED NOT DETECTED Final   Plesimonas shigelloides NOT DETECTED NOT DETECTED Final   Salmonella species DETECTED (A) NOT DETECTED Final    Comment: RESULT CALLED TO, READ BACK BY AND VERIFIED WITH: SHENELLE BLACKWELL 10/17/22 1026 MW    Yersinia enterocolitica NOT DETECTED NOT DETECTED Final   Vibrio species NOT DETECTED NOT DETECTED Final   Vibrio cholerae NOT DETECTED NOT DETECTED Final   Enteroaggregative E coli (EAEC) NOT DETECTED NOT DETECTED Final   Enteropathogenic E coli (EPEC) NOT DETECTED NOT DETECTED Final   Enterotoxigenic E coli (ETEC) NOT DETECTED NOT DETECTED Final   Shiga like toxin producing E coli (STEC) NOT DETECTED NOT DETECTED Final   Shigella/Enteroinvasive E coli (EIEC) NOT DETECTED NOT DETECTED Final   Cryptosporidium NOT DETECTED NOT DETECTED Final   Cyclospora cayetanensis NOT DETECTED NOT DETECTED Final   Entamoeba histolytica NOT DETECTED NOT DETECTED Final   Giardia lamblia NOT DETECTED NOT DETECTED Final   Adenovirus F40/41 NOT DETECTED NOT DETECTED Final   Astrovirus NOT DETECTED NOT DETECTED Final   Norovirus GI/GII NOT DETECTED NOT DETECTED Final   Rotavirus A NOT DETECTED NOT DETECTED Final   Sapovirus (I, II, IV, and V) NOT DETECTED NOT DETECTED Final    Comment: Performed at [facility], [address] [place], [place] [address]     Labs: BNP (last 3 results) No results for input(s): "BNP" in the last 8760 hours. Basic Metabolic Panel: Recent Labs  Lab 10/13/22 1205 10/16/22 1111 10/16/22 1133 10/17/22 0425 10/18/22 0508  NA 129* 133*  --  138 138  K  3.1* 2.7*  --  3.1* 3.1*  CL 99 105  --   111 111  CO2 21* 19*  --  20* 21*  GLUCOSE 117* 118*  --  95 92  BUN 12 15  --  7* <5*  CREATININE 0.93 1.28*  --  0.89 0.69  CALCIUM 8.3* 9.2  --  8.0* 7.9*  MG  --   --  2.0 1.7 2.3   Liver Function Tests: Recent Labs  Lab 10/13/22 1205 10/16/22 1111  AST 30 45*  ALT 20 38  ALKPHOS 45 53  BILITOT 0.5 0.3  PROT 7.1 8.0  ALBUMIN 3.6 3.7   Recent Labs  Lab 10/13/22 1205 10/16/22 1111  LIPASE 23 27   No results for input(s): "AMMONIA" in the last 168 hours. CBC: Recent Labs  Lab 10/13/22 1205 10/16/22 1214 10/17/22 0425 10/18/22 0508  WBC 10.3 7.3 7.2 7.0  NEUTROABS  --  4.9 4.5 4.2  HGB 10.7* 11.7* 10.1* 9.1*  HCT 31.3* 34.2* 29.9* 27.4*  MCV 93.7 89.3 91.2 93.2  PLT 245 350 346 327   Cardiac Enzymes: No results for input(s): "CKTOTAL", "CKMB", "CKMBINDEX", "TROPONINI" in the last 168 hours. BNP: Invalid input(s): "POCBNP" CBG: No results for input(s): "GLUCAP" in the last 168 hours. D-Dimer No results for input(s): "DDIMER" in the last 72 hours. Hgb A1c No results for input(s): "HGBA1C" in the last 72 hours. Lipid Profile No results for input(s): "CHOL", "HDL", "LDLCALC", "TRIG", "CHOLHDL", "LDLDIRECT" in the last 72 hours. Thyroid function studies No results for input(s): "TSH", "T4TOTAL", "T3FREE", "THYROIDAB" in the last 72 hours.  Invalid input(s): "FREET3" Anemia work up No results for input(s): "VITAMINB12", "FOLATE", "FERRITIN", "TIBC", "IRON", "RETICCTPCT" in the last 72 hours. Urinalysis    Component Value Date/Time   COLORURINE YELLOW 10/16/2022 1420   APPEARANCEUR CLOUDY (A) 10/16/2022 1420   LABSPEC 1.004 (L) 10/16/2022 1420   PHURINE 6.0 10/16/2022 1420   GLUCOSEU NEGATIVE 10/16/2022 1420   HGBUR MODERATE (A) 10/16/2022 1420   BILIRUBINUR NEGATIVE 10/16/2022 1420   KETONESUR NEGATIVE 10/16/2022 1420   PROTEINUR NEGATIVE 10/16/2022 1420   UROBILINOGEN 0.2 01/15/2011 1251   NITRITE NEGATIVE 10/16/2022 1420   LEUKOCYTESUR LARGE (A)  10/16/2022 1420   Sepsis Labs Recent Labs  Lab 10/13/22 1205 10/16/22 1214 10/17/22 0425 10/18/22 0508  WBC 10.3 7.3 7.2 7.0   Microbiology Recent Results (from the past 240 hour(s))  C Difficile Quick Screen w PCR reflex     Status: None   Collection Time: 10/16/22  2:12 PM   Specimen: STOOL  Result Value Ref Range Status   C Diff antigen NEGATIVE NEGATIVE Final   C Diff toxin NEGATIVE NEGATIVE Final   C Diff interpretation No C. difficile detected.  Final    Comment: Performed at [facility], [address], [place], [place] [address]  Gastrointestinal Panel by PCR , Stool     Status: Abnormal   Collection Time: 10/16/22  2:12 PM   Specimen: STOOL  Result Value Ref Range Status   Campylobacter species NOT DETECTED NOT DETECTED Final   Plesimonas shigelloides NOT DETECTED NOT DETECTED Final   Salmonella species DETECTED (A) NOT DETECTED Final    Comment: RESULT CALLED TO, READ BACK BY AND VERIFIED WITH: SHENELLE BLACKWELL 10/17/22 1026 MW    Yersinia enterocolitica NOT DETECTED NOT DETECTED Final   Vibrio species NOT DETECTED NOT DETECTED Final   Vibrio cholerae NOT DETECTED NOT DETECTED Final   Enteroaggregative E coli (EAEC) NOT DETECTED NOT  DETECTED Final   Enteropathogenic E coli (EPEC) NOT DETECTED NOT DETECTED Final   Enterotoxigenic E coli (ETEC) NOT DETECTED NOT DETECTED Final   Shiga like toxin producing E coli (STEC) NOT DETECTED NOT DETECTED Final   Shigella/Enteroinvasive E coli (EIEC) NOT DETECTED NOT DETECTED Final   Cryptosporidium NOT DETECTED NOT DETECTED Final   Cyclospora cayetanensis NOT DETECTED NOT DETECTED Final   Entamoeba histolytica NOT DETECTED NOT DETECTED Final   Giardia lamblia NOT DETECTED NOT DETECTED Final   Adenovirus F40/41 NOT DETECTED NOT DETECTED Final   Astrovirus NOT DETECTED NOT DETECTED Final   Norovirus GI/GII NOT DETECTED NOT DETECTED Final   Rotavirus A NOT DETECTED NOT DETECTED Final   Sapovirus (I, II, IV, and V) NOT  DETECTED NOT DETECTED Final    Comment: Performed at [facility], [address], [place], [place] [address]    Time coordinating discharge: 36 mins  SIGNED:  Standley Dakins, MD  [facility] 10/18/2022, 9:26 AM How to contact the [facility] Attending or Consulting provider 7A - 7P or covering provider during after hours 7P -7A, for this patient?  Check the care team in [facility] and look for a) attending/consulting [facility] provider listed and b) the [facility] team listed Log into [email or web address] and use [place]'s universal password to access. If you do not have the password, please contact the hospital operator. Locate the [facility] provider you are looking for under [facility] and page to a number that you can be directly reached. If you still have difficulty reaching the provider, please page the [facility] (Director on Call) for the Hospitalists listed on amion for assistance.

## 2022-12-06 ENCOUNTER — Other Ambulatory Visit (HOSPITAL_COMMUNITY): Payer: Self-pay | Admitting: Internal Medicine

## 2022-12-06 DIAGNOSIS — Z780 Asymptomatic menopausal state: Secondary | ICD-10-CM

## 2023-01-06 ENCOUNTER — Encounter (HOSPITAL_COMMUNITY): Payer: Self-pay | Admitting: Internal Medicine

## 2023-01-06 ENCOUNTER — Other Ambulatory Visit (HOSPITAL_COMMUNITY): Payer: Self-pay | Admitting: Internal Medicine

## 2023-01-06 DIAGNOSIS — Z780 Asymptomatic menopausal state: Secondary | ICD-10-CM

## 2023-01-23 ENCOUNTER — Inpatient Hospital Stay: Payer: Medicare HMO

## 2023-01-23 ENCOUNTER — Inpatient Hospital Stay: Payer: Medicare HMO | Attending: Oncology | Admitting: Oncology

## 2023-01-23 ENCOUNTER — Encounter: Payer: Self-pay | Admitting: Oncology

## 2023-01-23 VITALS — BP 129/57 | HR 56 | Temp 97.8°F | Resp 18 | Ht 61.0 in | Wt 130.8 lb

## 2023-01-23 DIAGNOSIS — D649 Anemia, unspecified: Secondary | ICD-10-CM | POA: Diagnosis present

## 2023-01-23 DIAGNOSIS — R5383 Other fatigue: Secondary | ICD-10-CM | POA: Insufficient documentation

## 2023-01-23 DIAGNOSIS — Z8673 Personal history of transient ischemic attack (TIA), and cerebral infarction without residual deficits: Secondary | ICD-10-CM | POA: Insufficient documentation

## 2023-01-23 DIAGNOSIS — Z87891 Personal history of nicotine dependence: Secondary | ICD-10-CM | POA: Diagnosis not present

## 2023-01-23 DIAGNOSIS — N819 Female genital prolapse, unspecified: Secondary | ICD-10-CM | POA: Diagnosis not present

## 2023-01-23 LAB — CBC WITH DIFFERENTIAL/PLATELET
Abs Immature Granulocytes: 0.03 10*3/uL (ref 0.00–0.07)
Basophils Absolute: 0.1 10*3/uL (ref 0.0–0.1)
Basophils Relative: 1 %
Eosinophils Absolute: 0.9 10*3/uL — ABNORMAL HIGH (ref 0.0–0.5)
Eosinophils Relative: 10 %
HCT: 33 % — ABNORMAL LOW (ref 36.0–46.0)
Hemoglobin: 10.4 g/dL — ABNORMAL LOW (ref 12.0–15.0)
Immature Granulocytes: 0 %
Lymphocytes Relative: 23 %
Lymphs Abs: 1.9 10*3/uL (ref 0.7–4.0)
MCH: 31.1 pg (ref 26.0–34.0)
MCHC: 31.5 g/dL (ref 30.0–36.0)
MCV: 98.8 fL (ref 80.0–100.0)
Monocytes Absolute: 0.9 10*3/uL (ref 0.1–1.0)
Monocytes Relative: 10 %
Neutro Abs: 4.7 10*3/uL (ref 1.7–7.7)
Neutrophils Relative %: 56 %
Platelets: 348 10*3/uL (ref 150–400)
RBC: 3.34 MIL/uL — ABNORMAL LOW (ref 3.87–5.11)
RDW: 13.2 % (ref 11.5–15.5)
WBC: 8.4 10*3/uL (ref 4.0–10.5)
nRBC: 0 % (ref 0.0–0.2)

## 2023-01-23 LAB — COMPREHENSIVE METABOLIC PANEL
ALT: 22 U/L (ref 0–44)
AST: 27 U/L (ref 15–41)
Albumin: 3.9 g/dL (ref 3.5–5.0)
Alkaline Phosphatase: 56 U/L (ref 38–126)
Anion gap: 10 (ref 5–15)
BUN: 13 mg/dL (ref 8–23)
CO2: 24 mmol/L (ref 22–32)
Calcium: 8.8 mg/dL — ABNORMAL LOW (ref 8.9–10.3)
Chloride: 107 mmol/L (ref 98–111)
Creatinine, Ser: 0.84 mg/dL (ref 0.44–1.00)
GFR, Estimated: >60 mL/min (ref >60)
Glucose, Bld: 78 mg/dL (ref 70–99)
Potassium: 3.7 mmol/L (ref 3.5–5.1)
Sodium: 141 mmol/L (ref 135–145)
Total Bilirubin: 0.2 mg/dL — ABNORMAL LOW (ref 0.3–1.2)
Total Protein: 7.1 g/dL (ref 6.5–8.1)

## 2023-01-23 LAB — FERRITIN: Ferritin: 85 ng/mL (ref 11–307)

## 2023-01-23 LAB — IRON AND TIBC
Iron: 56 ug/dL (ref 28–170)
Saturation Ratios: 18 % (ref 10.4–31.8)
TIBC: 311 ug/dL (ref 250–450)
UIBC: 255 ug/dL

## 2023-01-23 LAB — FOLATE: Folate: >40 ng/mL (ref >5.9)

## 2023-01-23 LAB — VITAMIN B12: Vitamin B-12: 436 pg/mL (ref 180–914)

## 2023-01-23 NOTE — Assessment & Plan Note (Signed)
Patient reports a new protrusion from the vagina, initially misinterpreted as a penile structure. On examination, identified as a prolapse, likely due to weakened pelvic floor muscles. -Refer to Gynecology for further evaluation and management

## 2023-01-23 NOTE — Assessment & Plan Note (Addendum)
Longstanding anemia with no known cause. No history of blood transfusions or IV iron infusions. Patient reports fatigue but no shortness of breath. Last colonoscopy approximately 5 years ago with no abnormalities. -Order complete blood count, iron studies, vitamin B12, and folate levels. -Encouraged a balanced diet, noting the patient's reported preference for sweets and limited meat intake. -RTC in 2 weeks to discuss results and further management

## 2023-01-23 NOTE — Progress Notes (Signed)
[facility] at [facility] HEMATOLOGY NEW VISIT  [facility]  REASON FOR REFERRAL: Anemia   HISTORY OF PRESENT ILLNESS: Alyssa Poole 62 y.o. female referred for anemia.  She is accompanied by her husband today.  She has a past medical history of hemorrhagic stroke, hypertension, A-fib not on anticoagulation.  She reported that she had anemia since childhood. She denies any history of blood transfusions or IV iron infusions. She reports fatigue but denies shortness of breath. She also reports a history of food poisoning which required hospitalization and resulted in weight loss. She denies any recent changes in bowel habits or blood in stools. Her last colonoscopy was approximately five years ago and was reportedly normal.  The patient also reports a history of heart rate abnormalities and a hemorrhagic stroke six years ago.  She reports night sweats, which she attributes to hot flashes. She has a poor diet, mainly consisting of sweets, and she denies any history of diabetes.  The patient also reports a recent development of what she describes as a "penis" in her genital area. This has been evaluated by a gynecologist who reportedly told her it was normal. The patient denies any urinary symptoms related to this.  She quit smoking after the stroke and denies alcohol use.  I have reviewed the past medical history, past surgical history, social history and family history with the patient   ALLERGIES:  is allergic to bee venom.  MEDICATIONS:  Current Outpatient Medications  Medication Sig Dispense Refill   alendronate (FOSAMAX) 70 MG tablet Take 70 mg by mouth every 7 (seven) days. Takes on Sunday. Take with a full glass of water on an empty stomach.     aspirin EC 81 MG tablet Take 81 mg by mouth daily. Swallow whole.     atorvastatin (LIPITOR) 10 MG tablet Take 10 mg by mouth at bedtime.     Black Cohosh 540 MG CAPS Take 1 capsule by mouth daily.      [facility] CALCIUM 600+D 600-800 MG-UNIT TABS Take 1 tablet by mouth daily.  11   escitalopram (LEXAPRO) 10 MG tablet Take 10 mg by mouth daily.     Ferrous Sulfate (IRON PO) Take 1 tablet by mouth daily.     HYDROcodone-acetaminophen (NORCO) 10-325 MG tablet Take 1 tablet by mouth every 6 (six) hours as needed.     magnesium oxide (MAGOX 400) 400 (241.3 Mg) MG tablet Take 1 tablet (400 mg total) by mouth daily. 30 tablet 1   naproxen (NAPROSYN) 500 MG tablet Take 500 mg by mouth daily.     ondansetron (ZOFRAN) 4 MG tablet Take 1 tablet (4 mg total) by mouth every 6 (six) hours. 12 tablet 0   pantoprazole (PROTONIX) 40 MG tablet Take 1 tablet (40 mg total) by mouth 2 (two) times daily. 60 tablet 2   potassium chloride SA (KLOR-CON) 20 MEQ tablet Take 1 tablet (20 mEq total) by mouth daily. 30 tablet 1   pregabalin (LYRICA) 25 MG capsule Take 25 mg by mouth 3 (three) times daily as needed (leg pain).     sotalol (BETAPACE) 120 MG tablet Take 0.5 tablets (60 mg total) by mouth daily.     topiramate (TOPAMAX) 50 MG tablet Take 50 mg by mouth daily.     No current facility-administered medications for this visit.     REVIEW OF SYSTEMS:   Constitutional: Denies fevers, chills or night sweats Eyes: Denies blurriness of vision Ears, nose, mouth,  throat, and face: Denies mucositis or sore throat Respiratory: Denies cough, dyspnea or wheezes Cardiovascular: Denies palpitation, chest discomfort or lower extremity swelling Gastrointestinal:  Denies nausea, heartburn or change in bowel habits Skin: Denies abnormal skin rashes Lymphatics: Denies new lymphadenopathy or easy bruising Neurological:Denies numbness, tingling or new weaknesses Behavioral/Psych: Mood is stable, no new changes  All other systems were reviewed with the patient and are negative.  PHYSICAL EXAMINATION:   Vitals:   01/23/23 1112  BP: (!) 129/57  Pulse: (!) 56  Resp: 18  Temp: 97.8 F (36.6 C)  SpO2: 100%    GENERAL:alert,  no distress and comfortable LUNGS: clear to auscultation and percussion with normal breathing effort HEART: regular rate & rhythm and no murmurs and no lower extremity edema ABDOMEN:abdomen soft, non-tender and normal bowel sounds Musculoskeletal:no cyanosis of digits and no clubbing  NEURO: alert & oriented x 3 with fluent speech GENITOURINARY: Inspection reveals a prolapse.  LABORATORY DATA:  I have reviewed the data as listed and reports from primary care Labs dated 12/13/2022 revealed a CBC: Hemoglobin: 10.2, hematocrit: 32, MCV: 98, WBC: 7.7, platelets: 333 CMP: WNL  Lab Results  Component Value Date   WBC 7.0 10/18/2022   NEUTROABS 4.2 10/18/2022   HGB 9.1 (L) 10/18/2022   HCT 27.4 (L) 10/18/2022   MCV 93.2 10/18/2022   PLT 327 10/18/2022      Component Value Date/Time   NA 138 10/18/2022 0508   K 3.1 (L) 10/18/2022 0508   CL 111 10/18/2022 0508   CO2 21 (L) 10/18/2022 0508   GLUCOSE 92 10/18/2022 0508   BUN <5 (L) 10/18/2022 0508   CREATININE 0.69 10/18/2022 0508   CALCIUM 7.9 (L) 10/18/2022 0508   PROT 8.0 10/16/2022 1111   ALBUMIN 3.7 10/16/2022 1111   AST 45 (H) 10/16/2022 1111   ALT 38 10/16/2022 1111   ALKPHOS 53 10/16/2022 1111   BILITOT 0.3 10/16/2022 1111   GFRNONAA >60 10/18/2022 0508   GFRAA >60 01/29/2017 1957       Chemistry      Component Value Date/Time   NA 138 10/18/2022 0508   K 3.1 (L) 10/18/2022 0508   CL 111 10/18/2022 0508   CO2 21 (L) 10/18/2022 0508   BUN <5 (L) 10/18/2022 0508   CREATININE 0.69 10/18/2022 0508      Component Value Date/Time   CALCIUM 7.9 (L) 10/18/2022 0508   ALKPHOS 53 10/16/2022 1111   AST 45 (H) 10/16/2022 1111   ALT 38 10/16/2022 1111   BILITOT 0.3 10/16/2022 1111       RADIOGRAPHIC STUDIES: I have personally reviewed the radiological images as listed and agreed with the findings in the report. No results found.  ASSESSMENT & PLAN:  Anemia Longstanding anemia with no known cause. No history of  blood transfusions or IV iron infusions. Patient reports fatigue but no shortness of breath. Last colonoscopy approximately 5 years ago with no abnormalities. -Order complete blood count, iron studies, vitamin B12, and folate levels. -Encouraged a balanced diet, noting the patient's reported preference for sweets and limited meat intake. -RTC in 2 weeks to discuss results and further management  Prolapse of female pelvic organs Patient reports a new protrusion from the vagina, initially misinterpreted as a penile structure. On examination, identified as a prolapse, likely due to weakened pelvic floor muscles. -Refer to Gynecology for further evaluation and management   Orders Placed This Encounter  Procedures   CBC with Differential/Platelet    Standing Status:  Future    Standing Expiration Date:   01/23/2024   Comprehensive metabolic panel    Standing Status:   Future    Standing Expiration Date:   01/23/2024   Ferritin    Standing Status:   Future    Standing Expiration Date:   01/23/2024   Folate    Standing Status:   Future    Standing Expiration Date:   01/23/2024   Vitamin B12    Standing Status:   Future    Standing Expiration Date:   01/23/2024   Iron and TIBC    Standing Status:   Future    Standing Expiration Date:   01/23/2024    The total time spent in the appointment was 30 minutes encounter with patients including review of chart and various tests results, discussions about plan of care and coordination of care plan   All questions were answered. The patient knows to call the clinic with any problems, questions or concerns. No barriers to learning was detected.   Cindie Crumbly, MD 10/24/202411:51 AM

## 2023-01-23 NOTE — Patient Instructions (Signed)
VISIT SUMMARY:  During today's visit, we discussed several health concerns including your chronic anemia, a new pelvic organ prolapse, your history of stroke, and brittle bones. We also reviewed your general health maintenance and dietary habits.  YOUR PLAN:  -CHRONIC ANEMIA: Chronic anemia is a condition where you have a lower than normal number of red blood cells, which can cause fatigue. We will order a complete blood count, iron studies, vitamin B12, and folate levels to investigate further. If these tests do not provide answers, we may refer you to a Hematologist for further evaluation.  -PELVIC ORGAN PROLAPSE: Pelvic organ prolapse occurs when the muscles and tissues supporting the pelvic organs become weak, causing them to drop into the vagina. We will refer you to a Gynecologist for further evaluation and to discuss potential surgical options.   INSTRUCTIONS:  Please complete the lab tests for your anemia as soon as possible. We will follow up with the results and discuss the next steps. Additionally, schedule an appointment with a Gynecologist for further evaluation of the pelvic organ prolapse. Continue with your current management for your history of stroke and brittle bones, and try to incorporate more balanced meals into your diet.

## 2023-02-07 ENCOUNTER — Encounter: Payer: Self-pay | Admitting: Oncology

## 2023-02-07 ENCOUNTER — Inpatient Hospital Stay: Payer: Medicare HMO

## 2023-02-07 ENCOUNTER — Inpatient Hospital Stay: Payer: Medicare HMO | Attending: Oncology | Admitting: Oncology

## 2023-02-07 VITALS — BP 140/57 | HR 61 | Temp 98.4°F | Resp 17 | Wt 131.5 lb

## 2023-02-07 DIAGNOSIS — E538 Deficiency of other specified B group vitamins: Secondary | ICD-10-CM | POA: Insufficient documentation

## 2023-02-07 DIAGNOSIS — D509 Iron deficiency anemia, unspecified: Secondary | ICD-10-CM | POA: Insufficient documentation

## 2023-02-07 DIAGNOSIS — D649 Anemia, unspecified: Secondary | ICD-10-CM | POA: Diagnosis not present

## 2023-02-07 DIAGNOSIS — I4891 Unspecified atrial fibrillation: Secondary | ICD-10-CM | POA: Insufficient documentation

## 2023-02-07 DIAGNOSIS — N819 Female genital prolapse, unspecified: Secondary | ICD-10-CM | POA: Diagnosis not present

## 2023-02-07 LAB — FOLATE: Folate: >40 ng/mL (ref >5.9)

## 2023-02-07 LAB — VITAMIN B12: Vitamin B-12: 394 pg/mL (ref 180–914)

## 2023-02-07 MED ORDER — VITAMIN B-12 1000 MCG PO TABS: 1000.0000 ug | ORAL_TABLET | Freq: Every day | ORAL | 0 refills | Status: DC

## 2023-02-07 MED ORDER — FERROUS SULFATE 325 (65 FE) MG PO TBEC: 325.0000 mg | DELAYED_RELEASE_TABLET | ORAL | 3 refills | Status: DC

## 2023-02-07 NOTE — Assessment & Plan Note (Signed)
Patient has a history of A-fib and is not on anticoagulation.  Reports occasional fatigue due to A-fib -Continue to follow with cardiology

## 2023-02-07 NOTE — Assessment & Plan Note (Signed)
Persistent anemia with very mild iron deficiency vitamin B12 deficiency. No clear etiology identified from previous workup. Discussed potential differential diagnoses including multiple myeloma. -Order labs for folate, copper, zinc, and multiple myeloma workup. -Prescribed iron pills 325 mg to be taken every other day. Warned about potential constipation and advised to take Miralax if needed. -Prescribed cyanocobalamin 1000 mcg to be taken daily -Follow-up in 1 month to review lab results and reassess anemia.

## 2023-02-07 NOTE — Patient Instructions (Signed)
VISIT SUMMARY:  During today's visit, we discussed your ongoing anemia, occasional fatigue due to atrial fibrillation, hair loss, and a concern raised by your companion about a previous doctor. We reviewed your symptoms and planned further tests to identify the cause of your anemia. We also discussed your current management for atrial fibrillation and addressed your hair loss.  YOUR PLAN:  -ANEMIA: Anemia is a condition where you don't have enough healthy red blood cells to carry adequate oxygen to your body's tissues. We will conduct additional tests for vitamin B12, folate, copper, zinc, and multiple myeloma to find the cause. You should continue taking iron pills every other day, and you can pick them up from [facility] in [place]. If you experience constipation, you can take Miralax.  -ATRIAL FIBRILLATION: Atrial fibrillation is an irregular and often rapid heart rate that can lead to poor blood flow. You mentioned occasional fatigue, which is a common symptom. We will continue with your current management plan.   INSTRUCTIONS:  Please follow up in 1 month to review your lab results and reassess your anemia. Continue taking your iron pills every other day and monitor for any side effects. If you experience constipation, you can take Miralax. If you have any concerns or new symptoms, please contact our office.

## 2023-02-07 NOTE — Progress Notes (Signed)
[facility] at [facility] HEMATOLOGY FOLLOW-UP VISIT  Center, [facility]  REASON FOR FOLLOW-UP: Anemia  ASSESSMENT & PLAN:  Patient is a 62 year old female with hemorrhagic stroke, hypertension, A-fib not on anticoagulation came for follow-up for her anemia .  Anemia Persistent anemia with very mild iron deficiency vitamin B12 deficiency. No clear etiology identified from previous workup. Discussed potential differential diagnoses including multiple myeloma. -Order labs for folate, copper, zinc, and multiple myeloma workup. -Prescribed iron pills 325 mg to be taken every other day. Warned about potential constipation and advised to take Miralax if needed. -Prescribed cyanocobalamin 1000 mcg to be taken daily -Follow-up in 1 month to review lab results and reassess anemia.  Prolapse of female pelvic organs Patient reports a new protrusion from the vagina that was discussed starting last visit, likely pelvic organ prolapse -Refer to Gynecology for further evaluation and management.  Patient stated that she wanted a different physician.  Will send a referral to an another facility  A-fib [facility]) Patient has a history of A-fib and is not on anticoagulation.  Reports occasional fatigue due to A-fib -Continue to follow with cardiology   Orders Placed This Encounter  Procedures   Folate    Standing Status:   Future    Number of Occurrences:   1    Standing Expiration Date:   02/07/2024   Vitamin B12    Standing Status:   Future    Number of Occurrences:   1    Standing Expiration Date:   02/07/2024   Copper, serum    Standing Status:   Future    Number of Occurrences:   1    Standing Expiration Date:   02/07/2024   Kappa/lambda light chains    Standing Status:   Future    Number of Occurrences:   1    Standing Expiration Date:   02/07/2024   Multiple Myeloma Panel (SPEP&IFE w/QIG)    Standing Status:   Future    Number of Occurrences:   1    Standing  Expiration Date:   02/07/2024   Beta 2 microglobulin, serum    Standing Status:   Future    Number of Occurrences:   1    Standing Expiration Date:   02/07/2024   Zinc    Standing Status:   Future    Number of Occurrences:   1    Standing Expiration Date:   02/07/2024    The total time spent in the appointment was 20 minutes encounter with patients including review of chart and various tests results, discussions about plan of care and coordination of care plan   All questions were answered. The patient knows to call the clinic with any problems, questions or concerns. No barriers to learning was detected.  Cindie Crumbly, MD 11/8/202412:41 PM   INTERVAL HISTORY: Alyssa Poole 62 y.o. female is here for follow-up for anemia. She reports feeling tired 'now and then' due to her heart condition. She also reports occasional numbness and tingling in her arms and legs.The patient's diet is primarily composed of sweets, meat, cream potatoes, peanut butter beans, and mac and cheese.  We discussed that the labs showed mild iron deficiency and vitamin B12 deficiency and will prescribe supplements for the same.  Also discussed extending workup to multiple myeloma workup.  Overall patient is doing very well  I have reviewed the past medical history, past surgical history, social history and family history with the patient  ALLERGIES:  is allergic to bee venom.  MEDICATIONS:  Current Outpatient Medications  Medication Sig Dispense Refill   alendronate (FOSAMAX) 70 MG tablet Take 70 mg by mouth every 7 (seven) days. Takes on Sunday. Take with a full glass of water on an empty stomach.     aspirin EC 81 MG tablet Take 81 mg by mouth daily. Swallow whole.     atorvastatin (LIPITOR) 10 MG tablet Take 10 mg by mouth at bedtime.     Black Cohosh 540 MG CAPS Take 1 capsule by mouth daily.     [facility] CALCIUM 600+D 600-800 MG-UNIT TABS Take 1 tablet by mouth daily.  11   cyanocobalamin (VITAMIN B12) 1000  MCG tablet Take 1 tablet (1,000 mcg total) by mouth daily. 90 tablet 0   escitalopram (LEXAPRO) 10 MG tablet Take 10 mg by mouth daily.     Ferrous Sulfate (IRON PO) Take 1 tablet by mouth daily.     ferrous sulfate 325 (65 FE) MG EC tablet Take 1 tablet (325 mg total) by mouth every other day. 45 tablet 3   HYDROcodone-acetaminophen (NORCO) 10-325 MG tablet Take 1 tablet by mouth every 6 (six) hours as needed.     magnesium oxide (MAGOX 400) 400 (241.3 Mg) MG tablet Take 1 tablet (400 mg total) by mouth daily. 30 tablet 1   naproxen (NAPROSYN) 500 MG tablet Take 500 mg by mouth daily.     ondansetron (ZOFRAN) 4 MG tablet Take 1 tablet (4 mg total) by mouth every 6 (six) hours. 12 tablet 0   pantoprazole (PROTONIX) 40 MG tablet Take 1 tablet (40 mg total) by mouth 2 (two) times daily. 60 tablet 2   potassium chloride SA (KLOR-CON) 20 MEQ tablet Take 1 tablet (20 mEq total) by mouth daily. 30 tablet 1   pregabalin (LYRICA) 25 MG capsule Take 25 mg by mouth 3 (three) times daily as needed (leg pain).     sotalol (BETAPACE) 120 MG tablet Take 0.5 tablets (60 mg total) by mouth daily.     topiramate (TOPAMAX) 50 MG tablet Take 50 mg by mouth daily.     No current facility-administered medications for this visit.     REVIEW OF SYSTEMS:   Constitutional: Denies fevers, chills or night sweats Eyes: Denies blurriness of vision Ears, nose, mouth, throat, and face: Denies mucositis or sore throat Respiratory: Denies cough, dyspnea or wheezes Cardiovascular: Denies palpitation, chest discomfort or lower extremity swelling Gastrointestinal:  Denies nausea, heartburn or change in bowel habits Skin: Denies abnormal skin rashes Lymphatics: Denies new lymphadenopathy or easy bruising Neurological:Denies numbness, tingling or new weaknesses Behavioral/Psych: Mood is stable, no new changes  All other systems were reviewed with the patient and are negative.  PHYSICAL EXAMINATION:   Vitals:   02/07/23  0915  BP: (!) 140/57  Pulse: 61  Resp: 17  Temp: 98.4 F (36.9 C)  SpO2: 100%    GENERAL:alert, no distress and comfortable LUNGS: clear to auscultation and percussion with normal breathing effort HEART: regular rate & rhythm and no murmurs and no lower extremity edema ABDOMEN:abdomen soft, non-tender and normal bowel sounds Musculoskeletal:no cyanosis of digits and no clubbing  NEURO: alert & oriented x 3 with fluent speech  LABORATORY DATA:  I have reviewed the data as listed  Lab Results  Component Value Date   WBC 8.4 01/23/2023   NEUTROABS 4.7 01/23/2023   HGB 10.4 (L) 01/23/2023   HCT 33.0 (L) 01/23/2023   MCV 98.8 01/23/2023  PLT 348 01/23/2023      Component Value Date/Time   NA 141 01/23/2023 1153   K 3.7 01/23/2023 1153   CL 107 01/23/2023 1153   CO2 24 01/23/2023 1153   GLUCOSE 78 01/23/2023 1153   BUN 13 01/23/2023 1153   CREATININE 0.84 01/23/2023 1153   CALCIUM 8.8 (L) 01/23/2023 1153   PROT 7.1 01/23/2023 1153   ALBUMIN 3.9 01/23/2023 1153   AST 27 01/23/2023 1153   ALT 22 01/23/2023 1153   ALKPHOS 56 01/23/2023 1153   BILITOT 0.2 (L) 01/23/2023 1153   GFRNONAA >60 01/23/2023 1153   GFRAA >60 01/29/2017 1957       Chemistry      Component Value Date/Time   NA 141 01/23/2023 1153   K 3.7 01/23/2023 1153   CL 107 01/23/2023 1153   CO2 24 01/23/2023 1153   BUN 13 01/23/2023 1153   CREATININE 0.84 01/23/2023 1153      Component Value Date/Time   CALCIUM 8.8 (L) 01/23/2023 1153   ALKPHOS 56 01/23/2023 1153   AST 27 01/23/2023 1153   ALT 22 01/23/2023 1153   BILITOT 0.2 (L) 01/23/2023 1153     Lab Results  Component Value Date   IRON 56 01/23/2023   TIBC 311 01/23/2023   FERRITIN 85 01/23/2023    Latest Reference Range & Units 01/23/23 11:53 02/07/23 09:41  Vitamin B12 180 - 914 pg/mL 436 394

## 2023-02-07 NOTE — Assessment & Plan Note (Addendum)
Patient reports a new protrusion from the vagina that was discussed starting last visit, likely pelvic organ prolapse -Refer to Gynecology for further evaluation and management.  Patient stated that she wanted a different physician.  Will send a referral to an another facility

## 2023-02-08 LAB — BETA 2 MICROGLOBULIN, SERUM: Beta-2 Microglobulin: 2.2 mg/L (ref 0.6–2.4)

## 2023-02-10 LAB — KAPPA/LAMBDA LIGHT CHAINS
Kappa free light chain: 39.1 mg/L — ABNORMAL HIGH (ref 3.3–19.4)
Kappa, lambda light chain ratio: 1.5 (ref 0.26–1.65)
Lambda free light chains: 26 mg/L (ref 5.7–26.3)

## 2023-02-11 LAB — ZINC: Zinc: 55 ug/dL (ref 44–115)

## 2023-02-11 LAB — COPPER, SERUM: Copper: 98 ug/dL (ref 80–158)

## 2023-02-13 LAB — MULTIPLE MYELOMA PANEL, SERUM
Albumin SerPl Elph-Mcnc: 3.6 g/dL (ref 2.9–4.4)
Albumin/Glob SerPl: 1.3 (ref 0.7–1.7)
Alpha 1: 0.3 g/dL (ref 0.0–0.4)
Alpha2 Glob SerPl Elph-Mcnc: 0.6 g/dL (ref 0.4–1.0)
B-Globulin SerPl Elph-Mcnc: 0.9 g/dL (ref 0.7–1.3)
Gamma Glob SerPl Elph-Mcnc: 1.2 g/dL (ref 0.4–1.8)
Globulin, Total: 3 g/dL (ref 2.2–3.9)
IgA: 241 mg/dL (ref 87–352)
IgG (Immunoglobin G), Serum: 1211 mg/dL (ref 586–1602)
IgM (Immunoglobulin M), Srm: 39 mg/dL (ref 26–217)
Total Protein ELP: 6.6 g/dL (ref 6.0–8.5)

## 2023-02-19 ENCOUNTER — Ambulatory Visit: Payer: Medicare HMO | Admitting: Obstetrics & Gynecology

## 2023-02-20 ENCOUNTER — Other Ambulatory Visit (HOSPITAL_COMMUNITY): Payer: Self-pay | Admitting: Neurological Surgery

## 2023-02-20 DIAGNOSIS — M961 Postlaminectomy syndrome, not elsewhere classified: Secondary | ICD-10-CM

## 2023-02-26 ENCOUNTER — Inpatient Hospital Stay: Payer: Medicare HMO

## 2023-02-26 ENCOUNTER — Inpatient Hospital Stay (HOSPITAL_BASED_OUTPATIENT_CLINIC_OR_DEPARTMENT_OTHER): Payer: Medicare HMO | Admitting: Oncology

## 2023-02-26 ENCOUNTER — Other Ambulatory Visit: Payer: Self-pay

## 2023-02-26 VITALS — BP 125/78 | HR 48 | Temp 97.7°F | Resp 16 | Wt 132.3 lb

## 2023-02-26 DIAGNOSIS — D649 Anemia, unspecified: Secondary | ICD-10-CM

## 2023-02-26 DIAGNOSIS — R768 Other specified abnormal immunological findings in serum: Secondary | ICD-10-CM | POA: Diagnosis not present

## 2023-02-26 DIAGNOSIS — I4891 Unspecified atrial fibrillation: Secondary | ICD-10-CM

## 2023-02-26 DIAGNOSIS — D509 Iron deficiency anemia, unspecified: Secondary | ICD-10-CM | POA: Diagnosis not present

## 2023-02-26 NOTE — Progress Notes (Signed)
[facility] at [facility] HEMATOLOGY FOLLOW-UP VISIT  Center, [facility]  REASON FOR FOLLOW-UP: Anemia  ASSESSMENT & PLAN:  Patient is a 62 year old female with hemorrhagic stroke, hypertension, A-fib not on anticoagulation came for follow-up for her anemia .  Anemia Persistent anemia with very mild iron deficiency vitamin B12 deficiency. No clear etiology identified from previous workup.  Elevated free kappa light chains with normal immunoglobulins and SPEP.  No M spike. -Will obtain 24-hour urine free light chains and UPEP to rule out light chain only disease -Continue iron pills 325 mg to be taken every other day. Warned about potential constipation and advised to take Miralax if needed. -Continue cyanocobalamin 1000 mcg to be taken daily -Follow-up in 1 month to review lab results and reassess anemia.  A-fib [facility]) Patient has a history of A-fib and is not on anticoagulation.  Reports occasional fatigue due to A-fib -Continue to follow with cardiology    Orders Placed This Encounter  Procedures   UPEP/UIFE/Light Chains/TP, 24-Hr Ur    Standing Status:   Future    Standing Expiration Date:   02/26/2024    The total time spent in the appointment was 20 minutes encounter with patients including review of chart and various tests results, discussions about plan of care and coordination of care plan   All questions were answered. The patient knows to call the clinic with any problems, questions or concerns. No barriers to learning was detected.  Cindie Crumbly, MD 11/27/20243:54 PM   INTERVAL HISTORY: Alyssa Poole 61 y.o. female is here for follow-up for anemia.  Patient is doing very well today and has no complaints.  Reports that she is excited to cook for Thanksgiving.  She stated that she is taking her iron pills every other day and B12 every day.  We discussed that her labs showed that her kappa free light chains are elevated and  discussed obtaining a 24-hour urine test.  Patient is agreeable with that.  I have reviewed the past medical history, past surgical history, social history and family history with the patient   ALLERGIES:  is allergic to bee venom.  MEDICATIONS:  Current Outpatient Medications  Medication Sig Dispense Refill   alendronate (FOSAMAX) 70 MG tablet Take 70 mg by mouth every 7 (seven) days. Takes on Sunday. Take with a full glass of water on an empty stomach.     aspirin EC 81 MG tablet Take 81 mg by mouth daily. Swallow whole.     atorvastatin (LIPITOR) 10 MG tablet Take 10 mg by mouth at bedtime.     Black Cohosh 540 MG CAPS Take 1 capsule by mouth daily.     calcium carbonate (OSCAL) 1500 (600 Ca) MG TABS tablet Take 1 tablet by mouth daily.     [facility] CALCIUM 600+D 600-800 MG-UNIT TABS Take 1 tablet by mouth daily.  11   cyanocobalamin (VITAMIN B12) 1000 MCG tablet Take 1 tablet (1,000 mcg total) by mouth daily. 90 tablet 0   escitalopram (LEXAPRO) 10 MG tablet Take 10 mg by mouth daily.     Ferrous Sulfate (IRON PO) Take 1 tablet by mouth daily.     ferrous sulfate 325 (65 FE) MG EC tablet Take 1 tablet (325 mg total) by mouth every other day. 45 tablet 3   HYDROcodone-acetaminophen (NORCO) 10-325 MG tablet Take 1 tablet by mouth every 6 (six) hours as needed.     magnesium oxide (MAGOX 400) 400 (241.3 Mg) MG tablet  Take 1 tablet (400 mg total) by mouth daily. 30 tablet 1   naproxen (NAPROSYN) 500 MG tablet Take 500 mg by mouth daily.     ondansetron (ZOFRAN) 4 MG tablet Take 1 tablet (4 mg total) by mouth every 6 (six) hours. 12 tablet 0   pantoprazole (PROTONIX) 40 MG tablet Take 1 tablet (40 mg total) by mouth 2 (two) times daily. 60 tablet 2   potassium chloride SA (KLOR-CON) 20 MEQ tablet Take 1 tablet (20 mEq total) by mouth daily. 30 tablet 1   pregabalin (LYRICA) 25 MG capsule Take 25 mg by mouth 3 (three) times daily as needed (leg pain).     sotalol (BETAPACE) 120 MG tablet Take 0.5  tablets (60 mg total) by mouth daily.     topiramate (TOPAMAX) 50 MG tablet Take 50 mg by mouth daily.     No current facility-administered medications for this visit.     REVIEW OF SYSTEMS:   Constitutional: Denies fevers, chills or night sweats Eyes: Denies blurriness of vision Ears, nose, mouth, throat, and face: Denies mucositis or sore throat Respiratory: Denies cough, dyspnea or wheezes Cardiovascular: Denies palpitation, chest discomfort or lower extremity swelling Gastrointestinal:  Denies nausea, heartburn or change in bowel habits Skin: Denies abnormal skin rashes Lymphatics: Denies new lymphadenopathy or easy bruising Neurological:Denies numbness, tingling or new weaknesses Behavioral/Psych: Mood is stable, no new changes  All other systems were reviewed with the patient and are negative.  PHYSICAL EXAMINATION:   Vitals:   02/26/23 1144  BP: 125/78  Pulse: (!) 48  Resp: 16  Temp: 97.7 F (36.5 C)  SpO2: 100%    GENERAL:alert, no distress and comfortable LUNGS: clear to auscultation and percussion with normal breathing effort HEART: regular rate & rhythm and no murmurs and no lower extremity edema ABDOMEN:abdomen soft, non-tender and normal bowel sounds Musculoskeletal:no cyanosis of digits and no clubbing  NEURO: alert & oriented x 3 with fluent speech  LABORATORY DATA:  I have reviewed the data as listed  Lab Results  Component Value Date   WBC 8.4 01/23/2023   NEUTROABS 4.7 01/23/2023   HGB 10.4 (L) 01/23/2023   HCT 33.0 (L) 01/23/2023   MCV 98.8 01/23/2023   PLT 348 01/23/2023      Component Value Date/Time   NA 141 01/23/2023 1153   K 3.7 01/23/2023 1153   CL 107 01/23/2023 1153   CO2 24 01/23/2023 1153   GLUCOSE 78 01/23/2023 1153   BUN 13 01/23/2023 1153   CREATININE 0.84 01/23/2023 1153   CALCIUM 8.8 (L) 01/23/2023 1153   PROT 7.1 01/23/2023 1153   ALBUMIN 3.9 01/23/2023 1153   AST 27 01/23/2023 1153   ALT 22 01/23/2023 1153   ALKPHOS  56 01/23/2023 1153   BILITOT 0.2 (L) 01/23/2023 1153   GFRNONAA >60 01/23/2023 1153   GFRAA >60 01/29/2017 1957       Chemistry      Component Value Date/Time   NA 141 01/23/2023 1153   K 3.7 01/23/2023 1153   CL 107 01/23/2023 1153   CO2 24 01/23/2023 1153   BUN 13 01/23/2023 1153   CREATININE 0.84 01/23/2023 1153      Component Value Date/Time   CALCIUM 8.8 (L) 01/23/2023 1153   ALKPHOS 56 01/23/2023 1153   AST 27 01/23/2023 1153   ALT 22 01/23/2023 1153   BILITOT 0.2 (L) 01/23/2023 1153     Lab Results  Component Value Date   IRON 56 01/23/2023  TIBC 311 01/23/2023   FERRITIN 85 01/23/2023    Latest Reference Range & Units 01/23/23 11:53 02/07/23 09:41  Vitamin B12 180 - 914 pg/mL 436 394    Latest Reference Range & Units 02/07/23 09:41  IgG (Immunoglobin G), Serum 586 - 1,602 mg/dL 1,610  IgM (Immunoglobulin M), Srm 26 - 217 mg/dL 39  IgA 87 - 960 mg/dL 454    Latest Reference Range & Units 02/07/23 09:38  Kappa free light chain 3.3 - 19.4 mg/L 39.1 (H)  Lambda free light chains 5.7 - 26.3 mg/L 26.0  Kappa, lambda light chain ratio 0.26 - 1.65  1.50  (H): Data is abnormally high  Latest Reference Range & Units 02/07/23 09:41  Total Protein ELP 6.0 - 8.5 g/dL 6.6 (C)  Albumin SerPl Elph-Mcnc 2.9 - 4.4 g/dL 3.6 (C)  Albumin/Glob SerPl 0.7 - 1.7  1.3 (C)  Alpha2 Glob SerPl Elph-Mcnc 0.4 - 1.0 g/dL 0.6 (C)  Alpha 1 0.0 - 0.4 g/dL 0.3 (C)  Gamma Glob SerPl Elph-Mcnc 0.4 - 1.8 g/dL 1.2 (C)  M Protein SerPl Elph-Mcnc Not Observed g/dL Not Observed (C)  IFE 1  Comment (C)  Globulin, Total 2.2 - 3.9 g/dL 3.0 (C)  B-Globulin SerPl Elph-Mcnc 0.7 - 1.3 g/dL 0.9 (C)  (C): Corrected

## 2023-02-26 NOTE — Assessment & Plan Note (Signed)
Persistent anemia with very mild iron deficiency vitamin B12 deficiency. No clear etiology identified from previous workup.  Elevated free kappa light chains with normal immunoglobulins and SPEP.  No M spike. -Will obtain 24-hour urine free light chains and UPEP to rule out light chain only disease -Continue iron pills 325 mg to be taken every other day. Warned about potential constipation and advised to take Miralax if needed. -Continue cyanocobalamin 1000 mcg to be taken daily -Follow-up in 1 month to review lab results and reassess anemia.

## 2023-02-26 NOTE — Assessment & Plan Note (Signed)
Patient has a history of A-fib and is not on anticoagulation.  Reports occasional fatigue due to A-fib -Continue to follow with cardiology

## 2023-03-05 ENCOUNTER — Inpatient Hospital Stay: Payer: Medicare HMO | Admitting: Oncology

## 2023-03-06 ENCOUNTER — Ambulatory Visit (HOSPITAL_COMMUNITY)
Admission: RE | Admit: 2023-03-06 | Discharge: 2023-03-06 | Disposition: A | Discharge status: Home / Self Care | Payer: Medicare HMO | Source: Ambulatory Visit | Attending: Neurological Surgery | Admitting: Neurological Surgery

## 2023-03-06 DIAGNOSIS — M961 Postlaminectomy syndrome, not elsewhere classified: Secondary | ICD-10-CM | POA: Diagnosis present

## 2023-03-06 MED ORDER — GADOBUTROL 1 MMOL/ML IV SOLN
7.0000 mL | Freq: Once | INTRAVENOUS | Status: AC | PRN
  Administered 2023-03-06: 7 mL via INTRAVENOUS

## 2023-04-09 ENCOUNTER — Ambulatory Visit: Payer: Medicare HMO | Admitting: Obstetrics and Gynecology

## 2023-04-09 ENCOUNTER — Encounter: Payer: Self-pay | Admitting: Obstetrics and Gynecology

## 2023-04-09 VITALS — BP 110/70 | HR 63 | Ht 61.0 in | Wt 139.0 lb

## 2023-04-09 DIAGNOSIS — M6289 Other specified disorders of muscle: Secondary | ICD-10-CM

## 2023-04-09 DIAGNOSIS — N813 Complete uterovaginal prolapse: Secondary | ICD-10-CM | POA: Diagnosis not present

## 2023-04-09 DIAGNOSIS — R32 Unspecified urinary incontinence: Secondary | ICD-10-CM | POA: Diagnosis not present

## 2023-04-09 NOTE — Assessment & Plan Note (Addendum)
 Patient referred for pelvic organ prolapse. Referral documents were reviewed. Discussed uterine prolapse, Grade 4. Prolapse is associated with urinary dysfunction-sometimes feels that she is retaining. PVR 125 cc on exam today.  This is normal. Given symptomatic prolapse, I recommend PFPT and pessary or surgical management. She elects for PFPT and pessary placement. Return to office for pessary fitting. Pamphlets provided re: pelvic organ prolapse and pelvic floor exercise.

## 2023-04-09 NOTE — Progress Notes (Signed)
 63 y.o. G64P1001 female with A-fib, prior subarachnoid hemorrhage (2018) here for referral for pelvic organ prolapse. Married. Presents with her husband.  No LMP recorded. Patient is postmenopausal. She reports pelvic bulge since summer 2024.  It is bothersome at times but it is not painful.  She has started having less intercourse due to the prolapse.  She reports difficulty emptying her bladder sometimes.  She denies urinary leakage, constipation or diarrhea.  No vaginal bleeding.  Sexually active: occasionally  GYN HISTORY: No significant history  OB History  Gravida Para Term Preterm AB Living  1 1 1   1   SAB IAB Ectopic Multiple Live Births          # Outcome Date GA Lbr Len/2nd Weight Sex Type Anes PTL Lv  1 Term             Past Medical History:  Diagnosis Date   Anxiety    Arthritis    Asthma    Atrial fibrillation (HCC)    Chronic headaches    Chronic neck pain    Depression 2002   Dysconjugate gaze    GERD (gastroesophageal reflux disease)    Hypertension    Neuromuscular disorder (HCC)    Osteoporosis    Panic attacks 2002   Stroke [facility])     Past Surgical History:  Procedure Laterality Date   BACK SURGERY     x4   CERVICAL SPINE SURGERY     plate in neck   CHOLECYSTECTOMY     KNEE ARTHROSCOPY WITH MEDIAL MENISECTOMY Left 03/09/2014   Procedure: LEFT KNEE ARTHROSCOPY WITH PARTIAL MEDIAL MENISECTOMY;  Surgeon: Taft FORBES Minerva, MD;  Location: [facility];  Service: Orthopedics;  Laterality: Left;   SHOULDER ARTHROSCOPY Left 11/14/2016   Procedure: ARTHROSCOPY SHOULDER WITH LIMITED DEDBRIDEMENT AND ACROMIOPLASTY AND DISTAL CLAVICAL EXCISION;  Surgeon: Minerva Taft FORBES, MD;  Location: [facility];  Service: Orthopedics;  Laterality: Left;   TOTAL KNEE ARTHROPLASTY Left 09/14/2014   Procedure: LEFT TOTAL KNEE ARTHROPLASTY;  Surgeon: Taft FORBES Minerva, MD;  Location: [facility];  Service: Orthopedics;  Laterality: Left;    Current Outpatient Medications on File Prior  to Visit  Medication Sig Dispense Refill   alendronate (FOSAMAX) 70 MG tablet Take 70 mg by mouth every 7 (seven) days. Takes on Sunday. Take with a full glass of water on an empty stomach.     aspirin  EC 81 MG tablet Take 81 mg by mouth daily. Swallow whole.     atorvastatin  (LIPITOR) 10 MG tablet Take 10 mg by mouth at bedtime.     Black Cohosh  540 MG CAPS Take 1 capsule by mouth daily.     calcium  carbonate (OSCAL) 1500 (600 Ca) MG TABS tablet Take 1 tablet by mouth daily.     [employer] CALCIUM  600+D 600-800 MG-UNIT TABS Take 1 tablet by mouth daily.  11   cyanocobalamin  (VITAMIN B12) 1000 MCG tablet Take 1 tablet (1,000 mcg total) by mouth daily. 90 tablet 0   escitalopram  (LEXAPRO ) 10 MG tablet Take 10 mg by mouth daily.     Ferrous Sulfate  (IRON  PO) Take 1 tablet by mouth daily.     ferrous sulfate  325 (65 FE) MG EC tablet Take 1 tablet (325 mg total) by mouth every other day. 45 tablet 3   HYDROcodone -acetaminophen  (NORCO) 10-325 MG tablet Take 1 tablet by mouth every 6 (six) hours as needed.     magnesium  oxide (MAGOX 400) 400 (241.3 Mg) MG tablet Take 1 tablet (  400 mg total) by mouth daily. 30 tablet 1   naproxen (NAPROSYN) 500 MG tablet Take 500 mg by mouth daily.     ondansetron  (ZOFRAN ) 4 MG tablet Take 1 tablet (4 mg total) by mouth every 6 (six) hours. 12 tablet 0   pantoprazole  (PROTONIX ) 40 MG tablet Take 1 tablet (40 mg total) by mouth 2 (two) times daily. 60 tablet 2   potassium chloride  SA (KLOR-CON ) 20 MEQ tablet Take 1 tablet (20 mEq total) by mouth daily. 30 tablet 1   pregabalin  (LYRICA ) 25 MG capsule Take 25 mg by mouth 3 (three) times daily as needed (leg pain).     sotalol  (BETAPACE ) 120 MG tablet Take 0.5 tablets (60 mg total) by mouth daily.     topiramate  (TOPAMAX ) 50 MG tablet Take 50 mg by mouth daily.     No current facility-administered medications on file prior to visit.    Allergies  Allergen Reactions   Bee Venom Swelling    Causes bad swelling. No epipen    needed      PE Today's Vitals   04/09/23 1108  BP: 110/70  Pulse: 63  SpO2: 99%  Weight: 139 lb (63 kg)  Height: 5' 1 (1.549 m)   Body mass index is 26.26 kg/m.  Physical Exam Vitals reviewed. Exam conducted with a chaperone present.  Constitutional:      General: She is not in acute distress.    Appearance: Normal appearance.  HENT:     Head: Normocephalic and atraumatic.     Nose: Nose normal.  Eyes:     Extraocular Movements: Extraocular movements intact.     Conjunctiva/sclera: Conjunctivae normal.  Pulmonary:     Effort: Pulmonary effort is normal.  Genitourinary:    General: Normal vulva.     Exam position: Lithotomy position.     Vagina: Normal. No vaginal discharge.     Cervix: Normal. No cervical motion tenderness, discharge or lesion.     Uterus: Normal. Not enlarged and not tender.      Adnexa: Right adnexa normal and left adnexa normal.     Comments: 0/5 kegel Complete uterine prolapse Urinary leakage noted with anatomical positioning of uterus Musculoskeletal:        General: Normal range of motion.     Cervical back: Normal range of motion.  Neurological:     General: No focal deficit present.     Mental Status: She is alert.  Psychiatric:        Mood and Affect: Mood normal.        Behavior: Behavior normal.     Bladder catheterization: Bladder catheterization was performed after prepping the urethral opening with betadine with a flexible 14 fr catheter. 125cc was emptied from the bladder. Specimen was collected for Ucx.    Assessment and Plan:        Complete uterine prolapse Assessment & Plan: Patient referred for pelvic organ prolapse. Referral documents were reviewed. Discussed uterine prolapse, Grade 4. Prolapse is associated with urinary dysfunction-sometimes feels that she is retaining. PVR 125 cc on exam today.  This is normal. Given symptomatic prolapse, I recommend PFPT and pessary or surgical management. She elects for PFPT and  pessary placement. Return to office for pessary fitting. Pamphlets provided re: pelvic organ prolapse and pelvic floor exercise.   Orders: -     Ambulatory referral to Physical Therapy  Urinary incontinence, unspecified type Assessment & Plan: Normal PVR, sample unable to be sent today as patient is collecting  urine for 24-hour testing. Will check UA at next appointment Discussed with patient that pessary fitting may worsen urinary incontinence. Will plan for fitting with ring with knob or Gellhorn.    Pelvic floor dysfunction in female Assessment & Plan: Unable to engage pelvic floor to perform Kegel during exam. Recommend pelvic floor PT  Orders: -     Ambulatory referral to Physical Therapy    45 minutes  total time was spent for this patient encounter, including preparation, face-to-face counseling with the patient, coordination of care, and documentation of the encounter.   Vera LULLA Pa, MD

## 2023-04-09 NOTE — Assessment & Plan Note (Signed)
 Unable to engage pelvic floor to perform Kegel during exam. Recommend pelvic floor PT

## 2023-04-09 NOTE — Assessment & Plan Note (Addendum)
 Normal PVR, sample unable to be sent today as patient is collecting urine for 24-hour testing. Will check UA at next appointment Discussed with patient that pessary fitting may worsen urinary incontinence. Will plan for fitting with ring with knob or Gellhorn.

## 2023-04-10 ENCOUNTER — Inpatient Hospital Stay: Payer: Medicare HMO | Attending: Oncology

## 2023-04-10 DIAGNOSIS — D509 Iron deficiency anemia, unspecified: Secondary | ICD-10-CM | POA: Insufficient documentation

## 2023-04-10 DIAGNOSIS — D649 Anemia, unspecified: Secondary | ICD-10-CM

## 2023-04-10 DIAGNOSIS — E538 Deficiency of other specified B group vitamins: Secondary | ICD-10-CM | POA: Diagnosis not present

## 2023-04-10 DIAGNOSIS — R768 Other specified abnormal immunological findings in serum: Secondary | ICD-10-CM

## 2023-04-10 LAB — VITAMIN B12: Vitamin B-12: 1339 pg/mL — ABNORMAL HIGH (ref 180–914)

## 2023-04-10 LAB — FERRITIN: Ferritin: 54 ng/mL (ref 11–307)

## 2023-04-10 LAB — CBC WITH DIFFERENTIAL/PLATELET
Abs Immature Granulocytes: 0.05 10*3/uL (ref 0.00–0.07)
Basophils Absolute: 0.1 10*3/uL (ref 0.0–0.1)
Basophils Relative: 1 %
Eosinophils Absolute: 0.1 10*3/uL (ref 0.0–0.5)
Eosinophils Relative: 1 %
HCT: 33.6 % — ABNORMAL LOW (ref 36.0–46.0)
Hemoglobin: 10.7 g/dL — ABNORMAL LOW (ref 12.0–15.0)
Immature Granulocytes: 1 %
Lymphocytes Relative: 33 %
Lymphs Abs: 3.2 10*3/uL (ref 0.7–4.0)
MCH: 30.9 pg (ref 26.0–34.0)
MCHC: 31.8 g/dL (ref 30.0–36.0)
MCV: 97.1 fL (ref 80.0–100.0)
Monocytes Absolute: 1 10*3/uL (ref 0.1–1.0)
Monocytes Relative: 11 %
Neutro Abs: 5.3 10*3/uL (ref 1.7–7.7)
Neutrophils Relative %: 53 %
Platelets: 317 10*3/uL (ref 150–400)
RBC: 3.46 MIL/uL — ABNORMAL LOW (ref 3.87–5.11)
RDW: 13.4 % (ref 11.5–15.5)
WBC: 9.7 10*3/uL (ref 4.0–10.5)
nRBC: 0 % (ref 0.0–0.2)

## 2023-04-10 LAB — COMPREHENSIVE METABOLIC PANEL
ALT: 21 U/L (ref 0–44)
AST: 25 U/L (ref 15–41)
Albumin: 3.9 g/dL (ref 3.5–5.0)
Alkaline Phosphatase: 44 U/L (ref 38–126)
Anion gap: 9 (ref 5–15)
BUN: 17 mg/dL (ref 8–23)
CO2: 23 mmol/L (ref 22–32)
Calcium: 8.7 mg/dL — ABNORMAL LOW (ref 8.9–10.3)
Chloride: 104 mmol/L (ref 98–111)
Creatinine, Ser: 0.88 mg/dL (ref 0.44–1.00)
GFR, Estimated: >60 mL/min (ref >60)
Glucose, Bld: 87 mg/dL (ref 70–99)
Potassium: 3.4 mmol/L — ABNORMAL LOW (ref 3.5–5.1)
Sodium: 136 mmol/L (ref 135–145)
Total Bilirubin: 0.4 mg/dL (ref 0.0–1.2)
Total Protein: 7.3 g/dL (ref 6.5–8.1)

## 2023-04-10 LAB — FOLATE: Folate: >40 ng/mL (ref >5.9)

## 2023-04-10 LAB — IRON AND TIBC
Iron: 98 ug/dL (ref 28–170)
Saturation Ratios: 29 % (ref 10.4–31.8)
TIBC: 333 ug/dL (ref 250–450)
UIBC: 235 ug/dL

## 2023-04-11 LAB — KAPPA/LAMBDA LIGHT CHAINS
Kappa free light chain: 25.2 mg/L — ABNORMAL HIGH (ref 3.3–19.4)
Kappa, lambda light chain ratio: 1.38 (ref 0.26–1.65)
Lambda free light chains: 18.2 mg/L (ref 5.7–26.3)

## 2023-04-14 DIAGNOSIS — I4811 Longstanding persistent atrial fibrillation: Secondary | ICD-10-CM | POA: Insufficient documentation

## 2023-04-16 ENCOUNTER — Inpatient Hospital Stay: Payer: Medicare HMO | Admitting: Oncology

## 2023-04-16 LAB — UPEP/UIFE/LIGHT CHAINS/TP, 24-HR UR
% BETA, Urine: 31.5 %
ALPHA 1 URINE: 3.8 %
Albumin, U: 23.2 %
Alpha 2, Urine: 15.7 %
Free Kappa Lt Chains,Ur: 23.15 mg/L (ref 1.17–86.46)
Free Kappa/Lambda Ratio: 7.23 (ref 1.83–14.26)
Free Lambda Lt Chains,Ur: 3.2 mg/L (ref 0.27–15.21)
GAMMA GLOBULIN URINE: 25.8 %
Total Protein, Urine-Ur/day: 69 mg/(24.h) (ref 30–150)
Total Protein, Urine: 5.5 mg/dL
Total Volume: 1250

## 2023-04-21 ENCOUNTER — Ambulatory Visit: Payer: Medicare HMO | Admitting: Obstetrics and Gynecology

## 2023-04-23 ENCOUNTER — Inpatient Hospital Stay: Payer: Medicare HMO | Admitting: Oncology

## 2023-04-30 ENCOUNTER — Other Ambulatory Visit: Payer: Self-pay | Admitting: Oncology

## 2023-05-07 ENCOUNTER — Inpatient Hospital Stay: Payer: Medicare HMO | Admitting: Oncology

## 2023-05-08 ENCOUNTER — Inpatient Hospital Stay: Payer: Medicare HMO

## 2023-05-08 ENCOUNTER — Inpatient Hospital Stay: Payer: Medicare HMO | Attending: Oncology | Admitting: Oncology

## 2023-05-08 ENCOUNTER — Encounter: Payer: Self-pay | Admitting: Oncology

## 2023-05-08 VITALS — BP 121/43 | HR 55 | Temp 98.7°F | Resp 18 | Wt 142.0 lb

## 2023-05-08 DIAGNOSIS — R768 Other specified abnormal immunological findings in serum: Secondary | ICD-10-CM | POA: Insufficient documentation

## 2023-05-08 DIAGNOSIS — I4891 Unspecified atrial fibrillation: Secondary | ICD-10-CM | POA: Diagnosis not present

## 2023-05-08 DIAGNOSIS — D649 Anemia, unspecified: Secondary | ICD-10-CM | POA: Insufficient documentation

## 2023-05-08 DIAGNOSIS — E538 Deficiency of other specified B group vitamins: Secondary | ICD-10-CM | POA: Insufficient documentation

## 2023-05-08 DIAGNOSIS — I10 Essential (primary) hypertension: Secondary | ICD-10-CM | POA: Diagnosis not present

## 2023-05-08 LAB — SEDIMENTATION RATE: Sed Rate: 5 mm/h (ref 0–22)

## 2023-05-08 NOTE — Patient Instructions (Signed)
 VISIT SUMMARY:  During today's visit, we discussed your ongoing anemia, joint pain, and osteoporosis. We reviewed your current symptoms and made plans for further evaluation and management.  YOUR PLAN:  -CHRONIC ANEMIA: Anemia is a condition where you do not have enough healthy red blood cells to carry adequate oxygen to your body's tissues. Despite taking iron  and B12 supplements, your anemia persists. We will order additional blood tests to check for conditions like lupus or other inflammatory issues. If these tests do not provide answers, we may consider a bone marrow biopsy.  -JOINT PAIN: You have chronic joint pain, especially in the evenings, affecting your fingers and legs. We will continue with your current management plan for this issue.   -GENERAL HEALTH MAINTENANCE: Your last colonoscopy was 8 years ago, and you are due for your next one in 2027. Continue with your current follow-up plan.  INSTRUCTIONS:  Please follow up in 3 months. We will contact you if there are any concerning results from your blood work.

## 2023-05-08 NOTE — Assessment & Plan Note (Signed)
 Patient has a history of A-fib and is not on anticoagulation.  Reports occasional fatigue due to A-fib -Continue to follow with cardiology

## 2023-05-08 NOTE — Progress Notes (Signed)
 [facility] at [facility] HEMATOLOGY FOLLOW-UP VISIT  Center, [facility]  REASON FOR FOLLOW-UP: Anemia  ASSESSMENT & PLAN:  Patient is a 63 year old female with hemorrhagic stroke, hypertension, A-fib not on anticoagulation came for follow-up for her anemia .  Anemia Anemia likely multifactorial at this time.  Iron  and B12 deficiency previously noted.  SPEP showed no M spike.  Slight elevation of kappa free light chains. -Will workup for autoimmune diseases as kappa free light chains can be elevated and that. -Will repeat labs in 2 months and if no improvement in hemoglobin, and all the above workup is negative, will consider doing a bone marrow biopsy -Continue iron  and vitamin B12 supplementation  Return to clinic in 2 months  Elevated serum immunoglobulin free light chains Patient has slightly elevated kappa free light chains with normal FLC ratio.  Normal kidney function.  Patient also reports some small joint pains especially in the mornings. -Will workup for autoimmune diseases  A-fib [facility]) Patient has a history of A-fib and is not on anticoagulation.  Reports occasional fatigue due to A-fib -Continue to follow with cardiology   Orders Placed This Encounter  Procedures   Sedimentation rate    Standing Status:   Future    Number of Occurrences:   1    Expected Date:   05/08/2023    Expiration Date:   05/07/2024   Erythropoietin     Standing Status:   Future    Number of Occurrences:   1    Expected Date:   05/08/2023    Expiration Date:   05/07/2024   ANA, IFA (with reflex)    Standing Status:   Future    Number of Occurrences:   1    Expected Date:   05/08/2023    Expiration Date:   05/07/2024   Rheumatoid factor    Standing Status:   Future    Number of Occurrences:   1    Expected Date:   05/08/2023    Expiration Date:   05/07/2024   Cyclic Citrul Peptide Antibody, IGG/IGA    Standing Status:   Future    Number of Occurrences:   1    Expected  Date:   05/08/2023    Expiration Date:   05/07/2024   CBC with Differential/Platelet    Standing Status:   Future    Expected Date:   08/04/2023    Expiration Date:   05/07/2024   Comprehensive metabolic panel    Standing Status:   Future    Expected Date:   08/04/2023    Expiration Date:   05/07/2024   Ferritin    Standing Status:   Future    Expected Date:   08/04/2023    Expiration Date:   05/07/2024   Folate    Standing Status:   Future    Expected Date:   08/04/2023    Expiration Date:   05/07/2024   Vitamin B12    Standing Status:   Future    Expected Date:   08/04/2023    Expiration Date:   05/07/2024   Multiple Myeloma Panel (SPEP&IFE w/QIG)    Standing Status:   Future    Expected Date:   08/04/2023    Expiration Date:   05/07/2024   Kappa/lambda light chains    Standing Status:   Future    Expected Date:   08/04/2023    Expiration Date:   05/07/2024   Iron  and TIBC  Standing Status:   Future    Expected Date:   08/04/2023    Expiration Date:   05/07/2024   Lactate dehydrogenase    Standing Status:   Future    Expected Date:   08/04/2023    Expiration Date:   05/07/2024   Ferritin    Standing Status:   Future    Expected Date:   07/07/2023    Expiration Date:   05/07/2024   Folate    Standing Status:   Future    Expected Date:   07/07/2023    Expiration Date:   05/07/2024   Vitamin B12    Standing Status:   Future    Expected Date:   07/07/2023    Expiration Date:   05/07/2024   Comprehensive metabolic panel    Standing Status:   Future    Expected Date:   07/07/2023    Expiration Date:   05/07/2024   CBC with Differential/Platelet    Standing Status:   Future    Expected Date:   07/07/2023    Expiration Date:   05/07/2024   Iron  and TIBC    Standing Status:   Future    Expected Date:   07/07/2023    Expiration Date:   05/07/2024   Kappa/lambda light chains    Standing Status:   Future    Expected Date:   07/07/2023    Expiration Date:   05/07/2024    The total time spent in the appointment was 20 minutes  encounter with patients including review of chart and various tests results, discussions about plan of care and coordination of care plan   All questions were answered. The patient knows to call the clinic with any problems, questions or concerns. No barriers to learning was detected.  Mickiel Dry, MD 2/6/20251:13 PM   INTERVAL HISTORY: Tynia Wiers Supinski 63 y.o. female is here for follow-up for anemia.  Patient is doing very well today and has no complaints.    The patient denies any recent changes, abdominal pain, weight loss, or fatigue. She also reports long-standing joint pains, particularly in the back due to osteoporosis, and in the small joints of the fingers and legs, which often 'lock up' or cramp, mostly in the evenings and sometimes in the mornings. The patient denies any rashes, fevers, or abdominal pain. She has not sought any specific treatment for these joint pains.  I have reviewed the past medical history, past surgical history, social history and family history with the patient   ALLERGIES:  is allergic to bee venom.  MEDICATIONS:  Current Outpatient Medications  Medication Sig Dispense Refill   escitalopram  (LEXAPRO ) 20 MG tablet Take 1 tablet by mouth daily.     pregabalin  (LYRICA ) 50 MG capsule Take 50 mg by mouth 3 (three) times daily.     alendronate (FOSAMAX) 70 MG tablet Take 70 mg by mouth every 7 (seven) days. Takes on Sunday. Take with a full glass of water on an empty stomach.     aspirin  EC 81 MG tablet Take 81 mg by mouth daily. Swallow whole.     atorvastatin  (LIPITOR) 10 MG tablet Take 10 mg by mouth at bedtime.     Black Cohosh  540 MG CAPS Take 1 capsule by mouth daily.     calcium  carbonate (OSCAL) 1500 (600 Ca) MG TABS tablet Take 1 tablet by mouth daily.     [employer] CALCIUM  600+D 600-800 MG-UNIT TABS Take 1 tablet by mouth daily.  11  cyanocobalamin  (VITAMIN B12) 1000 MCG tablet TAKE 1 TABLET EVERY DAY 90 tablet 3   Ferrous Sulfate  (IRON  PO) Take 1  tablet by mouth daily.     ferrous sulfate  325 (65 FE) MG EC tablet Take 1 tablet (325 mg total) by mouth every other day. 45 tablet 3   HYDROcodone -acetaminophen  (NORCO) 10-325 MG tablet Take 1 tablet by mouth every 6 (six) hours as needed.     magnesium  oxide (MAGOX 400) 400 (241.3 Mg) MG tablet Take 1 tablet (400 mg total) by mouth daily. 30 tablet 1   naproxen (NAPROSYN) 500 MG tablet Take 500 mg by mouth daily.     ondansetron  (ZOFRAN ) 4 MG tablet Take 1 tablet (4 mg total) by mouth every 6 (six) hours. 12 tablet 0   pantoprazole  (PROTONIX ) 40 MG tablet Take 1 tablet (40 mg total) by mouth 2 (two) times daily. 60 tablet 2   potassium chloride  SA (KLOR-CON ) 20 MEQ tablet Take 1 tablet (20 mEq total) by mouth daily. 30 tablet 1   sotalol  (BETAPACE ) 120 MG tablet Take 0.5 tablets (60 mg total) by mouth daily.     topiramate  (TOPAMAX ) 50 MG tablet Take 50 mg by mouth daily.     No current facility-administered medications for this visit.     REVIEW OF SYSTEMS:   Constitutional: Denies fevers, chills or night sweats Eyes: Denies blurriness of vision Ears, nose, mouth, throat, and face: Denies mucositis or sore throat Respiratory: Denies cough, dyspnea or wheezes Cardiovascular: Denies palpitation, chest discomfort or lower extremity swelling Gastrointestinal:  Denies nausea, heartburn or change in bowel habits Skin: Denies abnormal skin rashes Lymphatics: Denies new lymphadenopathy or easy bruising Neurological:Denies numbness, tingling or new weaknesses Behavioral/Psych: Mood is stable, no new changes  All other systems were reviewed with the patient and are negative.  PHYSICAL EXAMINATION:   Vitals:   05/08/23 0835  BP: (!) 121/43  Pulse: (!) 55  Resp: 18  Temp: 98.7 F (37.1 C)  SpO2: 99%    GENERAL:alert, no distress and comfortable LUNGS: clear to auscultation and percussion with normal breathing effort HEART: regular rate & rhythm and no murmurs and no lower extremity  edema ABDOMEN:abdomen soft, non-tender and normal bowel sounds Musculoskeletal:no cyanosis of digits and no clubbing  NEURO: alert & oriented x 3 with fluent speech  LABORATORY DATA:  I have reviewed the data as listed  Lab Results  Component Value Date   WBC 9.7 04/10/2023   NEUTROABS 5.3 04/10/2023   HGB 10.7 (L) 04/10/2023   HCT 33.6 (L) 04/10/2023   MCV 97.1 04/10/2023   PLT 317 04/10/2023      Chemistry      Component Value Date/Time   NA 136 04/10/2023 1000   K 3.4 (L) 04/10/2023 1000   CL 104 04/10/2023 1000   CO2 23 04/10/2023 1000   BUN 17 04/10/2023 1000   CREATININE 0.88 04/10/2023 1000      Component Value Date/Time   CALCIUM  8.7 (L) 04/10/2023 1000   ALKPHOS 44 04/10/2023 1000   AST 25 04/10/2023 1000   ALT 21 04/10/2023 1000   BILITOT 0.4 04/10/2023 1000     Lab Results  Component Value Date   IRON  98 04/10/2023   TIBC 333 04/10/2023   FERRITIN 54 04/10/2023    Latest Reference Range & Units 12/18/15 09:25 01/23/23 11:53 02/07/23 09:38 02/07/23 09:41 04/10/23 10:01  Beta-2  Microglobulin 0.6 - 2.4 mg/L   2.2    Copper  80 - 158 ug/dL  98    CRP <8.0 mg/L 0.5      Vitamin B12 180 - 914 pg/mL  436  394 1,339 (H)  Zinc  44 - 115 ug/dL   55    (H): Data is abnormally high   Latest Reference Range & Units 02/07/23 09:38 04/10/23 10:00  Kappa free light chain 3.3 - 19.4 mg/L 39.1 (H) 25.2 (H)  Lambda free light chains 5.7 - 26.3 mg/L 26.0 18.2  Kappa, lambda light chain ratio 0.26 - 1.65  1.50 1.38  (H): Data is abnormally high  Latest Reference Range & Units 04/10/23 10:10  ALBUMIN, U % 23.2  ALPHA 1 URINE % 3.8  Alpha 2, Urine % 15.7  % BETA, Urine % 31.5  Free Kappa Lt Chains,Ur 1.17 - 86.46 mg/L 23.15  Free Kappa/Lambda Ratio 1.83 - 14.26  7.23 (C)  Free Lambda Lt Chains,Ur 0.27 - 15.21 mg/L 3.20 (C)  GAMMA GLOBULIN URINE % 25.8  Immunofixation Result, Urine  Comment (C)  M-SPIKE %, Urine Not Observed % Not Observed (C)  Total Protein,  Urine-Ur/day 30 - 150 mg/24 hr 69  Total Protein, Urine-UPE24 Not Estab. mg/dL 5.5  (C): Corrected

## 2023-05-08 NOTE — Assessment & Plan Note (Signed)
 Anemia likely multifactorial at this time.  Iron  and B12 deficiency previously noted.  SPEP showed no M spike.  Slight elevation of kappa free light chains. -Will workup for autoimmune diseases as kappa free light chains can be elevated and that. -Will repeat labs in 2 months and if no improvement in hemoglobin, and all the above workup is negative, will consider doing a bone marrow biopsy -Continue iron  and vitamin B12 supplementation  Return to clinic in 2 months

## 2023-05-08 NOTE — Assessment & Plan Note (Signed)
 Patient has slightly elevated kappa free light chains with normal FLC ratio.  Normal kidney function.  Patient also reports some small joint pains especially in the mornings. -Will workup for autoimmune diseases

## 2023-05-09 LAB — CYCLIC CITRUL PEPTIDE ANTIBODY, IGG/IGA: CCP Antibodies IgG/IgA: 4 U (ref 0–19)

## 2023-05-09 LAB — RHEUMATOID FACTOR: Rheumatoid fact SerPl-aCnc: <10 [IU]/mL (ref <14.0)

## 2023-05-09 LAB — ERYTHROPOIETIN: Erythropoietin: 14.9 m[IU]/mL (ref 2.6–18.5)

## 2023-05-14 LAB — ANTINUCLEAR ANTIBODIES, IFA: ANA Ab, IFA: NEGATIVE

## 2023-05-16 ENCOUNTER — Telehealth: Payer: Self-pay

## 2023-05-16 ENCOUNTER — Ambulatory Visit (INDEPENDENT_AMBULATORY_CARE_PROVIDER_SITE_OTHER): Payer: Medicare HMO | Admitting: Obstetrics and Gynecology

## 2023-05-16 ENCOUNTER — Encounter: Payer: Self-pay | Admitting: Obstetrics and Gynecology

## 2023-05-16 VITALS — BP 124/60 | HR 53 | Temp 97.7°F | Wt 142.0 lb

## 2023-05-16 DIAGNOSIS — R339 Retention of urine, unspecified: Secondary | ICD-10-CM | POA: Diagnosis not present

## 2023-05-16 DIAGNOSIS — N813 Complete uterovaginal prolapse: Secondary | ICD-10-CM

## 2023-05-16 LAB — URINALYSIS, COMPLETE W/RFL CULTURE
Bacteria, UA: NONE SEEN /[HPF]
Bilirubin Urine: NEGATIVE
Glucose, UA: NEGATIVE
Hgb urine dipstick: NEGATIVE
Hyaline Cast: NONE SEEN /[LPF]
Ketones, ur: NEGATIVE
Leukocyte Esterase: NEGATIVE
Nitrites, Initial: NEGATIVE
Protein, ur: NEGATIVE
RBC / HPF: NONE SEEN /[HPF] (ref 0–2)
Specific Gravity, Urine: 1.02 (ref 1.001–1.035)
WBC, UA: NONE SEEN /[HPF] (ref 0–5)
pH: 5.5 (ref 5.0–8.0)

## 2023-05-16 LAB — NO CULTURE INDICATED

## 2023-05-16 NOTE — Telephone Encounter (Signed)
Per GH:  "Can offer her a visit for next week."  Pt notified and voiced understanding. Scheduled for 05/19/2023.  Encounter closed.

## 2023-05-16 NOTE — Telephone Encounter (Signed)
Pt LVM in triage line requesting a cb before we close today. Stated everything is ok, just desires to speak to someone.   Pt reports that her pessary has come out.  Asking for advice on what she should do?

## 2023-05-16 NOTE — Progress Notes (Signed)
63 y.o. G50P1001 female with A-fib, prior subarachnoid hemorrhage (2018) here for referral for pelvic organ prolapse here for pessary fitting. Married. Presents with husband.  No LMP recorded. Patient is postmenopausal.   Pelvic pain, prolapse is worse. Pt will have pacemaker placed next month for Afib. Still having a difficulty with complete emptying.  At 04/09/23 appt, she reported: "She reports pelvic bulge since summer 2024.  It is bothersome at times but it is not painful.  She has started having less intercourse due to the prolapse.  She reports difficulty emptying her bladder sometimes.  She denies urinary leakage, constipation or diarrhea.  No vaginal bleeding.  Sexually active: occasionally"  GYN HISTORY: No significant history   OB History  Gravida Para Term Preterm AB Living  1 1 1   1   SAB IAB Ectopic Multiple Live Births          # Outcome Date GA Lbr Len/2nd Weight Sex Type Anes PTL Lv  1 Term             Past Medical History:  Diagnosis Date   Anxiety    Arthritis    Asthma    Atrial fibrillation (HCC)    Chronic headaches    Chronic neck pain    Depression 2002   Dysconjugate gaze    GERD (gastroesophageal reflux disease)    Hypertension    Neuromuscular disorder (HCC)    Osteoporosis    Panic attacks 2002   Stroke [facility])     Past Surgical History:  Procedure Laterality Date   BACK SURGERY     x4   CERVICAL SPINE SURGERY     plate in neck   CHOLECYSTECTOMY     KNEE ARTHROSCOPY WITH MEDIAL MENISECTOMY Left 03/09/2014   Procedure: LEFT KNEE ARTHROSCOPY WITH PARTIAL MEDIAL MENISECTOMY;  Surgeon: Vickki Hearing, MD;  Location: [facility];  Service: Orthopedics;  Laterality: Left;   SHOULDER ARTHROSCOPY Left 11/14/2016   Procedure: ARTHROSCOPY SHOULDER WITH LIMITED DEDBRIDEMENT AND ACROMIOPLASTY AND DISTAL CLAVICAL EXCISION;  Surgeon: Vickki Hearing, MD;  Location: [facility];  Service: Orthopedics;  Laterality: Left;   TOTAL KNEE ARTHROPLASTY Left  09/14/2014   Procedure: LEFT TOTAL KNEE ARTHROPLASTY;  Surgeon: Vickki Hearing, MD;  Location: [facility];  Service: Orthopedics;  Laterality: Left;    Current Outpatient Medications on File Prior to Visit  Medication Sig Dispense Refill   alendronate (FOSAMAX) 70 MG tablet Take 70 mg by mouth every 7 (seven) days. Takes on Sunday. Take with a full glass of water on an empty stomach.     aspirin EC 81 MG tablet Take 81 mg by mouth daily. Swallow whole.     atorvastatin (LIPITOR) 10 MG tablet Take 10 mg by mouth at bedtime.     Black Cohosh 540 MG CAPS Take 1 capsule by mouth daily.     calcium carbonate (OSCAL) 1500 (600 Ca) MG TABS tablet Take 1 tablet by mouth daily.     [employer] CALCIUM 600+D 600-800 MG-UNIT TABS Take 1 tablet by mouth daily.  11   cyanocobalamin (VITAMIN B12) 1000 MCG tablet TAKE 1 TABLET EVERY DAY 90 tablet 3   escitalopram (LEXAPRO) 20 MG tablet Take 1 tablet by mouth daily.     Ferrous Sulfate (IRON PO) Take 1 tablet by mouth daily.     ferrous sulfate 325 (65 FE) MG EC tablet Take 1 tablet (325 mg total) by mouth every other day. 45 tablet 3   HYDROcodone-acetaminophen (NORCO)  10-325 MG tablet Take 1 tablet by mouth every 6 (six) hours as needed.     magnesium oxide (MAGOX 400) 400 (241.3 Mg) MG tablet Take 1 tablet (400 mg total) by mouth daily. 30 tablet 1   naproxen (NAPROSYN) 500 MG tablet Take 500 mg by mouth daily.     ondansetron (ZOFRAN) 4 MG tablet Take 1 tablet (4 mg total) by mouth every 6 (six) hours. 12 tablet 0   pantoprazole (PROTONIX) 40 MG tablet Take 1 tablet (40 mg total) by mouth 2 (two) times daily. 60 tablet 2   potassium chloride SA (KLOR-CON) 20 MEQ tablet Take 1 tablet (20 mEq total) by mouth daily. 30 tablet 1   pregabalin (LYRICA) 50 MG capsule Take 50 mg by mouth 3 (three) times daily.     sotalol (BETAPACE) 120 MG tablet Take 0.5 tablets (60 mg total) by mouth daily.     topiramate (TOPAMAX) 50 MG tablet Take 50 mg by mouth daily.     No  current facility-administered medications on file prior to visit.    Allergies  Allergen Reactions   Bee Venom Swelling    Causes bad swelling. No epipen  needed      PE Today's Vitals   05/16/23 0916  BP: 124/60  Pulse: (!) 53  Temp: 97.7 F (36.5 C)  TempSrc: Oral  SpO2: 98%  Weight: 142 lb (64.4 kg)   Body mass index is 26.83 kg/m.  Physical Exam Vitals reviewed. Exam conducted with a chaperone present.  Constitutional:      General: She is not in acute distress.    Appearance: Normal appearance.  HENT:     Head: Normocephalic and atraumatic.     Nose: Nose normal.  Eyes:     Extraocular Movements: Extraocular movements intact.     Conjunctiva/sclera: Conjunctivae normal.  Pulmonary:     Effort: Pulmonary effort is normal.  Genitourinary:    General: Normal vulva.     Exam position: Lithotomy position.     Vagina: Normal. No vaginal discharge.     Cervix: Normal. No cervical motion tenderness, discharge or lesion.     Uterus: Normal. Not enlarged and not tender.      Adnexa: Right adnexa normal and left adnexa normal.  Musculoskeletal:        General: Normal range of motion.     Cervical back: Normal range of motion.  Neurological:     General: No focal deficit present.     Mental Status: She is alert.  Psychiatric:        Mood and Affect: Mood normal.        Behavior: Behavior normal.     Bladder catheterization: Bladder catheterization was performed after prepping the urethral opening with betadine with a flexible 14 fr catheter. 90cc was emptied from the bladder. Specimen was collected for UA.  Pessary Fitting: After performing speculum exam, fitting was started with # ring with support using surgical lubricant. Pessary remained well positioned with valsalva. Patient was instructed to ambulate and attempt to void (successful). Pessary remained well positioned and was comfortable for the patient. Did note some external discomfort from  placement. Fitting pessary was removed and replaced with new #2 ring pessary without support. Successful fitting. Tolerated well.   Assessment and Plan:        Complete uterine prolapse -     PR PESSARY, NON RUBBER,ANY TYPE  Urinary retention -     Urinalysis,Complete w/RFL Culture  Successful pessary fitting. RTO  in 3 months for cleaning. Still recommend PFPT for PR weakness  Rosalyn Gess, MD

## 2023-05-19 ENCOUNTER — Ambulatory Visit (INDEPENDENT_AMBULATORY_CARE_PROVIDER_SITE_OTHER): Payer: Medicare HMO | Admitting: Obstetrics and Gynecology

## 2023-05-19 ENCOUNTER — Encounter: Payer: Self-pay | Admitting: Obstetrics and Gynecology

## 2023-05-19 VITALS — BP 126/64 | HR 54 | Temp 97.7°F | Wt 141.0 lb

## 2023-05-19 DIAGNOSIS — K5900 Constipation, unspecified: Secondary | ICD-10-CM

## 2023-05-19 DIAGNOSIS — N813 Complete uterovaginal prolapse: Secondary | ICD-10-CM | POA: Diagnosis not present

## 2023-05-19 NOTE — Progress Notes (Signed)
63 y.o. G76P1001 female with A-fib, prior subarachnoid hemorrhage (2018) here for referral for pelvic organ prolapse here for pessary problem: #2 ring pessary without support.. Married. Presents with husband.  No LMP recorded. Patient is postmenopausal.  At 04/09/23 appt, she reported: "She reports pelvic bulge since summer 2024.  It is bothersome at times but it is not painful.  She has started having less intercourse due to the prolapse.  She reports difficulty emptying her bladder sometimes.  She denies urinary leakage, constipation or diarrhea.  No vaginal bleeding. Sexually active: occasionally"  At 05/16/23 appt, she reported: "Pelvic pain, prolapse is worse. Pt will have pacemaker placed next month for Afib. Still having a difficulty with complete emptying." Ring pessary #2 fitted and placed at appointment.  Today, patient reports pessary came half way out during a bowel movement. Pt then pulled it out. Husband reports significant grunting with BM. +constipation. Poor PO water intake.  GYN HISTORY: No significant history   OB History  Gravida Para Term Preterm AB Living  1 1 1   1   SAB IAB Ectopic Multiple Live Births          # Outcome Date GA Lbr Len/2nd Weight Sex Type Anes PTL Lv  1 Term             Past Medical History:  Diagnosis Date   Anxiety    Arthritis    Asthma    Atrial fibrillation (HCC)    Chronic headaches    Chronic neck pain    Depression 2002   Dysconjugate gaze    GERD (gastroesophageal reflux disease)    Hypertension    Neuromuscular disorder (HCC)    Osteoporosis    Panic attacks 2002   Stroke [facility])     Past Surgical History:  Procedure Laterality Date   BACK SURGERY     x4   CERVICAL SPINE SURGERY     plate in neck   CHOLECYSTECTOMY     KNEE ARTHROSCOPY WITH MEDIAL MENISECTOMY Left 03/09/2014   Procedure: LEFT KNEE ARTHROSCOPY WITH PARTIAL MEDIAL MENISECTOMY;  Surgeon: Vickki Hearing, MD;  Location: [facility];  Service:  Orthopedics;  Laterality: Left;   SHOULDER ARTHROSCOPY Left 11/14/2016   Procedure: ARTHROSCOPY SHOULDER WITH LIMITED DEDBRIDEMENT AND ACROMIOPLASTY AND DISTAL CLAVICAL EXCISION;  Surgeon: Vickki Hearing, MD;  Location: [facility];  Service: Orthopedics;  Laterality: Left;   TOTAL KNEE ARTHROPLASTY Left 09/14/2014   Procedure: LEFT TOTAL KNEE ARTHROPLASTY;  Surgeon: Vickki Hearing, MD;  Location: [facility];  Service: Orthopedics;  Laterality: Left;    Current Outpatient Medications on File Prior to Visit  Medication Sig Dispense Refill   alendronate (FOSAMAX) 70 MG tablet Take 70 mg by mouth every 7 (seven) days. Takes on Sunday. Take with a full glass of water on an empty stomach.     aspirin EC 81 MG tablet Take 81 mg by mouth daily. Swallow whole.     atorvastatin (LIPITOR) 10 MG tablet Take 10 mg by mouth at bedtime.     Black Cohosh 540 MG CAPS Take 1 capsule by mouth daily.     calcium carbonate (OSCAL) 1500 (600 Ca) MG TABS tablet Take 1 tablet by mouth daily.     [employer] CALCIUM 600+D 600-800 MG-UNIT TABS Take 1 tablet by mouth daily.  11   cyanocobalamin (VITAMIN B12) 1000 MCG tablet TAKE 1 TABLET EVERY DAY 90 tablet 3   escitalopram (LEXAPRO) 20 MG tablet Take 1 tablet by mouth daily.  Ferrous Sulfate (IRON PO) Take 1 tablet by mouth daily.     ferrous sulfate 325 (65 FE) MG EC tablet Take 1 tablet (325 mg total) by mouth every other day. 45 tablet 3   HYDROcodone-acetaminophen (NORCO) 10-325 MG tablet Take 1 tablet by mouth every 6 (six) hours as needed.     magnesium oxide (MAGOX 400) 400 (241.3 Mg) MG tablet Take 1 tablet (400 mg total) by mouth daily. 30 tablet 1   naproxen (NAPROSYN) 500 MG tablet Take 500 mg by mouth daily.     ondansetron (ZOFRAN) 4 MG tablet Take 1 tablet (4 mg total) by mouth every 6 (six) hours. 12 tablet 0   pantoprazole (PROTONIX) 40 MG tablet Take 1 tablet (40 mg total) by mouth 2 (two) times daily. 60 tablet 2   potassium chloride SA (KLOR-CON) 20 MEQ  tablet Take 1 tablet (20 mEq total) by mouth daily. 30 tablet 1   pregabalin (LYRICA) 50 MG capsule Take 50 mg by mouth 3 (three) times daily.     sotalol (BETAPACE) 120 MG tablet Take 0.5 tablets (60 mg total) by mouth daily.     topiramate (TOPAMAX) 50 MG tablet Take 50 mg by mouth daily.     No current facility-administered medications on file prior to visit.    Allergies  Allergen Reactions   Bee Venom Swelling    Causes bad swelling. No epipen  needed      PE Today's Vitals   05/19/23 1045  BP: 126/64  Pulse: (!) 54  Temp: 97.7 F (36.5 C)  TempSrc: Oral  SpO2: 98%  Weight: 141 lb (64 kg)   Body mass index is 26.64 kg/m.  Physical Exam Vitals reviewed.  Constitutional:      General: She is not in acute distress.    Appearance: Normal appearance.  HENT:     Head: Normocephalic and atraumatic.     Nose: Nose normal.  Eyes:     Extraocular Movements: Extraocular movements intact.     Conjunctiva/sclera: Conjunctivae normal.  Pulmonary:     Effort: Pulmonary effort is normal.  Genitourinary:    General: Normal vulva.     Exam position: Lithotomy position.  Musculoskeletal:        General: Normal range of motion.     Cervical back: Normal range of motion.  Neurological:     General: No focal deficit present.     Mental Status: She is alert.  Psychiatric:        Mood and Affect: Mood normal.        Behavior: Behavior normal.     With instruction, patient able to successfully place pessary on her own.  Assessment and Plan:        Complete uterine prolapse Assessment & Plan: Discussed uterine prolapse, Grade 4. Prolapse is associated with urinary dysfunction-sometimes feels that she is retaining. PVR normal x2.   Successful pessary fitting on 05/16/23 #2 ring pessary without support. However, pessary was expelled with constipated BM. Patient educated on how to reinsert pessary today and able to insert with instruction Also discussed splinting and  constipation management RTO in 3 months for f/u and discuss home vs office management of pessary at that time    Constipation, unspecified constipation type  Still recommend PFPT for PR weakness Recommend increasing hydration, fiber intake, and use of stool softeners.   Rosalyn Gess, MD

## 2023-05-19 NOTE — Patient Instructions (Signed)
Recommend increasing hydration, fiber intake, and use of stool softeners (like [id number], miralax or colace).

## 2023-05-19 NOTE — Assessment & Plan Note (Signed)
Discussed uterine prolapse, Grade 4. Prolapse is associated with urinary dysfunction-sometimes feels that she is retaining. PVR normal x2.   Successful pessary fitting on 05/16/23 #2 ring pessary without support. However, pessary was expelled with constipated BM. Patient educated on how to reinsert pessary today and able to insert with instruction Also discussed splinting and constipation management RTO in 3 months for f/u and discuss home vs office management of pessary at that time

## 2023-06-12 DIAGNOSIS — Z950 Presence of cardiac pacemaker: Secondary | ICD-10-CM | POA: Insufficient documentation

## 2023-07-24 ENCOUNTER — Other Ambulatory Visit: Payer: Self-pay

## 2023-07-24 ENCOUNTER — Encounter (HOSPITAL_COMMUNITY): Payer: Self-pay

## 2023-07-24 ENCOUNTER — Emergency Department (HOSPITAL_COMMUNITY)
Admission: EM | Admit: 2023-07-24 | Discharge: 2023-07-24 | Disposition: A | Discharge status: Home / Self Care | Attending: Emergency Medicine | Admitting: Emergency Medicine

## 2023-07-24 ENCOUNTER — Emergency Department (HOSPITAL_COMMUNITY)

## 2023-07-24 DIAGNOSIS — S80212A Abrasion, left knee, initial encounter: Secondary | ICD-10-CM | POA: Diagnosis not present

## 2023-07-24 DIAGNOSIS — S52571A Other intraarticular fracture of lower end of right radius, initial encounter for closed fracture: Secondary | ICD-10-CM | POA: Diagnosis not present

## 2023-07-24 DIAGNOSIS — Z7982 Long term (current) use of aspirin: Secondary | ICD-10-CM | POA: Diagnosis not present

## 2023-07-24 DIAGNOSIS — G9389 Other specified disorders of brain: Secondary | ICD-10-CM | POA: Insufficient documentation

## 2023-07-24 DIAGNOSIS — M25531 Pain in right wrist: Secondary | ICD-10-CM | POA: Insufficient documentation

## 2023-07-24 DIAGNOSIS — W01198A Fall on same level from slipping, tripping and stumbling with subsequent striking against other object, initial encounter: Secondary | ICD-10-CM | POA: Diagnosis not present

## 2023-07-24 DIAGNOSIS — S0081XA Abrasion of other part of head, initial encounter: Secondary | ICD-10-CM | POA: Diagnosis not present

## 2023-07-24 DIAGNOSIS — S80211A Abrasion, right knee, initial encounter: Secondary | ICD-10-CM | POA: Diagnosis not present

## 2023-07-24 DIAGNOSIS — W19XXXA Unspecified fall, initial encounter: Secondary | ICD-10-CM

## 2023-07-24 DIAGNOSIS — S6991XA Unspecified injury of right wrist, hand and finger(s), initial encounter: Secondary | ICD-10-CM | POA: Diagnosis present

## 2023-07-24 MED ORDER — OXYCODONE-ACETAMINOPHEN 5-325 MG PO TABS
2.0000 | ORAL_TABLET | Freq: Once | ORAL | Status: DC
  Filled 2023-07-24: qty 2

## 2023-07-24 MED ORDER — HYDROCODONE-ACETAMINOPHEN 5-325 MG PO TABS
2.0000 | ORAL_TABLET | Freq: Once | ORAL | Status: AC
  Administered 2023-07-24: 2 via ORAL
  Filled 2023-07-24: qty 2

## 2023-07-24 NOTE — ED Provider Notes (Signed)
 [place] EMERGENCY DEPARTMENT AT [facility] Provider Note   CSN: [id number] Arrival date & time: 07/24/23  1543     History Chief Complaint  Patient presents with   Alyssa Poole    Alyssa Poole is a 63 y.o. female patient who presents to the emergency department today for further evaluation of a mechanical trip and fall.  She states that she tripped and fell forward on the pavement.  She did strike her face and does endorse some pain to the right wrist.  She denies any anticoagulation, loss of consciousness.  Patient did fall forward via FOOSH mechanism.   Fall       Home Medications Prior to Admission medications   Medication Sig Start Date End Date Taking? Authorizing Provider  alendronate (FOSAMAX) 70 MG tablet Take 70 mg by mouth every 7 (seven) days. Takes on Sunday. Take with a full glass of water on an empty stomach.    [provider]  aspirin  EC 81 MG tablet Take 81 mg by mouth daily. Swallow whole.    [provider]  atorvastatin  (LIPITOR) 10 MG tablet Take 10 mg by mouth at bedtime. 07/23/22   [provider]  Black Cohosh  540 MG CAPS Take 1 capsule by mouth daily.    [provider]  calcium  carbonate (OSCAL) 1500 (600 Ca) MG TABS tablet Take 1 tablet by mouth daily. 02/06/23   [provider]  [employer] CALCIUM  600+D 600-800 MG-UNIT TABS Take 1 tablet by mouth daily. 10/08/15   [provider]  cyanocobalamin  (VITAMIN B12) 1000 MCG tablet TAKE 1 TABLET EVERY DAY 05/01/23   Eduardo Grade, MD  escitalopram  (LEXAPRO ) 20 MG tablet Take 1 tablet by mouth daily. 03/07/23   [provider]  Ferrous Sulfate  (IRON PO) Take 1 tablet by mouth daily. 01/30/17   [provider]  ferrous sulfate  325 (65 FE) MG EC tablet Take 1 tablet (325 mg total) by mouth every other day. 02/07/23   Kandala, Hyndavi, MD  HYDROcodone -acetaminophen  (NORCO) 10-325 MG tablet Take 1 tablet by mouth every 6 (six) hours as needed.     [provider]  magnesium  oxide (MAGOX 400) 400 (241.3 Mg) MG tablet Take 1 tablet (400 mg total) by mouth daily. 06/20/20   Justina Oman, MD  naproxen (NAPROSYN) 500 MG tablet Take 500 mg by mouth daily.    [provider]  ondansetron  (ZOFRAN ) 4 MG tablet Take 1 tablet (4 mg total) by mouth every 6 (six) hours. 10/13/22   Robinson, John K, PA-C  pantoprazole  (PROTONIX ) 40 MG tablet Take 1 tablet (40 mg total) by mouth 2 (two) times daily. 06/20/20   Justina Oman, MD  potassium chloride  SA (KLOR-CON ) 20 MEQ tablet Take 1 tablet (20 mEq total) by mouth daily. 06/21/20   Justina Oman, MD  pregabalin  (LYRICA ) 50 MG capsule Take 50 mg by mouth 3 (three) times daily. 04/08/23   [provider]  sotalol  (BETAPACE ) 120 MG tablet Take 0.5 tablets (60 mg total) by mouth daily. 10/18/22   Johnson, Clanford L, MD  topiramate  (TOPAMAX ) 50 MG tablet Take 50 mg by mouth daily.    [provider]      Allergies    Bee venom    Review of Systems   Review of Systems  All other systems reviewed and are negative.   Physical Exam Updated Vital Signs BP (!) 151/101 (BP Location: Left Arm)   Pulse 61   Temp 98 F (36.7 C) (Oral)  Resp 16   Ht 5\' 2"  (1.575 m)   Wt 59.9 kg   SpO2 100%   BMI 24.14 kg/m  Physical Exam Vitals and nursing note reviewed.  Constitutional:      Appearance: Normal appearance.  HENT:     Head: Normocephalic.     Comments: Abrasions to the tip of the nose and chin. Eyes:     General:        Right eye: No discharge.        Left eye: No discharge.     Conjunctiva/sclera: Conjunctivae normal.  Pulmonary:     Effort: Pulmonary effort is normal.  Musculoskeletal:     Comments: 2+ radial pulse felt in the bilateral wrists.  Right wrist is swollen and tender to palpation.  Restricted range of motion secondary to pain.  Skin:    General: Skin is warm and dry.     Findings: No rash.     Comments: Mild abrasions to the bilateral anterior  knees.  Neurological:     General: No focal deficit present.     Mental Status: She is alert.  Psychiatric:        Mood and Affect: Mood normal.        Behavior: Behavior normal.     ED Results / Procedures / Treatments   Labs (all labs ordered are listed, but only abnormal results are displayed) Labs Reviewed - No data to display  EKG None  Radiology CT Maxillofacial Wo Contrast Result Date: 07/24/2023 CLINICAL DATA:  Trauma.  Fall. EXAM: CT HEAD WITHOUT CONTRAST CT MAXILLOFACIAL WITHOUT CONTRAST CT CERVICAL SPINE WITHOUT CONTRAST TECHNIQUE: Multidetector CT imaging of the head, cervical spine, and maxillofacial structures were performed using the standard protocol without intravenous contrast. Multiplanar CT image reconstructions of the cervical spine and maxillofacial structures were also generated. RADIATION DOSE REDUCTION: This exam was performed according to the departmental dose-optimization program which includes automated exposure control, adjustment of the mA and/or kV according to patient size and/or use of iterative reconstruction technique. COMPARISON:  MRI brain 04/05/2015. head CT 01/29/2017. FINDINGS: CT HEAD FINDINGS Brain: No evidence of acute infarction, hemorrhage, hydrocephalus, extra-axial collection or mass lesion/mass effect. There is a small area of encephalomalacia in the right frontal lobe which is new from 2018. Vascular: No hyperdense vessel or unexpected calcification. Skull: Normal. Negative for fracture or focal lesion. Other: None. CT MAXILLOFACIAL FINDINGS Osseous: No fracture or mandibular dislocation. No destructive process. Orbits: Negative. No traumatic or inflammatory finding. Sinuses: There is mucosal thickening and air-fluid level in the right sphenoid sinus. Mastoid air cells are clear. Soft tissues: There is soft tissue edema overlying the anterior mid mandible no foreign body identified. No focal hematoma identified. CT CERVICAL SPINE FINDINGS  Alignment: Normal. Skull base and vertebrae: Anterior fusion plate is seen at C5-C6 without evidence for hardware complication. There is no acute fracture or focal osseous lesion. Soft tissues and spinal canal: No prevertebral soft tissue swelling. No visible central spinal canal hematoma. Left vertebral artery stent present. Disc levels: There is disc space narrowing and endplate osteophyte formation at C4-C5, C5-C6 and C6-C7. There is no severe central canal or neural foraminal stenosis at any level. There is mild bilateral neural foraminal stenosis at C4-C5 secondary to uncovertebral spurring. Upper chest: There is scarring in the lung apices. Other: None. IMPRESSION: 1. No acute intracranial process. 2. 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 3. No acute facial bone fracture. 4. Soft tissue  edema overlying the anterior mid mandible. 5. No acute fracture or traumatic subluxation of the cervical spine. 6. Anterior fusion plate at U9-W1 without evidence for hardware complication. 7. Degenerative changes of the cervical spine. Electronically Signed   By: Tyron Gallon M.D.   On: 07/24/2023 19:10   CT Head Wo Contrast Result Date: 07/24/2023 CLINICAL DATA:  Trauma.  Fall. EXAM: CT HEAD WITHOUT CONTRAST CT MAXILLOFACIAL WITHOUT CONTRAST CT CERVICAL SPINE WITHOUT CONTRAST TECHNIQUE: Multidetector CT imaging of the head, cervical spine, and maxillofacial structures were performed using the standard protocol without intravenous contrast. Multiplanar CT image reconstructions of the cervical spine and maxillofacial structures were also generated. RADIATION DOSE REDUCTION: This exam was performed according to the departmental dose-optimization program which includes automated exposure control, adjustment of the mA and/or kV according to patient size and/or use of iterative reconstruction technique. COMPARISON:  MRI brain 04/05/2015. head CT 01/29/2017. FINDINGS: CT HEAD FINDINGS Brain:  No evidence of acute infarction, hemorrhage, hydrocephalus, extra-axial collection or mass lesion/mass effect. There is a small area of encephalomalacia in the right frontal lobe which is new from 2018. Vascular: No hyperdense vessel or unexpected calcification. Skull: Normal. Negative for fracture or focal lesion. Other: None. CT MAXILLOFACIAL FINDINGS Osseous: No fracture or mandibular dislocation. No destructive process. Orbits: Negative. No traumatic or inflammatory finding. Sinuses: There is mucosal thickening and air-fluid level in the right sphenoid sinus. Mastoid air cells are clear. Soft tissues: There is soft tissue edema overlying the anterior mid mandible no foreign body identified. No focal hematoma identified. CT CERVICAL SPINE FINDINGS Alignment: Normal. Skull base and vertebrae: Anterior fusion plate is seen at C5-C6 without evidence for hardware complication. There is no acute fracture or focal osseous lesion. Soft tissues and spinal canal: No prevertebral soft tissue swelling. No visible central spinal canal hematoma. Left vertebral artery stent present. Disc levels: There is disc space narrowing and endplate osteophyte formation at C4-C5, C5-C6 and C6-C7. There is no severe central canal or neural foraminal stenosis at any level. There is mild bilateral neural foraminal stenosis at C4-C5 secondary to uncovertebral spurring. Upper chest: There is scarring in the lung apices. Other: None. IMPRESSION: 1. No acute intracranial process. 2. 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 3. No acute facial bone fracture. 4. Soft tissue edema overlying the anterior mid mandible. 5. No acute fracture or traumatic subluxation of the cervical spine. 6. Anterior fusion plate at X9-J4 without evidence for hardware complication. 7. Degenerative changes of the cervical spine. Electronically Signed   By: Tyron Gallon M.D.   On: 07/24/2023 19:10   CT Cervical Spine Wo  Contrast Result Date: 07/24/2023 CLINICAL DATA:  Trauma.  Fall. EXAM: CT HEAD WITHOUT CONTRAST CT MAXILLOFACIAL WITHOUT CONTRAST CT CERVICAL SPINE WITHOUT CONTRAST TECHNIQUE: Multidetector CT imaging of the head, cervical spine, and maxillofacial structures were performed using the standard protocol without intravenous contrast. Multiplanar CT image reconstructions of the cervical spine and maxillofacial structures were also generated. RADIATION DOSE REDUCTION: This exam was performed according to the departmental dose-optimization program which includes automated exposure control, adjustment of the mA and/or kV according to patient size and/or use of iterative reconstruction technique. COMPARISON:  MRI brain 04/05/2015. head CT 01/29/2017. FINDINGS: CT HEAD FINDINGS Brain: No evidence of acute infarction, hemorrhage, hydrocephalus, extra-axial collection or mass lesion/mass effect. There is a small area of encephalomalacia in the right frontal lobe which is new from 2018. Vascular: No hyperdense vessel or unexpected calcification. Skull: Normal.  Negative for fracture or focal lesion. Other: None. CT MAXILLOFACIAL FINDINGS Osseous: No fracture or mandibular dislocation. No destructive process. Orbits: Negative. No traumatic or inflammatory finding. Sinuses: There is mucosal thickening and air-fluid level in the right sphenoid sinus. Mastoid air cells are clear. Soft tissues: There is soft tissue edema overlying the anterior mid mandible no foreign body identified. No focal hematoma identified. CT CERVICAL SPINE FINDINGS Alignment: Normal. Skull base and vertebrae: Anterior fusion plate is seen at C5-C6 without evidence for hardware complication. There is no acute fracture or focal osseous lesion. Soft tissues and spinal canal: No prevertebral soft tissue swelling. No visible central spinal canal hematoma. Left vertebral artery stent present. Disc levels: There is disc space narrowing and endplate osteophyte  formation at C4-C5, C5-C6 and C6-C7. There is no severe central canal or neural foraminal stenosis at any level. There is mild bilateral neural foraminal stenosis at C4-C5 secondary to uncovertebral spurring. Upper chest: There is scarring in the lung apices. Other: None. IMPRESSION: 1. No acute intracranial process. 2. 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 3. No acute facial bone fracture. 4. Soft tissue edema overlying the anterior mid mandible. 5. No acute fracture or traumatic subluxation of the cervical spine. 6. Anterior fusion plate at G9-F6 without evidence for hardware complication. 7. Degenerative changes of the cervical spine. Electronically Signed   By: Tyron Gallon M.D.   On: 07/24/2023 19:10   DG Wrist Complete Right Result Date: 07/24/2023 CLINICAL DATA:  Left wrist pain after tripping injury. EXAM: RIGHT WRIST - COMPLETE 3+ VIEW COMPARISON:  None Available. FINDINGS: Mildly displaced fracture is seen involving the medial portion of the distal radius with intra-articular extension. The ulna is unremarkable. IMPRESSION: Mildly displaced distal right radial fracture is noted with intra-articular extension. Electronically Signed   By: Rosalene Colon M.D.   On: 07/24/2023 17:30   DG Wrist Complete Left Result Date: 07/24/2023 CLINICAL DATA:  Left wrist pain after tripping injury. EXAM: LEFT WRIST - COMPLETE 3+ VIEW COMPARISON:  None Available. FINDINGS: There is no evidence of fracture or dislocation. There is no evidence of arthropathy or other focal bone abnormality. Soft tissues are unremarkable. IMPRESSION: Negative. Electronically Signed   By: Rosalene Colon M.D.   On: 07/24/2023 17:27    Procedures .Splint Application  Date/Time: 07/24/2023 8:19 PM  Performed by: Darletta Ehrich, PA-C Authorized by: Darletta Ehrich, PA-C   Consent:    Consent obtained:  Verbal   Consent given by:  Patient   Risks, benefits, and alternatives were  discussed: yes     Alternatives discussed:  No treatment Universal protocol:    Procedure explained and questions answered to patient or proxy's satisfaction: yes     Relevant documents present and verified: yes     Test results available: yes     Imaging studies available: yes     Site/side marked: no     Immediately prior to procedure a time out was called: no     Patient identity confirmed:  Verbally with patient and arm band Pre-procedure details:    Distal neurologic exam:  Normal   Distal perfusion: distal pulses strong and brisk capillary refill   Procedure details:    Location:  Wrist   Wrist location:  R wrist   Cast type:  Short arm   Splint type:  Sugar tong   Supplies:  Prefabricated splint Post-procedure details:    Distal neurologic exam:  Normal   Distal perfusion: distal pulses strong and brisk capillary refill     Procedure completion:  Tolerated well, no immediate complications     Medications Ordered in ED Medications  HYDROcodone -acetaminophen  (NORCO/VICODIN) 5-325 MG per tablet 2 tablet (2 tablets Oral Given 07/24/23 1920)    ED Course/ Medical Decision Making/ A&P Clinical Course as of 07/24/23 2023  Thu Jul 24, 2023  1932 On repeat evaluation, patient is feeling better after some pain medication.  I went over all imaging results with her.  We replaced her in a sugar-tong splint and have her follow-up with orthopedics for a distal right radius fracture.  It is minimally displaced.  Patient does have a pain management doctor so I will not prescribe any narcotic pain medication.  Patient is agreeable with this. [CF]  1933 I personally ordered interpreted CT scans of the maxillofacial area, head, and cervical spine.  These were all normal.  I personally reviewed these as well.  I do agree with the radiologist interpretation. [CF]  1933 I also personally ordered and interpreted x-ray imaging over the right and left wrist.  The right wrist does appear to be  fractured and minimally displaced.  I do agree with the radiologist interpretation. [CF]    Clinical Course User Index [CF] Darletta Ehrich, PA-C   {   Click here for ABCD2, HEART and other calculators  Medical Decision Making BENTLI LLORENTE is a 63 y.o. female patient presents to the emergency department today for further evaluation of mechanical trip and fall.  Will likely get imaging of the head, face, neck, and bilateral wrists.  I am concerned for a possible wrist fracture.  Imaging did reveal that the patient did have a distal radius fracture.  It is minimally displaced.  Will place her in a sugar-tong splint which was placed by the Ortho tech and reviewed by myself.  She had good cap refill and good sensation pre and post splinting.  Will have her follow-up with orthopedics for further evaluation.  Strict return precautions were discussed.  Patient is safe for discharge at this time.   Amount and/or Complexity of Data Reviewed Radiology: ordered.  Risk Prescription drug management.    Final Clinical Impression(s) / ED Diagnoses Final diagnoses:  Fall, initial encounter  Other closed intra-articular fracture of distal end of right radius, initial encounter  Abrasion of face, initial encounter  Abrasion of right knee, initial encounter    Rx / DC Orders ED Discharge Orders     None         Olympia Bianchi 07/24/23 2024    Cheyenne Cotta, MD 07/25/23 1258

## 2023-07-24 NOTE — ED Triage Notes (Signed)
 Pt tripped and fall on pavement. Pt has scrapes to her nose, chin, knees, and pain to right wrist. Denies blood thinners.

## 2023-07-24 NOTE — ED Notes (Signed)
 Ortho tech called at [facility].

## 2023-07-24 NOTE — ED Notes (Signed)
 Patient transported to X-ray

## 2023-07-24 NOTE — Discharge Instructions (Addendum)
 Please follow-up with orthopedics.  I have given you a number to call and you can schedule an appointment.  You can return to the emergency department for any worsening symptoms.  Please take your chronic pain medication as prescribed by your pain management doctor.

## 2023-07-28 DIAGNOSIS — I639 Cerebral infarction, unspecified: Secondary | ICD-10-CM | POA: Insufficient documentation

## 2023-07-31 ENCOUNTER — Ambulatory Visit: Admitting: Orthopedic Surgery

## 2023-07-31 VITALS — BP 137/84 | HR 78 | Ht 62.0 in | Wt 132.0 lb

## 2023-07-31 DIAGNOSIS — S52571A Other intraarticular fracture of lower end of right radius, initial encounter for closed fracture: Secondary | ICD-10-CM | POA: Diagnosis not present

## 2023-07-31 MED ORDER — HYDROCODONE-ACETAMINOPHEN 10-325 MG PO TABS: 1.0000 | ORAL_TABLET | Freq: Four times a day (QID) | ORAL | 0 refills | Status: AC | PRN

## 2023-07-31 NOTE — Progress Notes (Signed)
  Intake history:  BP 137/84   Pulse 78   Ht 5\' 2"  (1.575 m)   Wt 132 lb (59.9 kg)   BMI 24.14 kg/m  Body mass index is 24.14 kg/m.    WHAT ARE WE SEEING YOU FOR TODAY?   right wrist(s)  How long has this bothered you? (DOI?DOS?WS?)  on 07/24/23  Anticoag.  No  Diabetes No  Heart disease Yes  Hypertension Yes  SMOKING HX No  Kidney disease No  Any ALLERGIES ______________________________________________   Treatment:  Have you taken:  Tylenol  No  Advil No  Had PT No  Had injection No  Other  ________________sugar tong splint and Hydrocodone  _________

## 2023-07-31 NOTE — Progress Notes (Signed)
 Patient ID: Alyssa Poole, female   DOB: 17-Dec-1960, 63 y.o.   MRN: [id number]  ASSESSMENT AND PLAN  We have a 63 year old female minimally displaced right distal radius fracture amenable to cast treatment  Past 4 weeks then cast off x-ray then brace 4 weeks then x-ray then release if no problems at that time  Chief Complaint  Patient presents with   Wrist Injury    July 24, 2023 pain right wrist secondary to fall     HPI Alyssa Poole is a 63 y.o. female.   I fell injured my right wrist  She fell on April 24 she went to the ER x-rays show an intra-articular fracture of the right distal radius she was screened with CT scan cervical spine head left wrist x-rays were also done  Just now complains of right wrist pain nonradiating located over the right wrist with no neurovascular compromise     Review of Systems Review of Systems  History of multiple joint aches  Pain response is somewhat altered   Past Medical History:  Diagnosis Date   Anxiety    Arthritis    Asthma    Atrial fibrillation (HCC)    Chronic headaches    Chronic neck pain    Depression 2002   Dysconjugate gaze    GERD (gastroesophageal reflux disease)    Hypertension    Neuromuscular disorder (HCC)    Osteoporosis    Panic attacks 2002   Stroke [facility])     Past Surgical History:  Procedure Laterality Date   BACK SURGERY     x4   CERVICAL SPINE SURGERY     plate in neck   CHOLECYSTECTOMY     KNEE ARTHROSCOPY WITH MEDIAL MENISECTOMY Left 03/09/2014   Procedure: LEFT KNEE ARTHROSCOPY WITH PARTIAL MEDIAL MENISECTOMY;  Surgeon: Darrin Emerald, MD;  Location: [facility];  Service: Orthopedics;  Laterality: Left;   SHOULDER ARTHROSCOPY Left 11/14/2016   Procedure: ARTHROSCOPY SHOULDER WITH LIMITED DEDBRIDEMENT AND ACROMIOPLASTY AND DISTAL CLAVICAL EXCISION;  Surgeon: Darrin Emerald, MD;  Location: [facility];  Service: Orthopedics;  Laterality: Left;   TOTAL KNEE ARTHROPLASTY Left  09/14/2014   Procedure: LEFT TOTAL KNEE ARTHROPLASTY;  Surgeon: Darrin Emerald, MD;  Location: [facility];  Service: Orthopedics;  Laterality: Left;      Physical Exam 1 BP 137/84   Pulse 78   Ht 5\' 2"  (1.575 m)   Wt 132 lb (59.9 kg)   BMI 24.14 kg/m   2 The patient is well developed well nourished and well groomed. 3 Orientation to person place and time is normal  4 Mood is pleasant.  5 Ambulatory status no assistive devices  6 Inspection of the left wrist reveals moderate tenderness   mild swelling no deformity 7 Range of motion assessment: The range of motion is diminished primarily secondary to pain 8 Stability tests are deferred because of pain but the x-ray shows no subluxation of the joint 9 Strength assessment muscle tone is normal resistance testing is deferred because of pain and swelling  10 Nerve function normal 11 Vascular function normal 12 Local lymphatic system normal  Opposite extremity  there is no alignment abnormality, no contracture, no subluxation, no atrophy and neurovascular exam is intact  MEDICAL DECISION MAKING  A.  Encounter Diagnosis  Name Primary?   Other closed intra-articular fracture of distal end of right radius, initial encounter Yes     I reviewed the outside images of the left wrist there  is no fracture dislocation or soft tissue swelling  I reviewed the x-rays of the right wrist there is intra-articular fracture of the right wrist nondisplaced no change in alignment is a vertical split through the scapholunate fossa and there is a transverse fracture component as well.  There may be an avulsion of the distal tip of the ulna  CT scan shows a mid cervical fusion with plate fixation anteriorly no acute fracture  Recommend cast  I will refill her medicine she does not tolerate pain well and my recollection  Cast off x-ray 4 weeks switch to brace then another 4 weeks of immobilization then x-ray at 8 weeks and release if everything is  okay  Meds ordered this encounter  Medications   HYDROcodone -acetaminophen  (NORCO) 10-325 MG tablet    Sig: Take 1 tablet by mouth every 6 (six) hours as needed.    Dispense:  30 tablet    Refill:  0

## 2023-08-05 ENCOUNTER — Inpatient Hospital Stay: Payer: Medicare HMO | Attending: Oncology

## 2023-08-05 DIAGNOSIS — E538 Deficiency of other specified B group vitamins: Secondary | ICD-10-CM | POA: Insufficient documentation

## 2023-08-05 DIAGNOSIS — R768 Other specified abnormal immunological findings in serum: Secondary | ICD-10-CM | POA: Diagnosis not present

## 2023-08-05 DIAGNOSIS — D649 Anemia, unspecified: Secondary | ICD-10-CM | POA: Diagnosis present

## 2023-08-05 LAB — COMPREHENSIVE METABOLIC PANEL WITH GFR
ALT: 20 U/L (ref 0–44)
AST: 28 U/L (ref 15–41)
Albumin: 3.9 g/dL (ref 3.5–5.0)
Alkaline Phosphatase: 59 U/L (ref 38–126)
Anion gap: 10 (ref 5–15)
BUN: 12 mg/dL (ref 8–23)
CO2: 23 mmol/L (ref 22–32)
Calcium: 9.3 mg/dL (ref 8.9–10.3)
Chloride: 104 mmol/L (ref 98–111)
Creatinine, Ser: 0.86 mg/dL (ref 0.44–1.00)
GFR, Estimated: >60 mL/min (ref >60)
Glucose, Bld: 113 mg/dL — ABNORMAL HIGH (ref 70–99)
Potassium: 3.6 mmol/L (ref 3.5–5.1)
Sodium: 137 mmol/L (ref 135–145)
Total Bilirubin: 0.4 mg/dL (ref 0.0–1.2)
Total Protein: 7.5 g/dL (ref 6.5–8.1)

## 2023-08-05 LAB — CBC WITH DIFFERENTIAL/PLATELET
Abs Immature Granulocytes: 0.03 10*3/uL (ref 0.00–0.07)
Basophils Absolute: 0.1 10*3/uL (ref 0.0–0.1)
Basophils Relative: 1 %
Eosinophils Absolute: 0.5 10*3/uL (ref 0.0–0.5)
Eosinophils Relative: 6 %
HCT: 31.6 % — ABNORMAL LOW (ref 36.0–46.0)
Hemoglobin: 10.4 g/dL — ABNORMAL LOW (ref 12.0–15.0)
Immature Granulocytes: 0 %
Lymphocytes Relative: 11 %
Lymphs Abs: 1 10*3/uL (ref 0.7–4.0)
MCH: 31.8 pg (ref 26.0–34.0)
MCHC: 32.9 g/dL (ref 30.0–36.0)
MCV: 96.6 fL (ref 80.0–100.0)
Monocytes Absolute: 0.8 10*3/uL (ref 0.1–1.0)
Monocytes Relative: 9 %
Neutro Abs: 6.2 10*3/uL (ref 1.7–7.7)
Neutrophils Relative %: 73 %
Platelets: 351 10*3/uL (ref 150–400)
RBC: 3.27 MIL/uL — ABNORMAL LOW (ref 3.87–5.11)
RDW: 12.6 % (ref 11.5–15.5)
WBC: 8.4 10*3/uL (ref 4.0–10.5)
nRBC: 0 % (ref 0.0–0.2)

## 2023-08-05 LAB — IRON AND TIBC
Iron: 35 ug/dL (ref 28–170)
Saturation Ratios: 12 % (ref 10.4–31.8)
TIBC: 300 ug/dL (ref 250–450)
UIBC: 265 ug/dL

## 2023-08-05 LAB — VITAMIN B12: Vitamin B-12: 2692 pg/mL — ABNORMAL HIGH (ref 180–914)

## 2023-08-05 LAB — LACTATE DEHYDROGENASE: LDH: 205 U/L — ABNORMAL HIGH (ref 98–192)

## 2023-08-05 LAB — FERRITIN: Ferritin: 108 ng/mL (ref 11–307)

## 2023-08-05 LAB — FOLATE: Folate: >40 ng/mL (ref >5.9)

## 2023-08-06 LAB — KAPPA/LAMBDA LIGHT CHAINS
Kappa free light chain: 38.8 mg/L — ABNORMAL HIGH (ref 3.3–19.4)
Kappa, lambda light chain ratio: 1.42 (ref 0.26–1.65)
Lambda free light chains: 27.4 mg/L — ABNORMAL HIGH (ref 5.7–26.3)

## 2023-08-07 LAB — MULTIPLE MYELOMA PANEL, SERUM
Albumin SerPl Elph-Mcnc: 3.7 g/dL (ref 2.9–4.4)
Albumin/Glob SerPl: 1.1 (ref 0.7–1.7)
Alpha 1: 0.3 g/dL (ref 0.0–0.4)
Alpha2 Glob SerPl Elph-Mcnc: 0.8 g/dL (ref 0.4–1.0)
B-Globulin SerPl Elph-Mcnc: 1 g/dL (ref 0.7–1.3)
Gamma Glob SerPl Elph-Mcnc: 1.3 g/dL (ref 0.4–1.8)
Globulin, Total: 3.4 g/dL (ref 2.2–3.9)
IgA: 300 mg/dL (ref 87–352)
IgG (Immunoglobin G), Serum: 1389 mg/dL (ref 586–1602)
IgM (Immunoglobulin M), Srm: 40 mg/dL (ref 26–217)
Total Protein ELP: 7.1 g/dL (ref 6.0–8.5)

## 2023-08-11 ENCOUNTER — Ambulatory Visit: Payer: Medicare HMO | Admitting: Obstetrics and Gynecology

## 2023-08-12 ENCOUNTER — Inpatient Hospital Stay: Payer: Medicare HMO | Admitting: Oncology

## 2023-08-19 ENCOUNTER — Inpatient Hospital Stay (HOSPITAL_BASED_OUTPATIENT_CLINIC_OR_DEPARTMENT_OTHER): Admitting: Oncology

## 2023-08-19 VITALS — BP 121/74 | HR 78 | Temp 98.1°F | Resp 20 | Wt 139.2 lb

## 2023-08-19 DIAGNOSIS — D509 Iron deficiency anemia, unspecified: Secondary | ICD-10-CM | POA: Diagnosis not present

## 2023-08-19 DIAGNOSIS — D649 Anemia, unspecified: Secondary | ICD-10-CM | POA: Diagnosis not present

## 2023-08-19 DIAGNOSIS — R768 Other specified abnormal immunological findings in serum: Secondary | ICD-10-CM

## 2023-08-19 MED ORDER — FERROUS SULFATE 325 (65 FE) MG PO TBEC
325.0000 mg | DELAYED_RELEASE_TABLET | ORAL | 3 refills | Status: AC
Start: 2023-08-19 — End: ?

## 2023-08-19 NOTE — Progress Notes (Signed)
 Alyssa Poole  HEMATOLOGY FOLLOW-UP VISIT  Poole, Alyssa Poole  REASON FOR FOLLOW-UP: Anemia  ASSESSMENT & PLAN:  Patient is a 63 y.o.  female with hemorrhagic stroke, hypertension, A-fib not on anticoagulation came for follow-up for her anemia .  Anemia Anemia likely multifactorial at this time.   Iron and B12 deficiency previously noted.  Started on oral supplementation SPEP showed no M spike.   Slight elevation of kappa free light chains. Labs today consistent with iron deficiency  - Will schedule for IV iron infusion.  Discussed most common adverse effects including allergic reactions, nausea and headaches.  Patient is in agreement. - Continue oral iron supplementation every other day - Continue vitamin B12 supplementation every day - Will refer to GI for colonoscopy and endoscopy  Return to clinic in 3 months with labs.  Elevated serum immunoglobulin free light chains Patient has slightly elevated kappa free light chains with normal FLC ratio.  SPEP: No M spike Normal kidney function.   Autoimmune workup: Negative  - Will monitor for now, will consider bone marrow biopsy if anemia is unresolved and free light chains continues to be elevated    Orders Placed This Encounter  Procedures   Ferritin    Standing Status:   Future    Expected Date:   11/17/2023    Expiration Date:   08/18/2024   Folate    Standing Status:   Future    Expected Date:   11/17/2023    Expiration Date:   08/18/2024   Vitamin B12    Standing Status:   Future    Expected Date:   11/17/2023    Expiration Date:   08/18/2024   CBC with Differential/Platelet    Standing Status:   Future    Expected Date:   11/17/2023    Expiration Date:   08/18/2024   Comprehensive metabolic panel with GFR    Standing Status:   Future    Expected Date:   11/17/2023    Expiration Date:   08/18/2024   Kappa/lambda light chains    Standing Status:   Future    Expected Date:    11/17/2023    Expiration Date:   08/18/2024    The total time spent in the appointment was 20 minutes encounter with patients including review of chart and various tests results, discussions about plan of care and coordination of care plan   All questions were answered. The patient knows to call the clinic with any problems, questions or concerns. No barriers to learning was detected.  Alyssa Grade, MD 5/23/20253:36 PM   INTERVAL HISTORY: Alyssa Poole 63 y.o. female is here for follow-up for anemia.  She is accompanied with her husband today.  Patient since I last saw her in clinic, had a fall resulting in a fracture of her right arm.  She is currently recovering and has no pain. She has a history of osteoporosis and is taking calcium  and vitamin D  supplements.  She does experience persistent tiredness and has no other complaints today.  I have reviewed the past Poole history, past surgical history, social history and family history with the patient   ALLERGIES:  is allergic to bee venom.  MEDICATIONS:  Current Outpatient Medications  Medication Sig Dispense Refill   alendronate (FOSAMAX) 70 MG tablet Take 70 mg by mouth every 7 (seven) days. Takes on Sunday. Take with a full glass of water on an empty stomach.  aspirin  EC 81 MG tablet Take 81 mg by mouth daily. Swallow whole.     atorvastatin  (LIPITOR) 10 MG tablet Take 10 mg by mouth at bedtime.     baclofen  (LIORESAL ) 10 MG tablet Take 10 mg by mouth 3 (three) times daily. Takes as needed     calcium  carbonate (OSCAL) 1500 (600 Ca) MG TABS tablet Take 1 tablet by mouth daily.     [facility] CALCIUM  600+D 600-800 MG-UNIT TABS Take 1 tablet by mouth daily.  11   cyanocobalamin  (VITAMIN B12) 1000 MCG tablet TAKE 1 TABLET EVERY DAY 90 tablet 3   escitalopram  (LEXAPRO ) 20 MG tablet Take 1 tablet by mouth daily.     Ferrous Sulfate  (IRON PO) Take 1 tablet by mouth daily.     HYDROcodone -acetaminophen  (NORCO) 10-325 MG tablet Take 1  tablet by mouth every 6 (six) hours as needed. 30 tablet 0   magnesium  oxide (MAGOX 400) 400 (241.3 Mg) MG tablet Take 1 tablet (400 mg total) by mouth daily. 30 tablet 1   naproxen (NAPROSYN) 500 MG tablet Take 500 mg by mouth daily.     ondansetron  (ZOFRAN ) 4 MG tablet Take 1 tablet (4 mg total) by mouth every 6 (six) hours. 12 tablet 0   pantoprazole  (PROTONIX ) 40 MG tablet Take 1 tablet (40 mg total) by mouth 2 (two) times daily. 60 tablet 2   potassium chloride  SA (KLOR-CON ) 20 MEQ tablet Take 1 tablet (20 mEq total) by mouth daily. 30 tablet 1   pregabalin  (LYRICA ) 50 MG capsule Take 50 mg by mouth 3 (three) times daily.     sotalol  (BETAPACE ) 120 MG tablet Take 0.5 tablets (60 mg total) by mouth daily.     topiramate  (TOPAMAX ) 50 MG tablet Take 50 mg by mouth daily.     Black Cohosh  540 MG CAPS Take 1 capsule by mouth daily. (Patient not taking: Reported on 08/19/2023)     ferrous sulfate  325 (65 FE) MG EC tablet Take 1 tablet (325 mg total) by mouth every other day. 45 tablet 3   No current facility-administered medications for this visit.     REVIEW OF SYSTEMS:   Constitutional: Denies fevers, chills or night sweats Eyes: Denies blurriness of vision Ears, nose, mouth, throat, and face: Denies mucositis or sore throat Respiratory: Denies cough, dyspnea or wheezes Cardiovascular: Denies palpitation, chest discomfort or lower extremity swelling Gastrointestinal:  Denies nausea, heartburn or change in bowel habits Skin: Denies abnormal skin rashes Lymphatics: Denies new lymphadenopathy or easy bruising Neurological:Denies numbness, tingling or new weaknesses Behavioral/Psych: Mood is stable, no new changes  All other systems were reviewed with the patient and are negative.  PHYSICAL EXAMINATION:   Vitals:   08/19/23 0829  BP: 121/74  Pulse: 78  Resp: 20  Temp: 98.1 F (36.7 C)  SpO2: 96%    GENERAL:alert, no distress and comfortable LUNGS: clear to auscultation and  percussion with normal breathing effort HEART: regular rate & rhythm and no murmurs and no lower extremity edema ABDOMEN:abdomen soft, non-tender and normal bowel sounds Musculoskeletal:no cyanosis of digits and no clubbing  NEURO: alert & oriented x 3 with fluent speech  LABORATORY DATA:  I have reviewed the data as listed  Lab Results  Component Value Date   WBC 8.4 08/05/2023   NEUTROABS 6.2 08/05/2023   HGB 10.4 (L) 08/05/2023   HCT 31.6 (L) 08/05/2023   MCV 96.6 08/05/2023   PLT 351 08/05/2023      Chemistry  Component Value Date/Time   NA 137 08/05/2023 1059   K 3.6 08/05/2023 1059   CL 104 08/05/2023 1059   CO2 23 08/05/2023 1059   BUN 12 08/05/2023 1059   CREATININE 0.86 08/05/2023 1059      Component Value Date/Time   CALCIUM  9.3 08/05/2023 1059   ALKPHOS 59 08/05/2023 1059   AST 28 08/05/2023 1059   ALT 20 08/05/2023 1059   BILITOT 0.4 08/05/2023 1059      Latest Reference Range & Units 08/05/23 10:59  LDH 98 - 192 U/L 205 (H)  Iron 28 - 170 ug/dL 35  UIBC ug/dL 161  TIBC 096 - 045 ug/dL 409  Saturation Ratios 10.4 - 31.8 % 12  Ferritin 11 - 307 ng/mL 108  Folate >5.9 ng/mL >40.0  Vitamin B12 180 - 914 pg/mL 2,692 (H)  Total Protein ELP 6.0 - 8.5 g/dL 7.1 (C)  Albumin SerPl Elph-Mcnc 2.9 - 4.4 g/dL 3.7 (C)  Albumin/Glob SerPl 0.7 - 1.7  1.1 (C)  Alpha2 Glob SerPl Elph-Mcnc 0.4 - 1.0 g/dL 0.8 (C)  Alpha 1 0.0 - 0.4 g/dL 0.3 (C)  Gamma Glob SerPl Elph-Mcnc 0.4 - 1.8 g/dL 1.3 (C)  M Protein SerPl Elph-Mcnc Not Observed g/dL Not Observed (C)  IFE 1  Comment (C)  Globulin, Total 2.2 - 3.9 g/dL 3.4 (C)  B-Globulin SerPl Elph-Mcnc 0.7 - 1.3 g/dL 1.0 (C)  IgG (Immunoglobin G), Serum 586 - 1,602 mg/dL 8,119  IgM (Immunoglobulin M), Srm 26 - 217 mg/dL 40  IgA 87 - 147 mg/dL 829  (H): Data is abnormally high (C): Corrected   Latest Reference Range & Units 08/05/23 10:59  Kappa free light chain 3.3 - 19.4 mg/L 38.8 (H)  Lambda free light chains 5.7 -  26.3 mg/L 27.4 (H)  Kappa, lambda light chain ratio 0.26 - 1.65  1.42  (H): Data is abnormally high

## 2023-08-19 NOTE — Patient Instructions (Signed)
 VISIT SUMMARY:  Today, we discussed your recent fall and fracture, your ongoing management of osteoporosis, iron deficiency anemia, fatigue, and uterine prolapse. We reviewed your current medications and supplements, and made some adjustments to your treatment plan to help improve your overall health and well-being.  YOUR PLAN:  -IRON DEFICIENCY ANEMIA: Iron deficiency anemia is a condition where your body lacks enough iron to produce healthy red blood cells, leading to fatigue and weakness. We discussed starting IV iron supplementation next month to improve your iron levels and alleviate fatigue. You should continue taking your oral iron supplements every other day and look for cheaper over-the-counter options or mail service options for your iron supplements.  -FATIGUE: Your chronic fatigue is likely related to your iron deficiency anemia. You should continue taking your vitamin B12 supplements to help manage this symptom.  -OSTEOPOROSIS: Osteoporosis is a condition that weakens bones, making them more prone to fractures. You should continue taking your calcium  and vitamin D  supplements to help strengthen your bones.  -UTERINE PROLAPSE WITH PESSARY MANAGEMENT: Uterine prolapse occurs when the uterus slips down into or protrudes out of the vagina. Your condition is being managed with a pessary, which is a device inserted into the vagina to support the uterus. You should continue using the pessary as it is helping to improve your condition.  INSTRUCTIONS:  Schedule an IV iron infusion for next month. Continue taking your oral iron supplements every other day and look for cheaper over-the-counter options or mail service options. Continue taking your vitamin B12 supplements. Continue taking your calcium  and vitamin D  supplements. Continue using your pessary for uterine prolapse management.

## 2023-08-19 NOTE — Assessment & Plan Note (Addendum)
 Anemia likely multifactorial at this time.   Iron and B12 deficiency previously noted.  Started on oral supplementation SPEP showed no M spike.   Slight elevation of kappa free light chains. Labs today consistent with iron deficiency  - Will schedule for IV iron infusion.  Discussed most common adverse effects including allergic reactions, nausea and headaches.  Patient is in agreement. - Continue oral iron supplementation every other day - Continue vitamin B12 supplementation every day - Will refer to GI for colonoscopy and endoscopy  Return to clinic in 3 months with labs.

## 2023-08-22 ENCOUNTER — Encounter: Payer: Self-pay | Admitting: Oncology

## 2023-08-22 DIAGNOSIS — D509 Iron deficiency anemia, unspecified: Secondary | ICD-10-CM | POA: Insufficient documentation

## 2023-08-22 NOTE — Assessment & Plan Note (Signed)
 Patient has slightly elevated kappa free light chains with normal FLC ratio.  SPEP: No M spike Normal kidney function.   Autoimmune workup: Negative  - Will monitor for now, will consider bone marrow biopsy if anemia is unresolved and free light chains continues to be elevated

## 2023-08-27 DIAGNOSIS — S52501D Unspecified fracture of the lower end of right radius, subsequent encounter for closed fracture with routine healing: Secondary | ICD-10-CM | POA: Insufficient documentation

## 2023-08-28 ENCOUNTER — Encounter: Payer: Self-pay | Admitting: Orthopedic Surgery

## 2023-08-28 ENCOUNTER — Other Ambulatory Visit (INDEPENDENT_AMBULATORY_CARE_PROVIDER_SITE_OTHER): Payer: Self-pay

## 2023-08-28 ENCOUNTER — Ambulatory Visit (INDEPENDENT_AMBULATORY_CARE_PROVIDER_SITE_OTHER): Admitting: Orthopedic Surgery

## 2023-08-28 DIAGNOSIS — S52571D Other intraarticular fracture of lower end of right radius, subsequent encounter for closed fracture with routine healing: Secondary | ICD-10-CM

## 2023-08-28 NOTE — Progress Notes (Signed)
   There were no vitals taken for this visit.  There is no height or weight on file to calculate BMI.  No chief complaint on file.   Encounter Diagnosis  Name Primary?   Other closed intra-articular fracture of distal end of right radius with routine healing, subsequent encounter4/24/25 Yes    DOI/DOS/ Date: 07/24/23  Improved

## 2023-08-28 NOTE — Progress Notes (Signed)
   Chief Complaint  Patient presents with   Wrist Injury    Right     Encounter Diagnosis  Name Primary?   Other closed intra-articular fracture of distal end of right radius with routine healing, subsequent encounter4/24/25 Yes    DOI/DOS/ Date: 07/24/23  Improved  Fracture care follow-up distal radius fracture right wrist  DG Wrist Complete Right Result Date: 08/28/2023 Images right wrist follow-up on right distal radius fracture with a date of injury of July 24, 2023 X-rays show slight dorsal tilt slight shortening transverse fracture distal radius classic Colles' fracture Overall alignment maintained with minimal angulation Minimal shortening Impression healing fracture right distal radius position acceptable for nonoperative care     Alyssa Poole still complains of some pain in the right wrist  I gave her exercises to do using the sixpack series and put her in a removable brace.  She is to exercise the wrist twice a day  Return in 4 weeks for x-ray

## 2023-09-02 ENCOUNTER — Inpatient Hospital Stay: Attending: Oncology

## 2023-09-02 VITALS — BP 134/72 | HR 61 | Temp 97.9°F | Resp 18

## 2023-09-02 DIAGNOSIS — D509 Iron deficiency anemia, unspecified: Secondary | ICD-10-CM | POA: Diagnosis present

## 2023-09-02 DIAGNOSIS — R768 Other specified abnormal immunological findings in serum: Secondary | ICD-10-CM | POA: Diagnosis not present

## 2023-09-02 MED ORDER — SODIUM CHLORIDE 0.9 % IV SOLN: INTRAVENOUS | Status: DC

## 2023-09-02 MED ORDER — SODIUM CHLORIDE 0.9 % IV SOLN
400.0000 mg | Freq: Once | INTRAVENOUS | Status: AC
  Administered 2023-09-02: 400 mg via INTRAVENOUS
  Filled 2023-09-02: qty 400

## 2023-09-02 MED ORDER — CETIRIZINE HCL 10 MG PO TABS
10.0000 mg | ORAL_TABLET | Freq: Once | ORAL | Status: AC
  Administered 2023-09-02: 10 mg via ORAL
  Filled 2023-09-02: qty 1

## 2023-09-02 MED ORDER — ACETAMINOPHEN 325 MG PO TABS
650.0000 mg | ORAL_TABLET | Freq: Once | ORAL | Status: AC
  Administered 2023-09-02: 650 mg via ORAL
  Filled 2023-09-02: qty 2

## 2023-09-02 NOTE — Patient Instructions (Signed)

## 2023-09-02 NOTE — Progress Notes (Signed)
 Patient presents today for Venofer infusion per providers order.  Vital signs WNL.  Patient has no new complaints at this time.  Peripheral IV started and blood return noted pre and post infusion.  Stable during infusion without adverse affects.  Vital signs stable.  No complaints at this time.  Discharge from clinic ambulatory in stable condition.  Alert and oriented X 3.  Follow up with [facility] as scheduled.

## 2023-09-09 ENCOUNTER — Inpatient Hospital Stay

## 2023-09-09 VITALS — BP 154/78 | HR 60 | Temp 97.4°F | Resp 18

## 2023-09-09 DIAGNOSIS — D509 Iron deficiency anemia, unspecified: Secondary | ICD-10-CM

## 2023-09-09 MED ORDER — ACETAMINOPHEN 325 MG PO TABS
650.0000 mg | ORAL_TABLET | Freq: Once | ORAL | Status: AC
  Administered 2023-09-09: 650 mg via ORAL
  Filled 2023-09-09: qty 2

## 2023-09-09 MED ORDER — CETIRIZINE HCL 10 MG PO TABS
10.0000 mg | ORAL_TABLET | Freq: Once | ORAL | Status: AC
  Administered 2023-09-09: 10 mg via ORAL
  Filled 2023-09-09: qty 1

## 2023-09-09 MED ORDER — SODIUM CHLORIDE 0.9 % IV SOLN
400.0000 mg | Freq: Once | INTRAVENOUS | Status: AC
  Administered 2023-09-09: 400 mg via INTRAVENOUS
  Filled 2023-09-09: qty 400

## 2023-09-09 MED ORDER — SODIUM CHLORIDE 0.9 % IV SOLN: INTRAVENOUS | Status: DC

## 2023-09-09 NOTE — Patient Instructions (Signed)

## 2023-09-09 NOTE — Progress Notes (Signed)
 Patient presents today for Venofer infusion per providers order.  Vital signs WNL.  Patient has no new complaints at this time.  Peripheral IV started and blood return noted pre and post infusion.  Stable during infusion without adverse affects.  Vital signs stable.  No complaints at this time.  Discharge from clinic ambulatory in stable condition.  Alert and oriented X 3.  Follow up with [facility] as scheduled.

## 2023-09-11 ENCOUNTER — Ambulatory Visit: Admitting: Obstetrics and Gynecology

## 2023-09-11 ENCOUNTER — Encounter: Payer: Self-pay | Admitting: Obstetrics and Gynecology

## 2023-09-11 ENCOUNTER — Ambulatory Visit: Payer: Self-pay | Admitting: Obstetrics and Gynecology

## 2023-09-11 VITALS — BP 110/64 | HR 60 | Temp 98.0°F | Wt 137.0 lb

## 2023-09-11 DIAGNOSIS — K5900 Constipation, unspecified: Secondary | ICD-10-CM | POA: Diagnosis not present

## 2023-09-11 DIAGNOSIS — R350 Frequency of micturition: Secondary | ICD-10-CM | POA: Diagnosis not present

## 2023-09-11 DIAGNOSIS — N760 Acute vaginitis: Secondary | ICD-10-CM

## 2023-09-11 DIAGNOSIS — N898 Other specified noninflammatory disorders of vagina: Secondary | ICD-10-CM

## 2023-09-11 DIAGNOSIS — N813 Complete uterovaginal prolapse: Secondary | ICD-10-CM | POA: Diagnosis not present

## 2023-09-11 DIAGNOSIS — M6289 Other specified disorders of muscle: Secondary | ICD-10-CM | POA: Diagnosis not present

## 2023-09-11 DIAGNOSIS — R32 Unspecified urinary incontinence: Secondary | ICD-10-CM

## 2023-09-11 LAB — WET PREP FOR TRICH, YEAST, CLUE

## 2023-09-11 MED ORDER — METRONIDAZOLE 500 MG PO TABS: 500.0000 mg | ORAL_TABLET | Freq: Two times a day (BID) | ORAL | 0 refills | Status: AC

## 2023-09-11 NOTE — Progress Notes (Signed)
 63 y.o. G62P1001 female with stage IV pelvic organ prolapse, pelvic floor dysfunction, constipation, A-fib, prior subarachnoid hemorrhage (2018), osteopenia (on fosamax) here for pessary cleaning. Married. Presents with husband. Live in [place], [place].  No LMP recorded. Patient is postmenopausal.  At 04/09/23 appt, she reported: She reports pelvic bulge since summer 2024.  It is bothersome at times but it is not painful.  She has started having less intercourse due to the prolapse.  She reports difficulty emptying her bladder sometimes.  She denies urinary leakage, constipation or diarrhea.  No vaginal bleeding. Sexually active: occasionally  At 05/16/23 appt, she reported: Pelvic pain, prolapse is worse. Pt will have pacemaker placed next month for Afib. Still having a difficulty with complete emptying.  Ring pessary #2 has come out during a bowel movement. Pt shown how to remove and insert on her own. She was referred to PFPT for weak Kegel 04/09/2023 however she has not been. 04/09/2023 PVR normal.  Today, she reports frequent urination and vaginal discharge, wears panty liner. Pessary has remained in place for the past 4 months. +chronic constipation. She missed her appointment last month due to right arm fracture.  GYN HISTORY: No significant history   OB History  Gravida Para Term Preterm AB Living  1 1 1   1   SAB IAB Ectopic Multiple Live Births          # Outcome Date GA Lbr Len/2nd Weight Sex Type Anes PTL Lv  1 Term             Past Medical History:  Diagnosis Date   Anxiety    Arthritis    Asthma    Atrial fibrillation (HCC)    Chronic headaches    Chronic neck pain    Depression 2002   Dysconjugate gaze    GERD (gastroesophageal reflux disease)    Hypertension    Neuromuscular disorder (HCC)    Osteoporosis    Panic attacks 2002   Stroke [facility])     Past Surgical History:  Procedure Laterality Date   BACK SURGERY     x4   CERVICAL SPINE SURGERY      plate in neck   CHOLECYSTECTOMY     KNEE ARTHROSCOPY WITH MEDIAL MENISECTOMY Left 03/09/2014   Procedure: LEFT KNEE ARTHROSCOPY WITH PARTIAL MEDIAL MENISECTOMY;  Surgeon: Darrin Emerald, MD;  Location: [facility];  Service: Orthopedics;  Laterality: Left;   SHOULDER ARTHROSCOPY Left 11/14/2016   Procedure: ARTHROSCOPY SHOULDER WITH LIMITED DEDBRIDEMENT AND ACROMIOPLASTY AND DISTAL CLAVICAL EXCISION;  Surgeon: Darrin Emerald, MD;  Location: [facility];  Service: Orthopedics;  Laterality: Left;   TOTAL KNEE ARTHROPLASTY Left 09/14/2014   Procedure: LEFT TOTAL KNEE ARTHROPLASTY;  Surgeon: Darrin Emerald, MD;  Location: [facility];  Service: Orthopedics;  Laterality: Left;    Current Outpatient Medications on File Prior to Visit  Medication Sig Dispense Refill   alendronate (FOSAMAX) 70 MG tablet Take 70 mg by mouth every 7 (seven) days. Takes on Sunday. Take with a full glass of water on an empty stomach.     aspirin  EC 81 MG tablet Take 81 mg by mouth daily. Swallow whole.     atorvastatin  (LIPITOR) 10 MG tablet Take 10 mg by mouth at bedtime.     baclofen  (LIORESAL ) 10 MG tablet Take 10 mg by mouth 3 (three) times daily. Takes as needed     calcium  carbonate (OSCAL) 1500 (600 Ca) MG TABS tablet Take 1 tablet by mouth daily.  [employer] CALCIUM  600+D 600-800 MG-UNIT TABS Take 1 tablet by mouth daily.  11   cyanocobalamin  (VITAMIN B12) 1000 MCG tablet TAKE 1 TABLET EVERY DAY 90 tablet 3   escitalopram  (LEXAPRO ) 20 MG tablet Take 1 tablet by mouth daily.     Ferrous Sulfate  (IRON  PO) Take 1 tablet by mouth daily.     ferrous sulfate  325 (65 FE) MG EC tablet Take 1 tablet (325 mg total) by mouth every other day. 45 tablet 3   HYDROcodone -acetaminophen  (NORCO) 10-325 MG tablet Take 1 tablet by mouth every 6 (six) hours as needed. 30 tablet 0   magnesium  oxide (MAGOX 400) 400 (241.3 Mg) MG tablet Take 1 tablet (400 mg total) by mouth daily. 30 tablet 1   naproxen (NAPROSYN) 500 MG tablet Take 500 mg by  mouth daily.     pantoprazole  (PROTONIX ) 40 MG tablet Take 1 tablet (40 mg total) by mouth 2 (two) times daily. 60 tablet 2   potassium chloride  SA (KLOR-CON ) 20 MEQ tablet Take 1 tablet (20 mEq total) by mouth daily. 30 tablet 1   pregabalin  (LYRICA ) 75 MG capsule Take 75 mg by mouth 3 (three) times daily.     sotalol  (BETAPACE ) 120 MG tablet Take 0.5 tablets (60 mg total) by mouth daily.     topiramate  (TOPAMAX ) 50 MG tablet Take 50 mg by mouth daily.     ondansetron  (ZOFRAN ) 4 MG tablet Take 1 tablet (4 mg total) by mouth every 6 (six) hours. (Patient not taking: Reported on 09/11/2023) 12 tablet 0   No current facility-administered medications on file prior to visit.    Allergies  Allergen Reactions   Bee Venom Swelling    Causes bad swelling. No epipen   needed      PE Today's Vitals   09/11/23 1018  BP: 110/64  Pulse: 60  Temp: 98 F (36.7 C)  TempSrc: Oral  SpO2: 99%  Weight: 137 lb (62.1 kg)   Body mass index is 25.06 kg/m.  Physical Exam Vitals reviewed. Exam conducted with a chaperone present.  Constitutional:      General: She is not in acute distress.    Appearance: Normal appearance.  HENT:     Head: Normocephalic and atraumatic.     Nose: Nose normal.   Eyes:     Extraocular Movements: Extraocular movements intact.     Conjunctiva/sclera: Conjunctivae normal.   Pulmonary:     Effort: Pulmonary effort is normal.  Genitourinary:    General: Normal vulva.     Exam position: Lithotomy position.     Vagina: Vaginal discharge present.     Cervix: Normal. No cervical motion tenderness, discharge or lesion.     Uterus: Normal. Not enlarged and not tender.      Adnexa: Right adnexa normal and left adnexa normal.     Comments: 0/5 kegel Inroitus has narrowed  Musculoskeletal:        General: Normal range of motion.     Cervical back: Normal range of motion.   Neurological:     General: No focal deficit present.     Mental Status: She is alert.    Psychiatric:        Mood and Affect: Mood normal.        Behavior: Behavior normal.     Pessary Cleaning: Pessary was easily removed. Pelvic exam revealed no discharge, no blood, or ulcerations. Vaginal mucosa intact.  Pessary cleaned with antimicrobial soap and rinsed.  Pessary inserted in the vagina easily.  Assessment and Plan:        Complete uterine prolapse Pelvic floor dysfunction in female Urinary incontinence, unspecified type Uncomplicated pessary cleaning Continue to recommend PFPT for pelvic floor weakness, complete uterine prolapse, urinary incontinence Consider adding PV estradiol. -     Ambulatory referral to Physical Therapy  Frequency of urination -     Urinalysis,Complete w/RFL Culture  Constipation, unspecified constipation type Recommend increasing hydration, fiber intake, and use of stool softeners.  Vaginal discharge -     WET PREP FOR TRICH, YEAST, CLUE  BV (bacterial vaginosis) -     metroNIDAZOLE ; Take 1 tablet (500 mg total) by mouth 2 (two) times daily for 7 days.  Dispense: 14 tablet; Refill: 0  Other orders -     Urine Culture -     REFLEXIVE URINE CULTURE  Return to office for annual exam in 3 months. Romaine Closs, MD

## 2023-09-12 LAB — URINALYSIS, COMPLETE W/RFL CULTURE
Bilirubin Urine: NEGATIVE
Glucose, UA: NEGATIVE
Hyaline Cast: NONE SEEN /LPF
Ketones, ur: NEGATIVE
Nitrites, Initial: NEGATIVE
Protein, ur: NEGATIVE
RBC / HPF: NONE SEEN /HPF (ref 0–2)
Specific Gravity, Urine: 1.015 (ref 1.001–1.035)
pH: 6 (ref 5.0–8.0)

## 2023-09-12 LAB — CULTURE INDICATED

## 2023-09-12 LAB — URINE CULTURE
MICRO NUMBER:: 16572581
SPECIMEN QUALITY:: ADEQUATE

## 2023-09-25 ENCOUNTER — Ambulatory Visit (INDEPENDENT_AMBULATORY_CARE_PROVIDER_SITE_OTHER): Admitting: Orthopedic Surgery

## 2023-09-25 ENCOUNTER — Other Ambulatory Visit (INDEPENDENT_AMBULATORY_CARE_PROVIDER_SITE_OTHER): Payer: Self-pay

## 2023-09-25 DIAGNOSIS — S52571D Other intraarticular fracture of lower end of right radius, subsequent encounter for closed fracture with routine healing: Secondary | ICD-10-CM

## 2023-09-25 NOTE — Progress Notes (Signed)
 Fracture care follow-up  Chief Complaint  Patient presents with   Wrist Injury    Encounter Diagnosis  Name Primary?   Other closed intra-articular fracture of distal end of right radius with routine healing, subsequent encounter4/24/25 Yes    DOI/DOS/ Date: 07/24/23  Right distal radius fracture complains of stiffness and volar wrist pain  X-rays show the fracture is healing appropriately see report for details  DG Wrist Complete Right Result Date: 09/25/2023 Right distal radius fracture Date of injury July 24, 2023 X-ray shows comminuted fracture distal radius vertical line through the joint Looks like a neutral to slightly dorsal distal tilt.  Sclerotic bone is seen at the fracture site Fracture incompletely healed on x-ray clinical correlation can determine further treatment course    Recommend wrist exercises  Patient to be seen

## 2023-09-25 NOTE — Progress Notes (Signed)
   There were no vitals taken for this visit.  There is no height or weight on file to calculate BMI.  Chief Complaint  Patient presents with   Wrist Injury    Encounter Diagnosis  Name Primary?   Other closed intra-articular fracture of distal end of right radius with routine healing, subsequent encounter4/24/25 Yes    DOI/DOS/ Date: 07/24/23  Improved

## 2023-09-29 ENCOUNTER — Ambulatory Visit: Admitting: Orthopedic Surgery

## 2023-09-29 ENCOUNTER — Other Ambulatory Visit (INDEPENDENT_AMBULATORY_CARE_PROVIDER_SITE_OTHER)

## 2023-09-29 ENCOUNTER — Encounter: Payer: Self-pay | Admitting: Orthopedic Surgery

## 2023-09-29 VITALS — BP 136/81 | HR 73 | Ht 62.0 in | Wt 137.0 lb

## 2023-09-29 DIAGNOSIS — G8929 Other chronic pain: Secondary | ICD-10-CM

## 2023-09-29 DIAGNOSIS — M25511 Pain in right shoulder: Secondary | ICD-10-CM | POA: Diagnosis not present

## 2023-09-29 DIAGNOSIS — M75101 Unspecified rotator cuff tear or rupture of right shoulder, not specified as traumatic: Secondary | ICD-10-CM

## 2023-09-29 MED ORDER — METHYLPREDNISOLONE ACETATE 40 MG/ML IJ SUSP
40.0000 mg | Freq: Once | INTRAMUSCULAR | Status: AC
  Administered 2023-09-29: 40 mg via INTRA_ARTICULAR

## 2023-09-29 NOTE — Progress Notes (Signed)
  Intake history:  BP 136/81   Pulse 73   Ht 5' 2 (1.575 m)   Wt 137 lb (62.1 kg)   BMI 25.06 kg/m  Body mass index is 25.06 kg/m.    WHAT ARE WE SEEING YOU FOR TODAY?   right shoulder  How long has this bothered you? (DOI?DOS?WS?)  3 week(s) ago  Anticoag.  No  Diabetes No  Heart disease Yes  Hypertension No  SMOKING HX No  Kidney disease No  Any ALLERGIES ____________ Allergies  Allergen Reactions   Bee Venom Swelling    Causes bad swelling. No epipen   needed   __________________________________   Treatment:  Have you taken:  Tylenol  No  Advil No  Had PT No  Had injection No  Other  ___________________taking pain meds/ Hydrocodone  ______

## 2023-09-29 NOTE — Progress Notes (Addendum)
   Chief Complaint  Patient presents with   Shoulder Pain    Right     63 year old female status post cervical fusion by Dr. Steffanie in [place] presented today with recurrent right shoulder pain.  Previously seen in 2021 by Dr. Brenna.  She had been previously seen for her left shoulder and eventually had surgery.  Comes in today with right shoulder pain for the last 3 weeks.  The pain is actually in the anterior chest wall and radiates towards the shoulder  She does have some mild pain in the shoulder joint anteriorly  Examination reveals no tenderness around the shoulder joint she has active abduction to 90 flexion to 150 without cuff weakness she also has tenderness on the right side of her cervical spine and into the area of the clavicle and upper chest wall  X-rays were done  DG Shoulder Right Result Date: 09/29/2023 Right shoulder x-ray Last x-ray 2021 X-ray today for chronic pain Slight 3 to 5 mm proximal to its normal resting position, osteophyte inferior glenoid. Impression probable rotator cuff arthropathy     Assessment Recurrent shoulder pain probably impingement syndrome  Encounter Diagnoses  Name Primary?   Chronic right shoulder pain Yes   Rotator cuff syndrome of right shoulder     Procedure note the subacromial injection shoulder RIGHT    Verbal consent was obtained to inject the  RIGHT   Shoulder  Timeout was completed to confirm the injection site is a subacromial space of the  RIGHT  shoulder   Medication used Depo-Medrol  40 mg and lidocaine  1% 3 cc  Anesthesia was provided by ethyl chloride  The injection was performed in the RIGHT  posterior subacromial space. After pinning the skin with alcohol and anesthetized the skin with ethyl chloride the subacromial space was injected using a 20-gauge needle. There were no complications  Sterile dressing was applied.    I think she has some C4 type symptoms in the chest area and upper clavicle region which  are more likely related to her cervical fusion  I would like to see her in 3 weeks at that time we can determine if MRI of the shoulder is necessary Encounter Diagnosis  Name Primary?   Chronic right shoulder pain Yes

## 2023-09-29 NOTE — Patient Instructions (Signed)
 SABRA

## 2023-10-20 ENCOUNTER — Other Ambulatory Visit (INDEPENDENT_AMBULATORY_CARE_PROVIDER_SITE_OTHER): Payer: Self-pay

## 2023-10-20 ENCOUNTER — Ambulatory Visit: Admitting: Orthopedic Surgery

## 2023-10-20 DIAGNOSIS — M75101 Unspecified rotator cuff tear or rupture of right shoulder, not specified as traumatic: Secondary | ICD-10-CM

## 2023-10-20 DIAGNOSIS — Z9889 Other specified postprocedural states: Secondary | ICD-10-CM

## 2023-10-20 DIAGNOSIS — M542 Cervicalgia: Secondary | ICD-10-CM

## 2023-10-20 DIAGNOSIS — Z981 Arthrodesis status: Secondary | ICD-10-CM

## 2023-10-20 NOTE — Progress Notes (Signed)
   There were no vitals taken for this visit.  There is no height or weight on file to calculate BMI.  Chief Complaint  Patient presents with   Shoulder Pain    Right- pain across the front and goes into neck has trouble turning neck side to side and up and down    No diagnosis found.  DOI/DOS/ Date: cervical fusion in 2021  Worse

## 2023-10-20 NOTE — Patient Instructions (Signed)
 Referral has been placed to [facility] rehab. They willl call to schedule appt.   Follow  up in 6 weeks with Dr. Margrette

## 2023-10-20 NOTE — Progress Notes (Addendum)
  Chief Complaint  Patient presents with   Shoulder Pain    Right- pain across the front and goes into neck has trouble turning neck side to side and up and down    Encounter Diagnoses  Name Primary?   Rotator cuff syndrome of right shoulder Yes   S/P cervical spinal fusion    Neck pain with history of cervical spinal surgery    Cervicalgia     DOI/DOS/ Date: cervical fusion in 2021  Alyssa Poole says she is worth after the injection in the shoulder.  She has pain in the clavicular area anterior chest wall paracervical musculature of the spine increased pain with flexion extension of the spine  C-spine films were done  She just had a CT July 24, 2023 secondary to a fall   IMPRESSION: 1. No acute intracranial process. 2. 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 3. No acute facial bone fracture. 4. Soft tissue edema overlying the anterior mid mandible. 5. No acute fracture or traumatic subluxation of the cervical spine. 6. Anterior fusion plate at R4-R3 without evidence for hardware complication. 7. Degenerative changes of the cervical spine.     Electronically Signed   By: Greig Pique M.D.   On: 07/24/2023 19:10  DG Cervical Spine 2 or 3 views Result Date: 10/20/2023 X-ray report Chief complaint status post cervical fusion fell in April chest wall supraclavicular and right Images AP lateral C-spine a metal plate is noted between the 5th and 6th cervical vertebrae there is retrosubluxation C4 on C5 there are some degenerative changes on the AP view above the fusion Reading: a metal plate is noted between the 5th and 6th cervical vertebrae there is retrosubluxation C4 on C5 there are some degenerative changes on the AP view above the fusion Impression: Cervical fusion C5 and 6 with retrolisthesis of C4 on 5    Assessment and plan  Encounter Diagnoses  Name Primary?   Rotator cuff syndrome of right shoulder Yes   S/P cervical spinal fusion     Neck pain with history of cervical spinal surgery    Cervicalgia      I think she may have irritated the C4 or C5 nerve.  The dermatomal distribution of pain seems to correlate with injury to this area  Really unclear at this point but I recommend physical therapy  Return in 4 to 6 weeks to recheck

## 2023-10-27 ENCOUNTER — Ambulatory Visit (INDEPENDENT_AMBULATORY_CARE_PROVIDER_SITE_OTHER): Admitting: Orthopedic Surgery

## 2023-10-27 ENCOUNTER — Encounter: Payer: Self-pay | Admitting: Orthopedic Surgery

## 2023-10-27 DIAGNOSIS — S52571D Other intraarticular fracture of lower end of right radius, subsequent encounter for closed fracture with routine healing: Secondary | ICD-10-CM

## 2023-10-27 NOTE — Progress Notes (Signed)
   There were no vitals taken for this visit.  There is no height or weight on file to calculate BMI.  Chief Complaint  Patient presents with   Wrist Injury    Right wrist 07/24/23    No diagnosis found.  DOI/DOS/ 07/24/23  Improved

## 2023-10-27 NOTE — Progress Notes (Signed)
   Encounter Diagnosis  Name Primary?   Other closed intra-articular fracture of distal end of right radius with routine healing, subsequent encounter4/24/25 Yes      VISIT TYPE: FOLLOW UP   Chief Complaint  Patient presents with   Wrist Injury    Right wrist 07/24/23    Encounter Diagnosis  Name Primary?   Other closed intra-articular fracture of distal end of right radius with routine healing, subsequent encounter4/24/25 Yes    Assessment and Plan: Reassurance  Chief Complaint  Patient presents with   Wrist Injury    Right wrist 07/24/23   Alyssa Poole complained of pain when she tries to push off with her wrist in the extended or dorsiflexed position.    Ortho Exam  She is awake and alert  She is oriented  Appearance was normal  No gross wrist deformity, she does have some swelling around the wrist and some pain when she dorsiflexes the wrist   She can make a fist  She can open the hand to a full 5   Imaging no further imaging images were reviewed fracture healing reasonable alignment  A/P Encounter Diagnosis  Name Primary?   Other closed intra-articular fracture of distal end of right radius with routine healing, subsequent encounter4/24/25 Yes    No orders of the defined types were placed in this encounter.

## 2023-11-07 ENCOUNTER — Encounter (HOSPITAL_COMMUNITY): Payer: Self-pay | Admitting: Internal Medicine

## 2023-11-07 DIAGNOSIS — Z780 Asymptomatic menopausal state: Secondary | ICD-10-CM

## 2023-11-07 DIAGNOSIS — Z122 Encounter for screening for malignant neoplasm of respiratory organs: Secondary | ICD-10-CM

## 2023-11-14 ENCOUNTER — Inpatient Hospital Stay: Attending: Oncology

## 2023-11-14 DIAGNOSIS — E611 Iron deficiency: Secondary | ICD-10-CM | POA: Insufficient documentation

## 2023-11-14 DIAGNOSIS — R768 Other specified abnormal immunological findings in serum: Secondary | ICD-10-CM | POA: Insufficient documentation

## 2023-11-14 DIAGNOSIS — D649 Anemia, unspecified: Secondary | ICD-10-CM | POA: Insufficient documentation

## 2023-11-14 DIAGNOSIS — Z7901 Long term (current) use of anticoagulants: Secondary | ICD-10-CM | POA: Diagnosis not present

## 2023-11-14 DIAGNOSIS — I4891 Unspecified atrial fibrillation: Secondary | ICD-10-CM | POA: Insufficient documentation

## 2023-11-14 DIAGNOSIS — E538 Deficiency of other specified B group vitamins: Secondary | ICD-10-CM | POA: Insufficient documentation

## 2023-11-14 LAB — FERRITIN: Ferritin: 462 ng/mL — ABNORMAL HIGH (ref 11–307)

## 2023-11-14 LAB — CBC WITH DIFFERENTIAL/PLATELET
Abs Immature Granulocytes: 0.03 K/uL (ref 0.00–0.07)
Basophils Absolute: 0.1 K/uL (ref 0.0–0.1)
Basophils Relative: 1 %
Eosinophils Absolute: 0.5 K/uL (ref 0.0–0.5)
Eosinophils Relative: 7 %
HCT: 33.3 % — ABNORMAL LOW (ref 36.0–46.0)
Hemoglobin: 10.7 g/dL — ABNORMAL LOW (ref 12.0–15.0)
Immature Granulocytes: 0 %
Lymphocytes Relative: 18 %
Lymphs Abs: 1.2 K/uL (ref 0.7–4.0)
MCH: 30.7 pg (ref 26.0–34.0)
MCHC: 32.1 g/dL (ref 30.0–36.0)
MCV: 95.7 fL (ref 80.0–100.0)
Monocytes Absolute: 0.7 K/uL (ref 0.1–1.0)
Monocytes Relative: 11 %
Neutro Abs: 4.3 K/uL (ref 1.7–7.7)
Neutrophils Relative %: 63 %
Platelets: 325 K/uL (ref 150–400)
RBC: 3.48 MIL/uL — ABNORMAL LOW (ref 3.87–5.11)
RDW: 13.3 % (ref 11.5–15.5)
WBC: 6.8 K/uL (ref 4.0–10.5)
nRBC: 0 % (ref 0.0–0.2)

## 2023-11-14 LAB — COMPREHENSIVE METABOLIC PANEL WITH GFR
ALT: 25 U/L (ref 0–44)
AST: 29 U/L (ref 15–41)
Albumin: 3.7 g/dL (ref 3.5–5.0)
Alkaline Phosphatase: 51 U/L (ref 38–126)
Anion gap: 9 (ref 5–15)
BUN: 14 mg/dL (ref 8–23)
CO2: 22 mmol/L (ref 22–32)
Calcium: 9 mg/dL (ref 8.9–10.3)
Chloride: 109 mmol/L (ref 98–111)
Creatinine, Ser: 0.87 mg/dL (ref 0.44–1.00)
GFR, Estimated: >60 mL/min (ref >60)
Glucose, Bld: 136 mg/dL — ABNORMAL HIGH (ref 70–99)
Potassium: 3.6 mmol/L (ref 3.5–5.1)
Sodium: 140 mmol/L (ref 135–145)
Total Bilirubin: 0.4 mg/dL (ref 0.0–1.2)
Total Protein: 7.2 g/dL (ref 6.5–8.1)

## 2023-11-14 LAB — VITAMIN B12: Vitamin B-12: 3367 pg/mL — ABNORMAL HIGH (ref 180–914)

## 2023-11-14 LAB — FOLATE: Folate: >40 ng/mL (ref >5.9)

## 2023-11-17 LAB — KAPPA/LAMBDA LIGHT CHAINS
Kappa free light chain: 34.2 mg/L — ABNORMAL HIGH (ref 3.3–19.4)
Kappa, lambda light chain ratio: 1.29 (ref 0.26–1.65)
Lambda free light chains: 26.6 mg/L — ABNORMAL HIGH (ref 5.7–26.3)

## 2023-11-21 ENCOUNTER — Inpatient Hospital Stay: Admitting: Oncology

## 2023-11-21 VITALS — BP 118/79 | HR 59 | Temp 97.9°F | Resp 16 | Wt 134.0 lb

## 2023-11-21 DIAGNOSIS — D649 Anemia, unspecified: Secondary | ICD-10-CM

## 2023-11-21 DIAGNOSIS — I4891 Unspecified atrial fibrillation: Secondary | ICD-10-CM | POA: Diagnosis not present

## 2023-11-21 DIAGNOSIS — R768 Other specified abnormal immunological findings in serum: Secondary | ICD-10-CM

## 2023-11-21 NOTE — Progress Notes (Signed)
 [facility] OFFICE PROGRESS NOTE  Center, [facility]  ASSESSMENT & PLAN:    Assessment & Plan Anemia, unspecified type Anemia likely multifactorial at this time.   Iron  and B12 deficiency previously noted.  Started on oral supplementation SPEP showed no M spike.   Slight elevation of kappa free light chains. She received 2 doses of 40 mg IV Venofer  on 09/02/2023 and 09/09/2023.  Tolerated in infusion well. She was referred to gastroenterology but has not been seen by them yet. Iron  labs improved with IV iron .  Hemoglobin is 10.7 (10.4). No additional IV iron  needed at this time.  -Continue oral iron .  Stop B12 due to elevated levels. -She would like to hold off on GI referral at this time.  Denies bleeding. - Return to clinic in 4 months with labs a few days before.  Atrial fibrillation, unspecified type [facility] Patient has a history of A-fib and is not on anticoagulation.  Reports occasional fatigue due to A-fib -Continue to follow with cardiology Elevated serum immunoglobulin free light chains Patient has slightly elevated kappa free light chains with normal FLC ratio.  SPEP: No M spike Normal kidney function.   Autoimmune workup: Negative  - Will monitor for now, will consider bone marrow biopsy if anemia is unresolved and free light chains continues to be elevated. - Discussed possible bone marrow biopsy should her hemoglobin not improve despite adequate iron .  She is not interested in a bone marrow biopsy at this time.  We will continue to monitor and advise her as needed.  Orders Placed This Encounter  Procedures   Kappa/lambda light chains    Standing Status:   Future    Expected Date:   03/22/2024    Expiration Date:   06/20/2024   Comprehensive metabolic panel with GFR    Standing Status:   Future    Expected Date:   03/22/2024    Expiration Date:   06/20/2024   CBC with Differential/Platelet    Standing Status:   Future    Expected Date:    03/22/2024    Expiration Date:   06/20/2024   Vitamin B12    Standing Status:   Future    Expected Date:   03/22/2024    Expiration Date:   06/20/2024   Folate    Standing Status:   Future    Expected Date:   03/22/2024    Expiration Date:   06/20/2024   Ferritin    Standing Status:   Future    Expected Date:   03/22/2024    Expiration Date:   06/20/2024    INTERVAL HISTORY: Patient returns for follow-up.  She denies any hospitalizations, surgeries or changes to her baseline health.  Denies any new concerns at this time.  Reports she tolerated iron  infusions well.  We reviewed labs from 11/14/2023 which included kappa lambda light chains, CMP, CBC, vitamin B12, folate and ferritin.  SUMMARY OF HEMATOLOGIC HISTORY: Oncology History   No history exists.     CBC    Component Value Date/Time   WBC 6.8 11/14/2023 0942   RBC 3.48 (L) 11/14/2023 0942   HGB 10.7 (L) 11/14/2023 0942   HCT 33.3 (L) 11/14/2023 0942   PLT 325 11/14/2023 0942   MCV 95.7 11/14/2023 0942   MCH 30.7 11/14/2023 0942   MCHC 32.1 11/14/2023 0942   RDW 13.3 11/14/2023 0942   LYMPHSABS 1.2 11/14/2023 0942   MONOABS 0.7 11/14/2023 0942   EOSABS 0.5 11/14/2023 9057  BASOSABS 0.1 11/14/2023 0942       Latest Ref Rng & Units 11/14/2023    9:42 AM 08/05/2023   10:59 AM 04/10/2023   10:00 AM  CMP  Glucose 70 - 99 mg/dL 863  886  87   BUN 8 - 23 mg/dL 14  12  17    Creatinine 0.44 - 1.00 mg/dL 9.12  9.13  9.11   Sodium 135 - 145 mmol/L 140  137  136   Potassium 3.5 - 5.1 mmol/L 3.6  3.6  3.4   Chloride 98 - 111 mmol/L 109  104  104   CO2 22 - 32 mmol/L 22  23  23    Calcium  8.9 - 10.3 mg/dL 9.0  9.3  8.7   Total Protein 6.5 - 8.1 g/dL 7.2  7.5  7.3   Total Bilirubin 0.0 - 1.2 mg/dL 0.4  0.4  0.4   Alkaline Phos 38 - 126 U/L 51  59  44   AST 15 - 41 U/L 29  28  25    ALT 0 - 44 U/L 25  20  21       Lab Results  Component Value Date   FERRITIN 462 (H) 11/14/2023   VITAMINB12 3,367 (H) 11/14/2023     Vitals:   11/21/23 0952  BP: 118/79  Pulse: (!) 59  Resp: 16  Temp: 97.9 F (36.6 C)  SpO2: 100%    Review of System:  Review of Systems  Constitutional: Negative.  Negative for chills, fever, malaise/fatigue and weight loss.  HENT:  Negative for congestion, ear pain and tinnitus.   Eyes: Negative.  Negative for blurred vision and double vision.  Respiratory: Negative.  Negative for cough, sputum production and shortness of breath.   Cardiovascular: Negative.  Negative for chest pain, palpitations and leg swelling.  Gastrointestinal: Negative.  Negative for abdominal pain, constipation, diarrhea, nausea and vomiting.  Genitourinary:  Negative for dysuria, frequency and urgency.  Musculoskeletal:  Negative for back pain and falls.  Skin: Negative.  Negative for rash.  Neurological: Negative.  Negative for weakness and headaches.  Endo/Heme/Allergies: Negative.  Does not bruise/bleed easily.  Psychiatric/Behavioral: Negative.  Negative for depression. The patient is not nervous/anxious and does not have insomnia.     Physical Exam: Physical Exam Constitutional:      Appearance: Normal appearance.  HENT:     Head: Normocephalic and atraumatic.  Eyes:     Pupils: Pupils are equal, round, and reactive to light.  Cardiovascular:     Rate and Rhythm: Normal rate and regular rhythm.     Heart sounds: Normal heart sounds. No murmur heard. Pulmonary:     Effort: Pulmonary effort is normal.     Breath sounds: Normal breath sounds. No wheezing.  Abdominal:     General: Bowel sounds are normal. There is no distension.     Palpations: Abdomen is soft.     Tenderness: There is no abdominal tenderness.  Musculoskeletal:        General: Normal range of motion.     Cervical back: Normal range of motion.  Skin:    General: Skin is warm and dry.     Findings: No rash.  Neurological:     Mental Status: She is alert and oriented to person, place, and time.     Gait: Gait is  intact.  Psychiatric:        Mood and Affect: Mood and affect normal.        Cognition and  Memory: Memory normal.        Judgment: Judgment normal.      I spent 20 minutes dedicated to the care of this patient (face-to-face and non-face-to-face) on the date of the encounter to include what is described in the assessment and plan.,  Delon Hope, NP 11/21/2023 10:08 AM

## 2023-11-21 NOTE — Assessment & Plan Note (Addendum)
 Anemia likely multifactorial at this time.   Iron  and B12 deficiency previously noted.  Started on oral supplementation SPEP showed no M spike.   Slight elevation of kappa free light chains. She received 2 doses of 40 mg IV Venofer  on 09/02/2023 and 09/09/2023.  Tolerated in infusion well. She was referred to gastroenterology but has not been seen by them yet. Iron  labs improved with IV iron .  Hemoglobin is 10.7 (10.4). No additional IV iron  needed at this time.  -Continue oral iron .  Stop B12 due to elevated levels. -She would like to hold off on GI referral at this time.  Denies bleeding. - Return to clinic in 4 months with labs a few days before.

## 2023-11-21 NOTE — Assessment & Plan Note (Addendum)
 Patient has slightly elevated kappa free light chains with normal FLC ratio.  SPEP: No M spike Normal kidney function.   Autoimmune workup: Negative  - Will monitor for now, will consider bone marrow biopsy if anemia is unresolved and free light chains continues to be elevated. - Discussed possible bone marrow biopsy should her hemoglobin not improve despite adequate iron .  She is not interested in a bone marrow biopsy at this time.  We will continue to monitor and advise her as needed.

## 2023-11-21 NOTE — Assessment & Plan Note (Addendum)
 Patient has a history of A-fib and is not on anticoagulation.  Reports occasional fatigue due to A-fib -Continue to follow with cardiology

## 2023-12-03 ENCOUNTER — Ambulatory Visit (HOSPITAL_COMMUNITY)

## 2023-12-03 ENCOUNTER — Telehealth (HOSPITAL_COMMUNITY): Payer: Self-pay

## 2023-12-03 NOTE — Telephone Encounter (Signed)
 Pt was called concerning her missed evaluation appointment this morning. Pt states she is no longer dealing with any shoulder or neck pain at this time and does not wish to proceed with therapy as she has a doctors appointment tomorrow and does not want to do just one day of therapy. Pt to be taken off of evaluation list, front desk notified.  Lang Ada, PT, DPT [facility] Office: [phone] 10:31 AM, 12/03/23

## 2023-12-04 ENCOUNTER — Ambulatory Visit: Admitting: Orthopedic Surgery

## 2023-12-09 NOTE — Progress Notes (Deleted)
 63 y.o. G86P1001 female here for annual exam. Married.  No LMP recorded. Patient is postmenopausal.    She reports ***. Urine sample provided: ***  Abnormal bleeding: *** Pelvic discharge or pain: *** Breast mass, nipple discharge or skin changes : ***  Sexually active: *** Birth control: *** Last PAP: No results found for: DIAGPAP, HPVHIGH, ADEQPAP Last mammogram: *** *** Last colonoscopy: *** ***  Exercising: *** Smoker: ***  Flowsheet Row Office Visit from 11/21/2023 in [facility]  PHQ-2 Total Score 0       GYN HISTORY: ***  OB History  Gravida Para Term Preterm AB Living  1 1 1   1   SAB IAB Ectopic Multiple Live Births          # Outcome Date GA Lbr Len/2nd Weight Sex Type Anes PTL Lv  1 Term            Past Medical History:  Diagnosis Date   Anxiety    Arthritis    Asthma    Atrial fibrillation (HCC)    Chronic headaches    Chronic neck pain    Depression 2002   Dysconjugate gaze    GERD (gastroesophageal reflux disease)    Hypertension    Neuromuscular disorder (HCC)    Osteoporosis    Panic attacks 2002   Stroke [facility])    Past Surgical History:  Procedure Laterality Date   BACK SURGERY     x4   CERVICAL SPINE SURGERY     plate in neck   CHOLECYSTECTOMY     KNEE ARTHROSCOPY WITH MEDIAL MENISECTOMY Left 03/09/2014   Procedure: LEFT KNEE ARTHROSCOPY WITH PARTIAL MEDIAL MENISECTOMY;  Surgeon: Taft FORBES Minerva, MD;  Location: [facility];  Service: Orthopedics;  Laterality: Left;   SHOULDER ARTHROSCOPY Left 11/14/2016   Procedure: ARTHROSCOPY SHOULDER WITH LIMITED DEDBRIDEMENT AND ACROMIOPLASTY AND DISTAL CLAVICAL EXCISION;  Surgeon: Minerva Taft FORBES, MD;  Location: [facility];  Service: Orthopedics;  Laterality: Left;   TOTAL KNEE ARTHROPLASTY Left 09/14/2014   Procedure: LEFT TOTAL KNEE ARTHROPLASTY;  Surgeon: Taft FORBES Minerva, MD;  Location: [facility];  Service: Orthopedics;  Laterality:  Left;   Current Outpatient Medications on File Prior to Visit  Medication Sig Dispense Refill   alendronate (FOSAMAX) 70 MG tablet Take 70 mg by mouth every 7 (seven) days. Takes on Sunday. Take with a full glass of water on an empty stomach.     allopurinol (ZYLOPRIM) 300 MG tablet Take 300 mg by mouth daily.     aspirin  EC 81 MG tablet Take 81 mg by mouth daily. Swallow whole.     atorvastatin  (LIPITOR) 10 MG tablet Take 10 mg by mouth at bedtime.     baclofen  (LIORESAL ) 10 MG tablet Take 10 mg by mouth 3 (three) times daily. Takes as needed     calcium  carbonate (OSCAL) 1500 (600 Ca) MG TABS tablet Take 1 tablet by mouth daily.     [employer] CALCIUM  600+D 600-800 MG-UNIT TABS Take 1 tablet by mouth daily.  11   cyanocobalamin  (VITAMIN B12) 1000 MCG tablet TAKE 1 TABLET EVERY DAY 90 tablet 3   escitalopram  (LEXAPRO ) 20 MG tablet Take 1 tablet by mouth daily.     Ferrous Sulfate  (IRON  PO) Take 1 tablet by mouth daily.     ferrous sulfate  325 (65 FE) MG EC tablet Take 1 tablet (325 mg total) by mouth every other day. 45 tablet  3   HYDROcodone -acetaminophen  (NORCO) 10-325 MG tablet Take 1 tablet by mouth every 6 (six) hours as needed. 30 tablet 0   magnesium  oxide (MAGOX 400) 400 (241.3 Mg) MG tablet Take 1 tablet (400 mg total) by mouth daily. 30 tablet 1   naproxen (NAPROSYN) 500 MG tablet Take 500 mg by mouth daily.     ondansetron  (ZOFRAN ) 4 MG tablet Take 1 tablet (4 mg total) by mouth every 6 (six) hours. 12 tablet 0   pantoprazole  (PROTONIX ) 40 MG tablet Take 1 tablet (40 mg total) by mouth 2 (two) times daily. 60 tablet 2   potassium chloride  SA (KLOR-CON ) 20 MEQ tablet Take 1 tablet (20 mEq total) by mouth daily. 30 tablet 1   pregabalin  (LYRICA ) 75 MG capsule Take 75 mg by mouth 3 (three) times daily.     sotalol  (BETAPACE ) 120 MG tablet Take 0.5 tablets (60 mg total) by mouth daily.     topiramate  (TOPAMAX ) 50 MG tablet Take 50 mg by mouth daily.     No current facility-administered  medications on file prior to visit.   Social History   Socioeconomic History   Marital status: Married    Spouse name: Not on file   Number of children: Not on file   Years of education: Not on file   Highest education level: Not on file  Occupational History   Not on file  Tobacco Use   Smoking status: Former    Current packs/day: 1.00    Average packs/day: 1 pack/day for 41.0 years (41.0 ttl pk-yrs)    Types: Cigarettes   Smokeless tobacco: Never   Tobacco comments:    quit when she had stroke   Vaping Use   Vaping status: Former  Substance and Sexual Activity   Alcohol use: No   Drug use: No   Sexual activity: Not Currently    Birth control/protection: None  Other Topics Concern   Not on file  Social History Narrative   Not on file   Social Drivers of Health   Financial Resource Strain: Not on file  Food Insecurity: No Food Insecurity (01/23/2023)   Hunger Vital Sign    Worried About Running Out of Food in the Last Year: Never true    Ran Out of Food in the Last Year: Never true  Transportation Needs: No Transportation Needs (01/23/2023)   PRAPARE - Administrator, Civil Service (Medical): No    Lack of Transportation (Non-Medical): No  Physical Activity: Not on file  Stress: Not on file  Social Connections: Not on file  Intimate Partner Violence: Not At Risk (10/16/2022)   Humiliation, Afraid, Rape, and Kick questionnaire    Fear of Current or Ex-Partner: No    Emotionally Abused: No    Physically Abused: No    Sexually Abused: No   Family History  Problem Relation Age of Onset   Heart attack Mother    Arthritis Mother    Heart attack Father    Cancer Father    Diabetes Father    Allergies  Allergen Reactions   Bee Venom Swelling    Causes bad swelling. No epipen   needed     PE There were no vitals filed for this visit. There is no height or weight on file to calculate BMI.  Physical Exam    Assessment and Plan:        There are  no diagnoses linked to this encounter. Clotilda FORBES Pa, CMA

## 2023-12-11 ENCOUNTER — Encounter: Admitting: Obstetrics and Gynecology

## 2024-02-10 ENCOUNTER — Encounter: Admitting: Obstetrics and Gynecology

## 2024-02-25 ENCOUNTER — Encounter: Payer: Self-pay | Admitting: *Deleted

## 2024-03-29 ENCOUNTER — Encounter: Payer: Self-pay | Admitting: *Deleted

## 2024-03-30 ENCOUNTER — Inpatient Hospital Stay: Attending: Oncology

## 2024-03-30 DIAGNOSIS — R7689 Other specified abnormal immunological findings in serum: Secondary | ICD-10-CM | POA: Insufficient documentation

## 2024-03-30 DIAGNOSIS — D649 Anemia, unspecified: Secondary | ICD-10-CM | POA: Insufficient documentation

## 2024-03-30 LAB — CBC WITH DIFFERENTIAL/PLATELET
Abs Immature Granulocytes: 0.02 K/uL (ref 0.00–0.07)
Basophils Absolute: 0.1 K/uL (ref 0.0–0.1)
Basophils Relative: 1 %
Eosinophils Absolute: 0.5 K/uL (ref 0.0–0.5)
Eosinophils Relative: 7 %
HCT: 34.4 % — ABNORMAL LOW (ref 36.0–46.0)
Hemoglobin: 11.5 g/dL — ABNORMAL LOW (ref 12.0–15.0)
Immature Granulocytes: 0 %
Lymphocytes Relative: 21 %
Lymphs Abs: 1.4 K/uL (ref 0.7–4.0)
MCH: 32.4 pg (ref 26.0–34.0)
MCHC: 33.4 g/dL (ref 30.0–36.0)
MCV: 96.9 fL (ref 80.0–100.0)
Monocytes Absolute: 0.7 K/uL (ref 0.1–1.0)
Monocytes Relative: 10 %
Neutro Abs: 4.2 K/uL (ref 1.7–7.7)
Neutrophils Relative %: 61 %
Platelets: 286 K/uL (ref 150–400)
RBC: 3.55 MIL/uL — ABNORMAL LOW (ref 3.87–5.11)
RDW: 12.6 % (ref 11.5–15.5)
WBC: 6.9 K/uL (ref 4.0–10.5)
nRBC: 0 % (ref 0.0–0.2)

## 2024-03-30 LAB — COMPREHENSIVE METABOLIC PANEL WITH GFR
ALT: 14 U/L (ref 0–44)
AST: 27 U/L (ref 15–41)
Albumin: 4.5 g/dL (ref 3.5–5.0)
Alkaline Phosphatase: 70 U/L (ref 38–126)
Anion gap: 13 (ref 5–15)
BUN: 12 mg/dL (ref 8–23)
CO2: 21 mmol/L — ABNORMAL LOW (ref 22–32)
Calcium: 9.1 mg/dL (ref 8.9–10.3)
Chloride: 109 mmol/L (ref 98–111)
Creatinine, Ser: 0.69 mg/dL (ref 0.44–1.00)
GFR, Estimated: >60 mL/min
Glucose, Bld: 104 mg/dL — ABNORMAL HIGH (ref 70–99)
Potassium: 4 mmol/L (ref 3.5–5.1)
Sodium: 144 mmol/L (ref 135–145)
Total Bilirubin: 0.2 mg/dL (ref 0.0–1.2)
Total Protein: 7.4 g/dL (ref 6.5–8.1)

## 2024-03-30 LAB — FERRITIN: Ferritin: 632 ng/mL — ABNORMAL HIGH (ref 11–307)

## 2024-03-30 LAB — FOLATE: Folate: >20 ng/mL

## 2024-03-30 LAB — VITAMIN B12: Vitamin B-12: 1165 pg/mL — ABNORMAL HIGH (ref 180–914)

## 2024-03-31 LAB — KAPPA/LAMBDA LIGHT CHAINS
Kappa free light chain: 35.6 mg/L — ABNORMAL HIGH (ref 3.3–19.4)
Kappa, lambda light chain ratio: 1.34 (ref 0.26–1.65)
Lambda free light chains: 26.5 mg/L — ABNORMAL HIGH (ref 5.7–26.3)

## 2024-04-06 ENCOUNTER — Inpatient Hospital Stay: Admitting: Oncology

## 2024-04-13 ENCOUNTER — Inpatient Hospital Stay: Attending: Oncology | Admitting: Oncology

## 2024-04-13 VITALS — BP 98/66 | HR 60 | Temp 98.6°F | Resp 16 | Wt 131.4 lb

## 2024-04-13 DIAGNOSIS — I4891 Unspecified atrial fibrillation: Secondary | ICD-10-CM | POA: Diagnosis not present

## 2024-04-13 DIAGNOSIS — D508 Other iron deficiency anemias: Secondary | ICD-10-CM

## 2024-04-13 DIAGNOSIS — D509 Iron deficiency anemia, unspecified: Secondary | ICD-10-CM | POA: Diagnosis present

## 2024-04-13 DIAGNOSIS — R7689 Other specified abnormal immunological findings in serum: Secondary | ICD-10-CM | POA: Diagnosis not present

## 2024-04-13 NOTE — Assessment & Plan Note (Addendum)
 Anemia likely multifactorial at this time.   SPEP showed no M spike.   Slight elevation of kappa free light chains. She received 2 doses of 40 mg IV Venofer  on 09/02/2023 and 09/09/2023.  Tolerated in infusion well. She was referred to gastroenterology but has not been seen by them yet. Iron  labs improved with IV iron .  She is currently taking oral iron  every other day with vitamin C. Most recent hemoglobin is 11.5 and ferritin is 632.  No recent iron  panel. No additional IV iron  needed at this time.  -Continue oral iron .  Stop B12 due to elevated levels. -She would like to hold off on GI referral at this time.  Denies bleeding. - Return to clinic in 4 months with labs a few days before.

## 2024-04-13 NOTE — Assessment & Plan Note (Addendum)
 Patient has slightly elevated kappa free light chains with normal FLC ratio.  SPEP: No M spike. Elevated kappa and lambda free light chains but normal kappa lambda ratio. Normal kidney function.   Autoimmune workup: Negative Repeat SPEP, light chains and IFE every 6 months.  - Will monitor for now, will consider bone marrow biopsy if anemia is unresolved and free light chains continues to be elevated. - Discussed possible bone marrow biopsy should her hemoglobin not improve despite adequate iron .  She is not interested in a bone marrow biopsy at this time.  We will continue to monitor and advise her as needed.

## 2024-04-13 NOTE — Assessment & Plan Note (Addendum)
 Patient has a history of A-fib and is not on anticoagulation.  Reports occasional fatigue due to A-fib -Continue to follow with cardiology

## 2024-04-13 NOTE — Progress Notes (Signed)
 "  [facility] OFFICE PROGRESS NOTE  Center, [facility]  ASSESSMENT & PLAN:    Assessment & Plan Other iron  deficiency anemia Anemia likely multifactorial at this time.   SPEP showed no M spike.   Slight elevation of kappa free light chains. She received 2 doses of 40 mg IV Venofer  on 09/02/2023 and 09/09/2023.  Tolerated in infusion well. She was referred to gastroenterology but has not been seen by them yet. Iron  labs improved with IV iron .  She is currently taking oral iron  every other day with vitamin C. Most recent hemoglobin is 11.5 and ferritin is 632.  No recent iron  panel. No additional IV iron  needed at this time.  -Continue oral iron .  Stop B12 due to elevated levels. -She would like to hold off on GI referral at this time.  Denies bleeding. - Return to clinic in 4 months with labs a few days before.  Atrial fibrillation, unspecified type [facility]) Patient has a history of A-fib and is not on anticoagulation.  Reports occasional fatigue due to A-fib -Continue to follow with cardiology Elevated serum immunoglobulin free light chains Patient has slightly elevated kappa free light chains with normal FLC ratio.  SPEP: No M spike. Elevated kappa and lambda free light chains but normal kappa lambda ratio. Normal kidney function.   Autoimmune workup: Negative Repeat SPEP, light chains and IFE every 6 months.  - Will monitor for now, will consider bone marrow biopsy if anemia is unresolved and free light chains continues to be elevated. - Discussed possible bone marrow biopsy should her hemoglobin not improve despite adequate iron .  She is not interested in a bone marrow biopsy at this time.  We will continue to monitor and advise her as needed.  Orders Placed This Encounter  Procedures   Ferritin    Standing Status:   Future    Expected Date:   07/12/2024    Expiration Date:   04/13/2025   Folate    Standing Status:   Future    Expected Date:   08/20/2024     Expiration Date:   11/11/2024   Vitamin B12    Standing Status:   Future    Expected Date:   08/20/2024    Expiration Date:   11/11/2024   CBC with Differential/Platelet    Standing Status:   Future    Expected Date:   08/20/2024    Expiration Date:   11/11/2024   Comprehensive metabolic panel with GFR    Standing Status:   Future    Expected Date:   08/20/2024    Expiration Date:   11/11/2024   Kappa/lambda light chains    Standing Status:   Future    Expected Date:   08/20/2024    Expiration Date:   11/11/2024   Iron  and TIBC ([facility] ONLY)    Standing Status:   Future    Expected Date:   07/12/2024    Expiration Date:   04/13/2025    INTERVAL HISTORY: Patient returns for follow-up.  She is here with her husband.  Patient received 2 doses of IV Venofer  on 09/02/2023 and 09/09/23.  Reports she has been taking iron  supplements every other day with vitamin C as instructed.  Reports occasionally her stools are hard but otherwise no bright red blood per rectum, melena or hematochezia.  She denies any hospitalizations, surgeries or changes to her baseline health.  Denies any new concerns at this time.  Reports she tolerated iron   infusions well.  Reports she is followed by GYN for complete uterine prolapse and has pessary in place.  She was evaluated in the summer and given Flagyl  for bacterial vaginosis.  She was referred for physical therapy for pelvic floor.  Reports she takes out her pessary and cleans this twice per week.  It does cause significant drainage/discharge that is white not odorous along with a little bit of blood.  Reports she needs to call and schedule another appointment.  No signs of infection.  We reviewed labs from 03/30/2024 which included kappa lambda light chains, CMP, CBC, vitamin B12, folate and ferritin.  SUMMARY OF HEMATOLOGIC HISTORY: Oncology History   No problem history exists.     CBC    Component Value Date/Time   WBC 6.9 03/30/2024 1041    RBC 3.55 (L) 03/30/2024 1041   HGB 11.5 (L) 03/30/2024 1041   HCT 34.4 (L) 03/30/2024 1041   PLT 286 03/30/2024 1041   MCV 96.9 03/30/2024 1041   MCH 32.4 03/30/2024 1041   MCHC 33.4 03/30/2024 1041   RDW 12.6 03/30/2024 1041   LYMPHSABS 1.4 03/30/2024 1041   MONOABS 0.7 03/30/2024 1041   EOSABS 0.5 03/30/2024 1041   BASOSABS 0.1 03/30/2024 1041       Latest Ref Rng & Units 03/30/2024   10:41 AM 11/14/2023    9:42 AM 08/05/2023   10:59 AM  CMP  Glucose 70 - 99 mg/dL 895  863  886   BUN 8 - 23 mg/dL 12  14  12    Creatinine 0.44 - 1.00 mg/dL 9.30  9.12  9.13   Sodium 135 - 145 mmol/L 144  140  137   Potassium 3.5 - 5.1 mmol/L 4.0  3.6  3.6   Chloride 98 - 111 mmol/L 109  109  104   CO2 22 - 32 mmol/L 21  22  23    Calcium  8.9 - 10.3 mg/dL 9.1  9.0  9.3   Total Protein 6.5 - 8.1 g/dL 7.4  7.2  7.5   Total Bilirubin 0.0 - 1.2 mg/dL 0.2  0.4  0.4   Alkaline Phos 38 - 126 U/L 70  51  59   AST 15 - 41 U/L 27  29  28    ALT 0 - 44 U/L 14  25  20       Lab Results  Component Value Date   FERRITIN 632 (H) 03/30/2024   VITAMINB12 1,165 (H) 03/30/2024    Vitals:   04/13/24 1339  BP: 98/66  Pulse: 60  Resp: 16  Temp: 98.6 F (37 C)  SpO2: 99%     Review of System:  Review of Systems  Constitutional:  Negative for malaise/fatigue.  Respiratory:  Positive for cough.   Gastrointestinal:  Positive for constipation. Negative for abdominal pain, blood in stool, diarrhea, heartburn and vomiting.  Neurological:  Positive for headaches.    Physical Exam: Physical Exam Constitutional:      Appearance: Normal appearance.  HENT:     Head: Normocephalic and atraumatic.  Eyes:     Pupils: Pupils are equal, round, and reactive to light.  Cardiovascular:     Rate and Rhythm: Normal rate and regular rhythm.     Heart sounds: Normal heart sounds. No murmur heard. Pulmonary:     Effort: Pulmonary effort is normal.     Breath sounds: Normal breath sounds. No wheezing.  Abdominal:      General: Bowel sounds are normal. There is no distension.  Palpations: Abdomen is soft.     Tenderness: There is no abdominal tenderness.  Musculoskeletal:        General: Normal range of motion.     Cervical back: Normal range of motion.  Skin:    General: Skin is warm and dry.     Findings: No rash.  Neurological:     Mental Status: She is alert and oriented to person, place, and time.     Gait: Gait is intact.  Psychiatric:        Mood and Affect: Mood and affect normal.        Cognition and Memory: Memory normal.        Judgment: Judgment normal.      I spent 20 minutes dedicated to the care of this patient (face-to-face and non-face-to-face) on the date of the encounter to include what is described in the assessment and plan.,  Delon Hope, NP 04/13/2024 2:03 PM "

## 2024-07-20 ENCOUNTER — Inpatient Hospital Stay

## 2024-07-27 ENCOUNTER — Inpatient Hospital Stay: Admitting: Oncology
